# Patient Record
Sex: Male | Born: 1940
Health system: Southern US, Community
[De-identification: ages and names within clinical notes are randomized; demographics above are authoritative.]

## PROBLEM LIST (undated history)

## (undated) DIAGNOSIS — I10 Essential (primary) hypertension: Secondary | ICD-10-CM

## (undated) DIAGNOSIS — E6609 Other obesity due to excess calories: Secondary | ICD-10-CM

## (undated) DIAGNOSIS — E11319 Type 2 diabetes mellitus with unspecified diabetic retinopathy without macular edema: Secondary | ICD-10-CM

## (undated) DIAGNOSIS — Q2112 Patent foramen ovale: Secondary | ICD-10-CM

## (undated) DIAGNOSIS — Z8679 Personal history of other diseases of the circulatory system: Secondary | ICD-10-CM

## (undated) DIAGNOSIS — I350 Nonrheumatic aortic (valve) stenosis: Principal | ICD-10-CM

## (undated) DIAGNOSIS — N200 Calculus of kidney: Secondary | ICD-10-CM

## (undated) DIAGNOSIS — Q211 Atrial septal defect: Secondary | ICD-10-CM

## (undated) DIAGNOSIS — Z953 Presence of xenogenic heart valve: Secondary | ICD-10-CM

## (undated) DIAGNOSIS — E538 Deficiency of other specified B group vitamins: Secondary | ICD-10-CM

## (undated) DIAGNOSIS — A6920 Lyme disease, unspecified: Secondary | ICD-10-CM

## (undated) DIAGNOSIS — K219 Gastro-esophageal reflux disease without esophagitis: Secondary | ICD-10-CM

## (undated) DIAGNOSIS — Z8619 Personal history of other infectious and parasitic diseases: Secondary | ICD-10-CM

## (undated) DIAGNOSIS — N3941 Urge incontinence: Secondary | ICD-10-CM

## (undated) DIAGNOSIS — I4819 Other persistent atrial fibrillation: Secondary | ICD-10-CM

## (undated) DIAGNOSIS — I2510 Atherosclerotic heart disease of native coronary artery without angina pectoris: Secondary | ICD-10-CM

## (undated) DIAGNOSIS — I5032 Chronic diastolic (congestive) heart failure: Secondary | ICD-10-CM

## (undated) DIAGNOSIS — G709 Myoneural disorder, unspecified: Secondary | ICD-10-CM

## (undated) DIAGNOSIS — Z9889 Other specified postprocedural states: Secondary | ICD-10-CM

## (undated) HISTORY — DX: Calculus of kidney: N20.0

## (undated) HISTORY — DX: Deficiency of other specified B group vitamins: E53.8

## (undated) HISTORY — PX: VASECTOMY: SHX75

## (undated) HISTORY — PX: OTHER SURGICAL HISTORY: SHX169

## (undated) HISTORY — DX: Urge incontinence: N39.41

## (undated) HISTORY — DX: Patent foramen ovale: Q21.12

## (undated) HISTORY — DX: Lyme disease, unspecified: A69.20

## (undated) HISTORY — DX: Other obesity due to excess calories: E66.09

## (undated) HISTORY — PX: EYE SURGERY: SHX253

## (undated) HISTORY — DX: Essential (primary) hypertension: I10

## (undated) HISTORY — DX: Other persistent atrial fibrillation: I48.19

## (undated) HISTORY — DX: Type 2 diabetes mellitus with unspecified diabetic retinopathy without macular edema: E11.319

## (undated) HISTORY — PX: BACK SURGERY: SHX140

## (undated) HISTORY — DX: Atrial septal defect: Q21.1

## (undated) HISTORY — DX: Nonrheumatic aortic (valve) stenosis: I35.0

## (undated) HISTORY — DX: Chronic diastolic (congestive) heart failure: I50.32

## (undated) HISTORY — DX: Personal history of other infectious and parasitic diseases: Z86.19

## (undated) HISTORY — PX: COLONOSCOPY: SHX174

---

## 1997-09-04 ENCOUNTER — Other Ambulatory Visit: Admission: RE | Admit: 1997-09-04 | Discharge: 1997-09-04 | Payer: Self-pay | Admitting: Family Medicine

## 1998-09-28 ENCOUNTER — Inpatient Hospital Stay (HOSPITAL_COMMUNITY): Admission: RE | Admit: 1998-09-28 | Discharge: 1998-09-29 | Payer: Self-pay | Admitting: Orthopaedic Surgery

## 2000-09-28 LAB — HM COLONOSCOPY

## 2002-04-20 ENCOUNTER — Encounter: Admission: RE | Admit: 2002-04-20 | Discharge: 2002-04-20 | Payer: Self-pay | Admitting: Family Medicine

## 2002-04-20 ENCOUNTER — Encounter: Payer: Self-pay | Admitting: Family Medicine

## 2004-09-08 ENCOUNTER — Ambulatory Visit: Payer: Self-pay | Admitting: Family Medicine

## 2005-01-31 ENCOUNTER — Ambulatory Visit: Payer: Self-pay | Admitting: Family Medicine

## 2005-07-29 ENCOUNTER — Emergency Department (HOSPITAL_COMMUNITY): Admission: EM | Admit: 2005-07-29 | Discharge: 2005-07-29 | Payer: Self-pay | Admitting: Emergency Medicine

## 2005-10-11 ENCOUNTER — Ambulatory Visit: Payer: Self-pay | Admitting: Family Medicine

## 2006-04-17 ENCOUNTER — Ambulatory Visit: Payer: Self-pay | Admitting: Family Medicine

## 2006-04-17 LAB — CONVERTED CEMR LAB
ALT: 22 units/L (ref 0–40)
AST: 31 units/L (ref 0–37)
BUN: 25 mg/dL — ABNORMAL HIGH (ref 6–23)
CO2: 25 meq/L (ref 19–32)
Calcium: 10.6 mg/dL — ABNORMAL HIGH (ref 8.4–10.5)
Chloride: 109 meq/L (ref 96–112)
Chol/HDL Ratio, serum: 4.5
Cholesterol: 233 mg/dL (ref 0–200)
Creatinine, Ser: 1.1 mg/dL (ref 0.4–1.5)
Creatinine,U: 115.7 mg/dL
GFR calc non Af Amer: 71 mL/min
Glomerular Filtration Rate, Af Am: 86 mL/min/{1.73_m2}
Glucose, Bld: 109 mg/dL — ABNORMAL HIGH (ref 70–99)
HDL: 52.1 mg/dL (ref >39.0)
Hgb A1c MFr Bld: 7.2 % — ABNORMAL HIGH (ref 4.6–6.0)
LDL DIRECT: 173.7 mg/dL
Microalb Creat Ratio: 10.4 mg/g (ref 0.0–30.0)
Microalb, Ur: 1.2 mg/dL (ref 0.0–1.9)
PSA: 1.12 ng/mL (ref 0.10–4.00)
Potassium: 5.3 meq/L — ABNORMAL HIGH (ref 3.5–5.1)
Rheumatoid Fact: <20 intl units/mL — ABNORMAL LOW (ref 0.0–20.0)
Sed Rate: 21 mm/hr — ABNORMAL HIGH (ref 0–20)
Sodium: 142 meq/L (ref 135–145)
TSH: 2.25 microintl units/mL (ref 0.35–5.50)
Triglyceride fasting, serum: 71 mg/dL (ref 0–149)
VLDL: 14 mg/dL (ref 0–40)

## 2006-05-11 ENCOUNTER — Ambulatory Visit: Payer: Self-pay | Admitting: Family Medicine

## 2006-12-03 DIAGNOSIS — I10 Essential (primary) hypertension: Secondary | ICD-10-CM | POA: Insufficient documentation

## 2007-04-23 ENCOUNTER — Ambulatory Visit: Payer: Self-pay | Admitting: Family Medicine

## 2007-04-23 DIAGNOSIS — E113599 Type 2 diabetes mellitus with proliferative diabetic retinopathy without macular edema, unspecified eye: Secondary | ICD-10-CM | POA: Insufficient documentation

## 2007-04-23 DIAGNOSIS — D649 Anemia, unspecified: Secondary | ICD-10-CM | POA: Insufficient documentation

## 2007-04-23 DIAGNOSIS — E785 Hyperlipidemia, unspecified: Secondary | ICD-10-CM | POA: Insufficient documentation

## 2007-04-23 DIAGNOSIS — T50995A Adverse effect of other drugs, medicaments and biological substances, initial encounter: Secondary | ICD-10-CM | POA: Insufficient documentation

## 2007-04-23 LAB — CONVERTED CEMR LAB
Bilirubin Urine: NEGATIVE
Blood in Urine, dipstick: NEGATIVE
Glucose, Urine, Semiquant: NEGATIVE
Nitrite: NEGATIVE
Specific Gravity, Urine: 1.02
Urobilinogen, UA: 0.2
WBC Urine, dipstick: NEGATIVE
pH: 5.5

## 2007-05-08 ENCOUNTER — Telehealth: Payer: Self-pay | Admitting: Family Medicine

## 2007-05-09 LAB — CONVERTED CEMR LAB
ALT: 17 units/L (ref 0–53)
AST: 27 units/L (ref 0–37)
Albumin: 3.8 g/dL (ref 3.5–5.2)
Alkaline Phosphatase: 41 units/L (ref 39–117)
BUN: 15 mg/dL (ref 6–23)
Basophils Absolute: 0 10*3/uL (ref 0.0–0.1)
Basophils Relative: 0.2 % (ref 0.0–1.0)
Bilirubin, Direct: 0.1 mg/dL (ref 0.0–0.3)
CO2: 27 meq/L (ref 19–32)
Calcium: 10.1 mg/dL (ref 8.4–10.5)
Chloride: 102 meq/L (ref 96–112)
Cholesterol: 193 mg/dL (ref 0–200)
Creatinine, Ser: 0.9 mg/dL (ref 0.4–1.5)
Creatinine,U: 106.5 mg/dL
Eosinophils Absolute: 0.2 10*3/uL (ref 0.0–0.6)
Eosinophils Relative: 3.7 % (ref 0.0–5.0)
GFR calc Af Amer: 108 mL/min
GFR calc non Af Amer: 89 mL/min
Glucose, Bld: 115 mg/dL — ABNORMAL HIGH (ref 70–99)
HCT: 35.5 % — ABNORMAL LOW (ref 39.0–52.0)
HDL: 58.2 mg/dL (ref >39.0)
Hemoglobin: 11.7 g/dL — ABNORMAL LOW (ref 13.0–17.0)
Hgb A1c MFr Bld: 6.7 % — ABNORMAL HIGH (ref 4.6–6.0)
LDL Cholesterol: 126 mg/dL — ABNORMAL HIGH (ref 0–99)
Lymphocytes Relative: 19.4 % (ref 12.0–46.0)
MCHC: 33 g/dL (ref 30.0–36.0)
MCV: 91.2 fL (ref 78.0–100.0)
Microalb Creat Ratio: 181.2 mg/g — ABNORMAL HIGH (ref 0.0–30.0)
Microalb, Ur: 19.3 mg/dL — ABNORMAL HIGH (ref 0.0–1.9)
Monocytes Absolute: 0.7 10*3/uL (ref 0.2–0.7)
Monocytes Relative: 13.9 % — ABNORMAL HIGH (ref 3.0–11.0)
Neutro Abs: 2.9 10*3/uL (ref 1.4–7.7)
Neutrophils Relative %: 62.8 % (ref 43.0–77.0)
PSA: 1.13 ng/mL (ref 0.10–4.00)
Platelets: 269 10*3/uL (ref 150–400)
Potassium: 4.8 meq/L (ref 3.5–5.1)
RBC: 3.89 M/uL — ABNORMAL LOW (ref 4.22–5.81)
RDW: 13 % (ref 11.5–14.6)
Sodium: 139 meq/L (ref 135–145)
TSH: 3.19 microintl units/mL (ref 0.35–5.50)
Total Bilirubin: 0.6 mg/dL (ref 0.3–1.2)
Total CHOL/HDL Ratio: 3.3
Total Protein: 6.5 g/dL (ref 6.0–8.3)
Triglycerides: 42 mg/dL (ref 0–149)
VLDL: 8 mg/dL (ref 0–40)
WBC: 4.7 10*3/uL (ref 4.5–10.5)

## 2007-05-13 ENCOUNTER — Ambulatory Visit: Payer: Self-pay | Admitting: Family Medicine

## 2007-05-13 LAB — CONVERTED CEMR LAB: Whiff Test: POSITIVE

## 2007-05-15 ENCOUNTER — Encounter: Payer: Self-pay | Admitting: Family Medicine

## 2007-05-31 ENCOUNTER — Telehealth: Payer: Self-pay | Admitting: Family Medicine

## 2007-06-07 ENCOUNTER — Encounter: Payer: Self-pay | Admitting: Family Medicine

## 2007-06-10 ENCOUNTER — Telehealth: Payer: Self-pay | Admitting: Family Medicine

## 2007-06-12 ENCOUNTER — Telehealth: Payer: Self-pay | Admitting: Family Medicine

## 2007-07-25 ENCOUNTER — Telehealth: Payer: Self-pay | Admitting: Family Medicine

## 2008-02-03 ENCOUNTER — Encounter: Payer: Self-pay | Admitting: Family Medicine

## 2008-02-16 ENCOUNTER — Encounter: Payer: Self-pay | Admitting: Family Medicine

## 2008-06-03 ENCOUNTER — Telehealth: Payer: Self-pay | Admitting: Family Medicine

## 2008-06-03 ENCOUNTER — Ambulatory Visit: Payer: Self-pay | Admitting: Family Medicine

## 2008-06-03 DIAGNOSIS — E669 Obesity, unspecified: Secondary | ICD-10-CM

## 2008-06-03 DIAGNOSIS — N400 Enlarged prostate without lower urinary tract symptoms: Secondary | ICD-10-CM | POA: Insufficient documentation

## 2008-06-03 DIAGNOSIS — E663 Overweight: Secondary | ICD-10-CM | POA: Insufficient documentation

## 2008-06-03 DIAGNOSIS — E559 Vitamin D deficiency, unspecified: Secondary | ICD-10-CM | POA: Insufficient documentation

## 2008-06-03 DIAGNOSIS — Z87898 Personal history of other specified conditions: Secondary | ICD-10-CM

## 2008-06-03 DIAGNOSIS — R768 Other specified abnormal immunological findings in serum: Secondary | ICD-10-CM | POA: Insufficient documentation

## 2008-06-03 DIAGNOSIS — R609 Edema, unspecified: Secondary | ICD-10-CM | POA: Insufficient documentation

## 2008-06-11 ENCOUNTER — Ambulatory Visit: Payer: Self-pay | Admitting: Family Medicine

## 2008-06-11 LAB — CONVERTED CEMR LAB: OCCULT 2: NEGATIVE

## 2008-06-25 ENCOUNTER — Encounter: Payer: Self-pay | Admitting: Family Medicine

## 2008-09-24 ENCOUNTER — Telehealth: Payer: Self-pay

## 2008-10-08 ENCOUNTER — Telehealth: Payer: Self-pay | Admitting: Family Medicine

## 2008-10-12 ENCOUNTER — Ambulatory Visit: Payer: Self-pay | Admitting: Family Medicine

## 2008-10-30 LAB — CONVERTED CEMR LAB
Basophils Relative: 0.3 % (ref 0.0–3.0)
Eosinophils Relative: 0.9 % (ref 0.0–5.0)
HCT: 38.7 % — ABNORMAL LOW (ref 39.0–52.0)
Hemoglobin: 13.3 g/dL (ref 13.0–17.0)
Lymphs Abs: 0.6 10*3/uL — ABNORMAL LOW (ref 0.7–4.0)
MCV: 97.8 fL (ref 78.0–100.0)
Monocytes Absolute: 0.4 10*3/uL (ref 0.1–1.0)
Monocytes Relative: 7 % (ref 3.0–12.0)
RBC: 3.96 M/uL — ABNORMAL LOW (ref 4.22–5.81)
WBC: 6 10*3/uL (ref 4.5–10.5)

## 2008-12-03 ENCOUNTER — Ambulatory Visit: Payer: Self-pay | Admitting: Family Medicine

## 2008-12-04 ENCOUNTER — Encounter: Payer: Self-pay | Admitting: Family Medicine

## 2008-12-08 ENCOUNTER — Telehealth: Payer: Self-pay | Admitting: Family Medicine

## 2009-02-03 ENCOUNTER — Encounter: Payer: Self-pay | Admitting: Family Medicine

## 2009-06-01 ENCOUNTER — Encounter: Payer: Self-pay | Admitting: Family Medicine

## 2009-06-03 ENCOUNTER — Telehealth: Payer: Self-pay | Admitting: Family Medicine

## 2009-06-09 ENCOUNTER — Ambulatory Visit: Payer: Self-pay | Admitting: Family Medicine

## 2009-06-09 DIAGNOSIS — R350 Frequency of micturition: Secondary | ICD-10-CM | POA: Insufficient documentation

## 2009-06-09 LAB — CONVERTED CEMR LAB
Blood in Urine, dipstick: NEGATIVE
Nitrite: NEGATIVE
Protein, U semiquant: NEGATIVE
WBC Urine, dipstick: NEGATIVE
pH: 5

## 2009-06-12 LAB — CONVERTED CEMR LAB
ALT: 23 units/L (ref 0–53)
AST: 27 units/L (ref 0–37)
Alkaline Phosphatase: 44 units/L (ref 39–117)
BUN: 17 mg/dL (ref 6–23)
Basophils Absolute: 0 10*3/uL (ref 0.0–0.1)
Bilirubin, Direct: 0.1 mg/dL (ref 0.0–0.3)
Calcium: 9.8 mg/dL (ref 8.4–10.5)
Creatinine,U: 191.8 mg/dL
Direct LDL: 130.9 mg/dL
Eosinophils Relative: 2.2 % (ref 0.0–5.0)
GFR calc non Af Amer: 63.51 mL/min (ref >60)
Glucose, Bld: 124 mg/dL — ABNORMAL HIGH (ref 70–99)
HCT: 40.1 % (ref 39.0–52.0)
HDL: 69.2 mg/dL (ref >39.00)
Lymphocytes Relative: 16.5 % (ref 12.0–46.0)
Lymphs Abs: 0.6 10*3/uL — ABNORMAL LOW (ref 0.7–4.0)
Microalb Creat Ratio: 7.3 mg/g (ref 0.0–30.0)
Monocytes Relative: 12.5 % — ABNORMAL HIGH (ref 3.0–12.0)
Neutrophils Relative %: 67.8 % (ref 43.0–77.0)
PSA: 1.99 ng/mL (ref 0.10–4.00)
Platelets: 199 10*3/uL (ref 150.0–400.0)
Potassium: 5.2 meq/L — ABNORMAL HIGH (ref 3.5–5.1)
RDW: 12.6 % (ref 11.5–14.6)
Sodium: 136 meq/L (ref 135–145)
TSH: 2.08 microintl units/mL (ref 0.35–5.50)
Total Bilirubin: 1 mg/dL (ref 0.3–1.2)
Triglycerides: 52 mg/dL (ref 0.0–149.0)
VLDL: 10.4 mg/dL (ref 0.0–40.0)
Vit D, 25-Hydroxy: 17 ng/mL — ABNORMAL LOW (ref 30–89)
WBC: 3.6 10*3/uL — ABNORMAL LOW (ref 4.5–10.5)

## 2009-06-16 ENCOUNTER — Encounter: Payer: Self-pay | Admitting: Family Medicine

## 2009-06-24 ENCOUNTER — Ambulatory Visit: Payer: Self-pay | Admitting: Family Medicine

## 2009-07-12 LAB — CONVERTED CEMR LAB: OCCULT 1: NEGATIVE

## 2009-07-20 ENCOUNTER — Telehealth: Payer: Self-pay | Admitting: Family Medicine

## 2010-02-02 ENCOUNTER — Ambulatory Visit: Payer: Self-pay | Admitting: Family Medicine

## 2010-02-02 DIAGNOSIS — B079 Viral wart, unspecified: Secondary | ICD-10-CM | POA: Insufficient documentation

## 2010-02-04 ENCOUNTER — Encounter: Payer: Self-pay | Admitting: Family Medicine

## 2010-03-02 ENCOUNTER — Encounter: Payer: Self-pay | Admitting: Family Medicine

## 2010-03-07 ENCOUNTER — Encounter: Payer: Self-pay | Admitting: Family Medicine

## 2010-05-22 DIAGNOSIS — A6920 Lyme disease, unspecified: Secondary | ICD-10-CM

## 2010-05-22 HISTORY — DX: Lyme disease, unspecified: A69.20

## 2010-05-31 ENCOUNTER — Telehealth: Payer: Self-pay | Admitting: Internal Medicine

## 2010-06-19 LAB — CONVERTED CEMR LAB
ALT: 22 units/L (ref 0–53)
AST: 31 units/L (ref 0–37)
Albumin: 3.9 g/dL (ref 3.5–5.2)
Alkaline Phosphatase: 36 units/L — ABNORMAL LOW (ref 39–117)
BUN: 20 mg/dL (ref 6–23)
Basophils Absolute: 0 10*3/uL (ref 0.0–0.1)
Basophils Relative: 0.2 % (ref 0.0–3.0)
Bilirubin Urine: NEGATIVE
Bilirubin, Direct: 0.1 mg/dL (ref 0.0–0.3)
Blood in Urine, dipstick: NEGATIVE
CO2: 28 meq/L (ref 19–32)
Calcium: 10.3 mg/dL (ref 8.4–10.5)
Chloride: 102 meq/L (ref 96–112)
Cholesterol: 181 mg/dL (ref 0–200)
Creatinine, Ser: 1.1 mg/dL (ref 0.4–1.5)
Creatinine,U: 127.8 mg/dL
Eosinophils Absolute: 0.1 10*3/uL (ref 0.0–0.7)
Eosinophils Relative: 2.4 % (ref 0.0–5.0)
GFR calc Af Amer: 85 mL/min
GFR calc non Af Amer: 71 mL/min
Glucose, Bld: 85 mg/dL (ref 70–99)
Glucose, Urine, Semiquant: NEGATIVE
HCT: 33.8 % — ABNORMAL LOW (ref 39.0–52.0)
HDL: 68.3 mg/dL (ref >39.0)
Hemoglobin: 11.3 g/dL — ABNORMAL LOW (ref 13.0–17.0)
Hgb A1c MFr Bld: 6.8 % — ABNORMAL HIGH (ref 4.6–6.0)
LDL Cholesterol: 105 mg/dL — ABNORMAL HIGH (ref 0–99)
Lymphocytes Relative: 18.9 % (ref 12.0–46.0)
MCHC: 33.4 g/dL (ref 30.0–36.0)
MCV: 96.1 fL (ref 78.0–100.0)
Microalb Creat Ratio: 6.3 mg/g (ref 0.0–30.0)
Microalb, Ur: 0.8 mg/dL (ref 0.0–1.9)
Monocytes Absolute: 0.6 10*3/uL (ref 0.1–1.0)
Monocytes Relative: 14.9 % — ABNORMAL HIGH (ref 3.0–12.0)
Neutro Abs: 2.7 10*3/uL (ref 1.4–7.7)
Neutrophils Relative %: 63.6 % (ref 43.0–77.0)
Nitrite: NEGATIVE
PSA: 1.32 ng/mL (ref 0.10–4.00)
Platelets: 253 10*3/uL (ref 150–400)
Potassium: 4.4 meq/L (ref 3.5–5.1)
Protein, U semiquant: NEGATIVE
RBC: 3.52 M/uL — ABNORMAL LOW (ref 4.22–5.81)
RDW: 12.9 % (ref 11.5–14.6)
Sodium: 138 meq/L (ref 135–145)
Specific Gravity, Urine: 1.015
TSH: 1.88 microintl units/mL (ref 0.35–5.50)
Total Bilirubin: 0.8 mg/dL (ref 0.3–1.2)
Total CHOL/HDL Ratio: 2.7
Total Protein: 6.9 g/dL (ref 6.0–8.3)
Triglycerides: 38 mg/dL (ref 0–149)
Urobilinogen, UA: 0.2
VLDL: 8 mg/dL (ref 0–40)
WBC Urine, dipstick: NEGATIVE
WBC: 4.2 10*3/uL — ABNORMAL LOW (ref 4.5–10.5)
pH: 6

## 2010-06-23 NOTE — Medication Information (Signed)
Summary: Order for Diabetic Testing Supplies  Order for Diabetic Testing Supplies   Imported By: Maryln Gottron 03/07/2010 10:35:19  _____________________________________________________________________  External Attachment:    Type:   Image     Comment:   External Document

## 2010-06-23 NOTE — Letter (Signed)
Summary: [facility]  [employer]   Imported By: Maryln Gottron 06/17/2009 15:25:40  _____________________________________________________________________  External Attachment:    Type:   Image     Comment:   External Document

## 2010-06-23 NOTE — Progress Notes (Signed)
Summary: approval to give blood.  Phone Note Call from Patient   Caller: Patient Call For: Judithann Sheen MD Summary of Call: Requesting Almira Coaster to send a fax to [facility] [phone] .  Needs approval to give blood since false positive of STD.  Initial call taken by: Lynann Beaver CMA,  June 03, 2009 11:11 AM  Follow-up for Phone Call        pt in today for cpx so will take care of letter Follow-up by: Pura Spice, RN,  June 09, 2009 8:48 AM     Appended Document: approval to give blood. Jun 16 2009 letter submitted and faxed at pt request regarding lab test......gh.rn......Marland Kitchen

## 2010-06-23 NOTE — Medication Information (Signed)
Summary: Order for Diabetic Testing Supplies  Order for Diabetic Testing Supplies   Imported By: Maryln Gottron 03/08/2010 15:05:01  _____________________________________________________________________  External Attachment:    Type:   Image     Comment:   External Document

## 2010-06-23 NOTE — Assessment & Plan Note (Signed)
Summary: consult re: wart removal/cjr   Vital Signs:  Patient profile:   70 year old male Height:      73 inches (185.42 cm) Weight:      306 pounds (139.09 kg) O2 Sat:      97 % on Room air Temp:     97.7 degrees F (36.50 degrees C) oral Pulse rate:   97 / minute BP sitting:   138 / 64  (left arm) Cuff size:   large  Vitals Entered By: Josph Macho RMA (February 02, 2010 8:52 AM)  O2 Flow:  Room air CC: Wart removal on left forearm- pt has been trying Compound W for 5-6 weeks/ CF Is Patient Diabetic? Yes   History of Present Illness: Patient in today with a lesion on his left forearm he has had for the last couple of months. He has been applying Compound W daily for the past 5-6 weeks and it is not resolving. No bleeding/pain/growth noted.  No other c/o today. His Blood Sugar this am was 81, which is a typical am number for him. Denies any f/c/malaise/CP/palp/SOB/GI or GU c/o.  Current Medications (verified): 1)  Enalapril Maleate 10 Mg  Tabs (Enalapril Maleate) .... Once Daily 2)  Oxybutynin Chloride 15 Mg  Tb24 (Oxybutynin Chloride) .... Once Daily 3)  Ibuprofen 800 Mg  Tabs (Ibuprofen) .Marland Kitchen.. 1 Tablet Three Times A Day After Meals 4)  Triamterene-Hctz 75-50 Mg Tabs (Triamterene-Hctz) .Marland Kitchen.. 1 Once Daily  For Edema 5)  Glipizide 10 Mg Tabs (Glipizide) .Marland Kitchen.. 1 By Mouth Two Times A Day 6)  Tandem Plus 162-115.2-1 Mg Caps (Fefum-Fepo-Fa-B Cmp-C-Zn-Mn-Cu) .Marland Kitchen.. 1 By Mouth Once Daily 7)  Vitamin D (Ergocalciferol) 50000 Unit Caps (Ergocalciferol) .Marland Kitchen.. 1 Weekly X 12 8)  Vesicare 5 Mg Tabs (Solifenacin Succinate) .... One Q. Day 9)  Metformin Hcl 1000 Mg Tabs (Metformin Hcl) .Marland Kitchen.. 1 Bid  Allergies (verified): No Known Drug Allergies  Past History:  Past medical history reviewed for relevance to current acute and chronic problems. Social history (including risk factors) reviewed for relevance to current acute and chronic problems.  Past Medical History: Reviewed history from  06/03/2008 and no changes required. Hypertension DM   ekg   2010 eye exam colonscopy   2002  due  2012 DT PNEUNOVAX   2007 SHINGLES VACCINE SMOKER   never    Social History: Reviewed history from 12/03/2006 and no changes required. Alcohol use-no  Review of Systems      See HPI       Flu Vaccine Consent Questions     Do you have a history of severe allergic reactions to this vaccine? no    Any prior history of allergic reactions to egg and/or gelatin? no    Do you have a sensitivity to the preservative Thimersol? no    Do you have a past history of Guillan-Barre Syndrome? no    Do you currently have an acute febrile illness? no    Have you ever had a severe reaction to latex? no    Vaccine information given and explained to patient? yes    Are you currently pregnant? no    Lot Number:[id number]   Exp Date:11/19/2010   Site Given  Left Deltoid IM Josph Macho RMA  February 02, 2010 9:15 AM   Physical Exam  General:  Well-developed,well-nourished,in no acute distress; alert,appropriate and cooperative throughout examination. Obese Lungs:  Normal respiratory effort, chest expands symmetrically. Lungs are clear to auscultation, no crackles or wheezes. Heart:  Normal rate and regular rhythm. S1 and S2 normal without gallop, murmur, click, rub or other extra sounds. Abdomen:  Bowel sounds positive,abdomen soft and non-tender without masses, organomegaly or hernias noted. Extremities:  No clubbing, cyanosis, edema, or deformity noted  Skin:  Left forearm 1 cm verrous lesion with scab on top no surrounding fluctuance Psych:  Cognition and judgment appear intact. Alert and cooperative with normal attention span and concentration. No apparent delusions, illusions, hallucinations   Impression & Recommendations:  Problem # 1:  VERRUCA VULGARIS (ICD-078.10)  Left arm, area cleansed with alcohol, lesion debrided slightly with sterile 11 blade and then cryotherapy was applied,  patient tolerated procedure well. Will try Tylenol or NSAID for pain relief as needed. After blistering resolves recommend continue to apply Cound W and duct tape daily til seen in 3 weeks for re cryotherapy if lesion persists  Orders: Cryotherapy/Destruction benign or premalignant lesion (1st lesion)  (17000)  Problem # 2:  DIABETES MELLITUS, TYPE II (ICD-250.00)  His updated medication list for this problem includes:    Enalapril Maleate 10 Mg Tabs (Enalapril maleate) ..... Once daily    Glipizide 10 Mg Tabs (Glipizide) .Marland Kitchen... 1 by mouth two times a day    Metformin Hcl 1000 Mg Tabs (Metformin hcl) .Marland Kitchen... 1 bid Well controlled  Complete Medication List: 1)  Enalapril Maleate 10 Mg Tabs (Enalapril maleate) .... Once daily 2)  Oxybutynin Chloride 15 Mg Tb24 (Oxybutynin chloride) .... Once daily 3)  Ibuprofen 800 Mg Tabs (Ibuprofen) .Marland Kitchen.. 1 tablet three times a day after meals 4)  Triamterene-hctz 75-50 Mg Tabs (Triamterene-hctz) .Marland Kitchen.. 1 once daily  for edema 5)  Glipizide 10 Mg Tabs (Glipizide) .Marland Kitchen.. 1 by mouth two times a day 6)  Tandem Plus 162-115.2-1 Mg Caps (Fefum-fepo-fa-b cmp-c-zn-mn-cu) .Marland Kitchen.. 1 by mouth once daily 7)  Vitamin D (ergocalciferol) 50000 Unit Caps (Ergocalciferol) .Marland KitchenMarland KitchenMarland Kitchen 1 weekly x 12 8)  Vesicare 5 Mg Tabs (Solifenacin succinate) .... One q. day 9)  Metformin Hcl 1000 Mg Tabs (Metformin hcl) .Marland Kitchen.. 1 bid  Other Orders: Flu Vaccine 8yrs + MEDICARE PATIENTS (Z6109) Administration Flu vaccine - MCR (U0454)  Patient Instructions: 1)  Please schedule a follow-up appointment in 3 weeks.  2)  Take 650 - 1000 mg of tylenol every 4-6 hours as needed for relief of pain or comfort of fever. Avoid taking more than 3000 mg in a 24 hour period( can cause liver damage in higher doses).  3)  Consider trying Naproxen (Aleve) in replacement of Ibuprofen for pain. Tabs are 220mg  and can take 1 tab by mouth two times a day as needed pain with food

## 2010-06-23 NOTE — Letter (Signed)
Summary: Eye Exam/[facility]  Eye Exam/[facility]   Imported By: Maryln Gottron 04/06/2010 13:54:04  _____________________________________________________________________  External Attachment:    Type:   Image     Comment:   External Document

## 2010-06-23 NOTE — Progress Notes (Signed)
Summary: refill request  Phone Note Refill Request Call back at Home Phone [phone] Message from:  Patient--walk in  Refills Requested: Medication #1:  ENALAPRIL MALEATE 10 MG  TABS once daily  Medication #2:  GLIPIZIDE 10 MG TABS 1 by mouth two times a day  Medication #3:  TRIAMTERENE-HCTZ 75-50 MG TABS 1 once daily  for edema  Medication #4:  OXYBUTYNIN CHLORIDE 15 MG  TB24 once daily pt needs refills  Next Appointment Scheduled: 08/18/2010 Initial call taken by: Georgian Co,  May 31, 2010 3:02 PM  Follow-up for Phone Call        Rx called to pharmacy Follow-up by: Alfred Levins, CMA,  May 31, 2010 3:06 PM    Prescriptions: GLIPIZIDE 10 MG TABS (GLIPIZIDE) 1 by mouth two times a day  #180 x 3   Entered by:   Alfred Levins, CMA   Authorized by:   Judithann Sheen MD   Signed by:   Alfred Levins, CMA on 05/31/2010   Method used:   Electronically to        [facility]  [phone]* (retail)       [address]       [place], [place]  [address]       Ph: [phone] or [phone]       Fax: [phone]   RxID:   [id number] ENALAPRIL MALEATE 10 MG  TABS (ENALAPRIL MALEATE) once daily  #180 x 3   Entered by:   Alfred Levins, CMA   Authorized by:   Judithann Sheen MD   Signed by:   Alfred Levins, CMA on 05/31/2010   Method used:   Electronically to        [facility]  [phone]* (retail)       [address]       [place], [place]  [address]       Ph: [phone] or [phone]       Fax: [phone]   RxID:   [id number] OXYBUTYNIN CHLORIDE 15 MG  TB24 (OXYBUTYNIN CHLORIDE) once daily  #90 x 3   Entered by:   Alfred Levins, CMA   Authorized by:   Judithann Sheen MD   Signed by:   Alfred Levins, CMA on 05/31/2010   Method used:   Electronically to        [facility]  [phone]* (retail)       [address]       [place], [place]   [address]       Ph: [phone] or [phone]       Fax: [phone]   RxID:   [id number] TRIAMTERENE-HCTZ 75-50 MG TABS (TRIAMTERENE-HCTZ) 1 once daily  for edema  #90 x 3   Entered by:   Alfred Levins, CMA   Authorized by:   Judithann Sheen MD   Signed by:   Alfred Levins, CMA on 05/31/2010   Method used:   Electronically to        [facility]  [phone]* (retail)       [address]       [place], [place]  [address]       Ph: [phone] or [phone]       Fax: [phone]   RxID:  [id number]  

## 2010-06-23 NOTE — Progress Notes (Signed)
Summary: med to expensive   Phone Note Call from Patient Call back at Home Phone [phone] Call back at [phone]   Caller: Patient Call For: Judithann Sheen MD Summary of Call: pt avandamet has increase to 245.00 for 3 month supply, pt would like tier exception request form please call [phone] [facility]. gina pt  Initial call taken by: Heron Sabins,  July 20, 2009 10:07 AM  Follow-up for Phone Call        pls ask him what is on his formulary per his insurance plan  dr Scotty Court can switch his med but need to know what is covered.  Follow-up by: Pura Spice, RN,  July 20, 2009 10:41 AM  Additional Follow-up for Phone Call Additional follow up Details #1::        metformin hcl or glizipide hcl  Additional Follow-up by: Heron Sabins,  July 20, 2009 1:35 PM

## 2010-06-23 NOTE — Assessment & Plan Note (Signed)
Summary: emp/pt fasting/cjr   Vital Signs:  Patient profile:   70 year old male Height:      73 inches Weight:      305 pounds BMI:     40.39 O2 Sat:      95 % Temp:     97.9 degrees F Pulse rate:   86 / minute Pulse rhythm:   regular BP sitting:   140 / 82  (left arm) Cuff size:   large  Vitals Entered By: Pura Spice, RN (June 09, 2009 8:52 AM) CC: go over problems fasting for labs wants letter stating ok to donate blood.  requested flu vaccine  Is Patient Diabetic? Yes Did you bring your meter with you today? No   History of Present Illness: This 70 year old white male is into discuss his medical problems and refill appropriate medications. He needs a letter to the [employer] stating he does not have syphilis so that he can donate blood. He continues to maintain his weight, diabetes stable CBGs 120 to 1:30 and doing fine He complains he does have some pain in the right heel and right knee in the early a.m. but this improves with activity and he relates he does not need any medication for this problem. He thinks the oxybutynin is not as effective for urinary urgency and has been in the past and we have discussed trying VESIcare samples He continues to be active, good appetite and sleeps well Hypertension is stable EKG in 2010 was normal, colonoscopic exam up-to-date  Allergies (verified): No Known Drug Allergies  Past History:  Past Medical History: Last updated: 06/03/2008 Hypertension DM   ekg   2010 eye exam colonscopy   2002  due  2012 DT PNEUNOVAX   2007 SHINGLES VACCINE SMOKER   never    Past Surgical History: Last updated: 12/03/2006 Kidney stone removal Finger sx Colonoscopy-5/102002  Social History: Last updated: 12/03/2006 Alcohol use-no  Review of Systems  The patient denies anorexia, fever, weight loss, weight gain, vision loss, decreased hearing, hoarseness, chest pain, syncope, dyspnea on exertion, peripheral edema, prolonged cough,  headaches, hemoptysis, abdominal pain, melena, hematochezia, severe indigestion/heartburn, hematuria, incontinence, genital sores, muscle weakness, suspicious skin lesions, transient blindness, difficulty walking, depression, unusual weight change, abnormal bleeding, enlarged lymph nodes, angioedema, breast masses, and testicular masses.    Physical Exam  General:  Well-developed,well-nourished,in no acute distress; alert,appropriate and cooperative throughout examinationoverweight-appearing.   Head:  Normocephalic and atraumatic without obvious abnormalities. No apparent alopecia or balding. Eyes:  No corneal or conjunctival inflammation noted. EOMI. Perrla. Funduscopic exam benign, without hemorrhages, exudates or papilledema. Vision grossly normal. Ears:  External ear exam shows no significant lesions or deformities.  Otoscopic examination reveals clear canals, tympanic membranes are intact bilaterally without bulging, retraction, inflammation or discharge. Hearing is grossly normal bilaterally. Nose:  External nasal examination shows no deformity or inflammation. Nasal mucosa are pink and moist without lesions or exudates. Mouth:  Oral mucosa and oropharynx without lesions or exudates.  Teeth in good repair. Neck:  No deformities, masses, or tenderness noted. Chest Wall:  No deformities, masses, tenderness or gynecomastia noted. Breasts:  No masses or gynecomastia noted Lungs:  Normal respiratory effort, chest expands symmetrically. Lungs are clear to auscultation, no crackles or wheezes. Heart:  Normal rate and regular rhythm. S1 and S2 normal without gallop, murmur, click, rub or other extra sounds. Abdomen:  Bowel sounds positive,abdomen soft and non-tender without masses, organomegaly or hernias noted. Rectal:  No external abnormalities noted.  Normal sphincter tone. No rectal masses or tenderness. Genitalia:  Testes bilaterally descended without nodularity, tenderness or masses. No scrotal  masses or lesions. No penis lesions or urethral discharge. Prostate:  1+ enlarged.   Msk:  No deformity or scoliosis noted of thoracic or lumbar spine.   Pulses:  R and L carotid,radial,femoral,dorsalis pedis and posterior tibial pulses are full and equal bilaterally Extremities:  No clubbing, cyanosis, edema, or deformity noted with normal full range of motion of all joints.   Neurologic:  No cranial nerve deficits noted. Station and gait are normal. Plantar reflexes are down-going bilaterally. DTRs are symmetrical throughout. Sensory, motor and coordinative functions appear intact. Skin:  Intact without suspicious lesions or rashes Cervical Nodes:  No lymphadenopathy noted Axillary Nodes:  No palpable lymphadenopathy Inguinal Nodes:  No significant adenopathy Psych:  Cognition and judgment appear intact. Alert and cooperative with normal attention span and concentration. No apparent delusions, illusions, hallucinations   Impression & Recommendations:  Problem # 1:  OBESITY (ICD-278.00) Assessment Unchanged  Problem # 2:  URINARY FREQUENCY (ICD-788.41) Assessment: Unchanged  His updated medication list for this problem includes:    Oxybutynin Chloride 15 Mg Tb24 (Oxybutynin chloride) ..... Once daily    Vesicare 5 Mg Tabs (Solifenacin succinate) ..... One q. day VESIcare 5 mg q.d. instead of oxybutynin  Orders: UA Dipstick w/o Micro (automated)  (81003)  Problem # 3:  BENIGN PROSTATIC HYPERTROPHY, HX OF (ICD-V13.8) Assessment: Unchanged  Problem # 4:  VITAMIN D DEFICIENCY (ICD-268.9) Assessment: Deteriorated  Orders: T-Vitamin D (25-Hydroxy) (04540-98119) Prescription Created Electronically [phone])  Problem # 5:  DIABETES MELLITUS, TYPE II (ICD-250.00) Assessment: Improved  His updated medication list for this problem includes:    Enalapril Maleate 10 Mg Tabs (Enalapril maleate) ..... Once daily    Avandamet 4-500 Mg Tabs (Rosiglitazone-metformin) .Marland Kitchen..Marland Kitchen Two times a day     Glipizide 10 Mg Tabs (Glipizide) .Marland Kitchen... 1 by mouth two times a day  Orders: TLB-A1C / Hgb A1C (Glycohemoglobin) (83036-A1C) TLB-Microalbumin/Creat Ratio, Urine (82043-MALB)  Problem # 6:  HYPERTENSION (ICD-401.9) Assessment: Improved  His updated medication list for this problem includes:    Enalapril Maleate 10 Mg Tabs (Enalapril maleate) ..... Once daily    Triamterene-hctz 75-50 Mg Tabs (Triamterene-hctz) .Marland Kitchen... 1 once daily  for edema  Problem # 7:  EDEMA (ICD-782.3) Assessment: Improved  His updated medication list for this problem includes:    Triamterene-hctz 75-50 Mg Tabs (Triamterene-hctz) .Marland Kitchen... 1 once daily  for edema  Complete Medication List: 1)  Enalapril Maleate 10 Mg Tabs (Enalapril maleate) .... Once daily 2)  Avandamet 4-500 Mg Tabs (Rosiglitazone-metformin) .... Two times a day 3)  Oxybutynin Chloride 15 Mg Tb24 (Oxybutynin chloride) .... Once daily 4)  Ibuprofen 800 Mg Tabs (Ibuprofen) .Marland Kitchen.. 1 tablet three times a day after meals 5)  Triamterene-hctz 75-50 Mg Tabs (Triamterene-hctz) .Marland Kitchen.. 1 once daily  for edema 6)  Glipizide 10 Mg Tabs (Glipizide) .Marland Kitchen.. 1 by mouth two times a day 7)  Tandem Plus 162-115.2-1 Mg Caps (Fefum-fepo-fa-b cmp-c-zn-mn-cu) .Marland Kitchen.. 1 by mouth once daily 8)  Vitamin D (ergocalciferol) 50000 Unit Caps (Ergocalciferol) .Marland KitchenMarland KitchenMarland Kitchen 1 weekly x 12 9)  Vesicare 5 Mg Tabs (Solifenacin succinate) .... One q. day  Other Orders: Admin 1st Vaccine (95621) Flu Vaccine 62yrs + (30865) Venipuncture (78469) TLB-Lipid Panel (80061-LIPID) TLB-BMP (Basic Metabolic Panel-BMET) (80048-METABOL) TLB-CBC Platelet - w/Differential (85025-CBCD) TLB-Hepatic/Liver Function Pnl (80076-HEPATIC) TLB-TSH (Thyroid Stimulating Hormone) (84443-TSH) TLB-PSA (Prostate Specific Antigen) (84153-PSA)  Patient Instructions: 1)  Weight continues to  be a problem but no changes, if you dont lose, dont gain 2)  Physical no changes, continue same tx, refilled  medications Prescriptions: VITAMIN D (ERGOCALCIFEROL) 50000 UNIT CAPS (ERGOCALCIFEROL) 1 weekly x 12  #12 x 1   Entered and Authorized by:   Judithann Sheen MD   Signed by:   Judithann Sheen MD on 06/12/2009   Method used:   Faxed to ...       [facility]* (mail-order)       [address]       [place], [place]  [address]       Ph: [phone]       Fax: [phone]   RxID:   [id number] TANDEM PLUS 162-115.2-1 MG CAPS (FEFUM-FEPO-FA-B CMP-C-ZN-MN-CU) 1 by mouth once daily  #90 x 3   Entered and Authorized by:   Judithann Sheen MD   Signed by:   Judithann Sheen MD on 06/09/2009   Method used:   Faxed to ...       [facility]* (mail-order)       [address]       [place], [place]  [address]       Ph: [phone]       Fax: [phone]   RxID:   [id number] GLIPIZIDE 10 MG TABS (GLIPIZIDE) 1 by mouth two times a day  #180 x 3   Entered and Authorized by:   Judithann Sheen MD   Signed by:   Judithann Sheen MD on 06/09/2009   Method used:   Faxed to ...       [facility]* (mail-order)       [address]       [place], [place]  [address]       Ph: [phone]       Fax: [phone]   RxID:   [id number] TRIAMTERENE-HCTZ 75-50 MG TABS (TRIAMTERENE-HCTZ) 1 once daily  for edema  #90 x 3   Entered and Authorized by:   Judithann Sheen MD   Signed by:   Judithann Sheen MD on 06/09/2009   Method used:   Faxed to ...       [facility]* (mail-order)       [address]       [place], [place]  [address]       Ph: [phone]       Fax: [phone]   RxID:   [id number] IBUPROFEN 800 MG  TABS (IBUPROFEN) 1 tablet three times a day after meals  #270 x 3   Entered and Authorized by:   Judithann Sheen MD   Signed by:   Judithann Sheen MD on 06/09/2009   Method used:   Electronically to        [facility]* (mail-order)        [address]       [place], [place]  [address]       Ph: [phone]       Fax: [phone]   RxID:   [id number] OXYBUTYNIN CHLORIDE 15 MG  TB24 (OXYBUTYNIN CHLORIDE) once daily  #90 x 3   Entered and Authorized by:   Judithann Sheen MD   Signed by:   Judithann Sheen MD on 06/09/2009   Method used:   Electronically to  [facility]* (mail-order)       [address]       [place], [place]  [address]       Ph: [phone]       Fax: [phone]   RxID:   [id number] AVANDAMET 4-500 MG  TABS (ROSIGLITAZONE-METFORMIN) two times a day  #180 x 3   Entered and Authorized by:   Judithann Sheen MD   Signed by:   Judithann Sheen MD on 06/09/2009   Method used:   Electronically to        [facility]* (mail-order)       [address]       [place], [place]  [address]       Ph: [phone]       Fax: [phone]   RxID:   [id number] ENALAPRIL MALEATE 10 MG  TABS (ENALAPRIL MALEATE) once daily  #180 x 3   Entered and Authorized by:   Judithann Sheen MD   Signed by:   Judithann Sheen MD on 06/09/2009   Method used:   Electronically to        [facility]* (mail-order)       [address]       [place], [place]  [address]       Ph: [phone]       Fax: [phone]   RxID:   [id number]    Immunization History:  Pneumovax Immunization History:    Pneumovax:  pneumovax (04/17/2006)    Preventive Care Screening  Colonoscopy:    Date:  09/28/2000    Next Due:  09/2010    Results:  abnormal   Last Pneumovax:    Date:  04/17/2006    Results:  Pneumovax  Flu Vaccine Consent Questions     Do you have a history of severe allergic reactions to this vaccine? no    Any prior history of allergic reactions to egg and/or gelatin? no    Do you have a sensitivity to the preservative Thimersol? no    Do you have a past history of Guillan-Barre Syndrome? no    Do you currently have an  acute febrile illness? no    Have you ever had a severe reaction to latex? no    Vaccine information given and explained to patient? yes    Are you currently pregnant? no    Lot Number:[id number]   Exp Date:11/18/2009   Site Given  Left Deltoid IM.ghrn.....    Results:  abnormal   Last Pneumovax:    Date:  04/17/2006    Results:  Pneumovax   .lbflu  Laboratory Results   Urine Tests    Routine Urinalysis   Color: yellow Appearance: Clear Glucose: negative   (Normal Range: Negative) Bilirubin: negative   (Normal Range: Negative) Ketone: 1+   (Normal Range: Negative) Spec. Gravity: 1.020   (Normal Range: 1.003-1.035) Blood: negative   (Normal Range: Negative) pH: 5.0   (Normal Range: 5.0-8.0) Protein: negative   (Normal Range: Negative) Urobilinogen: 0.2   (Normal Range: 0-1) Nitrite: negative   (Normal Range: Negative) Leukocyte Esterace: negative   (Normal Range: Negative)    Comments: Rita Ohara  June 09, 2009 1:34 PM

## 2010-06-23 NOTE — Letter (Signed)
Summary: Generic Letter  [place] [facility]  [address] [place], [place] [address]   Phone: [phone]  Fax: [phone]    06/16/2009 Mack Guise, [place] (ASCP),SBB Medical Assessment Coordinator [facility]   Regarding: Bill [name] DOB  19-Mar-1941   [name] RPR test results were non-reactive. Negative history for syphylis.  If you have any furhter questions please feel free to contact me.            Sincerely,      Dr. Hosie Spangle. Stafford,MD

## 2010-06-30 ENCOUNTER — Other Ambulatory Visit: Payer: Self-pay | Admitting: Family Medicine

## 2010-06-30 MED ORDER — GLUCOSE BLOOD VI STRP: ORAL_STRIP | Status: DC

## 2010-06-30 MED ORDER — ACCU-CHEK SOFT TOUCH DEVICE MISC: 100.0000 | Freq: Two times a day (BID) | Status: DC

## 2010-06-30 MED ORDER — ACCU-CHEK SOFT TOUCH LANCETS MISC: Status: DC

## 2010-06-30 MED ORDER — BLOOD GLUCOSE METER KIT: PACK | Status: DC

## 2010-07-14 ENCOUNTER — Other Ambulatory Visit: Payer: Self-pay | Admitting: Family Medicine

## 2010-07-14 DIAGNOSIS — E119 Type 2 diabetes mellitus without complications: Secondary | ICD-10-CM

## 2010-07-14 MED ORDER — GLUCOSE BLOOD VI STRP: ORAL_STRIP | Status: AC

## 2010-07-14 MED ORDER — ACCU-CHEK SOFT TOUCH DEVICE MISC: 100.0000 | Freq: Two times a day (BID) | Status: DC

## 2010-07-14 MED ORDER — BLOOD GLUCOSE METER KIT: PACK | Status: AC

## 2010-08-02 ENCOUNTER — Encounter: Payer: Self-pay | Admitting: Family Medicine

## 2010-08-18 ENCOUNTER — Ambulatory Visit (INDEPENDENT_AMBULATORY_CARE_PROVIDER_SITE_OTHER): Payer: Medicare HMO | Admitting: Family Medicine

## 2010-08-18 ENCOUNTER — Ambulatory Visit (INDEPENDENT_AMBULATORY_CARE_PROVIDER_SITE_OTHER)
Admission: RE | Admit: 2010-08-18 | Discharge: 2010-08-18 | Disposition: A | Discharge status: Home / Self Care | Payer: Medicare HMO | Source: Ambulatory Visit | Attending: Family Medicine | Admitting: Family Medicine

## 2010-08-18 ENCOUNTER — Encounter: Payer: Self-pay | Admitting: Family Medicine

## 2010-08-18 DIAGNOSIS — R3915 Urgency of urination: Secondary | ICD-10-CM

## 2010-08-18 DIAGNOSIS — E119 Type 2 diabetes mellitus without complications: Secondary | ICD-10-CM

## 2010-08-18 DIAGNOSIS — M129 Arthropathy, unspecified: Secondary | ICD-10-CM

## 2010-08-18 DIAGNOSIS — I10 Essential (primary) hypertension: Secondary | ICD-10-CM

## 2010-08-18 DIAGNOSIS — E785 Hyperlipidemia, unspecified: Secondary | ICD-10-CM

## 2010-08-18 DIAGNOSIS — T148XXA Other injury of unspecified body region, initial encounter: Secondary | ICD-10-CM

## 2010-08-18 DIAGNOSIS — E559 Vitamin D deficiency, unspecified: Secondary | ICD-10-CM

## 2010-08-18 DIAGNOSIS — Z0389 Encounter for observation for other suspected diseases and conditions ruled out: Secondary | ICD-10-CM

## 2010-08-18 DIAGNOSIS — D649 Anemia, unspecified: Secondary | ICD-10-CM

## 2010-08-18 DIAGNOSIS — E039 Hypothyroidism, unspecified: Secondary | ICD-10-CM

## 2010-08-18 DIAGNOSIS — M1990 Unspecified osteoarthritis, unspecified site: Secondary | ICD-10-CM

## 2010-08-18 DIAGNOSIS — Z125 Encounter for screening for malignant neoplasm of prostate: Secondary | ICD-10-CM

## 2010-08-18 LAB — POCT URINALYSIS DIPSTICK
Glucose, UA: NEGATIVE
Ketones, UA: NEGATIVE
Spec Grav, UA: 1.01

## 2010-08-18 LAB — CBC WITH DIFFERENTIAL/PLATELET
Basophils Absolute: 0 10*3/uL (ref 0.0–0.1)
HCT: 37.2 % — ABNORMAL LOW (ref 39.0–52.0)
Lymphs Abs: 1 10*3/uL (ref 0.7–4.0)
Monocytes Absolute: 0.8 10*3/uL (ref 0.1–1.0)
Monocytes Relative: 13.8 % — ABNORMAL HIGH (ref 3.0–12.0)
Neutrophils Relative %: 65.7 % (ref 43.0–77.0)
Platelets: 284 10*3/uL (ref 150.0–400.0)
RDW: 14.6 % (ref 11.5–14.6)

## 2010-08-18 LAB — LIPID PANEL
Cholesterol: 205 mg/dL — ABNORMAL HIGH (ref 0–200)
Triglycerides: 69 mg/dL (ref 0.0–149.0)
VLDL: 13.8 mg/dL (ref 0.0–40.0)

## 2010-08-18 LAB — BASIC METABOLIC PANEL
BUN: 23 mg/dL (ref 6–23)
Calcium: 10 mg/dL (ref 8.4–10.5)
Creatinine, Ser: 1.2 mg/dL (ref 0.4–1.5)
GFR: 64.53 mL/min (ref >60.00)

## 2010-08-18 LAB — HEPATIC FUNCTION PANEL
Albumin: 4.3 g/dL (ref 3.5–5.2)
Total Bilirubin: 0.8 mg/dL (ref 0.3–1.2)

## 2010-08-18 LAB — TSH: TSH: 2.9 u[IU]/mL (ref 0.35–5.50)

## 2010-08-18 LAB — LDL CHOLESTEROL, DIRECT: Direct LDL: 127.7 mg/dL

## 2010-08-18 LAB — PSA: PSA: 2.89 ng/mL (ref 0.10–4.00)

## 2010-08-18 LAB — HEMOGLOBIN A1C: Hgb A1c MFr Bld: 6.6 % — ABNORMAL HIGH (ref 4.6–6.5)

## 2010-08-23 MED ORDER — IBUPROFEN 800 MG PO TABS: 800.0000 mg | ORAL_TABLET | Freq: Three times a day (TID) | ORAL | Status: DC | PRN

## 2010-08-23 MED ORDER — METFORMIN HCL 1000 MG PO TABS: 1000.0000 mg | ORAL_TABLET | Freq: Two times a day (BID) | ORAL | Status: DC

## 2010-08-28 NOTE — Progress Notes (Signed)
  Subjective:    Patient ID: Bill [name], male    DOB: 05-18-1941, 70 y.o.   MRN: [id number] This 70 year old white male was in to discuss his medical problems and get necessary lab studies as well as renewed his medication he complains of swelling of the right hand and after utilizing a drill he would notice a swelling also painful with squeeze and Relates ABGs have been 110-120, blood pressure has been controlled Has not lost weight as we have discussed Continues to have urinary frequency but not as severe as continued on oxybutynin Past continues to have some back pain for which he takes Ibuprofen  HPI    Review of Systems  Constitutional: Negative.   HENT: Negative.   Eyes: Negative.   Respiratory: Negative.   Cardiovascular: Negative.   Gastrointestinal: Negative.   Genitourinary: Positive for frequency.  Musculoskeletal: Positive for back pain.       Pain and swelling of right hand over the past 2 months  Skin: Negative.   Neurological: Negative.   Hematological: Negative.   Psychiatric/Behavioral: Negative.        Objective:   Physical Exam the patient is a well well aware alert obese white male who appears to be in no distress alert cooperative HEENT negative including carotid pulses are good no bruits thyroid is normal to palpation Lungs are clear to palpation percussion and auscultation Heart no evidence of cardiomegaly heart sounds good without murmur of peripheral pulsations are good and equal bilaterally Abdomen obese liver spleen tenderness or nonpalpable no masses felt normal bowel sounds inguinal regions negative Genitalia negative rectal examination reveals 1+ prostate hypertrophy Extremities trace of edema pretibial Examination of the right hand reveals a slightly swollen and tender over the PIP joint Neurological examination negative        Assessment & Plan:  Arthritis of the hand right 2 x-rays were treated with ibuprofen 800 mg t.i.d. Diabetes  under control to continue medication 10 year watch diet decrease calories and attempt to lose weight also increased exercise activity Urinary frequency to be controlled with oxybutynin Peripheral edema to be controlled with Maxzide

## 2010-08-28 NOTE — Patient Instructions (Signed)
We will control her diabetes with the son 10 mg  Twice dailyAs well as metformin 1000 mg twice a day Recommend that you continue to attempt to lose weight We'll x-ray your right hand to rule out fracture Continue ibuprofen 800 mg 3 times a day for arthritis of the hands

## 2010-08-29 ENCOUNTER — Telehealth: Payer: Self-pay | Admitting: Family Medicine

## 2010-08-29 DIAGNOSIS — M1990 Unspecified osteoarthritis, unspecified site: Secondary | ICD-10-CM

## 2010-08-29 MED ORDER — IBUPROFEN 800 MG PO TABS: 800.0000 mg | ORAL_TABLET | Freq: Three times a day (TID) | ORAL | Status: AC

## 2010-08-29 MED ORDER — ENALAPRIL MALEATE 10 MG PO TABS: 10.0000 mg | ORAL_TABLET | Freq: Every day | ORAL | Status: DC

## 2010-08-29 NOTE — Telephone Encounter (Signed)
Pt req script for Enalapril 10 mg. Pt leaving to go out town today. Pls call in asap today.

## 2010-08-30 NOTE — Telephone Encounter (Signed)
Faxed into pharmacy.  

## 2010-09-27 ENCOUNTER — Ambulatory Visit (INDEPENDENT_AMBULATORY_CARE_PROVIDER_SITE_OTHER): Payer: Medicare HMO | Admitting: Family Medicine

## 2010-09-27 ENCOUNTER — Encounter: Payer: Self-pay | Admitting: Family Medicine

## 2010-09-27 VITALS — BP 120/50 | HR 82 | Temp 97.9°F | Wt 307.0 lb

## 2010-09-27 DIAGNOSIS — L259 Unspecified contact dermatitis, unspecified cause: Secondary | ICD-10-CM

## 2010-09-27 DIAGNOSIS — D649 Anemia, unspecified: Secondary | ICD-10-CM

## 2010-09-27 DIAGNOSIS — L309 Dermatitis, unspecified: Secondary | ICD-10-CM

## 2010-09-27 DIAGNOSIS — A6920 Lyme disease, unspecified: Secondary | ICD-10-CM

## 2010-09-27 MED ORDER — VITAMIN D3 1.25 MG (50000 UT) PO CAPS: 1.0000 | ORAL_CAPSULE | ORAL | Status: DC

## 2010-09-27 MED ORDER — TRIAMCINOLONE ACETONIDE 0.1 % EX CREA: TOPICAL_CREAM | Freq: Two times a day (BID) | CUTANEOUS | Status: DC

## 2010-09-27 MED ORDER — DOXYCYCLINE HYCLATE 100 MG PO TABS: 100.0000 mg | ORAL_TABLET | Freq: Two times a day (BID) | ORAL | Status: AC

## 2010-09-27 NOTE — Progress Notes (Signed)
Quick Note:  Discussed with pt at ov on 09/27/2010 ______

## 2010-09-27 NOTE — Patient Instructions (Signed)
Getting Lymes antibody test also complete blood test [place] with doxycycline  Doxycycline 100 mg twice daily for 21 days, also apply cream over large rash and target rash twice daily until gone Will call results of labs, may take until Thursday or Friday

## 2010-09-28 LAB — CBC WITH DIFFERENTIAL/PLATELET
Basophils Absolute: 0 10*3/uL (ref 0.0–0.1)
Eosinophils Absolute: 0.2 10*3/uL (ref 0.0–0.7)
Lymphocytes Relative: 12.6 % (ref 12.0–46.0)
MCHC: 34 g/dL (ref 30.0–36.0)
MCV: 90.2 fl (ref 78.0–100.0)
Monocytes Absolute: 0.8 10*3/uL (ref 0.1–1.0)
Neutrophils Relative %: 72.2 % (ref 43.0–77.0)
Platelets: 229 10*3/uL (ref 150.0–400.0)
RBC: 3.54 Mil/uL — ABNORMAL LOW (ref 4.22–5.81)
RDW: 16.7 % — ABNORMAL HIGH (ref 11.5–14.6)

## 2010-09-28 LAB — B. BURGDORFI ANTIBODIES: B burgdorferi Ab IgG+IgM: 0.16 {ISR}

## 2010-09-29 ENCOUNTER — Encounter: Payer: Self-pay | Admitting: Family Medicine

## 2010-09-29 NOTE — Progress Notes (Signed)
  Subjective:    Patient ID: Bill [name], male    DOB: [birth year], 70 y.o.   MRN: [id number] This in 65-year-old white married male relates he had a episode of melena he is along with finding a tick embedded in his posterior left chest approximately 5 weeks ago he was seen in urgent care in today with an infected tick bite and labeled as Lyme disease.Marland Kitchen He was treated with doxycycline 100 mg b.i.d. for 21 daysOver the past 2-3 days and a rash on right side of his chest posteriorly as well as a target type lesion on the left posterior chest he continues to have malaise, no fever I had him to come in today to get antibody test as well as to evaluate further treatmentHPI    Review of SystemsSee health profile however no other symptoms no Cough, no fever no joint symptoms ,No fever no urinary symptoms no GI symptoms    Objective:   Physical ExamThe patient is well-developed well-nourished obese white male who is in no distress but complaining of itching of the right shoulder posteriorly Heart lung joints all normal Skin has an erythematous rash about 10 x 10 cm on his right posterior chest as well as a target appearance lesion on his left posterior chest approximately 1.5 cm No lymphadenopathy No joint swelling or abnormality       Assessment & Plan:  Lyme Disease wall or up and plan to treat with doxycycline for another 14 days Contact dermatitis right chest posteriorly treated with triamcinolone oh 0.5% t.i.d. 2 obtain laboratory studies on antibody studies as well as a CBC

## 2010-10-29 ENCOUNTER — Encounter: Payer: Self-pay | Admitting: Internal Medicine

## 2010-11-28 ENCOUNTER — Telehealth: Payer: Self-pay | Admitting: Family Medicine

## 2010-11-28 NOTE — Telephone Encounter (Signed)
Refill Oxybutynin for 2 week supply. Ask for [place].

## 2010-11-29 MED ORDER — OXYBUTYNIN CHLORIDE ER 15 MG PO TB24: 15.0000 mg | ORAL_TABLET | Freq: Every day | ORAL | Status: DC

## 2010-11-29 NOTE — Telephone Encounter (Signed)
rx called into pharmacy

## 2010-12-21 DIAGNOSIS — I350 Nonrheumatic aortic (valve) stenosis: Secondary | ICD-10-CM

## 2010-12-21 HISTORY — PX: US ECHOCARDIOGRAPHY: HXRAD669

## 2010-12-21 HISTORY — DX: Nonrheumatic aortic (valve) stenosis: I35.0

## 2010-12-27 ENCOUNTER — Encounter: Payer: Self-pay | Admitting: Family Medicine

## 2010-12-27 ENCOUNTER — Ambulatory Visit (INDEPENDENT_AMBULATORY_CARE_PROVIDER_SITE_OTHER): Payer: Medicare HMO | Admitting: Family Medicine

## 2010-12-27 VITALS — BP 120/68 | HR 87 | Temp 98.4°F | Resp 20 | Wt 305.0 lb

## 2010-12-27 DIAGNOSIS — R0600 Dyspnea, unspecified: Secondary | ICD-10-CM

## 2010-12-27 DIAGNOSIS — R0609 Other forms of dyspnea: Secondary | ICD-10-CM

## 2010-12-27 DIAGNOSIS — D649 Anemia, unspecified: Secondary | ICD-10-CM

## 2010-12-27 DIAGNOSIS — Z79899 Other long term (current) drug therapy: Secondary | ICD-10-CM

## 2010-12-27 DIAGNOSIS — R0989 Other specified symptoms and signs involving the circulatory and respiratory systems: Secondary | ICD-10-CM

## 2010-12-27 DIAGNOSIS — E119 Type 2 diabetes mellitus without complications: Secondary | ICD-10-CM

## 2010-12-27 DIAGNOSIS — R5383 Other fatigue: Secondary | ICD-10-CM

## 2010-12-27 DIAGNOSIS — R5381 Other malaise: Secondary | ICD-10-CM

## 2010-12-27 DIAGNOSIS — I4891 Unspecified atrial fibrillation: Secondary | ICD-10-CM

## 2010-12-27 LAB — POCT HEMOGLOBIN: Hemoglobin: 10.3

## 2010-12-27 LAB — POCT URINALYSIS DIPSTICK
Bilirubin, UA: NEGATIVE
Blood, UA: NEGATIVE
Leukocytes, UA: NEGATIVE
Nitrite, UA: NEGATIVE
Protein, UA: NEGATIVE
Urobilinogen, UA: 0.2
pH, UA: 5.5

## 2010-12-27 LAB — BASIC METABOLIC PANEL
BUN: 25 mg/dL — ABNORMAL HIGH (ref 6–23)
Calcium: 9.8 mg/dL (ref 8.4–10.5)
Chloride: 97 mEq/L (ref 96–112)
Creatinine, Ser: 1.4 mg/dL (ref 0.4–1.5)

## 2010-12-27 LAB — HEMOGLOBIN A1C: Hgb A1c MFr Bld: 6.3 % (ref 4.6–6.5)

## 2010-12-27 LAB — CBC WITH DIFFERENTIAL/PLATELET
Basophils Absolute: 0 10*3/uL (ref 0.0–0.1)
Eosinophils Absolute: 0.1 10*3/uL (ref 0.0–0.7)
HCT: 35 % — ABNORMAL LOW (ref 39.0–52.0)
Lymphs Abs: 0.8 10*3/uL (ref 0.7–4.0)
Monocytes Absolute: 0.9 10*3/uL (ref 0.1–1.0)
Monocytes Relative: 13.6 % — ABNORMAL HIGH (ref 3.0–12.0)
Platelets: 274 10*3/uL (ref 150.0–400.0)
RDW: 15.3 % — ABNORMAL HIGH (ref 11.5–14.6)

## 2010-12-27 LAB — IRON: Iron: 34 ug/dL — ABNORMAL LOW (ref 42–165)

## 2010-12-27 MED ORDER — GLIPIZIDE 10 MG PO TABS: 10.0000 mg | ORAL_TABLET | Freq: Two times a day (BID) | ORAL | Status: DC

## 2010-12-27 MED ORDER — ENALAPRIL MALEATE 10 MG PO TABS: 10.0000 mg | ORAL_TABLET | Freq: Every day | ORAL | Status: DC

## 2010-12-27 NOTE — Patient Instructions (Addendum)
  cYour fatigue and shortness of breath appears to be from the anemia with a hemoglobin of 10.3. March 29 your hemoglobin was 12.4 since then he gave one unit of blood and course now 10.3 You have had fatigue over the past month also during that time has been sick I have ordered a full battery of tests to evaluate anemia and will notify you as soon as  I receive the results and we will proceed with appropriate treatment In the meantime no strenuous work. EKG reveals atrial fibrillation which can cause the  Exertional shortness of breathe, have put in a referral to a cardiologist to evaluate the arrhythmia Will call results of lab studies on Wednesday

## 2010-12-27 NOTE — Progress Notes (Signed)
  Subjective:    Patient ID: Bill [name], male    DOB: 1941/04/24, 70 y.o.   MRN: [id number] This 70 year old white male is in today with complaints of exertional dyspnea over the past 3-4 weeks Creason in severity, as check a hemoglobin A1c today but we will check a as fatigue 3 weeks ago he had upper Spe[place] tore infection pharyngitis possible sinusitis treated at an urgent care with amoxicillin and Flonase nasal spray. At this time he had some cough but the cough has cleared He relates his diabetes mellitus is under good control, but we will check a hemoglobin A1c Reviewing chart root had a hemoglobin of 12.4 on 3/29 and one week later donated one unit of blood to the [employer]. His total symptoms regarding fatigue exertional dyspnea have been present over the past month roughly he relates if he walks incline or 4 a very short time is short of breath. He has no other positive findings on systems review  HPI    Review of Systems See HPI     Objective:   Physical Exam The patient is a well-developed well-nourished obese white male who appears to be in no distress HEENT conjunctiva pale otherwise negative Lungs are clear palpation percussion and auscultation no rales no dullness breath sounds normal Heart examination difficult to determine cardiac size appears borderline, no murmurs cardiac rate 70 and then at time of EKG 87. Electrocardiogram revealed right bundle branch block with atrial fibrillation slow rate Abdomen liver spleen kidneys are nonpalpable no tenderness bowel sounds normal Rectal examination negative for stool exam was Hemoccult negative Extremities no edema       Assessment & Plan:  Fatigue is most likely caused by the marked anemia as well as the atrial fibrillation Exertional dyspnea also cause by anemia and atrial fibrillation Atrial Fibrillation demonstrated by electrocardiogram 2 refer to cardiologist Anemia 10.3 hemoglobin and also to repeat CBC, get iron study,  ferritin

## 2010-12-29 ENCOUNTER — Other Ambulatory Visit (HOSPITAL_COMMUNITY): Payer: Self-pay | Admitting: Internal Medicine

## 2010-12-29 ENCOUNTER — Encounter: Payer: Self-pay | Admitting: Internal Medicine

## 2010-12-29 ENCOUNTER — Ambulatory Visit (HOSPITAL_COMMUNITY): Payer: Medicare HMO | Attending: Internal Medicine

## 2010-12-29 ENCOUNTER — Ambulatory Visit (INDEPENDENT_AMBULATORY_CARE_PROVIDER_SITE_OTHER): Payer: Medicare HMO | Admitting: Internal Medicine

## 2010-12-29 VITALS — BP 134/64 | HR 89 | Ht 74.0 in | Wt 303.0 lb

## 2010-12-29 DIAGNOSIS — I079 Rheumatic tricuspid valve disease, unspecified: Secondary | ICD-10-CM | POA: Insufficient documentation

## 2010-12-29 DIAGNOSIS — R0989 Other specified symptoms and signs involving the circulatory and respiratory systems: Secondary | ICD-10-CM | POA: Insufficient documentation

## 2010-12-29 DIAGNOSIS — I10 Essential (primary) hypertension: Secondary | ICD-10-CM

## 2010-12-29 DIAGNOSIS — R9431 Abnormal electrocardiogram [ECG] [EKG]: Secondary | ICD-10-CM

## 2010-12-29 DIAGNOSIS — R0602 Shortness of breath: Secondary | ICD-10-CM

## 2010-12-29 DIAGNOSIS — I359 Nonrheumatic aortic valve disorder, unspecified: Secondary | ICD-10-CM | POA: Insufficient documentation

## 2010-12-29 DIAGNOSIS — E119 Type 2 diabetes mellitus without complications: Secondary | ICD-10-CM | POA: Insufficient documentation

## 2010-12-29 DIAGNOSIS — D649 Anemia, unspecified: Secondary | ICD-10-CM | POA: Insufficient documentation

## 2010-12-29 DIAGNOSIS — I0989 Other specified rheumatic heart diseases: Secondary | ICD-10-CM | POA: Insufficient documentation

## 2010-12-29 DIAGNOSIS — R0609 Other forms of dyspnea: Secondary | ICD-10-CM | POA: Insufficient documentation

## 2010-12-29 DIAGNOSIS — I350 Nonrheumatic aortic (valve) stenosis: Secondary | ICD-10-CM

## 2010-12-29 DIAGNOSIS — R0600 Dyspnea, unspecified: Secondary | ICD-10-CM

## 2010-12-29 DIAGNOSIS — I4891 Unspecified atrial fibrillation: Secondary | ICD-10-CM | POA: Insufficient documentation

## 2010-12-29 MED ORDER — PERFLUTREN PROTEIN A MICROSPH IV SUSP
0.5000 mL | Freq: Once | INTRAVENOUS | Status: AC
  Administered 2010-12-29: 0.5 mL via INTRAVENOUS

## 2011-01-02 ENCOUNTER — Other Ambulatory Visit: Payer: Self-pay | Admitting: Family Medicine

## 2011-01-02 ENCOUNTER — Telehealth: Payer: Self-pay

## 2011-01-02 ENCOUNTER — Other Ambulatory Visit: Payer: Self-pay

## 2011-01-02 DIAGNOSIS — R799 Abnormal finding of blood chemistry, unspecified: Secondary | ICD-10-CM

## 2011-01-02 DIAGNOSIS — E611 Iron deficiency: Secondary | ICD-10-CM

## 2011-01-02 DIAGNOSIS — D649 Anemia, unspecified: Secondary | ICD-10-CM

## 2011-01-02 NOTE — Progress Notes (Signed)
Quick Note:  Pt aware and has set up a lab appt for 02/13/11. ______

## 2011-01-02 NOTE — Telephone Encounter (Signed)
Called to pt to discuss labs and set up a lab appt.

## 2011-01-03 ENCOUNTER — Encounter: Payer: Self-pay | Admitting: Internal Medicine

## 2011-01-03 DIAGNOSIS — R9431 Abnormal electrocardiogram [ECG] [EKG]: Secondary | ICD-10-CM | POA: Insufficient documentation

## 2011-01-03 NOTE — Progress Notes (Signed)
HPI [name] he is referred today by Dr. Scotty Court for possible atrial fibrillation. The patient is a 70 year old man with a history of hypertension and diabetes. He has been bothered recently by bronchitic symptoms. In Dr. Charmian Muff office several days ago, his EKG was read as possible atrial fibrillation. The patient has not had syncope and denies palpitations. He does complain of some dyspnea. It's unclear whether this is related to his residual bronchitis OR for some other reason. He denies syncope. He has had no peripheral edema. He denies chest pain. His dyspnea is currently class II in severity. No Known Allergies   Current Outpatient Prescriptions  Medication Sig Dispense Refill  . Blood Glucose Monitoring Suppl (BLOOD GLUCOSE METER) kit Use as instructed  1 each  0  . Cholecalciferol (VITAMIN D3) 50000 UNITS CAPS Take 1 capsule by mouth once a week.  12 capsule  1  . enalapril (VASOTEC) 10 MG tablet Take 1 tablet (10 mg total) by mouth daily.  90 tablet  1  . FeFum-FePo-FA-B Cmp-C-Zn-Mn-Cu (TANDEM PLUS) 162-115.2-1 MG CAPS Take by mouth daily.        Marland Kitchen glipiZIDE (GLUCOTROL) 10 MG tablet Take 10 mg by mouth daily.        Marland Kitchen glucose blood test strip Use as instructed  300 each  3  . ibuprofen (ADVIL,MOTRIN) 800 MG tablet Take 800 mg by mouth 3 (three) times daily.        Demetra Shiner Devices (SOFT TOUCH LANCET DEVICE) MISC 100 strips by Does not apply route 2 (two) times daily.  3 each  3  . metFORMIN (GLUCOPHAGE) 1000 MG tablet Take 1,000 mg by mouth daily.        Marland Kitchen oxybutynin (DITROPAN XL) 15 MG 24 hr tablet Take 1 tablet (15 mg total) by mouth daily.  14 tablet  0  . Vitamin D, Ergocalciferol, (DRISDOL) 50000 UNITS CAPS          Past Medical History  Diagnosis Date  . Hypertension   . DM (diabetes mellitus)   . Exogenous obesity     ROS:   All systems reviewed and negative except as noted in the HPI.   Past Surgical History  Procedure Date  . Kidney stone removal   . Finger  sx      No family history on file.   History   Social History  . Marital Status: Married    Spouse Name: N/A    Number of Children: N/A  . Years of Education: N/A   Occupational History  . Not on file.   Social History Main Topics  . Smoking status: Never Smoker   . Smokeless tobacco: Not on file  . Alcohol Use: Yes  . Drug Use: No  . Sexually Active: Not on file   Other Topics Concern  . Not on file   Social History Narrative  . No narrative on file     BP 134/64  Pulse 89  Ht 6\' 2"  (1.88 m)  Wt 303 lb (137.44 kg)  BMI 38.90 kg/m2  Physical Exam:  Obese,Well appearing NAD HEENT: Unremarkable Neck:  No JVD, no thyromegally Lymphatics:  No adenopathy Back:  No CVA tenderness Lungs:  Clear HEART:  Regular rate rhythm, grade 2/6 systolic murmur consistent with aortic stenosis versus sclerosis, no rubs, no clicks Abd:  soft, positive bowel sounds, no organomegally, no rebound, no guarding Ext:  2 plus pulses, no edema, no cyanosis, no clubbing Skin:  No rashes no nodules Neuro:  CN II through XII intact, motor grossly intact  EKG Normal sinus rhythm  Assess/Plan:

## 2011-01-03 NOTE — Assessment & Plan Note (Addendum)
I have reviewed the patient's electrocardiogram. I do not think he has clear cut atrial fibrillation. I have recommended a period of watchful waiting.

## 2011-01-04 NOTE — Telephone Encounter (Signed)
Pt aware.

## 2011-01-05 DIAGNOSIS — I350 Nonrheumatic aortic (valve) stenosis: Secondary | ICD-10-CM | POA: Insufficient documentation

## 2011-01-05 NOTE — Assessment & Plan Note (Signed)
His exam suggests at least mild aortic stenosis. I have recommended we obtain a 2D echo.

## 2011-01-05 NOTE — Assessment & Plan Note (Addendum)
His blood pressure is fairly well controlled. I have asked him to maintain a low sodium diet and to lose weight. He will continue his current meds.

## 2011-02-13 ENCOUNTER — Other Ambulatory Visit (INDEPENDENT_AMBULATORY_CARE_PROVIDER_SITE_OTHER): Payer: Medicare HMO

## 2011-02-13 DIAGNOSIS — D649 Anemia, unspecified: Secondary | ICD-10-CM

## 2011-02-13 LAB — CBC WITH DIFFERENTIAL/PLATELET
Basophils Absolute: 0 10*3/uL (ref 0.0–0.1)
Eosinophils Relative: 3.1 % (ref 0.0–5.0)
Hemoglobin: 13.2 g/dL (ref 13.0–17.0)
Lymphocytes Relative: 15.3 % (ref 12.0–46.0)
Monocytes Relative: 12.2 % — ABNORMAL HIGH (ref 3.0–12.0)
Platelets: 217 10*3/uL (ref 150.0–400.0)
RDW: 17.7 % — ABNORMAL HIGH (ref 11.5–14.6)
WBC: 5.6 10*3/uL (ref 4.5–10.5)

## 2011-03-10 ENCOUNTER — Telehealth: Payer: Self-pay

## 2011-03-10 NOTE — Telephone Encounter (Signed)
Pt called and stated the wanted blood test results and he wanted to get oxybutynin 50 mg and pick it up at the local pharmacy.  Pt stated Dr. Scotty Court recommended; pt stated the medication is 4110 dollars at [facility] and $94 w/o insurance.

## 2011-03-15 NOTE — Telephone Encounter (Signed)
Pls advise.  

## 2011-03-16 ENCOUNTER — Encounter: Payer: Self-pay | Admitting: Internal Medicine

## 2011-03-16 ENCOUNTER — Ambulatory Visit (INDEPENDENT_AMBULATORY_CARE_PROVIDER_SITE_OTHER): Payer: Medicare HMO | Admitting: Internal Medicine

## 2011-03-16 VITALS — BP 110/80 | Temp 98.1°F | Wt 314.0 lb

## 2011-03-16 DIAGNOSIS — D649 Anemia, unspecified: Secondary | ICD-10-CM

## 2011-03-16 DIAGNOSIS — Z23 Encounter for immunization: Secondary | ICD-10-CM

## 2011-03-16 DIAGNOSIS — R0600 Dyspnea, unspecified: Secondary | ICD-10-CM

## 2011-03-16 DIAGNOSIS — Z0000 Encounter for general adult medical examination without abnormal findings: Secondary | ICD-10-CM

## 2011-03-16 DIAGNOSIS — I359 Nonrheumatic aortic valve disorder, unspecified: Secondary | ICD-10-CM

## 2011-03-16 DIAGNOSIS — R0609 Other forms of dyspnea: Secondary | ICD-10-CM

## 2011-03-16 DIAGNOSIS — K6289 Other specified diseases of anus and rectum: Secondary | ICD-10-CM

## 2011-03-16 DIAGNOSIS — R609 Edema, unspecified: Secondary | ICD-10-CM

## 2011-03-16 DIAGNOSIS — E039 Hypothyroidism, unspecified: Secondary | ICD-10-CM

## 2011-03-16 DIAGNOSIS — R198 Other specified symptoms and signs involving the digestive system and abdomen: Secondary | ICD-10-CM

## 2011-03-16 DIAGNOSIS — I350 Nonrheumatic aortic (valve) stenosis: Secondary | ICD-10-CM

## 2011-03-16 DIAGNOSIS — E871 Hypo-osmolality and hyponatremia: Secondary | ICD-10-CM

## 2011-03-16 DIAGNOSIS — I10 Essential (primary) hypertension: Secondary | ICD-10-CM

## 2011-03-16 LAB — BASIC METABOLIC PANEL
GFR: 69.14 mL/min (ref >60.00)
Glucose, Bld: 173 mg/dL — ABNORMAL HIGH (ref 70–99)
Potassium: 4.7 mEq/L (ref 3.5–5.1)
Sodium: 137 mEq/L (ref 135–145)

## 2011-03-16 NOTE — Patient Instructions (Signed)
General surgical evaluation as scheduled  Call or return to clinic prn if these symptoms worsen or fail to improve as anticipated.

## 2011-03-16 NOTE — Progress Notes (Signed)
  Subjective:    Patient ID: Bill [name], male    DOB: December 06, 1940, 70 y.o.   MRN: [id number]  HPI  70 year old patient who has multiple medical problems. This includes exogenous obesity type 2 diabetes hypertension and a history of peripheral edema. He also has a history of mild aortic stenosis he does take a diuretic occasionally and was noted earlier to have some mild hyponatremia. A recent 2-D echocardiogram revealed mild AS and normal LV function. He has been followed by cardiology. Complaints include mild dyspnea exertion. His chief complaint today is a mass in the rectal area. He noticed this yesterday for the first time when bathing. No change in his bowel habits. He does have a history of mild intermittent anemia but he does donate blood. Most recent CBC revealed normal H&H    Review of Systems  Constitutional: Negative for fever, chills, appetite change and fatigue.  HENT: Negative for hearing loss, ear pain, congestion, sore throat, trouble swallowing, neck stiffness, dental problem, voice change and tinnitus.   Eyes: Negative for pain, discharge and visual disturbance.  Respiratory: Positive for shortness of breath. Negative for cough, chest tightness, wheezing and stridor.   Cardiovascular: Positive for leg swelling. Negative for chest pain and palpitations.  Gastrointestinal: Negative for nausea, vomiting, abdominal pain, diarrhea, constipation, blood in stool and abdominal distention.       Rectal mass but no pain  Genitourinary: Negative for urgency, hematuria, flank pain, discharge, difficulty urinating and genital sores.  Musculoskeletal: Negative for myalgias, back pain, joint swelling, arthralgias and gait problem.  Skin: Negative for rash.  Neurological: Negative for dizziness, syncope, speech difficulty, weakness, numbness and headaches.  Hematological: Negative for adenopathy. Does not bruise/bleed easily.  Psychiatric/Behavioral: Negative for behavioral problems and  dysphoric mood. The patient is not nervous/anxious.        Objective:   Physical Exam  Constitutional: He is oriented to person, place, and time. He appears well-developed.       Weight 314 Blood pressure 110 over  HENT:  Head: Normocephalic.  Right Ear: External ear normal.  Left Ear: External ear normal.  Eyes: Conjunctivae and EOM are normal.  Neck: Normal range of motion.  Cardiovascular: Normal rate.   Murmur heard.      Grade 2-3/6 systolic murmur at the primary aortic area  Pulmonary/Chest: Effort normal and breath sounds normal. No respiratory distress. He has no wheezes.       O2 saturation 97  Abdominal: Bowel sounds are normal.  Genitourinary: Guaiac negative stool.       Rectal mass noted at the 9:00 position  Musculoskeletal: Normal range of motion. He exhibits no edema and no tenderness.  Neurological: He is alert and oriented to person, place, and time.  Psychiatric: He has a normal mood and affect. His behavior is normal.          Assessment & Plan:   Rectal mass. We'll set up for general surgery evaluation Diabetes mellitus well controlled Hypertension stable Mild aortic stenosis Exogenous obesity History of hyponatremia. Probably secondary to diuretic therapy

## 2011-03-17 ENCOUNTER — Telehealth: Payer: Self-pay

## 2011-03-17 DIAGNOSIS — R0602 Shortness of breath: Secondary | ICD-10-CM

## 2011-03-17 LAB — D-DIMER, QUANTITATIVE: D-Dimer, Quant: 1.13 ug/mL-FEU — ABNORMAL HIGH (ref 0.00–0.48)

## 2011-03-17 NOTE — Telephone Encounter (Signed)
Pt seen in office

## 2011-03-17 NOTE — Telephone Encounter (Signed)
Attempt to call- VM - LMTCB on Monday and speak with Bill [name] - need to have additional xray - will be done on Monday - if and SOB< n/v increase in pain go directly to ER - we are trying to r/o clot. Elevated D- Dimmer.  Dr. Genoveva Ill informed that I was unalble to locate pt - order placed for Monday - msg sent to [facility] to f/u on. KIK

## 2011-03-18 ENCOUNTER — Encounter: Payer: Self-pay | Admitting: Family Medicine

## 2011-03-18 ENCOUNTER — Ambulatory Visit (INDEPENDENT_AMBULATORY_CARE_PROVIDER_SITE_OTHER): Payer: Medicare HMO | Admitting: Family Medicine

## 2011-03-18 DIAGNOSIS — R198 Other specified symptoms and signs involving the digestive system and abdomen: Secondary | ICD-10-CM

## 2011-03-18 DIAGNOSIS — K6289 Other specified diseases of anus and rectum: Secondary | ICD-10-CM | POA: Insufficient documentation

## 2011-03-18 NOTE — Assessment & Plan Note (Signed)
This may be an atypical appering hemorrhoid.  No active bleeding. Advised pt that he would likely have blood on TP with BM but that if it resolved, as it did today, then just keep OV with gen surgery.  Nontoxic.  No active bleed now.  He agrees with plan.  Fu prn.

## 2011-03-18 NOTE — Patient Instructions (Signed)
Keep the appointment with surgery.  You will likely have small, intermittent bleeding with BMs.  If it slows down and stops, it should be fine.  If profuse, persistent bleeding, then go to ER.  Take care. Don't take aspirin in meantime.

## 2011-03-18 NOTE — Progress Notes (Signed)
Thursday he noted a 'lump in rear end.'  Was seen by Dr. Amador Cunas.  Rectal mass noted and has eval with gen surgery pending, 03/29/11.  Bleeding noted this AM.  He packed the area with toilet paper.  Not dizzy on standing.  No CP. SOB at baseline, long standing over last few months.  No aspirin.  He is taking ibuprofen for back pain.    Meds, vitals, and allergies reviewed.   ROS: See HPI.  Otherwise, noncontributory.  nad rrr Rectal exam with small nontender mass at 9 o'clock.  Central scabbing noted, no active bleeding.  Recovered with gauze.

## 2011-03-20 ENCOUNTER — Encounter (INDEPENDENT_AMBULATORY_CARE_PROVIDER_SITE_OTHER): Payer: Self-pay | Admitting: Surgery

## 2011-03-20 ENCOUNTER — Ambulatory Visit (INDEPENDENT_AMBULATORY_CARE_PROVIDER_SITE_OTHER)
Admission: RE | Admit: 2011-03-20 | Discharge: 2011-03-20 | Disposition: A | Discharge status: Home / Self Care | Payer: Medicare HMO | Source: Ambulatory Visit | Attending: Internal Medicine | Admitting: Internal Medicine

## 2011-03-20 ENCOUNTER — Telehealth: Payer: Self-pay | Admitting: *Deleted

## 2011-03-20 DIAGNOSIS — R0602 Shortness of breath: Secondary | ICD-10-CM

## 2011-03-20 MED ORDER — IOHEXOL 300 MG/ML  SOLN
80.0000 mL | Freq: Once | INTRAMUSCULAR | Status: AC | PRN
  Administered 2011-03-20: 80 mL via INTRAVENOUS

## 2011-03-20 NOTE — Telephone Encounter (Signed)
Radiology called with Negative reading for PE on pt.  Please read entire report via computer.  Any instructions for pt?

## 2011-03-20 NOTE — Telephone Encounter (Signed)
patient  Says he has gone to cardiology and would like to wait on dr Kirtland Bouchard

## 2011-03-20 NOTE — Telephone Encounter (Signed)
Refer patient to cardiology for further evaluation

## 2011-03-29 ENCOUNTER — Encounter (INDEPENDENT_AMBULATORY_CARE_PROVIDER_SITE_OTHER): Payer: Self-pay | Admitting: Surgery

## 2011-03-29 ENCOUNTER — Ambulatory Visit (INDEPENDENT_AMBULATORY_CARE_PROVIDER_SITE_OTHER): Payer: Medicare HMO | Admitting: Surgery

## 2011-03-29 VITALS — BP 138/72 | HR 68 | Temp 97.8°F | Resp 16 | Ht 74.0 in | Wt 306.0 lb

## 2011-03-29 DIAGNOSIS — K645 Perianal venous thrombosis: Secondary | ICD-10-CM

## 2011-03-29 NOTE — Patient Instructions (Signed)
If this area is bothering you, you may soak in a tub with warm soapy water 2-3 times a day.    Use Tucks pads after bowel movements.  This area should heal up on it's own in a few weeks.

## 2011-03-29 NOTE — Progress Notes (Signed)
Chief Complaint  Patient presents with  . Rectal Problems    Eval of rectal mass    HPI Bill [name] is a 70 y.o. male.  The patient presents with a two week history of a palpable mass at his rectum.  He noticed this one day in the shower.  He denies any pain.  Subsequent to his evaluation by Dr. Amador Cunas, this area has begun draining some bloody fluid.  He still has no pain. HPI  Past Medical History  Diagnosis Date  . Hypertension   . DM (diabetes mellitus)   . Exogenous obesity     Past Surgical History  Procedure Date  . Kidney stone removal   . Finger sx   . Back surgery     removal of lower back cartilage due to damage. Patient does not remember date of surgery.    History reviewed. No pertinent family history.  Social History History  Substance Use Topics  . Smoking status: Never Smoker   . Smokeless tobacco: Never Used  . Alcohol Use: Yes    No Known Allergies  Current Outpatient Prescriptions  Medication Sig Dispense Refill  . Blood Glucose Monitoring Suppl (BLOOD GLUCOSE METER) kit Use as instructed  1 each  0  . enalapril (VASOTEC) 10 MG tablet Take 1 tablet (10 mg total) by mouth daily.  90 tablet  1  . glipiZIDE (GLUCOTROL) 10 MG tablet Take 10 mg by mouth daily.        Marland Kitchen glucose blood test strip Use as instructed  300 each  3  . ibuprofen (ADVIL,MOTRIN) 800 MG tablet Take 800 mg by mouth 3 (three) times daily.        Demetra Shiner Devices (SOFT TOUCH LANCET DEVICE) MISC 100 strips by Does not apply route 2 (two) times daily.  3 each  3  . metFORMIN (GLUCOPHAGE) 1000 MG tablet Take 1,000 mg by mouth daily.        Marland Kitchen oxybutynin (DITROPAN XL) 15 MG 24 hr tablet Take 1 tablet (15 mg total) by mouth daily.  14 tablet  0  . triamterene-hydrochlorothiazide (MAXZIDE) 75-50 MG per tablet Take 1 tablet by mouth daily.          Review of Systems Review of Systems Review of Systems  Constitutional: Negative for fever, chills, appetite change and fatigue.  HENT:  Negative for hearing loss, ear pain, congestion, sore throat, trouble swallowing, neck stiffness, dental problem, voice change and tinnitus.  Eyes: Negative for pain, discharge and visual disturbance.  Respiratory: Positive for shortness of breath. Negative for cough, chest tightness, wheezing and stridor.  Cardiovascular: Positive for leg swelling. Negative for chest pain and palpitations.  Gastrointestinal: Negative for nausea, vomiting, abdominal pain, diarrhea, constipation, blood in stool and abdominal distention.  Rectal mass but no pain  Genitourinary: Negative for urgency, hematuria, flank pain, discharge, difficulty urinating and genital sores.  Musculoskeletal: Negative for myalgias, back pain, joint swelling, arthralgias and gait problem.  Skin: Negative for rash.  Neurological: Negative for dizziness, syncope, speech difficulty, weakness, numbness and headaches.  Hematological: Negative for adenopathy. Does not bruise/bleed easily.  Psychiatric/Behavioral: Negative for behavioral problems and dysphoric mood. The patient is not nervous/anxious  Blood pressure 138/72, pulse 68, temperature 97.8 F (36.6 C), temperature source Temporal, resp. rate 16, height 6\' 2"  (1.88 m), weight 306 lb (138.801 kg).  Physical Exam Physical Exam Obese WM in NAD Rectal - palpable mass with external opening on the left side; no surrounding erythema; minimal drainage Data  Reviewed None Assessment    Thrombosed external hemorrhoid - spontaneously ruptured    Plan    Maintain regular bowel movements Sitz baths PRN Use tucks pads or some type of wet wipes after bowel movements instead of dry toilet paper Recheck in 3 weeks.       Ardell Makarewicz K. 03/29/2011, 2:02 PM

## 2011-03-30 ENCOUNTER — Telehealth: Payer: Self-pay

## 2011-03-30 NOTE — Telephone Encounter (Signed)
Please call pt - there is some confusion on his part - he doesn't think he needs to see cardio - and was told everything ok - this was done in aug. (echo and ekg - dr. Patty Sermons) Please call because I cant make him understand that he needs cardio eval for CTA - needs to speak with you

## 2011-03-31 NOTE — Telephone Encounter (Signed)
This has been resolved

## 2011-04-20 ENCOUNTER — Encounter (INDEPENDENT_AMBULATORY_CARE_PROVIDER_SITE_OTHER): Payer: Self-pay | Admitting: Surgery

## 2011-04-20 ENCOUNTER — Ambulatory Visit (INDEPENDENT_AMBULATORY_CARE_PROVIDER_SITE_OTHER): Payer: Medicare HMO | Admitting: Surgery

## 2011-04-20 VITALS — BP 132/78 | HR 60 | Temp 97.6°F | Resp 18 | Ht 74.0 in | Wt 306.1 lb

## 2011-04-20 DIAGNOSIS — K645 Perianal venous thrombosis: Secondary | ICD-10-CM

## 2011-04-20 NOTE — Patient Instructions (Signed)
Avoid constipation;  Follow-up as needed.

## 2011-04-20 NOTE — Progress Notes (Signed)
Chief Complaint  Patient presents with  . Rectal Problems    Eval of rectal mass   Today's note:   The thrombosed hemorrhoid has completely healed.  No further bloody drainage.  No pain.  He denies any problems with constipation.  Small anterior skin tag.  Imp:  Healed external thrombosed hemorrhoid Plan:  Avoid constipation.  Follow-up PRN  Previous note:   HPI Bill [name] is a 70 y.o. male.  The patient presents with a two week history of a palpable mass at his rectum.  He noticed this one day in the shower.  He denies any pain.  Subsequent to his evaluation by Dr. Amador Cunas, this area has begun draining some bloody fluid.  He still has no pain. HPI  Past Medical History  Diagnosis Date  . Hypertension   . DM (diabetes mellitus)   . Exogenous obesity     Past Surgical History  Procedure Date  . Kidney stone removal   . Finger sx   . Back surgery     removal of lower back cartilage due to damage. Patient does not remember date of surgery.    History reviewed. No pertinent family history.  Social History History  Substance Use Topics  . Smoking status: Never Smoker   . Smokeless tobacco: Never Used  . Alcohol Use: Yes    No Known Allergies  Current Outpatient Prescriptions  Medication Sig Dispense Refill  . Blood Glucose Monitoring Suppl (BLOOD GLUCOSE METER) kit Use as instructed  1 each  0  . enalapril (VASOTEC) 10 MG tablet Take 1 tablet (10 mg total) by mouth daily.  90 tablet  1  . glipiZIDE (GLUCOTROL) 10 MG tablet Take 10 mg by mouth daily.        Marland Kitchen glucose blood test strip Use as instructed  300 each  3  . ibuprofen (ADVIL,MOTRIN) 800 MG tablet Take 800 mg by mouth 3 (three) times daily.        Demetra Shiner Devices (SOFT TOUCH LANCET DEVICE) MISC 100 strips by Does not apply route 2 (two) times daily.  3 each  3  . metFORMIN (GLUCOPHAGE) 1000 MG tablet Take 1,000 mg by mouth daily.        Marland Kitchen oxybutynin (DITROPAN XL) 15 MG 24 hr tablet Take 1 tablet (15 mg  total) by mouth daily.  14 tablet  0  . triamterene-hydrochlorothiazide (MAXZIDE) 75-50 MG per tablet Take 1 tablet by mouth daily.          Review of Systems Review of Systems Review of Systems  Constitutional: Negative for fever, chills, appetite change and fatigue.  HENT: Negative for hearing loss, ear pain, congestion, sore throat, trouble swallowing, neck stiffness, dental problem, voice change and tinnitus.  Eyes: Negative for pain, discharge and visual disturbance.  Respiratory: Positive for shortness of breath. Negative for cough, chest tightness, wheezing and stridor.  Cardiovascular: Positive for leg swelling. Negative for chest pain and palpitations.  Gastrointestinal: Negative for nausea, vomiting, abdominal pain, diarrhea, constipation, blood in stool and abdominal distention.  Rectal mass but no pain  Genitourinary: Negative for urgency, hematuria, flank pain, discharge, difficulty urinating and genital sores.  Musculoskeletal: Negative for myalgias, back pain, joint swelling, arthralgias and gait problem.  Skin: Negative for rash.  Neurological: Negative for dizziness, syncope, speech difficulty, weakness, numbness and headaches.  Hematological: Negative for adenopathy. Does not bruise/bleed easily.  Psychiatric/Behavioral: Negative for behavioral problems and dysphoric mood. The patient is not nervous/anxious  Blood pressure 138/72, pulse  68, temperature 97.8 F (36.6 C), temperature source Temporal, resp. rate 16, height 6\' 2"  (1.88 m), weight 306 lb (138.801 kg).  Physical Exam Physical Exam Obese WM in NAD Rectal - palpable mass with external opening on the left side; no surrounding erythema; minimal drainage Data Reviewed None Assessment    Thrombosed external hemorrhoid - spontaneously ruptured    Plan    Maintain regular bowel movements Sitz baths PRN Use tucks pads or some type of wet wipes after bowel movements instead of dry toilet paper Recheck in 3  weeks.       Aspynn Clover K. 03/29/2011, 2:02 PM

## 2011-06-05 ENCOUNTER — Other Ambulatory Visit: Payer: Self-pay | Admitting: Family Medicine

## 2011-06-06 ENCOUNTER — Telehealth: Payer: Self-pay | Admitting: Internal Medicine

## 2011-06-06 MED ORDER — OXYBUTYNIN CHLORIDE ER 15 MG PO TB24: 15.0000 mg | ORAL_TABLET | Freq: Every day | ORAL | Status: DC

## 2011-06-06 NOTE — Telephone Encounter (Addendum)
Pt needs script for oxybutynin (DITROPAN XL) 15 MG 24 hr tablet to [facility]. Pt will be establishing with Dr Eustaquio Boyden in March and so will pts spouse.

## 2011-06-06 NOTE — Telephone Encounter (Signed)
Sent 90 day only - no Rf - can get Rf from new doc on March

## 2011-08-16 ENCOUNTER — Encounter: Payer: Self-pay | Admitting: Internal Medicine

## 2011-08-17 ENCOUNTER — Other Ambulatory Visit: Payer: Self-pay

## 2011-08-17 ENCOUNTER — Encounter: Payer: Self-pay | Admitting: Family Medicine

## 2011-08-17 ENCOUNTER — Encounter (HOSPITAL_COMMUNITY): Payer: Self-pay | Admitting: *Deleted

## 2011-08-17 ENCOUNTER — Emergency Department (HOSPITAL_COMMUNITY)
Admission: EM | Admit: 2011-08-17 | Discharge: 2011-08-17 | Disposition: A | Discharge status: Home / Self Care | Payer: Medicare HMO | Attending: Emergency Medicine | Admitting: Emergency Medicine

## 2011-08-17 ENCOUNTER — Ambulatory Visit (INDEPENDENT_AMBULATORY_CARE_PROVIDER_SITE_OTHER): Payer: Medicare HMO | Admitting: Family Medicine

## 2011-08-17 ENCOUNTER — Telehealth: Payer: Self-pay | Admitting: Radiology

## 2011-08-17 VITALS — BP 116/72 | HR 68 | Temp 97.6°F | Ht 74.0 in | Wt 300.2 lb

## 2011-08-17 DIAGNOSIS — E669 Obesity, unspecified: Secondary | ICD-10-CM

## 2011-08-17 DIAGNOSIS — I350 Nonrheumatic aortic (valve) stenosis: Secondary | ICD-10-CM

## 2011-08-17 DIAGNOSIS — N3941 Urge incontinence: Secondary | ICD-10-CM

## 2011-08-17 DIAGNOSIS — E039 Hypothyroidism, unspecified: Secondary | ICD-10-CM

## 2011-08-17 DIAGNOSIS — E785 Hyperlipidemia, unspecified: Secondary | ICD-10-CM

## 2011-08-17 DIAGNOSIS — R011 Cardiac murmur, unspecified: Secondary | ICD-10-CM

## 2011-08-17 DIAGNOSIS — E875 Hyperkalemia: Secondary | ICD-10-CM | POA: Insufficient documentation

## 2011-08-17 DIAGNOSIS — I10 Essential (primary) hypertension: Secondary | ICD-10-CM

## 2011-08-17 DIAGNOSIS — Z87442 Personal history of urinary calculi: Secondary | ICD-10-CM | POA: Insufficient documentation

## 2011-08-17 DIAGNOSIS — E119 Type 2 diabetes mellitus without complications: Secondary | ICD-10-CM

## 2011-08-17 DIAGNOSIS — I359 Nonrheumatic aortic valve disorder, unspecified: Secondary | ICD-10-CM

## 2011-08-17 DIAGNOSIS — K645 Perianal venous thrombosis: Secondary | ICD-10-CM

## 2011-08-17 DIAGNOSIS — I4891 Unspecified atrial fibrillation: Secondary | ICD-10-CM | POA: Insufficient documentation

## 2011-08-17 DIAGNOSIS — Z125 Encounter for screening for malignant neoplasm of prostate: Secondary | ICD-10-CM

## 2011-08-17 DIAGNOSIS — D649 Anemia, unspecified: Secondary | ICD-10-CM

## 2011-08-17 LAB — LIPID PANEL
Cholesterol: 170 mg/dL (ref 0–200)
LDL Cholesterol: 96 mg/dL (ref 0–99)
Total CHOL/HDL Ratio: 3

## 2011-08-17 LAB — CBC WITH DIFFERENTIAL/PLATELET
Basophils Absolute: 0 10*3/uL (ref 0.0–0.1)
HCT: 41 % (ref 39.0–52.0)
Hemoglobin: 14 g/dL (ref 13.0–17.0)
Lymphs Abs: 0.9 10*3/uL (ref 0.7–4.0)
MCHC: 34.1 g/dL (ref 30.0–36.0)
MCV: 98.3 fl (ref 78.0–100.0)
Monocytes Absolute: 0.8 10*3/uL (ref 0.1–1.0)
Neutro Abs: 5.2 10*3/uL (ref 1.4–7.7)
Platelets: 197 10*3/uL (ref 150.0–400.0)
RDW: 13.3 % (ref 11.5–14.6)

## 2011-08-17 LAB — BASIC METABOLIC PANEL
CO2: 21 mEq/L (ref 19–32)
Chloride: 97 mEq/L (ref 96–112)
GFR calc Af Amer: 71 mL/min — ABNORMAL LOW (ref >90)
Potassium: 5.7 mEq/L — ABNORMAL HIGH (ref 3.5–5.1)
Sodium: 129 mEq/L — ABNORMAL LOW (ref 135–145)

## 2011-08-17 LAB — POCT I-STAT, CHEM 8
BUN: 32 mg/dL — ABNORMAL HIGH (ref 6–23)
Creatinine, Ser: 1.2 mg/dL (ref 0.50–1.35)
Glucose, Bld: 178 mg/dL — ABNORMAL HIGH (ref 70–99)
Sodium: 131 mEq/L — ABNORMAL LOW (ref 135–145)
TCO2: 21 mmol/L (ref 0–100)

## 2011-08-17 LAB — MICROALBUMIN / CREATININE URINE RATIO: Microalb, Ur: 0.7 mg/dL (ref 0.0–1.9)

## 2011-08-17 LAB — COMPREHENSIVE METABOLIC PANEL
ALT: 25 U/L (ref 0–53)
AST: 25 U/L (ref 0–37)
Albumin: 4.2 g/dL (ref 3.5–5.2)
BUN: 35 mg/dL — ABNORMAL HIGH (ref 6–23)
Calcium: 10.4 mg/dL (ref 8.4–10.5)
Chloride: 99 mEq/L (ref 96–112)
Potassium: 6.6 mEq/L (ref 3.5–5.1)
Sodium: 131 mEq/L — ABNORMAL LOW (ref 135–145)
Total Protein: 7.2 g/dL (ref 6.0–8.3)

## 2011-08-17 MED ORDER — METFORMIN HCL 1000 MG PO TABS: 1000.0000 mg | ORAL_TABLET | Freq: Two times a day (BID) | ORAL | Status: DC

## 2011-08-17 MED ORDER — SODIUM POLYSTYRENE SULFONATE 15 GM/60ML PO SUSP
15.0000 g | Freq: Once | ORAL | Status: AC
  Administered 2011-08-17: 15 g via ORAL
  Filled 2011-08-17: qty 60

## 2011-08-17 MED ORDER — IBUPROFEN 800 MG PO TABS: 800.0000 mg | ORAL_TABLET | Freq: Three times a day (TID) | ORAL | Status: DC | PRN

## 2011-08-17 MED ORDER — TRIAMTERENE-HCTZ 75-50 MG PO TABS: 1.0000 | ORAL_TABLET | Freq: Every day | ORAL | Status: DC

## 2011-08-17 MED ORDER — GLIPIZIDE 10 MG PO TABS: 10.0000 mg | ORAL_TABLET | Freq: Every day | ORAL | Status: DC

## 2011-08-17 MED ORDER — OXYBUTYNIN CHLORIDE ER 15 MG PO TB24: 15.0000 mg | ORAL_TABLET | Freq: Every day | ORAL | Status: DC

## 2011-08-17 MED ORDER — ENALAPRIL MALEATE 10 MG PO TABS: 10.0000 mg | ORAL_TABLET | Freq: Every day | ORAL | Status: DC

## 2011-08-17 NOTE — ED Notes (Addendum)
Pt states he went to the doctors office this am for his annual check up and was told to come to the ED for eval of critical high potassium. Pt denies any complaints. Denies chest pain or palpitations, potassium noted to be 6.6 according to labs drawn this am in [facility].

## 2011-08-17 NOTE — Patient Instructions (Signed)
Good to meet you today, call us with questions. Blood work today. Return at your convenience for wellness visit.

## 2011-08-17 NOTE — ED Provider Notes (Signed)
See prior note   Ward Givens, MD 08/17/11 2314

## 2011-08-17 NOTE — Discharge Instructions (Signed)
Stop your Vasotec until your doctor tells you to restart. Your Potassium here is 5.7, which is mildly elevated. If you start to feel uneasy, or have shortness of breath, chest pain or muscle cramps return to the emergency department. Otherwise follow up with your doctor for blood pressure medicine changes.  Hyperkalemia Hyperkalemia is when you have too much potassium in your blood. This can be a life-threatening condition. Potassium is normally removed (excreted) from the body by the kidneys. CAUSES  The potassium level in your body can become too high for the following reasons:  You take in too much potassium. You can do this by:   Using salt substitutes. They contain large amounts of potassium.   Taking potassium supplements from your caregiver. The dose may be too high for you.   Eating foods or taking nutritional products with potassium.   You excrete too little potassium. This can happen if:   Your kidneys are not functioning properly. Kidney (renal) disease is a very common cause of hyperkalemia.   You are taking medicines that lower your excretion of potassium, such as certain diuretic medicines.   You have an adrenal gland disease called Addison's disease.   You have a urinary tract obstruction, such as kidney stones.   You are on treatment to mechanically clean your blood (dialysis) and you skip a treatment.   You release a high amount of potassium from your cells into your blood. You may have a condition that causes potassium to move from your cells to your bloodstream. This can happen with:   Injury to muscles or other tissues. Most potassium is stored in the muscles.   Severe burns or infections.   Acidic blood plasma (acidosis). Acidosis can result from many diseases, such as uncontrolled diabetes.  SYMPTOMS  Usually, there are no symptoms unless the potassium is dangerously high or has risen very quickly. Symptoms may include:  Irregular or very slow heartbeat.    Feeling sick to your stomach (nauseous).   Tiredness (fatigue).   Nerve problems such as tingling of the skin, numbness of the hands or feet, weakness, or paralysis.  DIAGNOSIS  A simple blood test can measure the amount of potassium in your body. An electrocardiogram test of the heart can also help make the diagnosis. The heart may beat dangerously fast or slow down and stop beating with severe hyperkalemia.  TREATMENT  Treatment depends on how bad the condition is and on the underlying cause.  If the hyperkalemia is an emergency (causing heart problems or paralysis), many different medicines can be used alone or together to lower the potassium level briefly. This may include an insulin injection even if you are not diabetic. Emergency dialysis may be needed to remove potassium from the body.   If the hyperkalemia is less severe or dangerous, the underlying cause is treated. This can include taking medicines if needed. Your prescription medicines may be changed. You may also need to take a medicine to help your body get rid of potassium. You may need to eat a diet low in potassium.  HOME CARE INSTRUCTIONS   Take medicines and supplements as directed by your caregiver.   Do not take any over-the-counter medicines, supplements, natural products, herbs, or vitamins without reviewing them with your caregiver. Certain supplements and natural food products can have high amounts of potassium. Other products (such as ibuprofen) can damage weak kidneys and raise your potassium.   You may be asked to do repeat lab tests. Be sure  to follow these directions.   If you have kidney disease, you may need to follow a low potassium diet.  SEEK MEDICAL CARE IF:   You notice an irregular or very slow heartbeat.   You feel lightheaded.   You develop weakness that is unusual for you.  SEEK IMMEDIATE MEDICAL CARE IF:   You have shortness of breath.   You have chest discomfort.   You pass out  (faint).  MAKE SURE YOU:   Understand these instructions.   Will watch your condition.   Will get help right away if you are not doing well or get worse.  Document Released: 04/28/2002 Document Revised: 04/27/2011 Document Reviewed: 10/13/2010 [facility] Patient Information 2012 [place], [place].

## 2011-08-17 NOTE — Telephone Encounter (Signed)
Please notify potassium level came back very high - it is dangerously high!  Also kidney function returned mildly elevated which could partly account for this.  Recommend go to ER right away to be evaluated for this as may need medicine to lower it.

## 2011-08-17 NOTE — ED Provider Notes (Signed)
Pt relates he had a routine appt yesterday and was feeling okay, no new problems. He was called b/o his K being high.  He denies any new symptoms.   Pt is alert, cooperative and in NAD  I saw and evaluated the patient, reviewed the resident's note and I agree with the findings and plan. Devoria Albe, MD, [facility]   Ward Givens, MD 08/17/11 [phone]

## 2011-08-17 NOTE — Progress Notes (Signed)
Subjective:    Patient ID: Bill [name], male    DOB: Sep 10, 1940, 71 y.o.   MRN: [id number]  HPI CC: transfer care from [place]  Would like to transfer as closer to home.  H/o lyme disease last year, treated.  Since then getting SOB.  Had several tests done (EKG, CXR) as well as echo which returned normal.  Stays some SOB.  HTN - compliant with meds.  No HA, vision changes, CP/tightness, leg swelling.  Leg cramps controlled with bar of soap under sheets.  Last K check normal.  DM - dx 1980s.  Fasting sugars 80-110.  Denies low sugars.  No paresthesias in fingers or toes.  Last vision screen was ~02/2011.  Last foot exam - unsure. Lab Results  Component Value Date   HGBA1C 6.3 12/27/2010    Low back pain - tylenol didn't help.  Takes ibuprofen 800mg  2-3x/day regularly.  Stopped aspirin 2/2 ibuprofen use.  I'm not sure if he's tried narcotics/tramadol.  Preventative: Last CPE was 07/2010. Colonoscopy 2002, told normal, rpt 10 yrs. Flu shot 2012. Pneumonia shot 2007. Tetanus shot - unsure. zostavax - has not received.  Medications and allergies reviewed and updated in chart.  Past histories reviewed and updated if relevant as below. Patient Active Problem List  Diagnoses  . HYPOTHYROIDISM  . DIABETES MELLITUS, TYPE II  . VITAMIN D DEFICIENCY  . HYPERLIPIDEMIA  . Obesity, unspecified  . ANEMIA  . HYPERTENSION  . EDEMA  . FALSE POSITIVE SEROLOGICAL TEST FOR SYPHILIS  . UNS ADVRS EFF OTH RX MEDICINAL&BIOLOGICAL SBSTNC  . BENIGN PROSTATIC HYPERTROPHY, HX OF  . Abnormal EKG  . Aortic stenosis  . Thrombosed external hemorrhoid   Past Medical History  Diagnosis Date  . Hypertension   . T2DM (type 2 diabetes mellitus) 1980s  . Exogenous obesity   . Urge incontinence   . Lyme disease 2012   Past Surgical History  Procedure Date  . Kidney stone removal   . Finger sx   . Back surgery     removal of lower back cartilage due to damage. Patient does not remember date of  surgery.   History  Substance Use Topics  . Smoking status: Never Smoker   . Smokeless tobacco: Never Used  . Alcohol Use: Yes     Regular (bourbon and coke 3-4/day)   No family history on file. No Known Allergies Current Outpatient Prescriptions on File Prior to Visit  Medication Sig Dispense Refill  . Lancet Devices (SOFT TOUCH LANCET DEVICE) MISC 100 strips by Does not apply route 2 (two) times daily.  3 each  3  . DISCONTD: enalapril (VASOTEC) 10 MG tablet Take 1 tablet (10 mg total) by mouth daily.  90 tablet  1  . DISCONTD: glipiZIDE (GLUCOTROL) 10 MG tablet Take 10 mg by mouth daily.        Marland Kitchen DISCONTD: metFORMIN (GLUCOPHAGE) 1000 MG tablet Take 1,000 mg by mouth daily.        Marland Kitchen DISCONTD: oxybutynin (DITROPAN XL) 15 MG 24 hr tablet Take 1 tablet (15 mg total) by mouth daily.  90 tablet  0  . DISCONTD: triamterene-hydrochlorothiazide (MAXZIDE) 75-50 MG per tablet Take 1 tablet by mouth daily.           Review of Systems  Constitutional: Negative for fever, chills, activity change, appetite change, fatigue and unexpected weight change.  HENT: Negative for hearing loss and neck pain.   Eyes: Negative for visual disturbance.  Respiratory: Positive for shortness of  breath. Negative for cough, chest tightness and wheezing.   Cardiovascular: Negative for chest pain, palpitations and leg swelling.  Gastrointestinal: Negative for nausea, vomiting, abdominal pain, diarrhea, constipation, blood in stool and abdominal distention.  Genitourinary: Negative for hematuria and difficulty urinating.  Musculoskeletal: Negative for myalgias and arthralgias.  Skin: Negative for rash.  Neurological: Negative for dizziness, seizures, syncope and headaches.  Hematological: Does not bruise/bleed easily.  Psychiatric/Behavioral: Negative for dysphoric mood. The patient is not nervous/anxious.        Objective:   Physical Exam  Nursing note and vitals reviewed. Constitutional: He is oriented to  person, place, and time. He appears well-developed and well-nourished. No distress.       obese  HENT:  Head: Normocephalic and atraumatic.  Right Ear: External ear normal.  Left Ear: External ear normal.  Nose: Nose normal.  Mouth/Throat: Oropharynx is clear and moist. No oropharyngeal exudate.  Eyes: Conjunctivae and EOM are normal. Pupils are equal, round, and reactive to light. No scleral icterus.  Neck: Normal range of motion. Neck supple. Carotid bruit is not present. No thyromegaly present.  Cardiovascular: Normal rate, regular rhythm and intact distal pulses.   Murmur (3/6 SEM) heard. Pulses:      Radial pulses are 2+ on the right side, and 2+ on the left side.  Pulmonary/Chest: Effort normal and breath sounds normal. No respiratory distress. He has no wheezes. He has no rales.  Abdominal: Soft. Bowel sounds are normal. He exhibits no distension and no mass. There is no tenderness. There is no rebound and no guarding.  Musculoskeletal: Normal range of motion. He exhibits no edema.       Diabetic foot exam: Normal inspection No skin breakdown No calluses  3+ DP pulses, diminished PT Normal sensation to light touch and monofilament L, decreased mildly on R Nails normal  Lymphadenopathy:    He has no cervical adenopathy.  Neurological: He is alert and oriented to person, place, and time.       CN grossly intact, station and gait intact  Skin: Skin is warm and dry. No rash noted.  Psychiatric: He has a normal mood and affect. His behavior is normal. Judgment and thought content normal.       Assessment & Plan:

## 2011-08-17 NOTE — ED Provider Notes (Signed)
History     CSN: [id number]  Arrival date & time 08/17/11  1508   First MD Initiated Contact with Patient 08/17/11 1532      Chief Complaint  Patient presents with  . Abnormal Lab    (Consider location/radiation/quality/duration/timing/severity/associated sxs/prior treatment) HPI Comments: 71 yo male presents to ED after being called in by his PCP. Went to PCP for annual checkup and labs today and was called because his K was elevated (6.6). Cr slightly bumped at 1.4 as well. Patient otherwise has been feeling well, denies any chest pain, shortness of breath, abd pain, vomiting, diarrhea or increased or decreased urinary output from his normal. No recent illness, cough, fever or URI symptoms. No muscle cramps  The history is provided by the patient.    Past Medical History  Diagnosis Date  . Hypertension   . T2DM (type 2 diabetes mellitus) 1980s  . Exogenous obesity   . Urge incontinence   . Lyme disease 2012  . History of chicken pox   . Kidney stones remote    Past Surgical History  Procedure Date  . Kidney stone removal   . Finger sx   . Back surgery     removal of lower back cartilage due to damage. Patient does not remember date of surgery.    Family History  Problem Relation Age of Onset  . Diabetes Father   . Diabetes Mother   . Heart disease Father   . Heart disease Mother   . Cancer Brother     throat    History  Substance Use Topics  . Smoking status: Never Smoker   . Smokeless tobacco: Never Used  . Alcohol Use: Yes     Regular (bourbon and coke 3-4/day)      Review of Systems  Constitutional: Negative for fever and chills.  HENT: Negative for congestion and rhinorrhea.   Respiratory: Negative for cough and shortness of breath.   Cardiovascular: Negative for chest pain and leg swelling.  Gastrointestinal: Negative for nausea, vomiting, abdominal pain, diarrhea, constipation and blood in stool.  Genitourinary: Negative for dysuria, urgency  and decreased urine volume.  Neurological: Negative for weakness, numbness and headaches.  Psychiatric/Behavioral: Negative for confusion.  All other systems reviewed and are negative.    Allergies  Review of patient's allergies indicates no known allergies.  Home Medications   Current Outpatient Rx  Name Route Sig Dispense Refill  . ENALAPRIL MALEATE 10 MG PO TABS Oral Take 1 tablet (10 mg total) by mouth daily. 90 tablet 3  . GLIPIZIDE 10 MG PO TABS Oral Take 1 tablet (10 mg total) by mouth daily. 90 tablet 3  . IBUPROFEN 800 MG PO TABS Oral Take 800 mg by mouth 3 (three) times daily as needed. As needed for pain.    . IRON PO Oral Take 1 tablet by mouth daily.    Marland Kitchen METFORMIN HCL 1000 MG PO TABS Oral Take 1 tablet (1,000 mg total) by mouth 2 (two) times daily with a meal. 180 tablet 3  . MULTIVITAMIN PO Oral Take 5 tablets by mouth daily. Multivitamin packet containing 5 tablets.    . OXYBUTYNIN CHLORIDE ER 15 MG PO TB24 Oral Take 1 tablet (15 mg total) by mouth daily. 90 tablet 3  . TRIAMTERENE-HCTZ 75-50 MG PO TABS Oral Take 1 tablet by mouth daily. 90 tablet 3  . ACCU-CHEK SOFT TOUCH DEVICE MISC Does not apply 100 strips by Does not apply route 2 (two) times daily. 3  each 3    BP 158/58  Pulse 70  Temp(Src) 98.2 F (36.8 C) (Oral)  SpO2 100%  Physical Exam  Nursing note and vitals reviewed. Constitutional: He is oriented to person, place, and time. He appears well-developed and well-nourished.       Obese  HENT:  Head: Normocephalic and atraumatic.  Right Ear: External ear normal.  Left Ear: External ear normal.  Nose: Nose normal.  Neck: Neck supple.  Cardiovascular: Normal rate, regular rhythm, normal heart sounds and intact distal pulses.   Pulmonary/Chest: Effort normal and breath sounds normal. No respiratory distress. He has no wheezes. He has no rales.  Abdominal: Soft. He exhibits no distension and no mass. There is no tenderness. There is no rebound and no  guarding.       obese  Musculoskeletal: He exhibits no edema.  Lymphadenopathy:    He has no cervical adenopathy.  Neurological: He is alert and oriented to person, place, and time.  Skin: Skin is warm and dry. No pallor.    ED Course  Procedures (including critical care time)  Labs Reviewed  POCT I-STAT, CHEM 8 - Abnormal; Notable for the following:    Sodium 131 (*)    Potassium 5.7 (*)    BUN 32 (*)    Glucose, Bld 178 (*)    Calcium, Ion 1.37 (*)    All other components within normal limits  BASIC METABOLIC PANEL - Abnormal; Notable for the following:    Sodium 129 (*)    Potassium 5.7 (*)    Glucose, Bld 170 (*)    BUN 30 (*)    GFR calc non Af Amer 61 (*)    GFR calc Af Amer 71 (*)    All other components within normal limits   No results found.   Date: 08/17/2011  Rate: 67  Rhythm: atrial fibrillation  QRS Axis: left  Intervals: normal  ST/T Wave abnormalities: normal  Conduction Disutrbances:none  Narrative Interpretation: No evidence of elevated T waves or widening of QRS  Old EKG Reviewed: none available     1. Hyperkalemia       MDM  71 yo male sent from PCP due to hyperkalemia on CMP taken at annual visit today. Appears well, no signs of illness. EKG shows no evidence of hyperkalemia. K is elevated here mildly at 5.7. With no prior history and no signs of cardiac issues with hyperkalemia, will recommend patient stop his ACE inhibitor and follow up with PCP in next 1-2 days. Will give kayexalate in the ED to help lower potassium as well. No other obvious cause for hyperkalemia (no renal failure). Due to patient appearing well and can get close follow up, will d/c home with precautions.        Pricilla Loveless, MD 08/17/11 [phone]

## 2011-08-17 NOTE — Telephone Encounter (Signed)
Patient advised and will go to [facility]

## 2011-08-17 NOTE — Telephone Encounter (Signed)
[facility] lab called a critical result, K+ 6.6, sample was not hemolyzed.

## 2011-08-18 ENCOUNTER — Encounter: Payer: Self-pay | Admitting: Family Medicine

## 2011-08-18 DIAGNOSIS — N3941 Urge incontinence: Secondary | ICD-10-CM | POA: Insufficient documentation

## 2011-08-18 NOTE — Assessment & Plan Note (Signed)
Resolved on its own after spontaneously drained.

## 2011-08-18 NOTE — Assessment & Plan Note (Signed)
Mild on echo 12/2010.  Continue to monitor.  Baseline small amt dyspnea but not recently worsening.

## 2011-08-18 NOTE — Assessment & Plan Note (Signed)
FLP today.  Goal LDL <100 given diabetic

## 2011-08-18 NOTE — Assessment & Plan Note (Signed)
Chronic, stable.  Foot exam today. Check blood work today. Continue meds. On max metformin and glipizide 10mg  daily.

## 2011-08-18 NOTE — Assessment & Plan Note (Signed)
Chronic, stable Great control on current regimen - continue meds.

## 2011-08-18 NOTE — Assessment & Plan Note (Signed)
Continue to encourage weight loss through diet and exercise

## 2011-08-18 NOTE — Assessment & Plan Note (Signed)
Check TSH today. Not currently on thyroid medication.  ?dx on list.  May need to remove.  Not on PMH in chart.

## 2011-08-23 ENCOUNTER — Encounter: Payer: Self-pay | Admitting: Family Medicine

## 2011-08-23 ENCOUNTER — Ambulatory Visit (INDEPENDENT_AMBULATORY_CARE_PROVIDER_SITE_OTHER): Payer: Medicare HMO | Admitting: Family Medicine

## 2011-08-23 ENCOUNTER — Other Ambulatory Visit: Payer: Self-pay | Admitting: Family Medicine

## 2011-08-23 VITALS — BP 132/82 | HR 72 | Temp 97.5°F | Ht 74.0 in | Wt 299.2 lb

## 2011-08-23 DIAGNOSIS — E559 Vitamin D deficiency, unspecified: Secondary | ICD-10-CM

## 2011-08-23 DIAGNOSIS — Z23 Encounter for immunization: Secondary | ICD-10-CM

## 2011-08-23 DIAGNOSIS — E785 Hyperlipidemia, unspecified: Secondary | ICD-10-CM

## 2011-08-23 DIAGNOSIS — E875 Hyperkalemia: Secondary | ICD-10-CM | POA: Insufficient documentation

## 2011-08-23 DIAGNOSIS — E119 Type 2 diabetes mellitus without complications: Secondary | ICD-10-CM

## 2011-08-23 DIAGNOSIS — Z0000 Encounter for general adult medical examination without abnormal findings: Secondary | ICD-10-CM | POA: Insufficient documentation

## 2011-08-23 DIAGNOSIS — D649 Anemia, unspecified: Secondary | ICD-10-CM

## 2011-08-23 DIAGNOSIS — I10 Essential (primary) hypertension: Secondary | ICD-10-CM

## 2011-08-23 DIAGNOSIS — G8929 Other chronic pain: Secondary | ICD-10-CM

## 2011-08-23 DIAGNOSIS — M545 Low back pain, unspecified: Secondary | ICD-10-CM

## 2011-08-23 DIAGNOSIS — Z1211 Encounter for screening for malignant neoplasm of colon: Secondary | ICD-10-CM

## 2011-08-23 DIAGNOSIS — I350 Nonrheumatic aortic (valve) stenosis: Secondary | ICD-10-CM

## 2011-08-23 LAB — POC HEMOCCULT BLD/STL (OFFICE/1-CARD/DIAGNOSTIC): Fecal Occult Blood, POC: NEGATIVE

## 2011-08-23 LAB — POTASSIUM: Potassium: 5.7 mEq/L — ABNORMAL HIGH (ref 3.5–5.1)

## 2011-08-23 MED ORDER — SODIUM POLYSTYRENE SULFONATE PO POWD: ORAL | Status: AC

## 2011-08-23 MED ORDER — TRAMADOL HCL 50 MG PO TABS: 50.0000 mg | ORAL_TABLET | Freq: Two times a day (BID) | ORAL | Status: AC | PRN

## 2011-08-23 MED ORDER — TRIAMTERENE-HCTZ 37.5-25 MG PO TABS: 1.0000 | ORAL_TABLET | Freq: Every day | ORAL | Status: DC

## 2011-08-23 NOTE — Assessment & Plan Note (Signed)
Check K today.  Continue to hold enalapril. Decreased maxzide to 25/37.5 dose.

## 2011-08-23 NOTE — Assessment & Plan Note (Signed)
Chronic, controlled with ibuprofen. Tylenol doesn't help Trial of tramadol, see if able to slowly back off NSAIDs, then rec start baby ASA.

## 2011-08-23 NOTE — Assessment & Plan Note (Signed)
Chronic, well controlled. continue meds.

## 2011-08-23 NOTE — Assessment & Plan Note (Signed)
Somewhat elevated today (off enalapril). Advised keep log and update me if consistently >130/80.

## 2011-08-23 NOTE — Assessment & Plan Note (Signed)
Resolved. Will remove from active problem list.

## 2011-08-23 NOTE — Progress Notes (Signed)
Addended by: Josph Macho A on: 08/23/2011 09:45 AM   Modules accepted: Orders

## 2011-08-23 NOTE — Patient Instructions (Addendum)
Cut maxzide in half when you take it.  Take daily.  Hold enalapril for now.  Keep eye on blood pressure at home, if consistently running >130/80, let me know. Call your insurace about the shingles shot to see if it is covered or how much it would cost and where is cheaper (here or pharmacy).  If you want to receive here, call for nurse visit. Td today (tetanus) Stool kit today. Try tramadol for back pain - like a narcotic.  If able to take this, back off ibuprofen. If able to stop ibuprofen, start baby aspirin daily (81mg ) Good to see you today, call us with questions.

## 2011-08-23 NOTE — Progress Notes (Signed)
Subjective:    Patient ID: Bill [name], male    DOB: 07/08/1940, 71 y.o.   MRN: [id number]  HPI CC: medicare wellness  Seen here last week as transfer from Dr. Scotty Court.  Blood work returned with K 6.6, sent to ER and Potassium did drop to 5.6 prior to discharge.  Sent home with kayexalate and holding enalapril.  Was on maxzide 75/50 daily prn swelling  Chronic ibuprofen 800mg  for lower back pain.  Takes 2-3 times daily.  Preventative:  Last CPE was 07/2010. Colonoscopy 2002, told normal, rpt 10 yrs. Would like yearly stool kits.  H/o hemorrhoids. Prostate screening - always normal. Flu shot 2012. Pneumonia shot 2007. Tetanus shot - unsure.  Td today. zostavax - has not received.  Hearing doing well. Vision - trouble L eye - last screen was 02/2011 Marlyne Beards).  Medications and allergies reviewed and updated in chart.  Past histories reviewed and updated if relevant as below. Patient Active Problem List  Diagnoses  . DIABETES MELLITUS, TYPE II  . VITAMIN D DEFICIENCY  . HYPERLIPIDEMIA  . Obesity, unspecified  . ANEMIA  . HYPERTENSION  . EDEMA  . FALSE POSITIVE SEROLOGICAL TEST FOR SYPHILIS  . BENIGN PROSTATIC HYPERTROPHY, HX OF  . Abnormal EKG  . Aortic stenosis  . Urge incontinence   Past Medical History  Diagnosis Date  . Hypertension   . T2DM (type 2 diabetes mellitus) 1980s  . Exogenous obesity   . Urge incontinence   . Lyme disease 2012    ?titers negative  . History of chicken pox   . Kidney stones remote  . Mild aortic stenosis 12/2010   Past Surgical History  Procedure Date  . Kidney stone removal   . Finger sx   . Back surgery     removal of lower back cartilage due to damage. Patient does not remember date of surgery.  . US echocardiography 12/2010    EF 50-55%, mild LVH, normal wall motion, high LV filling pressures, mild AS, LA mildly dilated   History  Substance Use Topics  . Smoking status: Never Smoker   . Smokeless tobacco: Never Used  .  Alcohol Use: Yes     Regular (bourbon and coke 3-4/day)   Family History  Problem Relation Age of Onset  . Diabetes Father   . Diabetes Mother   . Heart disease Father   . Heart disease Mother   . Cancer Brother     throat   No Known Allergies Current Outpatient Prescriptions on File Prior to Visit  Medication Sig Dispense Refill  . glipiZIDE (GLUCOTROL) 10 MG tablet Take 1 tablet (10 mg total) by mouth daily.  90 tablet  3  . ibuprofen (ADVIL,MOTRIN) 800 MG tablet Take 800 mg by mouth 3 (three) times daily as needed. As needed for pain.      Demetra Shiner Devices (SOFT TOUCH LANCET DEVICE) MISC 100 strips by Does not apply route 2 (two) times daily.  3 each  3  . metFORMIN (GLUCOPHAGE) 1000 MG tablet Take 1 tablet (1,000 mg total) by mouth 2 (two) times daily with a meal.  180 tablet  3  . Multiple Vitamins-Minerals (MULTIVITAMIN PO) Take 5 tablets by mouth daily. Multivitamin packet containing 5 tablets.      Marland Kitchen oxybutynin (DITROPAN XL) 15 MG 24 hr tablet Take 1 tablet (15 mg total) by mouth daily.  90 tablet  3  . DISCONTD: triamterene-hydrochlorothiazide (MAXZIDE) 75-50 MG per tablet Take 1 tablet by mouth daily.  90 tablet  3  . enalapril (VASOTEC) 10 MG tablet Take 1 tablet (10 mg total) by mouth daily.  90 tablet  3     Review of Systems  Constitutional: Negative for fever, chills, activity change, appetite change, fatigue and unexpected weight change.  HENT: Negative for hearing loss and neck pain.   Eyes: Negative for visual disturbance.  Respiratory: Positive for shortness of breath. Negative for cough, chest tightness and wheezing.   Cardiovascular: Negative for chest pain, palpitations and leg swelling.  Gastrointestinal: Negative for nausea, vomiting, abdominal pain, diarrhea, constipation, blood in stool and abdominal distention.  Genitourinary: Negative for hematuria and difficulty urinating.  Musculoskeletal: Negative for myalgias and arthralgias.  Skin: Negative for rash.   Neurological: Negative for dizziness, seizures, syncope and headaches.  Hematological: Does not bruise/bleed easily.  Psychiatric/Behavioral: Negative for dysphoric mood. The patient is not nervous/anxious.        Objective:   Physical Exam  Nursing note and vitals reviewed. Constitutional: He is oriented to person, place, and time. He appears well-developed and well-nourished. No distress.  HENT:  Head: Normocephalic and atraumatic.  Right Ear: External ear normal.  Left Ear: External ear normal.  Nose: Nose normal.  Mouth/Throat: Oropharynx is clear and moist. No oropharyngeal exudate.  Eyes: Conjunctivae and EOM are normal. Pupils are equal, round, and reactive to light. No scleral icterus.  Neck: Normal range of motion. Neck supple. No thyromegaly present.  Cardiovascular: Normal rate, regular rhythm and intact distal pulses.   Murmur (2/6 SEM best at LUSB) heard. Pulses:      Radial pulses are 2+ on the right side, and 2+ on the left side.  Pulmonary/Chest: Effort normal and breath sounds normal. No respiratory distress. He has no wheezes. He has no rales.  Abdominal: Soft. Bowel sounds are normal. He exhibits no distension and no mass. There is no tenderness. There is no rebound and no guarding.  Genitourinary: Prostate normal. Rectal exam shows external hemorrhoid (noninflamed). Rectal exam shows no internal hemorrhoid, no fissure, no mass, no tenderness and anal tone normal. Guaiac negative stool. Prostate is not enlarged (~20gm) and not tender.  Musculoskeletal: Normal range of motion. He exhibits no edema.  Lymphadenopathy:    He has no cervical adenopathy.  Neurological: He is alert and oriented to person, place, and time.       CN grossly intact, station and gait intact  Skin: Skin is warm and dry. No rash noted.  Psychiatric: He has a normal mood and affect. His behavior is normal. Judgment and thought content normal.      Assessment & Plan:

## 2011-08-23 NOTE — Assessment & Plan Note (Signed)
I have personally reviewed the Medicare Annual Wellness questionnaire and have noted 1. The patient's medical and social history 2. Their use of alcohol, tobacco or illicit drugs 3. Their current medications and supplements 4. The patient's functional ability including ADL's, fall risks, home safety risks and hearing or visual impairment. 5. Diet and physical activities 6. Evidence for depression or mood disorders The patients weight, height, BMI have been recorded in the chart.  Hearing and vision has been addressed. I have made referrals, counseling and provided education to the patient based review of the above and I have provided the pt with a written personalized care plan for preventive services.  Td today. iFOB today.  Declines rpt colonoscopy. DRE/PSA reassuring today. Last vision screen 02/2011.  Worsening L eye vision, sees Dr. Marlyne Beards.

## 2011-08-23 NOTE — Assessment & Plan Note (Signed)
At goal off meds.  Continue to monitor.

## 2011-08-30 ENCOUNTER — Other Ambulatory Visit (INDEPENDENT_AMBULATORY_CARE_PROVIDER_SITE_OTHER): Payer: Medicare HMO

## 2011-08-30 ENCOUNTER — Other Ambulatory Visit: Payer: Self-pay | Admitting: Family Medicine

## 2011-08-30 ENCOUNTER — Encounter: Payer: Self-pay | Admitting: *Deleted

## 2011-08-30 ENCOUNTER — Other Ambulatory Visit: Payer: Medicare HMO

## 2011-08-30 DIAGNOSIS — E875 Hyperkalemia: Secondary | ICD-10-CM

## 2011-08-30 DIAGNOSIS — Z1211 Encounter for screening for malignant neoplasm of colon: Secondary | ICD-10-CM

## 2011-08-30 LAB — POTASSIUM: Potassium: 5.3 mEq/L — ABNORMAL HIGH (ref 3.5–5.1)

## 2011-08-30 LAB — FECAL OCCULT BLOOD, IMMUNOCHEMICAL: Fecal Occult Bld: NEGATIVE

## 2011-10-02 ENCOUNTER — Other Ambulatory Visit: Payer: Self-pay | Admitting: Family Medicine

## 2011-10-02 ENCOUNTER — Other Ambulatory Visit (INDEPENDENT_AMBULATORY_CARE_PROVIDER_SITE_OTHER): Payer: Medicare HMO

## 2011-10-02 DIAGNOSIS — E611 Iron deficiency: Secondary | ICD-10-CM

## 2011-10-02 DIAGNOSIS — E875 Hyperkalemia: Secondary | ICD-10-CM

## 2011-10-02 DIAGNOSIS — R799 Abnormal finding of blood chemistry, unspecified: Secondary | ICD-10-CM

## 2011-10-02 DIAGNOSIS — D649 Anemia, unspecified: Secondary | ICD-10-CM

## 2011-10-02 LAB — BASIC METABOLIC PANEL
CO2: 21 mEq/L (ref 19–32)
Calcium: 9.4 mg/dL (ref 8.4–10.5)
Creatinine, Ser: 1.3 mg/dL (ref 0.4–1.5)
GFR: 59.64 mL/min — ABNORMAL LOW (ref >60.00)
Sodium: 130 mEq/L — ABNORMAL LOW (ref 135–145)

## 2011-10-02 MED ORDER — SODIUM POLYSTYRENE SULFONATE PO POWD: ORAL | Status: DC

## 2011-10-17 ENCOUNTER — Other Ambulatory Visit (INDEPENDENT_AMBULATORY_CARE_PROVIDER_SITE_OTHER): Payer: Medicare HMO

## 2011-10-17 DIAGNOSIS — E875 Hyperkalemia: Secondary | ICD-10-CM

## 2011-10-17 LAB — BASIC METABOLIC PANEL
BUN: 23 mg/dL (ref 6–23)
Chloride: 100 mEq/L (ref 96–112)
GFR: 53.69 mL/min — ABNORMAL LOW (ref >60.00)
Potassium: 5.6 mEq/L — ABNORMAL HIGH (ref 3.5–5.1)
Sodium: 135 mEq/L (ref 135–145)

## 2011-10-19 ENCOUNTER — Other Ambulatory Visit: Payer: Self-pay | Admitting: Family Medicine

## 2011-10-19 MED ORDER — TRAMADOL HCL 50 MG PO TABS: 50.0000 mg | ORAL_TABLET | Freq: Two times a day (BID) | ORAL | Status: AC | PRN

## 2011-11-03 ENCOUNTER — Telehealth: Payer: Self-pay | Admitting: *Deleted

## 2011-11-03 DIAGNOSIS — E875 Hyperkalemia: Secondary | ICD-10-CM

## 2011-11-03 DIAGNOSIS — I10 Essential (primary) hypertension: Secondary | ICD-10-CM

## 2011-11-03 NOTE — Telephone Encounter (Signed)
Patient called and said the vicodin hasn't really helped much. He said the regular ibuprofen helps much better. He said he even tried one of his wife's vicodin's (10-650) and it didn't help as much as the ibuprofen. Any other suggestions? He is aware you will not be in the office until next week and he is fine until then.

## 2011-11-04 NOTE — Telephone Encounter (Signed)
May do ibuprofen for now.  Does he need script?  Will discuss options at f/u visit next month.  I would like him to come in a few days prior to appt for blood work to recheck potassium and kidneys.  If potassium staying elevated, will change bp meds.

## 2011-11-06 NOTE — Telephone Encounter (Signed)
Patient notified. No script needed. Lab appt scheduled.

## 2011-11-22 ENCOUNTER — Other Ambulatory Visit (INDEPENDENT_AMBULATORY_CARE_PROVIDER_SITE_OTHER): Payer: Medicare HMO

## 2011-11-22 DIAGNOSIS — E875 Hyperkalemia: Secondary | ICD-10-CM

## 2011-11-22 DIAGNOSIS — I10 Essential (primary) hypertension: Secondary | ICD-10-CM

## 2011-11-22 LAB — RENAL FUNCTION PANEL
CO2: 23 mEq/L (ref 19–32)
Creatinine, Ser: 1.2 mg/dL (ref 0.4–1.5)
GFR: 61.3 mL/min (ref >60.00)
Potassium: 5.4 mEq/L — ABNORMAL HIGH (ref 3.5–5.1)
Sodium: 135 mEq/L (ref 135–145)

## 2011-11-27 ENCOUNTER — Ambulatory Visit: Payer: Medicare HMO | Admitting: Family Medicine

## 2011-11-29 ENCOUNTER — Ambulatory Visit (INDEPENDENT_AMBULATORY_CARE_PROVIDER_SITE_OTHER): Payer: Medicare HMO | Admitting: Family Medicine

## 2011-11-29 ENCOUNTER — Encounter: Payer: Self-pay | Admitting: Family Medicine

## 2011-11-29 VITALS — BP 140/76 | HR 80 | Temp 98.4°F | Ht 74.0 in | Wt 294.8 lb

## 2011-11-29 DIAGNOSIS — E875 Hyperkalemia: Secondary | ICD-10-CM

## 2011-11-29 DIAGNOSIS — I10 Essential (primary) hypertension: Secondary | ICD-10-CM

## 2011-11-29 DIAGNOSIS — E119 Type 2 diabetes mellitus without complications: Secondary | ICD-10-CM

## 2011-11-29 MED ORDER — AMLODIPINE BESYLATE 5 MG PO TABS: 5.0000 mg | ORAL_TABLET | Freq: Every day | ORAL | Status: DC

## 2011-11-29 NOTE — Assessment & Plan Note (Signed)
If sugar staying low, advised decrease glipizide to 1/2 tab.

## 2011-11-29 NOTE — Assessment & Plan Note (Signed)
Advised stop enalapril. Continue maxzide at 37.5/25mg  daily.

## 2011-11-29 NOTE — Patient Instructions (Addendum)
If sugars staying <60, decrease glipizide to 5mg  (1/2 pill). Stop enalapril. Start amlodipine at 5mg  daily  This will replace enalapril for now.   Keep track of blood pressures at home, update me if staying consistently >140/85. Return in 3 months for follow up. Good to see you today, call us with questions.

## 2011-11-29 NOTE — Progress Notes (Signed)
  Subjective:    Patient ID: Bill [name], male    DOB: 1940/09/17, 71 y.o.   MRN: [id number]  HPI CC: f/u HTN  Presents today for follow up of HTN.  Chronically elevated potassium, prior on ACEI and also on high dose maxzide.  Remaining elevated.  Has been taking ACEI and Maxzide on and off intermittently although asked to stop enalapril 09/2011.  No HA, vision changes, CP/tightness, SOB, leg swelling.  DM - checks daily - fasting sugars last few days 57-60s.  Otherwise well controlled.  Unsure why lows. Lab Results  Component Value Date   HGBA1C 6.9* 08/17/2011    Currently on hydrocodone and amoxicillin for root canal surgery this past Monday.  Known cardiac murmur  Continues to take ibuprofen 800mg  PRN for arthralgias - has tried tylenol, tramadol and vicodin, nothing works as well as ibuprofen. Lab Results  Component Value Date   CREATININE 1.2 11/22/2011    Past Medical History  Diagnosis Date  . Hypertension   . T2DM (type 2 diabetes mellitus) 1980s  . Exogenous obesity   . Urge incontinence   . Lyme disease 2012    ?titers negative  . History of chicken pox   . Kidney stones remote  . Mild aortic stenosis 12/2010    Review of Systems Per HPI    Objective:   Physical Exam  Nursing note and vitals reviewed. Constitutional: He appears well-developed and well-nourished. No distress.       obese  HENT:  Head: Normocephalic and atraumatic.  Mouth/Throat: Oropharynx is clear and moist. No oropharyngeal exudate.  Eyes: Conjunctivae and EOM are normal. Pupils are equal, round, and reactive to light. No scleral icterus.  Neck: Normal range of motion. Neck supple. Carotid bruit is not present.  Cardiovascular: Normal rate, regular rhythm and intact distal pulses.   Murmur (3/6SEM) heard. Pulmonary/Chest: Breath sounds normal. No respiratory distress. He has no wheezes. He has no rales.  Musculoskeletal: He exhibits no edema.  Skin: Skin is warm and dry. No rash noted.        Assessment & Plan:

## 2011-11-29 NOTE — Assessment & Plan Note (Signed)
BP Readings from Last 3 Encounters:  11/29/11 140/76  08/23/11 132/82  08/17/11 115/59  stop enalapril, start amlodipine.  rtc 3 mo for f/u. Chronic.

## 2011-12-22 ENCOUNTER — Encounter: Payer: Self-pay | Admitting: Family Medicine

## 2011-12-22 ENCOUNTER — Ambulatory Visit (INDEPENDENT_AMBULATORY_CARE_PROVIDER_SITE_OTHER)
Admission: RE | Admit: 2011-12-22 | Discharge: 2011-12-22 | Disposition: A | Discharge status: Home / Self Care | Payer: Medicare HMO | Source: Ambulatory Visit | Attending: Family Medicine | Admitting: Family Medicine

## 2011-12-22 ENCOUNTER — Ambulatory Visit (INDEPENDENT_AMBULATORY_CARE_PROVIDER_SITE_OTHER): Payer: Medicare HMO | Admitting: Family Medicine

## 2011-12-22 VITALS — BP 150/76 | HR 80 | Temp 97.6°F | Wt 288.8 lb

## 2011-12-22 DIAGNOSIS — M25551 Pain in right hip: Secondary | ICD-10-CM

## 2011-12-22 DIAGNOSIS — M25559 Pain in unspecified hip: Secondary | ICD-10-CM

## 2011-12-22 MED ORDER — OXYCODONE-ACETAMINOPHEN 5-300 MG PO TABS: 1.0000 | ORAL_TABLET | Freq: Four times a day (QID) | ORAL | Status: DC | PRN

## 2011-12-22 MED ORDER — OXYCODONE-ACETAMINOPHEN 5-325 MG PO TABS: 1.0000 | ORAL_TABLET | Freq: Four times a day (QID) | ORAL | Status: DC | PRN

## 2011-12-22 NOTE — Patient Instructions (Addendum)
xrays showing significant amount of arthritis in both hips, so this is likely where pain is coming from. Take percocets for breakthrough pain.  If not improving next week, call us and I will order ultrasound of leg and may refer you to orthopedics.
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
## 2011-12-22 NOTE — Assessment & Plan Note (Addendum)
Hip xray today - arthritis present.  No fracture, AVN.  Read as: Narrowing of superior aspect of the right hip joint space with slight sclerosis and spurring. This is consistent with developing degenerative joint osteoarthritic changes.  Anticipate pain due to worsening OA of right hip.  already on ibuprofen 800mg  bid for back.  Tramadol did not help in past, vicodin not helping. Will do short course of percocets. Also at risk for DVT given obesity and recent prolonged car ride, however doubt this is case as no asymmetry noted on exam today. Doubt muscle strain/sprain as full strength with testing of major muscle groups today.  No evidence of infected joint If not better next week, consider dopplers to r/o DVT vs ortho referral, possible MRI to r/o early AVN (risk factor includes EtOH use).

## 2011-12-25 ENCOUNTER — Telehealth: Payer: Self-pay

## 2011-12-25 DIAGNOSIS — M79604 Pain in right leg: Secondary | ICD-10-CM

## 2011-12-25 NOTE — Telephone Encounter (Signed)
Pt said pain is better Oxycodone helped but pain never completely leaves; but rt hip still hurts pain level now is 5;pt has difficulty walking especially due to rt hip pain.Please advise. [facility].

## 2011-12-26 NOTE — Telephone Encounter (Signed)
plz notify I'd like to obtain ultrasound of right leg to rule out blood clot, if normal, will likely recommend referral to ortho for further evaluation of sudden onset right hip pain currently suspicious for progression of osteoarthritis.

## 2011-12-26 NOTE — Telephone Encounter (Signed)
Patient notified and will await call for [place] referral.

## 2011-12-28 ENCOUNTER — Encounter (INDEPENDENT_AMBULATORY_CARE_PROVIDER_SITE_OTHER): Payer: Medicare HMO

## 2011-12-28 DIAGNOSIS — M79609 Pain in unspecified limb: Secondary | ICD-10-CM

## 2011-12-28 DIAGNOSIS — M79604 Pain in right leg: Secondary | ICD-10-CM

## 2011-12-29 ENCOUNTER — Other Ambulatory Visit: Payer: Self-pay | Admitting: *Deleted

## 2011-12-29 MED ORDER — OXYCODONE-ACETAMINOPHEN 5-325 MG PO TABS: 1.0000 | ORAL_TABLET | Freq: Four times a day (QID) | ORAL | Status: DC | PRN

## 2011-12-29 NOTE — Telephone Encounter (Signed)
He had it filled a week ago so 40 pills should last him 10 days at Q6 hours  Will give enough to get through the weekend and then have Dr Reece Agar reassess

## 2011-12-29 NOTE — Telephone Encounter (Signed)
Patient notified and Rx dropped of at [facility] for patient pick up.

## 2011-12-29 NOTE — Telephone Encounter (Signed)
Patient aware that it will be too late to pick up Rx today, but will run out on Sunday. I advised I would take Rx to [facility] for him to pick up meds if you will write Rx.

## 2011-12-29 NOTE — Telephone Encounter (Signed)
OK to refill in Dr. G's absence?  

## 2012-01-02 ENCOUNTER — Telehealth: Payer: Self-pay

## 2012-01-02 DIAGNOSIS — M25551 Pain in right hip: Secondary | ICD-10-CM

## 2012-01-02 NOTE — Telephone Encounter (Signed)
Pt still having rt upper leg pain. Oxycodone helps pain but does not relieve pain. Pt request [place] results and plan of what to do for pts leg.Please advise.

## 2012-01-02 NOTE — Telephone Encounter (Signed)
Pt left v/m wants to discuss plans for next check up and problem with leg. Left v/m for pt to call back.

## 2012-01-02 NOTE — Telephone Encounter (Signed)
plz notify I have not received records of [place] results yet - when was this done?  Was ordered 12/26/2011.  If normal, will want to refer to ortho.

## 2012-01-03 NOTE — Telephone Encounter (Signed)
Tracked down results and Bill [name] placed on your desk.

## 2012-01-03 NOTE — Telephone Encounter (Signed)
Patient notified. He will await call from [facility] for referral. He has seen Dr. Lajoyce Corners and [facility] before, but wants someone that specializes in hip pain. He also needs a refill on meds.

## 2012-01-03 NOTE — Telephone Encounter (Signed)
Reviewed RLE duplex - no DVT. Will proceed with ortho referral.  Placed order in chart. plz notify pt of above.

## 2012-01-04 ENCOUNTER — Other Ambulatory Visit: Payer: Self-pay | Admitting: *Deleted

## 2012-01-04 MED ORDER — OXYCODONE-ACETAMINOPHEN 5-325 MG PO TABS: 1.0000 | ORAL_TABLET | Freq: Three times a day (TID) | ORAL | Status: AC | PRN

## 2012-01-04 NOTE — Telephone Encounter (Signed)
plz notify ready to pick up. 

## 2012-01-04 NOTE — Telephone Encounter (Signed)
Patient needs refill. Not seeing ortho until 01/10/12. Will run out tomorrow.

## 2012-01-04 NOTE — Telephone Encounter (Signed)
See refill note

## 2012-01-04 NOTE — Telephone Encounter (Signed)
Patient notified. Rx placed up front for pick up. 

## 2012-01-25 ENCOUNTER — Telehealth: Payer: Self-pay | Admitting: *Deleted

## 2012-01-25 NOTE — Telephone Encounter (Signed)
Form for diabetic testing supplies in your IN box 

## 2012-01-26 NOTE — Telephone Encounter (Signed)
Signed and placed in Kim's box. 

## 2012-01-26 NOTE — Telephone Encounter (Signed)
Form faxed

## 2012-02-19 ENCOUNTER — Other Ambulatory Visit (HOSPITAL_COMMUNITY): Payer: Self-pay | Admitting: Orthopaedic Surgery

## 2012-02-27 ENCOUNTER — Other Ambulatory Visit: Payer: Self-pay | Admitting: Family Medicine

## 2012-02-29 ENCOUNTER — Encounter (HOSPITAL_COMMUNITY): Payer: Self-pay | Admitting: Pharmacy Technician

## 2012-02-29 ENCOUNTER — Telehealth: Payer: Self-pay

## 2012-02-29 ENCOUNTER — Ambulatory Visit: Payer: Medicare HMO | Admitting: Family Medicine

## 2012-02-29 NOTE — Telephone Encounter (Signed)
Pt cancelled appt with Dr Reece Agar on 03/07/12; pt has pre op surgery scheduled for 03/07/12. Pt said appt was to f/u on low potassium. Pt rescheduled appt for 03/01/12 at 8:15 am.

## 2012-03-01 ENCOUNTER — Encounter: Payer: Self-pay | Admitting: Family Medicine

## 2012-03-01 ENCOUNTER — Ambulatory Visit (INDEPENDENT_AMBULATORY_CARE_PROVIDER_SITE_OTHER): Payer: Medicare HMO | Admitting: Family Medicine

## 2012-03-01 VITALS — BP 136/84 | HR 76 | Temp 97.4°F | Wt 285.2 lb

## 2012-03-01 DIAGNOSIS — Z23 Encounter for immunization: Secondary | ICD-10-CM

## 2012-03-01 DIAGNOSIS — E875 Hyperkalemia: Secondary | ICD-10-CM

## 2012-03-01 DIAGNOSIS — I10 Essential (primary) hypertension: Secondary | ICD-10-CM

## 2012-03-01 DIAGNOSIS — E119 Type 2 diabetes mellitus without complications: Secondary | ICD-10-CM

## 2012-03-01 DIAGNOSIS — N3941 Urge incontinence: Secondary | ICD-10-CM

## 2012-03-01 LAB — HEMOGLOBIN A1C: Hgb A1c MFr Bld: 6.6 % — ABNORMAL HIGH (ref 4.6–6.5)

## 2012-03-01 LAB — BASIC METABOLIC PANEL
BUN: 16 mg/dL (ref 6–23)
CO2: 25 mEq/L (ref 19–32)
Calcium: 9.8 mg/dL (ref 8.4–10.5)
GFR: 68.95 mL/min (ref >60.00)
Glucose, Bld: 133 mg/dL — ABNORMAL HIGH (ref 70–99)

## 2012-03-01 NOTE — Assessment & Plan Note (Signed)
Discussed increasing dose as notes trouble with urge after 4pm.  Discussed side effects to expect.  Prefers to stay at current dose.

## 2012-03-01 NOTE — Progress Notes (Signed)
Subjective:    Patient ID: Bill [name], male    DOB: Aug 29, 1940, 71 y.o.   MRN: [id number]  HPI CC: f/u potassium  Bill [name] is here for 3 mo f/u for chronically elevated potassium levels.  He is also overdue for diabetes check.  HTN - compliant with meds.  No HA, vision changes, CP/tightness, SOB.  + leg swelling occasionally.  Using maxzide on prn basis.  Potassium has been chronically slightly elevated.  Kidney function has been borderline stage 3 kidney disease.  At home BP runs 130/70s.  DM - compliant with meds.  On metformin 1000mg  bid and glucotrol 10mg  bid.  Fasting sugars average 100.  Vision exam was 2 wks ago.  Foot exam today.  He has pending right hip replacement later this month with Dr. Magnus Ivan. Last medicare wellness exam was 08/2010.  Past Medical History  Diagnosis Date  . Hypertension   . T2DM (type 2 diabetes mellitus) 1980s  . Exogenous obesity   . Urge incontinence   . Lyme disease 2012    ?titers negative  . History of chicken pox   . Kidney stones remote  . Mild aortic stenosis 12/2010   Current Outpatient Prescriptions on File Prior to Visit  Medication Sig Dispense Refill  . enalapril (VASOTEC) 10 MG tablet Take 10 mg by mouth every morning.      Marland Kitchen glipiZIDE (GLUCOTROL) 10 MG tablet Take 10 mg by mouth 2 (two) times daily before a meal.      . ibuprofen (ADVIL,MOTRIN) 800 MG tablet Take 800 mg by mouth 3 (three) times daily as needed. As needed for pain.      Demetra Shiner Devices (SOFT TOUCH LANCET DEVICE) MISC 100 strips by Does not apply route 2 (two) times daily.  3 each  3  . metFORMIN (GLUCOPHAGE) 1000 MG tablet Take 1 tablet (1,000 mg total) by mouth 2 (two) times daily with a meal.  180 tablet  3  . Multiple Vitamins-Minerals (MULTIVITAMIN PO) Take 5 tablets by mouth daily. Multivitamin packet containing 5 tablets.      Marland Kitchen oxybutynin (DITROPAN XL) 15 MG 24 hr tablet Take 1 tablet (15 mg total) by mouth daily.  90 tablet  3  . oxyCODONE-acetaminophen  (PERCOCET/ROXICET) 5-325 MG per tablet Take 1 tablet by mouth every 8 (eight) hours as needed. For pain      . triamterene-hydrochlorothiazide (MAXZIDE-25) 37.5-25 MG per tablet Take 1 tablet by mouth 2 (two) times daily.        Review of Systems Per HPI    Objective:   Physical Exam  Nursing note and vitals reviewed. Constitutional: He appears well-developed and well-nourished. No distress.  HENT:  Head: Normocephalic and atraumatic.  Right Ear: External ear normal.  Left Ear: External ear normal.  Nose: Nose normal.  Mouth/Throat: Oropharynx is clear and moist. No oropharyngeal exudate.  Eyes: Conjunctivae normal and EOM are normal. Pupils are equal, round, and reactive to light. No scleral icterus.  Neck: Normal range of motion. Neck supple.  Cardiovascular: Normal rate, regular rhythm and intact distal pulses.   Murmur (3/6 SEM, chronic) heard. Pulmonary/Chest: Effort normal and breath sounds normal. No respiratory distress. He has no wheezes. He has no rales.  Musculoskeletal: He exhibits no edema.       Diabetic foot exam: Normal inspection No skin breakdown No calluses  Normal DP/PT pulses Normal sensation to light tough and monofilament Nails normal  Lymphadenopathy:    He has no cervical adenopathy.  Skin: Skin  is warm and dry. No rash noted.  Psychiatric: He has a normal mood and affect.       Assessment & Plan:

## 2012-03-01 NOTE — Assessment & Plan Note (Signed)
Continue enalapril 10mg  daily. Consider changing maxzide to lasix if K remaining elevated.  Discussed this today.

## 2012-03-01 NOTE — Assessment & Plan Note (Signed)
Chronic, stable. Check A1c today.  Continue meds.

## 2012-03-01 NOTE — Assessment & Plan Note (Signed)
Chronic, stable. Continue enalapril 10mg  daily and maxzide prn. BP Readings from Last 3 Encounters:  03/01/12 136/84  12/22/11 150/76  11/29/11 140/76  did not tolerate amlodipine (raised bp)

## 2012-03-01 NOTE — Patient Instructions (Signed)
Flu shot today Blood work today. Good to see you, call us with quesitons. We will call you with plan if we want to make any changes to your blood pressure medicines.

## 2012-03-04 ENCOUNTER — Other Ambulatory Visit: Payer: Self-pay | Admitting: Family Medicine

## 2012-03-04 DIAGNOSIS — I10 Essential (primary) hypertension: Secondary | ICD-10-CM

## 2012-03-04 MED ORDER — FUROSEMIDE 20 MG PO TABS: 20.0000 mg | ORAL_TABLET | Freq: Every day | ORAL | Status: DC

## 2012-03-05 ENCOUNTER — Other Ambulatory Visit: Payer: Self-pay | Admitting: *Deleted

## 2012-03-05 MED ORDER — FUROSEMIDE 20 MG PO TABS: 20.0000 mg | ORAL_TABLET | Freq: Every day | ORAL | Status: DC

## 2012-03-06 NOTE — Patient Instructions (Addendum)
20 Bill [name]  03/06/2012   Your procedure is scheduled on:  03-12-2012  Report to [facility] at 1000  AM.  Call this number if you have problems the morning of surgery: [phone]   Remember:   Do not eat food or drink liquids:After Midnight.  .  Take these medicines the morning of surgery with A SIP OF WATER: percocet if needed   Do not wear jewelry or make up.  Do not wear lotions, powders, or perfumes. You may wear deodorant.    Do not bring valuables to the hospital.  Contacts, dentures or bridgework may not be worn into surgery.  Leave suitcase in the car. After surgery it may be brought to your room.  For patients admitted to the hospital, checkout time is 11:00 AM the day of discharge                             Patients discharged the day of surgery will not be allowed to drive home. If going home same day of surgery, you must have someone stay with you the first 24 hours at home and arrange for some one to drive you home from hospital.    Special Instructions: See [facility] Preparing for Surgery instruction sheet. Women do not shave legs or underarms for 12 hours before showers. Men may shave face morning of surgery.    Please read over the following fact sheets that you were given: MRSA Information, blood fact sheet  [facility] pre op nurse phone number [phone], call if needed

## 2012-03-07 ENCOUNTER — Other Ambulatory Visit: Payer: Self-pay

## 2012-03-07 ENCOUNTER — Encounter (HOSPITAL_COMMUNITY)
Admission: RE | Admit: 2012-03-07 | Discharge: 2012-03-07 | Disposition: A | Discharge status: Home / Self Care | Payer: Medicare HMO | Source: Ambulatory Visit | Attending: Orthopaedic Surgery | Admitting: Orthopaedic Surgery

## 2012-03-07 ENCOUNTER — Ambulatory Visit: Payer: Medicare HMO | Admitting: Family Medicine

## 2012-03-07 ENCOUNTER — Encounter (HOSPITAL_COMMUNITY): Payer: Self-pay

## 2012-03-07 HISTORY — DX: Gastro-esophageal reflux disease without esophagitis: K21.9

## 2012-03-07 LAB — CBC
HCT: 42.4 % (ref 39.0–52.0)
Hemoglobin: 14.2 g/dL (ref 13.0–17.0)
MCH: 32.3 pg (ref 26.0–34.0)
MCHC: 33.5 g/dL (ref 30.0–36.0)

## 2012-03-07 LAB — URINALYSIS, ROUTINE W REFLEX MICROSCOPIC
Bilirubin Urine: NEGATIVE
Glucose, UA: NEGATIVE mg/dL
Hgb urine dipstick: NEGATIVE
Ketones, ur: NEGATIVE mg/dL
Protein, ur: NEGATIVE mg/dL

## 2012-03-07 LAB — BASIC METABOLIC PANEL
BUN: 13 mg/dL (ref 6–23)
GFR calc non Af Amer: 71 mL/min — ABNORMAL LOW (ref >90)
Glucose, Bld: 158 mg/dL — ABNORMAL HIGH (ref 70–99)
Potassium: 5.1 mEq/L (ref 3.5–5.1)

## 2012-03-07 LAB — APTT: aPTT: 44 seconds — ABNORMAL HIGH (ref 24–37)

## 2012-03-07 LAB — PROTIME-INR: INR: 0.97 (ref 0.00–1.49)

## 2012-03-07 NOTE — Progress Notes (Signed)
03/07/12 0914  OBSTRUCTIVE SLEEP APNEA  Score 4 or greater  Results sent to PCP

## 2012-03-07 NOTE — Progress Notes (Signed)
Sherrie billing dr c blackman aware ekg 03-06-2012 atrial fib, no current  cardiologist.

## 2012-03-11 ENCOUNTER — Other Ambulatory Visit (INDEPENDENT_AMBULATORY_CARE_PROVIDER_SITE_OTHER): Payer: Medicare HMO

## 2012-03-11 ENCOUNTER — Ambulatory Visit (INDEPENDENT_AMBULATORY_CARE_PROVIDER_SITE_OTHER): Payer: Medicare HMO | Admitting: Physician Assistant

## 2012-03-11 ENCOUNTER — Encounter: Payer: Self-pay | Admitting: Physician Assistant

## 2012-03-11 VITALS — BP 144/83 | HR 79 | Wt 283.0 lb

## 2012-03-11 DIAGNOSIS — I4891 Unspecified atrial fibrillation: Secondary | ICD-10-CM

## 2012-03-11 DIAGNOSIS — I350 Nonrheumatic aortic (valve) stenosis: Secondary | ICD-10-CM

## 2012-03-11 DIAGNOSIS — Z01810 Encounter for preprocedural cardiovascular examination: Secondary | ICD-10-CM

## 2012-03-11 DIAGNOSIS — I10 Essential (primary) hypertension: Secondary | ICD-10-CM

## 2012-03-11 DIAGNOSIS — I359 Nonrheumatic aortic valve disorder, unspecified: Secondary | ICD-10-CM

## 2012-03-11 LAB — BASIC METABOLIC PANEL
Chloride: 101 mEq/L (ref 96–112)
Potassium: 5 mEq/L (ref 3.5–5.1)
Sodium: 137 mEq/L (ref 135–145)

## 2012-03-11 NOTE — Patient Instructions (Addendum)
Your physician has requested that you have an echocardiogram 03/29/12 @ 10:30 AM SAME DAY YOU SEE DR. Ladona Ridgel. Echocardiography is a painless test that uses sound waves to create images of your heart. It provides your doctor with information about the size and shape of your heart and how well your heart's chambers and valves are working. This procedure takes approximately one hour. There are no restrictions for this procedure.  Your physician recommends that you schedule a follow-up appointment in: 03/29/12 @ 12 PM WITH DR. Ladona Ridgel  NO CHANGES WERE MADE TODAY

## 2012-03-11 NOTE — Progress Notes (Signed)
Cardiac clearance note [facility] [phone] epic

## 2012-03-11 NOTE — Progress Notes (Signed)
[address] [place], [place]  [address] Phone: [phone], Fax:  [phone]  Date:  03/11/2012   Name:  Bill [name]   DOB:  11/24/1940   MRN:  [id number]  PCP:  Eustaquio Boyden, MD  Primary Cardiologist/Primary Electrophysiologist:  Dr. Lewayne Bunting    History of Present Illness: Bill [name] is a 71 y.o. male who returns for surgical clearance.  He has a hx of HTN, DM2, obesity and mild AS.  He saw Dr. Lewayne Bunting in 8/12 for possible AFib.  He was dx with Lyme disease around that time and was also noting dyspnea.  ECG demonstrated NSR.  Watchful waiting was recommended.  Echo demonstrated normal LV function and mild aortic stenosis. Patient now needs right total hip replacement with Dr. Magnus Ivan. Surgery is scheduled for tomorrow morning.  Patient denies history of chest pain or significant shortness of breath. He denies orthopnea, PND or edema. He denies syncope or palpitations. ECG today demonstrates atrial fibrillation with controlled ventricular rate. Patient states he was very active up until the last 2-3 months. This is when his hip pain worsened. Prior to that he was able to cut wood, mow, rake, etc without chest pain or significant dyspnea (he was able to exert > 4 METs).    Chest CTA 10/12:  No pulmo embolism, cardiomegaly, no edema Labs (3/13):    TSH 2.63 Labs (10/13):  K 5.1, creatinine 1.02, Hgb 14.2    Wt Readings from Last 3 Encounters:  03/11/12 283 lb (128.368 kg)  03/07/12 283 lb (128.368 kg)  03/01/12 285 lb 4 oz (129.389 kg)     Past Medical History  Diagnosis Date  . Hypertension   . T2DM (type 2 diabetes mellitus) 1980s  . Exogenous obesity   . Urge incontinence   . Lyme disease 2012    ?titers negative  . History of chicken pox   . Mild aortic stenosis 12/2010    Echo 12/29/10: Mild LVH, EF 50-55%, normal wall motion, mild aortic stenosis, trivial AI, mean gradient 19 mmHg, mild LAE  . Kidney stones remote  . GERD  (gastroesophageal reflux disease)     occasional  . Lyme disease feb 2012  . Atrial fibrillation     Current Outpatient Prescriptions  Medication Sig Dispense Refill  . enalapril (VASOTEC) 10 MG tablet Take 10 mg by mouth every morning.      . furosemide (LASIX) 20 MG tablet Take 1 tablet (20 mg total) by mouth daily.  30 tablet  6  . glipiZIDE (GLUCOTROL) 10 MG tablet Take 10 mg by mouth daily.       Marland Kitchen ibuprofen (ADVIL,MOTRIN) 800 MG tablet Take 800 mg by mouth 3 (three) times daily as needed. As needed for pain.      Demetra Shiner Devices (SOFT TOUCH LANCET DEVICE) MISC 100 strips by Does not apply route 2 (two) times daily.  3 each  3  . metFORMIN (GLUCOPHAGE) 1000 MG tablet Take 1 tablet (1,000 mg total) by mouth 2 (two) times daily with a meal.  180 tablet  3  . Multiple Vitamins-Minerals (MULTIVITAMIN PO) Take 5 tablets by mouth daily. Multivitamin packet containing 5 tablets.      Marland Kitchen oxybutynin (DITROPAN XL) 15 MG 24 hr tablet Take 1 tablet (15 mg total) by mouth daily.  90 tablet  3  . oxyCODONE-acetaminophen (PERCOCET/ROXICET) 5-325 MG per tablet Take 1 tablet by mouth every 8 (eight) hours as needed. For pain  Allergies: No Known Allergies  Social History:   reports that he has never smoked. He has never used smokeless tobacco. He reports that he drinks alcohol. He reports that he does not use illicit drugs.   ROS:  Please see the history of present illness.   All other systems reviewed and negative.   PHYSICAL EXAM: VS:  BP 144/83  Pulse 79  Wt 283 lb (128.368 kg) Well nourished, well developed, in no acute distress HEENT: normal Neck: no JVDAt 90 Cardiac:  normal S1, S2;  irregularly irregular rhythm, 2/6 systolic murmur heard best at the RUSB Lungs:  clear to auscultation bilaterally, no wheezing, rhonchi or rales Abd: soft, nontender  Ext: no edema Skin: warm and dry Neuro:  CNs 2-12 intact, no focal abnormalities noted  EKG:  Atrial fibrillation, HR 79,  low-voltage, left axis deviation, poor R wave progression, nonspecific ST-T wave changes      ASSESSMENT AND PLAN:  1. Surgical Clearance:   The patient has right total hip replacement pending in less than 24 hours. He has a CAD risk equivalent with diabetes. He does not have any documented CAD in the past. He has not had any stress testing. Echocardiogram 1 year ago demonstrated mild aortic stenosis. He has not had any symptoms to suggest unstable angina. His ECG demonstrates atrial fibrillation with controlled ventricular rate. He was quite active up until the last 2-3 months and was clearly able to achieve greater than 4 METs without chest pain or significant shortness of breath. I reviewed his case today with Dr. Patty Sermons (DOD). After careful review of his current functional status and past history, we decided that no further cardiac testing is warranted at this time. He should be at acceptable risk for his noncardiac procedure. Our service will be available in the perioperative period as necessary.  2. Atrial Fibrillation:   His ECG appears to demonstrate atrial fibrillation. His CHADS2 score is 2. He would benefit from long-term anticoagulation. His rate is controlled. As noted above, I reviewed his case today with Dr. Patty Sermons. We will hold off on initiating anticoagulation given his pending surgery. I will make sure he has close followup with Dr. Ladona Ridgel after his surgery in the next 2-3 weeks to initiate anticoagulation at that time.  3. Aortic Stenosis:   Mild aortic stenosis by echocardiogram in the past. I will make sure he has a followup echocardiogram prior to his visit with Dr. Ladona Ridgel.   4. Hypertension:   Blood pressure somewhat elevated today. He is in significant amounts of pain due to his hip. Continue to monitor.   Bill Glasgow, PA-C  9:46 AM 03/11/2012

## 2012-03-12 ENCOUNTER — Inpatient Hospital Stay (HOSPITAL_COMMUNITY): Payer: Medicare HMO

## 2012-03-12 ENCOUNTER — Encounter (HOSPITAL_COMMUNITY): Payer: Self-pay | Admitting: Anesthesiology

## 2012-03-12 ENCOUNTER — Encounter (HOSPITAL_COMMUNITY)
Admission: RE | Disposition: A | Discharge status: Home Health | Payer: Self-pay | Source: Ambulatory Visit | Attending: Orthopaedic Surgery

## 2012-03-12 ENCOUNTER — Inpatient Hospital Stay (HOSPITAL_COMMUNITY)
Admission: RE | Admit: 2012-03-12 | Discharge: 2012-03-16 | DRG: 470 | Disposition: A | Discharge status: Home Health | Payer: Medicare HMO | Source: Ambulatory Visit | Attending: Orthopaedic Surgery | Admitting: Orthopaedic Surgery

## 2012-03-12 ENCOUNTER — Encounter (HOSPITAL_COMMUNITY): Payer: Self-pay | Admitting: *Deleted

## 2012-03-12 ENCOUNTER — Inpatient Hospital Stay (HOSPITAL_COMMUNITY): Payer: Medicare HMO | Admitting: Anesthesiology

## 2012-03-12 DIAGNOSIS — M169 Osteoarthritis of hip, unspecified: Secondary | ICD-10-CM

## 2012-03-12 DIAGNOSIS — Z01812 Encounter for preprocedural laboratory examination: Secondary | ICD-10-CM

## 2012-03-12 DIAGNOSIS — I10 Essential (primary) hypertension: Secondary | ICD-10-CM | POA: Diagnosis present

## 2012-03-12 DIAGNOSIS — I4891 Unspecified atrial fibrillation: Secondary | ICD-10-CM | POA: Diagnosis present

## 2012-03-12 DIAGNOSIS — E669 Obesity, unspecified: Secondary | ICD-10-CM | POA: Diagnosis present

## 2012-03-12 DIAGNOSIS — E119 Type 2 diabetes mellitus without complications: Secondary | ICD-10-CM | POA: Diagnosis present

## 2012-03-12 DIAGNOSIS — K219 Gastro-esophageal reflux disease without esophagitis: Secondary | ICD-10-CM | POA: Diagnosis present

## 2012-03-12 DIAGNOSIS — M1610 Unilateral primary osteoarthritis, unspecified hip: Principal | ICD-10-CM | POA: Diagnosis present

## 2012-03-12 HISTORY — PX: TOTAL HIP ARTHROPLASTY: SHX124

## 2012-03-12 LAB — ABO/RH: ABO/RH(D): A POS

## 2012-03-12 LAB — GLUCOSE, CAPILLARY
Glucose-Capillary: 106 mg/dL — ABNORMAL HIGH (ref 70–99)
Glucose-Capillary: 92 mg/dL (ref 70–99)
Glucose-Capillary: 144 mg/dL — ABNORMAL HIGH (ref 70–99)

## 2012-03-12 LAB — TYPE AND SCREEN
ABO/RH(D): A POS
Antibody Screen: NEGATIVE

## 2012-03-12 SURGERY — ARTHROPLASTY, HIP, TOTAL, ANTERIOR APPROACH
Anesthesia: General | Site: Hip | Laterality: Right | Wound class: Clean

## 2012-03-12 MED ORDER — FERROUS SULFATE 325 (65 FE) MG PO TABS
325.0000 mg | ORAL_TABLET | Freq: Three times a day (TID) | ORAL | Status: DC
  Administered 2012-03-12 – 2012-03-16 (×11): 325 mg via ORAL
  Filled 2012-03-12 (×14): qty 1

## 2012-03-12 MED ORDER — KETOROLAC TROMETHAMINE 15 MG/ML IJ SOLN
7.5000 mg | Freq: Four times a day (QID) | INTRAMUSCULAR | Status: AC
  Administered 2012-03-12 – 2012-03-13 (×4): 7.5 mg via INTRAVENOUS
  Filled 2012-03-12 (×4): qty 1

## 2012-03-12 MED ORDER — ACETAMINOPHEN 325 MG PO TABS
650.0000 mg | ORAL_TABLET | Freq: Four times a day (QID) | ORAL | Status: DC | PRN
  Administered 2012-03-15: 650 mg via ORAL
  Filled 2012-03-12: qty 2

## 2012-03-12 MED ORDER — ONDANSETRON HCL 4 MG/2ML IJ SOLN
INTRAMUSCULAR | Status: DC | PRN
  Administered 2012-03-12: 4 mg via INTRAVENOUS

## 2012-03-12 MED ORDER — ENALAPRIL MALEATE 10 MG PO TABS
10.0000 mg | ORAL_TABLET | Freq: Every morning | ORAL | Status: DC
  Administered 2012-03-13 – 2012-03-16 (×4): 10 mg via ORAL
  Filled 2012-03-12 (×4): qty 1

## 2012-03-12 MED ORDER — DOCUSATE SODIUM 100 MG PO CAPS
100.0000 mg | ORAL_CAPSULE | Freq: Two times a day (BID) | ORAL | Status: DC
  Administered 2012-03-12 – 2012-03-16 (×8): 100 mg via ORAL

## 2012-03-12 MED ORDER — OXYBUTYNIN CHLORIDE ER 15 MG PO TB24
15.0000 mg | ORAL_TABLET | Freq: Every day | ORAL | Status: DC
  Administered 2012-03-12 – 2012-03-16 (×5): 15 mg via ORAL
  Filled 2012-03-12 (×5): qty 1

## 2012-03-12 MED ORDER — PHENOL 1.4 % MT LIQD: 1.0000 | OROMUCOSAL | Status: DC | PRN

## 2012-03-12 MED ORDER — OXYCODONE HCL ER 20 MG PO T12A
20.0000 mg | EXTENDED_RELEASE_TABLET | Freq: Two times a day (BID) | ORAL | Status: DC
  Administered 2012-03-12 – 2012-03-16 (×8): 20 mg via ORAL
  Filled 2012-03-12 (×8): qty 1

## 2012-03-12 MED ORDER — SODIUM CHLORIDE 0.9 % IV SOLN
INTRAVENOUS | Status: DC
  Administered 2012-03-12 – 2012-03-13 (×2): via INTRAVENOUS

## 2012-03-12 MED ORDER — METHOCARBAMOL 500 MG PO TABS
500.0000 mg | ORAL_TABLET | Freq: Four times a day (QID) | ORAL | Status: DC | PRN
  Administered 2012-03-13 – 2012-03-16 (×7): 500 mg via ORAL
  Filled 2012-03-12 (×7): qty 1

## 2012-03-12 MED ORDER — RIVAROXABAN 10 MG PO TABS
10.0000 mg | ORAL_TABLET | Freq: Every day | ORAL | Status: DC
  Administered 2012-03-13 – 2012-03-16 (×4): 10 mg via ORAL
  Filled 2012-03-12 (×5): qty 1

## 2012-03-12 MED ORDER — LIDOCAINE HCL (CARDIAC) 20 MG/ML IV SOLN
INTRAVENOUS | Status: DC | PRN
  Administered 2012-03-12: 100 mg via INTRAVENOUS

## 2012-03-12 MED ORDER — PHENYLEPHRINE HCL 10 MG/ML IJ SOLN
INTRAMUSCULAR | Status: DC | PRN
  Administered 2012-03-12: 40 ug via INTRAVENOUS

## 2012-03-12 MED ORDER — ROCURONIUM BROMIDE 100 MG/10ML IV SOLN
INTRAVENOUS | Status: DC | PRN
  Administered 2012-03-12: 50 mg via INTRAVENOUS
  Administered 2012-03-12: 10 mg via INTRAVENOUS
  Administered 2012-03-12: 20 mg via INTRAVENOUS

## 2012-03-12 MED ORDER — METFORMIN HCL 500 MG PO TABS
1000.0000 mg | ORAL_TABLET | Freq: Two times a day (BID) | ORAL | Status: DC
  Administered 2012-03-12 – 2012-03-16 (×8): 1000 mg via ORAL
  Filled 2012-03-12 (×10): qty 2

## 2012-03-12 MED ORDER — LACTATED RINGERS IV SOLN
INTRAVENOUS | Status: DC
  Administered 2012-03-12: 1000 mL via INTRAVENOUS

## 2012-03-12 MED ORDER — LACTATED RINGERS IV SOLN
INTRAVENOUS | Status: DC | PRN
  Administered 2012-03-12 (×3): via INTRAVENOUS

## 2012-03-12 MED ORDER — INSULIN ASPART 100 UNIT/ML ~~LOC~~ SOLN
0.0000 [IU] | Freq: Three times a day (TID) | SUBCUTANEOUS | Status: DC
  Administered 2012-03-12: 2 [IU] via SUBCUTANEOUS
  Administered 2012-03-13: 3 [IU] via SUBCUTANEOUS
  Administered 2012-03-13 – 2012-03-16 (×7): 2 [IU] via SUBCUTANEOUS

## 2012-03-12 MED ORDER — ZOLPIDEM TARTRATE 5 MG PO TABS: 5.0000 mg | ORAL_TABLET | Freq: Every evening | ORAL | Status: DC | PRN

## 2012-03-12 MED ORDER — GLIPIZIDE 10 MG PO TABS
10.0000 mg | ORAL_TABLET | Freq: Every day | ORAL | Status: DC
  Administered 2012-03-12 – 2012-03-16 (×5): 10 mg via ORAL
  Filled 2012-03-12 (×5): qty 1

## 2012-03-12 MED ORDER — MIDAZOLAM HCL 5 MG/5ML IJ SOLN
INTRAMUSCULAR | Status: DC | PRN
  Administered 2012-03-12: 2 mg via INTRAVENOUS

## 2012-03-12 MED ORDER — FENTANYL CITRATE 0.05 MG/ML IJ SOLN
INTRAMUSCULAR | Status: DC | PRN
  Administered 2012-03-12: 150 ug via INTRAVENOUS
  Administered 2012-03-12: 100 ug via INTRAVENOUS

## 2012-03-12 MED ORDER — CEFAZOLIN SODIUM-DEXTROSE 2-3 GM-% IV SOLR
2.0000 g | Freq: Four times a day (QID) | INTRAVENOUS | Status: AC
  Administered 2012-03-12 – 2012-03-13 (×2): 2 g via INTRAVENOUS
  Filled 2012-03-12 (×2): qty 50

## 2012-03-12 MED ORDER — PROPOFOL 10 MG/ML IV BOLUS
INTRAVENOUS | Status: DC | PRN
  Administered 2012-03-12: 150 mg via INTRAVENOUS

## 2012-03-12 MED ORDER — ALUM & MAG HYDROXIDE-SIMETH 200-200-20 MG/5ML PO SUSP: 30.0000 mL | ORAL | Status: DC | PRN

## 2012-03-12 MED ORDER — PROMETHAZINE HCL 25 MG/ML IJ SOLN: 6.2500 mg | INTRAMUSCULAR | Status: DC | PRN

## 2012-03-12 MED ORDER — DIPHENHYDRAMINE HCL 12.5 MG/5ML PO ELIX
12.5000 mg | ORAL_SOLUTION | ORAL | Status: DC | PRN
Start: 2012-03-12 — End: 2012-03-16

## 2012-03-12 MED ORDER — DEXTROSE 5 % IV SOLN
500.0000 mg | Freq: Four times a day (QID) | INTRAVENOUS | Status: DC | PRN
  Administered 2012-03-12 – 2012-03-13 (×2): 500 mg via INTRAVENOUS
  Filled 2012-03-12 (×3): qty 5

## 2012-03-12 MED ORDER — ONDANSETRON HCL 4 MG/2ML IJ SOLN: 4.0000 mg | Freq: Four times a day (QID) | INTRAMUSCULAR | Status: DC | PRN

## 2012-03-12 MED ORDER — OXYCODONE HCL 5 MG PO TABS
5.0000 mg | ORAL_TABLET | ORAL | Status: DC | PRN
  Administered 2012-03-13: 10 mg via ORAL
  Administered 2012-03-13: 5 mg via ORAL
  Administered 2012-03-13 – 2012-03-16 (×6): 10 mg via ORAL
  Filled 2012-03-12: qty 2
  Filled 2012-03-12: qty 1
  Filled 2012-03-12 (×6): qty 2

## 2012-03-12 MED ORDER — HYDROMORPHONE HCL PF 1 MG/ML IJ SOLN
INTRAMUSCULAR | Status: AC
  Filled 2012-03-12: qty 1

## 2012-03-12 MED ORDER — FUROSEMIDE 20 MG PO TABS
20.0000 mg | ORAL_TABLET | Freq: Every day | ORAL | Status: DC
  Administered 2012-03-12 – 2012-03-16 (×5): 20 mg via ORAL
  Filled 2012-03-12 (×5): qty 1

## 2012-03-12 MED ORDER — HYDROMORPHONE HCL PF 1 MG/ML IJ SOLN
0.2500 mg | INTRAMUSCULAR | Status: DC | PRN
  Administered 2012-03-12 (×3): 0.5 mg via INTRAVENOUS

## 2012-03-12 MED ORDER — 0.9 % SODIUM CHLORIDE (POUR BTL) OPTIME
TOPICAL | Status: DC | PRN
  Administered 2012-03-12: 1000 mL

## 2012-03-12 MED ORDER — GLYCOPYRROLATE 0.2 MG/ML IJ SOLN
INTRAMUSCULAR | Status: DC | PRN
  Administered 2012-03-12: 0.6 mg via INTRAVENOUS

## 2012-03-12 MED ORDER — ACETAMINOPHEN 650 MG RE SUPP: 650.0000 mg | Freq: Four times a day (QID) | RECTAL | Status: DC | PRN

## 2012-03-12 MED ORDER — HYDROMORPHONE HCL PF 1 MG/ML IJ SOLN
1.0000 mg | INTRAMUSCULAR | Status: DC | PRN
  Administered 2012-03-12: 1 mg via INTRAVENOUS
  Filled 2012-03-12: qty 1

## 2012-03-12 MED ORDER — METOCLOPRAMIDE HCL 5 MG/ML IJ SOLN
5.0000 mg | Freq: Three times a day (TID) | INTRAMUSCULAR | Status: DC | PRN
  Filled 2012-03-12: qty 2

## 2012-03-12 MED ORDER — NEOSTIGMINE METHYLSULFATE 1 MG/ML IJ SOLN
INTRAMUSCULAR | Status: DC | PRN
  Administered 2012-03-12: 4 mg via INTRAVENOUS

## 2012-03-12 MED ORDER — METOCLOPRAMIDE HCL 10 MG PO TABS: 5.0000 mg | ORAL_TABLET | Freq: Three times a day (TID) | ORAL | Status: DC | PRN

## 2012-03-12 MED ORDER — MENTHOL 3 MG MT LOZG
1.0000 | LOZENGE | OROMUCOSAL | Status: DC | PRN
  Administered 2012-03-15: 3 mg via ORAL
  Filled 2012-03-12: qty 9

## 2012-03-12 MED ORDER — HYDROMORPHONE HCL PF 1 MG/ML IJ SOLN
INTRAMUSCULAR | Status: DC | PRN
  Administered 2012-03-12 (×2): .4 mg via INTRAVENOUS
  Administered 2012-03-12: .2 mg via INTRAVENOUS

## 2012-03-12 MED ORDER — DEXTROSE 5 % IV SOLN
3.0000 g | INTRAVENOUS | Status: AC
  Administered 2012-03-12: 3 g via INTRAVENOUS
  Filled 2012-03-12: qty 3000

## 2012-03-12 MED ORDER — ONDANSETRON HCL 4 MG PO TABS: 4.0000 mg | ORAL_TABLET | Freq: Four times a day (QID) | ORAL | Status: DC | PRN

## 2012-03-12 MED ORDER — ACETAMINOPHEN 10 MG/ML IV SOLN
INTRAVENOUS | Status: DC | PRN
  Administered 2012-03-12: 1000 mg via INTRAVENOUS

## 2012-03-12 SURGICAL SUPPLY — 36 items
BAG SPEC THK2 15X12 ZIP CLS (MISCELLANEOUS) ×2
BAG ZIPLOCK 12X15 (MISCELLANEOUS) ×4 IMPLANT
BLADE SAW SGTL 18X1.27X75 (BLADE) ×2 IMPLANT
CLOTH BEACON ORANGE TIMEOUT ST (SAFETY) ×2 IMPLANT
DRAPE C-ARM 42X72 X-RAY (DRAPES) ×2 IMPLANT
DRAPE STERI IOBAN 125X83 (DRAPES) ×2 IMPLANT
DRAPE U-SHAPE 47X51 STRL (DRAPES) ×6 IMPLANT
DRSG MEPILEX BORDER 4X8 (GAUZE/BANDAGES/DRESSINGS) ×2 IMPLANT
DURAPREP 26ML APPLICATOR (WOUND CARE) ×2 IMPLANT
ELECT BLADE TIP CTD 4 INCH (ELECTRODE) ×2 IMPLANT
ELECT REM PT RETURN 9FT ADLT (ELECTROSURGICAL) ×2
ELECTRODE REM PT RTRN 9FT ADLT (ELECTROSURGICAL) ×1 IMPLANT
ELIMINATOR HOLE APEX DEPUY (Hips) IMPLANT
FACESHIELD LNG OPTICON STERILE (SAFETY) ×8 IMPLANT
GAUZE XEROFORM 1X8 LF (GAUZE/BANDAGES/DRESSINGS) ×2 IMPLANT
GLOVE BIO SURGEON STRL SZ7 (GLOVE) ×2 IMPLANT
GLOVE BIO SURGEON STRL SZ7.5 (GLOVE) ×2 IMPLANT
GLOVE BIOGEL PI IND STRL 7.5 (GLOVE) IMPLANT
GLOVE BIOGEL PI IND STRL 8 (GLOVE) ×1 IMPLANT
GLOVE BIOGEL PI INDICATOR 7.5 (GLOVE)
GLOVE BIOGEL PI INDICATOR 8 (GLOVE) ×1
GLOVE ECLIPSE 7.0 STRL STRAW (GLOVE) ×2 IMPLANT
GOWN STRL REIN XL XLG (GOWN DISPOSABLE) ×4 IMPLANT
KIT BASIN OR (CUSTOM PROCEDURE TRAY) ×2 IMPLANT
PACK TOTAL JOINT (CUSTOM PROCEDURE TRAY) ×2 IMPLANT
PADDING CAST COTTON 6X4 STRL (CAST SUPPLIES) ×2 IMPLANT
STAPLER VISISTAT 35W (STAPLE) IMPLANT
SUT ETHIBOND NAB CT1 #1 30IN (SUTURE) ×3 IMPLANT
SUT VIC AB 1 CT1 36 (SUTURE) ×2 IMPLANT
SUT VIC AB 2-0 CT1 27 (SUTURE) ×4
SUT VIC AB 2-0 CT1 TAPERPNT 27 (SUTURE) ×2 IMPLANT
SUT VLOC 180 0 24IN GS25 (SUTURE) ×1 IMPLANT
TOWEL OR 17X26 10 PK STRL BLUE (TOWEL DISPOSABLE) ×4 IMPLANT
TOWEL OR NON WOVEN STRL DISP B (DISPOSABLE) ×2 IMPLANT
TRAY FOLEY CATH 14FRSI W/METER (CATHETERS) ×2 IMPLANT
WATER STERILE IRR 1500ML POUR (IV SOLUTION) ×2 IMPLANT

## 2012-03-12 NOTE — Progress Notes (Signed)
Utilization review completed.  

## 2012-03-12 NOTE — Preoperative (Signed)
Beta Blockers   Reason not to administer Beta Blockers:Not Applicable 

## 2012-03-12 NOTE — Anesthesia Preprocedure Evaluation (Addendum)
Anesthesia Evaluation  Patient identified by MRN, date of birth, ID band Patient awake    Reviewed: Allergy & Precautions, H&P , NPO status , Patient's Chart, lab work & pertinent test results  Airway Mallampati: II TM Distance: >3 FB Neck ROM: Full    Dental No notable dental hx.    Pulmonary neg pulmonary ROS,  breath sounds clear to auscultation  Pulmonary exam normal       Cardiovascular Exercise Tolerance: Good hypertension, Pt. on medications + dysrhythmias Atrial Fibrillation Rhythm:Regular Rate:Normal  Cardiac clearance in epic   Neuro/Psych negative neurological ROS  negative psych ROS   GI/Hepatic Neg liver ROS, GERD-  ,  Endo/Other  diabetes, Type 2, Oral Hypoglycemic Agents  Renal/GU Renal disease  negative genitourinary   Musculoskeletal negative musculoskeletal ROS (+)   Abdominal (+) + obese,   Peds negative pediatric ROS (+)  Hematology negative hematology ROS (+)   Anesthesia Other Findings   Reproductive/Obstetrics negative OB ROS                           Anesthesia Physical Anesthesia Plan  ASA: III  Anesthesia Plan: General   Post-op Pain Management:    Induction: Intravenous  Airway Management Planned: Oral ETT  Additional Equipment:   Intra-op Plan:   Post-operative Plan: Extubation in OR  Informed Consent: I have reviewed the patients History and Physical, chart, labs and discussed the procedure including the risks, benefits and alternatives for the proposed anesthesia with the patient or authorized representative who has indicated his/her understanding and acceptance.   Dental advisory given  Plan Discussed with: CRNA  Anesthesia Plan Comments: (APTT 44. On no anticoagulants. Plan general. Patient consents.)     Anesthesia Quick Evaluation

## 2012-03-12 NOTE — Brief Op Note (Signed)
03/12/2012  2:53 PM  PATIENT:  Bill [name]  71 y.o. male  PRE-OPERATIVE DIAGNOSIS:  Severe osteoarthritis right hip  POST-OPERATIVE DIAGNOSIS:  Severe osteoarthritis right hip  PROCEDURE:  Procedure(s) (LRB) with comments: TOTAL HIP ARTHROPLASTY ANTERIOR APPROACH (Right) - Right Total Hip Arthroplasty, Anterior Approach (C-Arm)  SURGEON:  Surgeon(s) and Role:    * [name], MD - Primary  PHYSICIAN ASSISTANT:   ASSISTANTS: none   ANESTHESIA:   general  EBL:  Total I/O In: 2000 [I.V.:2000] Out: 600 [Blood:600]  BLOOD ADMINISTERED:none  DRAINS: none   LOCAL MEDICATIONS USED:  NONE  SPECIMEN:  No Specimen  DISPOSITION OF SPECIMEN:  N/A  COUNTS:  YES  TOURNIQUET:  * No tourniquets in log *  DICTATION: .Other Dictation: Dictation Number [id number]  PLAN OF CARE: Admit to inpatient   PATIENT DISPOSITION:  PACU - hemodynamically stable.   Delay start of Pharmacological VTE agent (>24hrs) due to surgical blood loss or risk of bleeding: no

## 2012-03-12 NOTE — Transfer of Care (Signed)
Immediate Anesthesia Transfer of Care Note  Patient: Bill [name]  Procedure(s) Performed: Procedure(s) (LRB) with comments: TOTAL HIP ARTHROPLASTY ANTERIOR APPROACH (Right) - Right Total Hip Arthroplasty, Anterior Approach (C-Arm)  Patient Location: PACU  Anesthesia Type: General  Level of Consciousness: awake, alert , oriented and patient cooperative  Airway & Oxygen Therapy: Patient Spontanous Breathing and Patient connected to face mask oxygen  Post-op Assessment: Report given to PACU RN and Post -op Vital signs reviewed and stable  Post vital signs: Reviewed and stable  Complications: No apparent anesthesia complications

## 2012-03-12 NOTE — H&P (Signed)
TOTAL HIP ADMISSION H&P  Patient is admitted for right total hip arthroplasty.  Subjective:  Chief Complaint: right hip pain  HPI: Bill [name], 71 y.o. male, has a history of pain and functional disability in the right hip(s) due to arthritis and patient has failed non-surgical conservative treatments for greater than 12 weeks to include NSAID's and/or analgesics, flexibility and strengthening excercises, use of assistive devices, weight reduction as appropriate and activity modification.  Onset of symptoms was gradual starting 5 years ago with gradually worsening course since that time.The patient noted no past surgery on the right hip(s).  Patient currently rates pain in the right hip at 7 out of 10 with activity. Patient has night pain, worsening of pain with activity and weight bearing, pain that interfers with activities of daily living and pain with passive range of motion. Patient has evidence of subchondral cysts, subchondral sclerosis, periarticular osteophytes and joint space narrowing by imaging studies. This condition presents safety issues increasing the risk of falls.  There is no current active infection.  Patient Active Problem List   Diagnosis Date Noted  . Degenerative arthritis of hip 03/12/2012  . Atrial fibrillation 03/11/2012  . Right hip pain 12/22/2011  . Medicare annual wellness visit, initial 08/23/2011  . Hyperkalemia 08/23/2011  . Chronic lower back pain 08/23/2011  . Urge incontinence   . Aortic stenosis 01/05/2011  . Abnormal EKG 01/03/2011  . VITAMIN D DEFICIENCY 06/03/2008  . Obesity, unspecified 06/03/2008  . EDEMA 06/03/2008  . FALSE POSITIVE SEROLOGICAL TEST FOR SYPHILIS 06/03/2008  . BENIGN PROSTATIC HYPERTROPHY, HX OF 06/03/2008  . DIABETES MELLITUS, TYPE II 04/23/2007  . HYPERLIPIDEMIA 04/23/2007  . HYPERTENSION 12/03/2006   Past Medical History  Diagnosis Date  . Hypertension   . T2DM (type 2 diabetes mellitus) 1980s  . Exogenous obesity   .  Urge incontinence   . Lyme disease 2012    ?titers negative  . History of chicken pox   . Mild aortic stenosis 12/2010    Echo 12/29/10: Mild LVH, EF 50-55%, normal wall motion, mild aortic stenosis, trivial AI, mean gradient 19 mmHg, mild LAE  . Kidney stones remote  . GERD (gastroesophageal reflux disease)     occasional  . Atrial fibrillation     Past Surgical History  Procedure Date  . Kidney stone removal   . Finger sx   . US echocardiography 12/2010    EF 50-55%, mild LVH, normal wall motion, high LV filling pressures, mild AS, LA mildly dilated  . Back surgery 20 yrs ago    removal of lower back cartilage due to damage. Patient does not remember date of surgery.    Prescriptions prior to admission  Medication Sig Dispense Refill  . enalapril (VASOTEC) 10 MG tablet Take 10 mg by mouth every morning.      . furosemide (LASIX) 20 MG tablet Take 1 tablet (20 mg total) by mouth daily.  30 tablet  6  . glipiZIDE (GLUCOTROL) 10 MG tablet Take 10 mg by mouth daily.       Marland Kitchen ibuprofen (ADVIL,MOTRIN) 800 MG tablet Take 800 mg by mouth 3 (three) times daily as needed. As needed for pain.      . metFORMIN (GLUCOPHAGE) 1000 MG tablet Take 1 tablet (1,000 mg total) by mouth 2 (two) times daily with a meal.  180 tablet  3  . Multiple Vitamins-Minerals (MULTIVITAMIN PO) Take 5 tablets by mouth daily. Multivitamin packet containing 5 tablets.      Marland Kitchen oxybutynin (  DITROPAN XL) 15 MG 24 hr tablet Take 1 tablet (15 mg total) by mouth daily.  90 tablet  3  . oxyCODONE-acetaminophen (PERCOCET/ROXICET) 5-325 MG per tablet Take 1 tablet by mouth every 8 (eight) hours as needed. For pain      . Lancet Devices (SOFT TOUCH LANCET DEVICE) MISC 100 strips by Does not apply route 2 (two) times daily.  3 each  3   No Known Allergies  History  Substance Use Topics  . Smoking status: Never Smoker   . Smokeless tobacco: Never Used  . Alcohol Use: Yes     Regular (bourbon and coke 3-4/day)    Family History    Problem Relation Age of Onset  . Diabetes Father   . Diabetes Mother   . Heart disease Father   . Heart disease Mother   . Cancer Brother     throat     Review of Systems  Musculoskeletal: Positive for joint pain.  All other systems reviewed and are negative.    Objective:  Physical Exam  Constitutional: He is oriented to person, place, and time. He appears well-developed and well-nourished.  HENT:  Head: Normocephalic and atraumatic.  Eyes: Conjunctivae normal are normal. Pupils are equal, round, and reactive to light.  Neck: Normal range of motion. Neck supple.  Cardiovascular: Normal rate and regular rhythm.   Respiratory: Effort normal and breath sounds normal.  GI: Soft. Bowel sounds are normal.  Musculoskeletal:       Right hip: He exhibits decreased range of motion, tenderness and crepitus.  Neurological: He is alert and oriented to person, place, and time.  Skin: Skin is warm and dry.  Psychiatric: He has a normal mood and affect.    Vital signs in last 24 hours: Temp:  [97.7 F (36.5 C)] 97.7 F (36.5 C) (10/22 1005) Pulse Rate:  [79] 79  (10/22 1005) Resp:  [20] 20  (10/22 1005) BP: (169)/(84) 169/84 mmHg (10/22 1005) SpO2:  [99 %] 99 % (10/22 1005)  Labs:   Estimated Body mass index is 37.07 kg/(m^2) as calculated from the following:   Height as of 11/29/11: 6\' 2" (1.88 m).   Weight as of 12/22/11: 288 lb 12 oz(130.976 kg).   Imaging Review Plain radiographs demonstrate moderate degenerative joint disease of the right hip(s). The bone quality appears to be good for age and reported activity level.  Assessment/Plan:  End stage arthritis, right hip(s)  The patient history, physical examination, clinical judgement of the provider and imaging studies are consistent with end stage degenerative joint disease of the right hip(s) and total hip arthroplasty is deemed medically necessary. The treatment options including medical management, injection therapy,  arthroscopy and arthroplasty were discussed at length. The risks and benefits of total hip arthroplasty were presented and reviewed. The risks due to aseptic loosening, infection, stiffness, dislocation/subluxation,  thromboembolic complications and other imponderables were discussed.  The patient acknowledged the explanation, agreed to proceed with the plan and consent was signed. Patient is being admitted for inpatient treatment for surgery, pain control, PT, OT, prophylactic antibiotics, VTE prophylaxis, progressive ambulation and ADL's and discharge planning.The patient is planning to be discharged home with home health services

## 2012-03-13 ENCOUNTER — Encounter (HOSPITAL_COMMUNITY): Payer: Self-pay | Admitting: Orthopaedic Surgery

## 2012-03-13 LAB — GLUCOSE, CAPILLARY
Glucose-Capillary: 155 mg/dL — ABNORMAL HIGH (ref 70–99)
Glucose-Capillary: 131 mg/dL — ABNORMAL HIGH (ref 70–99)

## 2012-03-13 LAB — BASIC METABOLIC PANEL
Calcium: 8.2 mg/dL — ABNORMAL LOW (ref 8.4–10.5)
Chloride: 98 mEq/L (ref 96–112)
Creatinine, Ser: 1.15 mg/dL (ref 0.50–1.35)
GFR calc Af Amer: 71 mL/min — ABNORMAL LOW (ref >90)

## 2012-03-13 LAB — CBC
MCV: 96.9 fL (ref 78.0–100.0)
Platelets: 185 10*3/uL (ref 150–400)
RDW: 12.6 % (ref 11.5–15.5)
WBC: 6.5 10*3/uL (ref 4.0–10.5)

## 2012-03-13 NOTE — Evaluation (Signed)
Physical Therapy Evaluation Patient Details Name: Bill [name] MRN: [id number] DOB: 04/07/1941 Today's Date: 03/13/2012 Time: 1000-1030 PT Time Calculation (min): 30 min  PT Assessment / Plan / Recommendation Clinical Impression  Pt s/p R THR presents with decreased R LE strength/ROM limiting functional mobility    PT Assessment  Patient needs continued PT services    Follow Up Recommendations  Home health PT    Does the patient have the potential to tolerate intense rehabilitation      Barriers to Discharge None      Equipment Recommendations  Rolling walker with 5" wheels    Recommendations for Other Services OT consult   Frequency 7X/week    Precautions / Restrictions Precautions Precautions: Fall Restrictions Weight Bearing Restrictions: No Other Position/Activity Restrictions: WBAT   Pertinent Vitals/Pain 5-6/10 premedicated, ice pack provided      Mobility  Bed Mobility Bed Mobility: Supine to Sit Supine to Sit: 4: Min assist;3: Mod assist Details for Bed Mobility Assistance: cues for use of UEs and L LE to self assist Transfers Transfers: Sit to Stand;Stand to Sit Sit to Stand: 1: +2 Total assist Sit to Stand: Patient Percentage: 60% Stand to Sit: 1: +2 Total assist Stand to Sit: Patient Percentage: 70% Details for Transfer Assistance: cues for use of UEs and for LE placement Ambulation/Gait Ambulation/Gait Assistance: 3: Mod assist;4: Min assist Ambulation Distance (Feet): 38 Feet Assistive device: Rolling walker Ambulation/Gait Assistance Details: cues for sequence, posture, position from RW and IR on R Gait Pattern: Step-to pattern    Shoulder Instructions     Exercises Total Joint Exercises Ankle Circles/Pumps: AROM;10 reps;Supine;Both Quad Sets: AROM;10 reps;Supine;Both Heel Slides: AAROM;10 reps;Supine;Right Hip ABduction/ADduction: AAROM;Right;10 reps;Supine   PT Diagnosis: Difficulty walking  PT Problem List: Decreased  strength;Decreased range of motion;Decreased activity tolerance;Decreased mobility;Pain;Obesity;Decreased knowledge of use of DME PT Treatment Interventions: DME instruction;Gait training;Stair training;Functional mobility training;Therapeutic activities;Therapeutic exercise;Patient/family education   PT Goals Acute Rehab PT Goals PT Goal Formulation: With patient Time For Goal Achievement: 03/18/12 Potential to Achieve Goals: Good Pt will go Supine/Side to Sit: with supervision PT Goal: Supine/Side to Sit - Progress: Goal set today Pt will go Sit to Supine/Side: with supervision PT Goal: Sit to Supine/Side - Progress: Goal set today Pt will go Sit to Stand: with supervision PT Goal: Sit to Stand - Progress: Goal set today Pt will go Stand to Sit: with supervision PT Goal: Stand to Sit - Progress: Goal set today Pt will Ambulate: 51 - 150 feet;with supervision;with rolling walker PT Goal: Ambulate - Progress: Goal set today Pt will Go Up / Down Stairs: 3-5 stairs;with min assist;with least restrictive assistive device PT Goal: Up/Down Stairs - Progress: Goal set today  Visit Information  Last PT Received On: 03/13/12 Assistance Needed: +2    Subjective Data  Subjective: The Dr said I had worn grooves in the bone Patient Stated Goal: Resume previous lifestyle with decreased pain   Prior Functioning  Home Living Lives With: Spouse Available Help at Discharge: Family Type of Home: House Home Access: Stairs to enter Secretary/administrator of Steps: 4 Entrance Stairs-Rails: Can reach both;Left;Right Home Layout: One level Home Adaptive Equipment: Straight cane Prior Function Level of Independence: Independent with assistive device(s) Able to Take Stairs?: Yes Driving: Yes Communication Communication: No difficulties    Cognition  Overall Cognitive Status: Appears within functional limits for tasks assessed/performed Arousal/Alertness: Awake/alert Orientation Level: Appears  intact for tasks assessed Behavior During Session: [facility] for tasks performed    Extremity/Trunk  Assessment Right Upper Extremity Assessment RUE ROM/Strength/Tone: WFL for tasks assessed Left Upper Extremity Assessment LUE ROM/Strength/Tone: WFL for tasks assessed Right Lower Extremity Assessment RLE ROM/Strength/Tone: Deficits RLE ROM/Strength/Tone Deficits: hip strength 3-/5 with AAROM to 90 hip flex and 20 abd Left Lower Extremity Assessment LLE ROM/Strength/Tone: WFL for tasks assessed   Balance    End of Session PT - End of Session Equipment Utilized During Treatment: Gait belt Activity Tolerance: Patient tolerated treatment well Patient left: in chair;with call bell/phone within reach Nurse Communication: Mobility status  GP     Bill [name] 03/13/2012, 10:48 AM

## 2012-03-13 NOTE — Progress Notes (Signed)
Subjective: 1 Day Post-Op Procedure(s) (LRB): TOTAL HIP ARTHROPLASTY ANTERIOR APPROACH (Right) Patient reports pain as moderate.  Asymptomatic acute blood loss anemia and asymptomatic hyponatremia.  Objective: Vital signs in last 24 hours: Temp:  [97.3 F (36.3 C)-98.9 F (37.2 C)] 98.9 F (37.2 C) (10/23 0457) Pulse Rate:  [74-89] 74  (10/23 0457) Resp:  [13-20] 16  (10/23 0457) BP: (111-185)/(56-99) 130/74 mmHg (10/23 0457) SpO2:  [96 %-100 %] 99 % (10/23 0457) Weight:  [128.368 kg (283 lb)] 128.368 kg (283 lb) (10/22 1730)  Intake/Output from previous day: 10/22 0701 - 10/23 0700 In: 4517.5 [P.O.:360; I.V.:4157.5] Out: 1575 [Urine:975; Blood:600] Intake/Output this shift: Total I/O In: 1267.5 [P.O.:360; I.V.:907.5] Out: 700 [Urine:700]   Basename 03/13/12 0440  HGB 10.4*    Basename 03/13/12 0440  WBC 6.5  RBC 3.20*  HCT 31.0*  PLT 185    Basename 03/13/12 0440 03/11/12 1045  NA 131* 137  K 4.9 5.0  CL 98 101  CO2 25 27  BUN 23 15  CREATININE 1.15 1.1  GLUCOSE 184* 102*  CALCIUM 8.2* 9.7   No results found for this basename: LABPT:2,INR:2 in the last 72 hours  Sensation intact distally Intact pulses distally Dorsiflexion/Plantar flexion intact Incision: scant drainage  Assessment/Plan: 1 Day Post-Op Procedure(s) (LRB): TOTAL HIP ARTHROPLASTY ANTERIOR APPROACH (Right) Up with therapy, WBAT right hip.  Bill [name] 03/13/2012, 6:58 AM

## 2012-03-13 NOTE — Op Note (Signed)
NAMEGRAYLAND, DAISEY NO.:  [id number]  MEDICAL RECORD NO.:  [id number]  LOCATION:  1620                         FACILITY:  [facility]  PHYSICIAN:  Vanita Panda. Magnus Ivan, M.D.DATE OF BIRTH:  12-23-1940  DATE OF PROCEDURE:  03/12/2012 DATE OF DISCHARGE:                              OPERATIVE REPORT   PREOPERATIVE DIAGNOSIS:  End-stage arthritis and degenerative joint disease, right hip.  POSTOP DIAGNOSIS:  End-stage arthritis and degenerative joint disease, right hip.  PROCEDURE:  Right total hip arthroplasty through direct anterior approach.  IMPLANTS:  DePuy Sector Gription acetabular component size 54, size 36+ 4 neutral polyethylene liner, size 12 Corail femoral component (KLA), size 36 -2 metal hip ball.  SURGEON:  Vanita Panda. Magnus Ivan, MD.  ANESTHESIA:  General.  ANTIBIOTICS:  2 g IV Ancef.  ESTIMATED BLOOD LOSS:  650 mL.  COMPLICATIONS:  None.  INDICATIONS:  Mr. Kratochvil is a 71 year old gentleman with worsening right hip pain and well documented evidence of right hip osteoarthritis.  It has been hurting him for well over 5 years now and had been getting progressively worse.  X-rays show end-stage arthritis with complete loss of the joint space, sclerotic changes in both acetabulum and femoral head and cystic changes in the femoral head.  With the failure of conservative treatment, he wished to proceed with a total hip arthroplasty.  The risks and benefits surgery have been explained him in detail, and he does wish to proceed.  PROCEDURE DESCRIPTION:  After informed consent was obtained, appropriate right hip was marked.  He was brought to the operating room.  General anesthesia was obtained while he was on the stretcher.  A Foley catheter was placed and then traction boots were placed on his feet, so he could be placed supine on the Hana fracture table with the perineal post in place and both legs in inline skeletal traction, but no  traction applied.  His right operative hip was then prepped and draped with DuraPrep and sterile drapes.  Time-out was called to identify correct patient, correct right hip.  I then made an incision just posterior and distal to the anterior-superior iliac spine and carried this obliquely down the leg.  I dissected down to the tensor fascia lata, which was then divided longitudinally secondary to proceed with a direct anterior approach to the hip.  Cobra retractors were placed around the lateral neck and then up underneath the rectus femoris.  A medial retractor was placed.  I cauterized the lateral femoral circumflex vessels.  I then divided the hip capsule and put the retractors within the hip capsule. I then made my femoral neck cut just proximal to the lesser trochanter with an oscillating saw and completed this with an osteotome.  I then removed the femoral head in its entirety.  I cleaned the acetabulum debris and put a bent Hohmann medially and a Cobra retractor laterally. I then began reaming from size 44 in 2 mm in even increments all the way up to size 54, with all reamers placed under direct visualization and the last reamer also placed under direct fluoroscopy, so I could obtain my inclination and version as well as depth of reaming.  Once I was pleased with this.  I placed the real size 54 Sector Gription acetabular component from [employer].  I did not need to place any screw holes.  I placed the real 36+ 4 neutral polyethylene liner.  Attention was then turned to the femur.  We made sure that all protectors was applied to the leg and then we externally rotated the right leg to 90 degrees, extended it and adducted it.  This allowed access to the femur.  Mueller retractors were placed medially and a Cobra retractor laterally.  I released the lateral capsule and the piriformis and then began broaching from a size 8 broach up to a size 12.  My first broach did penetrate medially,  which I did recognize but then the rest of the broach was well past this with a size 12 broach far passed this filling the canal.  I trialed in a 36+ 1.5 head in a standard neck, and then following with varus offset KLA neck and a -2 head.  I was pleased with the leg lengths on this with minimal shuck and good rotation.  I then removed all trial components and placed the real size 12 femoral component and the real 36- 2 neutral metal hip ball.  We reduced this back in the acetabulum.  It was nice and stable, and I was pleased with the alignment on the direct fluoroscopy.  We then copiously irrigated the tissues with normal saline solution.  I was able closed the joint capsule with #1 Ethibond suture followed by a running 0 V-Loc suture in the tensor fascia lata, 2-0 Vicryl in the subcutaneous tissue, and staples on the skin.  He was taken off the Hana table, awakened, extubated, and taken to recovery room in stable condition.  All final counts were correct.  His leg lengths were felt to be equal, and there were no complications noted.     Vanita Panda. Magnus Ivan, M.D.     CYB/MEDQ  D:  03/12/2012  T:  03/13/2012  Job:  [id number]

## 2012-03-13 NOTE — Progress Notes (Signed)
Patient ID: Bill [name], male   DOB: 03-03-1941, 71 y.o.   MRN: [id number] Patient requests foley to stay in at least thru the morning due to issues of leaking bladder.  This is reasonable given recent surgery.  Can remove later today.

## 2012-03-13 NOTE — Progress Notes (Signed)
Physical Therapy Treatment Patient Details Name: Bill [name] MRN: [id number] DOB: 1940-07-09 Today's Date: 03/13/2012 Time: 0347-4259 PT Time Calculation (min): 21 min  PT Assessment / Plan / Recommendation Comments on Treatment Session       Follow Up Recommendations  Home health PT     Does the patient have the potential to tolerate intense rehabilitation     Barriers to Discharge        Equipment Recommendations  Rolling walker with 5" wheels    Recommendations for Other Services OT consult  Frequency 7X/week   Plan      Precautions / Restrictions Precautions Precautions: Fall Restrictions Weight Bearing Restrictions: No Other Position/Activity Restrictions: WBAT   Pertinent Vitals/Pain     Mobility  Bed Mobility Bed Mobility: Sit to Supine Sit to Supine: 3: Mod assist Details for Bed Mobility Assistance: cues for use of UEs and L LE to self assist Transfers Transfers: Sit to Stand;Stand to Sit Sit to Stand: 1: +2 Total assist Sit to Stand: Patient Percentage: 70% Stand to Sit: 1: +2 Total assist Stand to Sit: Patient Percentage: 70% Details for Transfer Assistance: cues for use of UEs and for LE placement Ambulation/Gait Ambulation/Gait Assistance: 3: Mod assist;4: Min assist Ambulation Distance (Feet): 70 Feet Assistive device: Rolling walker Ambulation/Gait Assistance Details: cues for posture, sequence, position from RW and stride length Gait Pattern: Step-to pattern    Exercises     PT Diagnosis:    PT Problem List:   PT Treatment Interventions:     PT Goals Acute Rehab PT Goals PT Goal Formulation: With patient Time For Goal Achievement: 03/18/12 Potential to Achieve Goals: Good Pt will go Supine/Side to Sit: with supervision PT Goal: Supine/Side to Sit - Progress: Progressing toward goal Pt will go Sit to Supine/Side: with supervision PT Goal: Sit to Supine/Side - Progress: Progressing toward goal Pt will go Sit to Stand: with  supervision PT Goal: Sit to Stand - Progress: Progressing toward goal Pt will go Stand to Sit: with supervision PT Goal: Stand to Sit - Progress: Progressing toward goal Pt will Ambulate: 51 - 150 feet;with supervision;with rolling walker PT Goal: Ambulate - Progress: Progressing toward goal  Visit Information  Last PT Received On: 03/13/12 Assistance Needed: +2    Subjective Data  Patient Stated Goal: Resume previous lifestyle with decreased pain   Cognition  Overall Cognitive Status: Appears within functional limits for tasks assessed/performed Arousal/Alertness: Awake/alert Orientation Level: Appears intact for tasks assessed Behavior During Session: [facility] for tasks performed    Balance     End of Session PT - End of Session Equipment Utilized During Treatment: Gait belt Activity Tolerance: Patient tolerated treatment well Patient left: with call bell/phone within reach;in bed;with family/visitor present Nurse Communication: Mobility status   GP     Bill [name] 03/13/2012, 4:20 PM

## 2012-03-14 ENCOUNTER — Inpatient Hospital Stay (HOSPITAL_COMMUNITY): Payer: Medicare HMO

## 2012-03-14 LAB — CBC
Platelets: 180 10*3/uL (ref 150–400)
RBC: 3.08 MIL/uL — ABNORMAL LOW (ref 4.22–5.81)
WBC: 6.9 10*3/uL (ref 4.0–10.5)

## 2012-03-14 LAB — GLUCOSE, CAPILLARY
Glucose-Capillary: 135 mg/dL — ABNORMAL HIGH (ref 70–99)
Glucose-Capillary: 119 mg/dL — ABNORMAL HIGH (ref 70–99)

## 2012-03-14 NOTE — Progress Notes (Signed)
Subjective: 2 Days Post-Op Procedure(s) (LRB): TOTAL HIP ARTHROPLASTY ANTERIOR APPROACH (Right) Patient reports pain as moderate.  Urinary retention yesterday, better this am.  Objective: Vital signs in last 24 hours: Temp:  [98.3 F (36.8 C)-100.2 F (37.9 C)] 99 F (37.2 C) (10/24 0646) Pulse Rate:  [70-102] 90  (10/24 0646) Resp:  [16-20] 16  (10/24 0646) BP: (108-145)/(65-79) 145/79 mmHg (10/24 0646) SpO2:  [87 %-99 %] 90 % (10/24 0646) FiO2 (%):  [2 %] 2 % (10/23 1543)  Intake/Output from previous day: 10/23 0701 - 10/24 0700 In: 1399.6 [P.O.:840; I.V.:559.6] Out: 1400 [Urine:1400] Intake/Output this shift:     Basename 03/14/12 0505 03/13/12 0440  HGB 10.1* 10.4*    Basename 03/14/12 0505 03/13/12 0440  WBC 6.9 6.5  RBC 3.08* 3.20*  HCT 29.7* 31.0*  PLT 180 185    Basename 03/13/12 0440 03/11/12 1045  NA 131* 137  K 4.9 5.0  CL 98 101  CO2 25 27  BUN 23 15  CREATININE 1.15 1.1  GLUCOSE 184* 102*  CALCIUM 8.2* 9.7   No results found for this basename: LABPT:2,INR:2 in the last 72 hours  Sensation intact distally Intact pulses distally Dorsiflexion/Plantar flexion intact Incision: no drainage No cellulitis present  Assessment/Plan: 2 Days Post-Op Procedure(s) (LRB): TOTAL HIP ARTHROPLASTY ANTERIOR APPROACH (Right) Up with therapy Plan for discharge tomorrow  Bill [name] 03/14/2012, 7:33 AM

## 2012-03-14 NOTE — Progress Notes (Signed)
03/14/2012 Raynelle Bring BSN CCM [phone] Pt's spouse  requesting 3n1 for patient. Recommendations for elongated 3n1 from PT. [employer] does not have elongated 3n1 available,. Can do special ordr which will take 2 weeks for delivery. CM researched for another vendor. [employer] has available elongated 3n1 and is in network with [employer]. Can deliver DME to patient's room. Face sheet, DME-orders faxed to [phone] with confirmation.

## 2012-03-14 NOTE — Progress Notes (Signed)
Physical Therapy Treatment Patient Details Name: Bill [name] MRN: [id number] DOB: 12-16-1940 Today's Date: 03/14/2012 Time: 0865-7846 PT Time Calculation (min): 28 min  PT Assessment / Plan / Recommendation Comments on Treatment Session       Follow Up Recommendations  Home health PT     Does the patient have the potential to tolerate intense rehabilitation     Barriers to Discharge        Equipment Recommendations  Rolling walker with 5" wheels    Recommendations for Other Services OT consult  Frequency 7X/week   Plan Discharge plan remains appropriate    Precautions / Restrictions Precautions Precautions: Fall Restrictions Weight Bearing Restrictions: No Other Position/Activity Restrictions: WBAT   Pertinent Vitals/Pain Pt reports min pain at rest but 9/10 pain with WB and attempts to advance R LE.  Pt premedicated, ice pack provided, RN aware    Mobility  Bed Mobility Bed Mobility: Supine to Sit Supine to Sit: 4: Min assist;3: Mod assist Details for Bed Mobility Assistance: cues for use of UEs and L LE to self assist Transfers Transfers: Sit to Stand;Stand to Sit Sit to Stand: 1: +2 Total assist Sit to Stand: Patient Percentage: 80% Stand to Sit: 1: +2 Total assist Stand to Sit: Patient Percentage: 90% Details for Transfer Assistance: cues for use of UEs and for LE placement Ambulation/Gait Ambulation/Gait Assistance: 3: Mod assist;4: Min assist Ambulation Distance (Feet): 27 Feet Assistive device: Rolling walker Ambulation/Gait Assistance Details: cues for sequence, posture and position from RW.  Pt limited by pain with WB and attempts to advance R LE Gait Pattern: Step-to pattern    Exercises Total Joint Exercises Ankle Circles/Pumps: AROM;Supine;Both;20 reps Quad Sets: AROM;Supine;Both;15 reps Gluteal Sets: AROM;Both;15 reps;Supine Heel Slides: AAROM;Supine;Right;20 reps Hip ABduction/ADduction: AAROM;Right;Supine;20 reps   PT Diagnosis:    PT  Problem List:   PT Treatment Interventions:     PT Goals Acute Rehab PT Goals PT Goal Formulation: With patient Time For Goal Achievement: 03/18/12 Potential to Achieve Goals: Good Pt will go Supine/Side to Sit: with supervision PT Goal: Supine/Side to Sit - Progress: Progressing toward goal Pt will go Sit to Supine/Side: with supervision PT Goal: Sit to Supine/Side - Progress: Progressing toward goal Pt will go Sit to Stand: with supervision PT Goal: Sit to Stand - Progress: Progressing toward goal Pt will go Stand to Sit: with supervision PT Goal: Stand to Sit - Progress: Progressing toward goal Pt will Ambulate: 51 - 150 feet;with supervision;with rolling walker PT Goal: Ambulate - Progress: Progressing toward goal  Visit Information  Last PT Received On: 03/14/12 Assistance Needed: +2    Subjective Data  Subjective: I can't get myself up from the bed Patient Stated Goal: Resume previous lifestyle with decreased pain   Cognition  Overall Cognitive Status: Appears within functional limits for tasks assessed/performed Arousal/Alertness: Awake/alert Orientation Level: Appears intact for tasks assessed Behavior During Session: [facility] for tasks performed    Balance     End of Session PT - End of Session Equipment Utilized During Treatment: Gait belt Activity Tolerance: Patient tolerated treatment well Patient left: with call bell/phone within reach;in bed;with family/visitor present Nurse Communication: Mobility status   GP     Bill [name] 03/14/2012, 12:42 PM

## 2012-03-14 NOTE — Progress Notes (Signed)
Physical Therapy Treatment Patient Details Name: Bill [name] MRN: [id number] DOB: 1940/08/23 Today's Date: 03/14/2012 Time: 0630-1601 PT Time Calculation (min): 46 min  PT Assessment / Plan / Recommendation Comments on Treatment Session  Pt progressing slowly with gait 2* c/o pain with WB.  Discussed with pt and spouse short term rehab prior to d/c home but pt is adamant he will be fine at home.  Alerted Child psychotherapist to possible need for ambulance transport if pt d/c home in am.    Follow Up Recommendations  Home health PT     Does the patient have the potential to tolerate intense rehabilitation     Barriers to Discharge        Equipment Recommendations  3 in 1 bedside comode (with elongated seat.)    Recommendations for Other Services OT consult  Frequency 7X/week   Plan Discharge plan remains appropriate    Precautions / Restrictions Precautions Precautions: Fall Restrictions Weight Bearing Restrictions: No Other Position/Activity Restrictions: WBAT   Pertinent Vitals/Pain 5/10 increased to 9/10 with ambulation: pt premedicated with Pain MEDS and received muscle relaxer during session.  Ice packs provided    Mobility  Bed Mobility Bed Mobility: Sit to Supine Sit to Supine: 4: Min assist;3: Mod assist Details for Bed Mobility Assistance: cues for use of UEs and L LE to self assist Transfers Transfers: Sit to Stand;Stand to Sit Sit to Stand: 1: +2 Total assist;With upper extremity assist;From chair/3-in-1;With armrests Sit to Stand: Patient Percentage: 80% Stand to Sit: 1: +2 Total assist;With upper extremity assist;With armrests;To chair/3-in-1 Stand to Sit: Patient Percentage: 90% Details for Transfer Assistance: max cues for hand placement and RLE management. Ambulation/Gait Ambulation/Gait Assistance: 4: Min assist Ambulation Distance (Feet): 36 Feet Assistive device: Rolling walker Ambulation/Gait Assistance Details: cues for posture, stride length, position  from RW, increased UE WB Gait Pattern: Step-to pattern Gait velocity: slooooooooooooooooooow General Gait Details: Pt reluctant to WB on R LE 2* c/o pain    Exercises Total Joint Exercises Ankle Circles/Pumps: AROM;Supine;Both;20 reps Quad Sets: AROM;Supine;Both;15 reps Gluteal Sets: AROM;Both;15 reps;Supine Heel Slides: AAROM;Supine;Right;20 reps Hip ABduction/ADduction: AAROM;Right;Supine;20 reps   PT Diagnosis:    PT Problem List:   PT Treatment Interventions:     PT Goals Acute Rehab PT Goals PT Goal Formulation: With patient Time For Goal Achievement: 03/18/12 Potential to Achieve Goals: Good Pt will go Supine/Side to Sit: with supervision PT Goal: Supine/Side to Sit - Progress: Progressing toward goal Pt will go Sit to Supine/Side: with supervision PT Goal: Sit to Supine/Side - Progress: Progressing toward goal Pt will go Sit to Stand: with supervision PT Goal: Sit to Stand - Progress: Progressing toward goal Pt will go Stand to Sit: with supervision PT Goal: Stand to Sit - Progress: Progressing toward goal Pt will Ambulate: 51 - 150 feet;with supervision;with rolling walker PT Goal: Ambulate - Progress: Progressing toward goal  Visit Information  Last PT Received On: 03/14/12 Assistance Needed: +2    Subjective Data  Subjective: It hurts when I put weight on it.  Can't I do something instead of walking? Patient Stated Goal: Resume previous lifestyle with decreased pain   Cognition  Overall Cognitive Status: Appears within functional limits for tasks assessed/performed Arousal/Alertness: Awake/alert Orientation Level: Appears intact for tasks assessed Behavior During Session: [facility] for tasks performed    Balance     End of Session PT - End of Session Equipment Utilized During Treatment: Gait belt Activity Tolerance: Patient tolerated treatment well;Patient limited by pain;Patient limited by  fatigue Patient left: with call bell/phone within reach;in bed;with  family/visitor present Nurse Communication: Mobility status   GP     Verdia Bolt 03/14/2012, 4:44 PM

## 2012-03-14 NOTE — Evaluation (Signed)
Occupational Therapy Evaluation Patient Details Name: Bill [name] MRN: [id number] DOB: [birth year] Today's Date: 03/14/2012 Time: 1421-1450 OT Time Calculation (min): 29 min  OT Assessment / Plan / Recommendation Clinical Impression  Pt presents POD 2 RTHR (anterior approach). Pt progressing slowly. Skilled OT indicated to maximize independence with BADLs to supervision/min A level in prep for safe d/c home with HHOT.    OT Assessment  Patient needs continued OT Services    Follow Up Recommendations  Home health OT;Supervision/Assistance - 24 hour    Barriers to Discharge      Equipment Recommendations  3 in 1 bedside comode (with elongated seat.)    Recommendations for Other Services    Frequency  Min 2X/week    Precautions / Restrictions Precautions Precautions: Fall Restrictions Weight Bearing Restrictions: No   Pertinent Vitals/Pain Reported 8/10 pain with mobility. Repositioned for comfort.    ADL  Grooming: Simulated;Set up Where Assessed - Grooming: Unsupported sitting Upper Body Bathing: Simulated;Set up Where Assessed - Upper Body Bathing: Unsupported sitting Lower Body Bathing: Simulated;Moderate assistance Where Assessed - Lower Body Bathing: Supported sit to stand Upper Body Dressing: Simulated;Set up Where Assessed - Upper Body Dressing: Unsupported sitting Lower Body Dressing: Performed;Moderate assistance Where Assessed - Lower Body Dressing: Supported sit to stand;Unsupported sitting Toilet Transfer: Simulated;+2 Total assistance Toilet Transfer: Patient Percentage: 80% Toilet Transfer Method: Sit to stand Toilet Transfer Equipment: Other (comment) (recliner) Toileting - Clothing Manipulation and Hygiene: Simulated;Minimal assistance Where Assessed - Toileting Clothing Manipulation and Hygiene: Sit to stand from 3-in-1 or toilet Transfers/Ambulation Related to ADLs: Pt was only able to take a few small steps before c/o R shooting pain down leg. ADL  Comments: Educated pt & wife (who is a pediatric OT) in the use of AE for LB ADLs. More practice needed for proficiency. Pt & wife also stated that bathroom was too small for 3n1 or transfer bench. Wife stated pt will be spongebathing until able to step into shower. Also educated that pt would have to use 3n1 as BSC. It is unlikely at this point if pt would be able to complete sit<>stand from std height toilet.    OT Diagnosis: Generalized weakness  OT Problem List: Decreased activity tolerance;Decreased safety awareness;Decreased knowledge of use of DME or AE;Pain OT Treatment Interventions: Self-care/ADL training;Therapeutic activities;DME and/or AE instruction;Patient/family education   OT Goals Acute Rehab OT Goals OT Goal Formulation: With patient/family Time For Goal Achievement: 03/21/12 Potential to Achieve Goals: Good ADL Goals Pt Will Perform Grooming: with supervision;Standing at sink ADL Goal: Grooming - Progress: Goal set today Pt Will Perform Lower Body Bathing: with min assist;Sit to stand from chair;Sit to stand from bed ADL Goal: Lower Body Bathing - Progress: Goal set today Pt Will Perform Lower Body Dressing: with min assist;Sit to stand from chair;Sit to stand from bed ADL Goal: Lower Body Dressing - Progress: Goal set today Pt Will Transfer to Toilet: with supervision;3-in-1;Ambulation;Stand pivot transfer ADL Goal: Toilet Transfer - Progress: Goal set today Pt Will Perform Toileting - Clothing Manipulation: with supervision;Sitting on 3-in-1 or toilet;Standing ADL Goal: Toileting - Clothing Manipulation - Progress: Goal set today Pt Will Perform Toileting - Hygiene: with supervision;Sit to stand from 3-in-1/toilet ADL Goal: Toileting - Hygiene - Progress: Goal set today  Visit Information  Last OT Received On: 03/14/12 Assistance Needed: +2    Subjective Data  Subjective: I just can' lift my foot up. Patient Stated Goal: Return home tomorrow.   Prior  Functioning  Home Living Bathroom Shower/Tub: Engineer, manufacturing systems: Standard Communication Communication: No difficulties Dominant Hand: Right         Vision/Perception     Cognition  Overall Cognitive Status: Appears within functional limits for tasks assessed/performed Arousal/Alertness: Awake/alert Orientation Level: Appears intact for tasks assessed Behavior During Session: [facility] for tasks performed    Extremity/Trunk Assessment Right Upper Extremity Assessment RUE ROM/Strength/Tone: [facility] for tasks assessed Left Upper Extremity Assessment LUE ROM/Strength/Tone: WFL for tasks assessed     Mobility Transfers Sit to Stand: 1: +2 Total assist;With upper extremity assist;From chair/3-in-1;With armrests Sit to Stand: Patient Percentage: 80% Stand to Sit: 1: +2 Total assist;With upper extremity assist;With armrests;To chair/3-in-1 Stand to Sit: Patient Percentage: 90% Details for Transfer Assistance: max cues for hand placement and RLE management.     Shoulder Instructions     Exercise     Balance     End of Session OT - End of Session Activity Tolerance: Patient limited by pain Patient left: in chair;with call bell/phone within reach;with family/visitor present  GO     Maybelline Kolarik A OTR/L [phone] 03/14/2012, 3:17 PM

## 2012-03-14 NOTE — Care Management Note (Signed)
  Page 2 of 2   03/14/2012     10:34:44 AM   CARE MANAGEMENT NOTE 03/14/2012  Patient:  Bill [name], Bill [name]   Account Number:  [id number]  Date Initiated:  03/13/2012  Documentation initiated by:  Colleen Can  Subjective/Objective Assessment:   dx severe osteoarthritis right hip; anterior hip replacemnt     Action/Plan:   Cm spoke with patient. Plans are for patient to go home to [facility] where spouse will be caregiver. Wants [facility] for HHpt. Will need  Rw   Anticipated DC Date:  03/15/2012   Anticipated DC Plan:  HOME W HOME HEALTH SERVICES  In-house referral  NA      DC Planning Services  CM consult      [facility] Choice  HOME HEALTH  DURABLE MEDICAL EQUIPMENT   Choice offered to / List presented to:  C-3 Spouse   DME arranged  [facility]      DME agency  [facility]     HH arranged  HH-2 PT      HH agency  [facility]   Status of service:  In process, will continue to follow Medicare Important Message given?  NO (If response is "NO", the following Medicare IM given date fields will be blank) Date Medicare IM given:   Date Additional Medicare IM given:    Discharge Disposition:    Per UR Regulation:    If discussed at Long Length of Stay Meetings, dates discussed:    Comments:  03/13/2012 Raynelle Bring BSN CCM [phone] [facility]  advised of request and can service patient. [employer] called-spoke with Okey Regal /RW will be delivered to patient's rm. Face sheet , op note, dme orders faxed [phone].

## 2012-03-15 LAB — GLUCOSE, CAPILLARY
Glucose-Capillary: 117 mg/dL — ABNORMAL HIGH (ref 70–99)
Glucose-Capillary: 144 mg/dL — ABNORMAL HIGH (ref 70–99)
Glucose-Capillary: 125 mg/dL — ABNORMAL HIGH (ref 70–99)

## 2012-03-15 LAB — CBC
Platelets: 161 10*3/uL (ref 150–400)
RBC: 2.72 MIL/uL — ABNORMAL LOW (ref 4.22–5.81)
WBC: 6.3 10*3/uL (ref 4.0–10.5)

## 2012-03-15 NOTE — Progress Notes (Addendum)
Occupational Therapy Treatment Patient Details Name: Bill [name] MRN: [id number] DOB: 06/18/1940 Today's Date: 03/15/2012 Time: 1145-1210 [phone] and 12-1 - 12 16) OT Time Calculation (min): 25 min  OT Assessment / Plan / Recommendation Comments on Treatment Session      Follow Up Recommendations  Home health OT;Supervision/Assistance - 24 hour    Barriers to Discharge       Equipment Recommendations  3 in 1 bedside comode    Recommendations for Other Services    Frequency     Plan      Precautions / Restrictions Precautions Precautions: Fall Restrictions Other Position/Activity Restrictions: WBAT   Pertinent Vitals/Pain R hip sore; repositioned and better when not weight bearing    ADL  Toilet Transfer: Performed;Minimal assistance; min cues for hand placement Toilet Transfer Method: Stand pivot Toilet Transfer Equipment: Bedside commode Toileting - Clothing Manipulation and Hygiene: Performed;Set up Where Assessed - Toileting Clothing Manipulation and Hygiene: Lean right and/or left Transfers/Ambulation Related to ADLs: ambulated with PT then put on 3:1 commode.  Reviewed tub transfer with pt/wife.  He is not ready to practice yet ADL Comments: reviewed AE.  Wife plans to buy hip kit this afternoon.  Pt/family with no other questions    OT Diagnosis:    OT Problem List:   OT Treatment Interventions:     OT Goals ADL Goals Pt Will Transfer to Toilet: with supervision;3-in-1;Ambulation;Stand pivot transfer ADL Goal: Toilet Transfer - Progress: Progressing toward goals Pt Will Perform Toileting - Hygiene: with supervision;Sit to stand from 3-in-1/toilet ADL Goal: Toileting - Hygiene - Progress: Progressing toward goals  Visit Information  Last OT Received On: 03/15/12 Assistance Needed: +2 (ambulation)    Subjective Data      Prior Functioning       Cognition  Overall Cognitive Status: Appears within functional limits for tasks  assessed/performed Arousal/Alertness: Awake/alert Orientation Level: Appears intact for tasks assessed Behavior During Session: [facility] for tasks performed    Mobility  Shoulder Instructions Transfers Sit to Stand: 4: Min assist;From chair/3-in-1;With armrests       Exercises      Balance     End of Session OT - End of Session Activity Tolerance: Patient limited by fatigue Patient left: in chair;with call bell/phone within reach;with family/visitor present  GO     Bill [name] 03/15/2012, 12:48 PM Bill [name], OTR/L [phone] 03/15/2012

## 2012-03-15 NOTE — Progress Notes (Signed)
Physical Therapy Treatment Patient Details Name: Bill [name] MRN: [id number] DOB: 02/06/1941 Today's Date: 03/15/2012 Time: 6213-0865 PT Time Calculation (min): 29 min  PT Assessment / Plan / Recommendation Comments on Treatment Session       Follow Up Recommendations  Home health PT     Does the patient have the potential to tolerate intense rehabilitation     Barriers to Discharge        Equipment Recommendations  3 in 1 bedside comode    Recommendations for Other Services OT consult  Frequency 7X/week   Plan Discharge plan remains appropriate    Precautions / Restrictions Precautions Precautions: Fall Restrictions Weight Bearing Restrictions: No Other Position/Activity Restrictions: WBAT   Pertinent Vitals/Pain 5/10; premedicated,     Mobility  Bed Mobility Bed Mobility: Supine to Sit Supine to Sit: 4: Min assist Details for Bed Mobility Assistance: Pt rolled to side to move supine to sit Transfers Transfers: Sit to Stand;Stand to Sit Sit to Stand: 4: Min assist;From chair/3-in-1;With armrests Sit to Stand: Patient Percentage: 80% Stand to Sit: 4: Min assist Details for Transfer Assistance: max cues for hand placement and RLE management. Ambulation/Gait Ambulation/Gait Assistance: 4: Min assist Ambulation Distance (Feet): 72 Feet Assistive device: Rolling walker Ambulation/Gait Assistance Details: cues for sequence, posture and position from RW Gait Pattern: Step-to pattern Gait velocity: slooooooooooooooooooow General Gait Details: Marked improvement in WB tolerance vs last session    Exercises Total Joint Exercises Ankle Circles/Pumps: AROM;Supine;Both;20 reps Quad Sets: AROM;Supine;Both;20 reps Gluteal Sets: AROM;Both;Supine;20 reps Heel Slides: AAROM;Supine;Right;20 reps Hip ABduction/ADduction: AAROM;Right;Supine;20 reps   PT Diagnosis:    PT Problem List:   PT Treatment Interventions:     PT Goals Acute Rehab PT Goals PT Goal Formulation:  With patient Time For Goal Achievement: 03/18/12 Potential to Achieve Goals: Good Pt will go Supine/Side to Sit: with supervision PT Goal: Supine/Side to Sit - Progress: Progressing toward goal Pt will go Sit to Supine/Side: with supervision PT Goal: Sit to Supine/Side - Progress: Progressing toward goal Pt will go Sit to Stand: with supervision PT Goal: Sit to Stand - Progress: Progressing toward goal Pt will go Stand to Sit: with supervision PT Goal: Stand to Sit - Progress: Progressing toward goal Pt will Ambulate: 51 - 150 feet;with supervision;with rolling walker PT Goal: Ambulate - Progress: Progressing toward goal Pt will Go Up / Down Stairs: 3-5 stairs;with min assist;with least restrictive assistive device  Visit Information  Last PT Received On: 03/15/12 Assistance Needed: +2 (ambulation)    Subjective Data  Subjective: I'm feeling better than yesterday Patient Stated Goal: Resume previous lifestyle with decreased pain   Cognition  Overall Cognitive Status: Appears within functional limits for tasks assessed/performed Arousal/Alertness: Awake/alert Orientation Level: Appears intact for tasks assessed Behavior During Session: [name] for tasks performed    Balance     End of Session PT - End of Session Equipment Utilized During Treatment: Gait belt Activity Tolerance: Patient tolerated treatment well Patient left: in chair;with call bell/phone within reach;with family/visitor present Nurse Communication: Mobility status   GP     Bill [name] 03/15/2012, 1:07 PM

## 2012-03-15 NOTE — Progress Notes (Signed)
Subjective: 3 Days Post-Op Procedure(s) (LRB): TOTAL HIP ARTHROPLASTY ANTERIOR APPROACH (Right) Patient reports pain as severe.  Only with weight-bearing.  New x-ray of right hip shows implant in good position post-op and no evidence of fracture.  Asymptomatic acute blood loss anemia.  Objective: Vital signs in last 24 hours: Temp:  [97.6 F (36.4 C)-98.8 F (37.1 C)] 98.8 F (37.1 C) (10/25 0621) Pulse Rate:  [78-88] 80  (10/25 0621) Resp:  [16-20] 20  (10/25 0621) BP: (107-116)/(68-77) 115/74 mmHg (10/25 0621) SpO2:  [93 %-98 %] 93 % (10/25 0625)  Intake/Output from previous day: 10/24 0701 - 10/25 0700 In: 300 [P.O.:300] Out: 1695 [Urine:1695] Intake/Output this shift:     Basename 03/15/12 0445 03/14/12 0505 03/13/12 0440  HGB 9.0* 10.1* 10.4*    Basename 03/15/12 0445 03/14/12 0505  WBC 6.3 6.9  RBC 2.72* 3.08*  HCT 26.2* 29.7*  PLT 161 180    Basename 03/13/12 0440  NA 131*  K 4.9  CL 98  CO2 25  BUN 23  CREATININE 1.15  GLUCOSE 184*  CALCIUM 8.2*   No results found for this basename: LABPT:2,INR:2 in the last 72 hours  No acute changes on exam.  Incision looks good. NVI.  Assessment/Plan: 3 Days Post-Op Procedure(s) (LRB): TOTAL HIP ARTHROPLASTY ANTERIOR APPROACH (Right) Up with therapy Plan for discharge tomorrow  [name] 03/15/2012, 7:25 AM

## 2012-03-15 NOTE — Progress Notes (Signed)
03/15/2012 Damaris Schooner RN CCM [phone] Elongated 3n1 has been delivered to patient's room.

## 2012-03-15 NOTE — Progress Notes (Signed)
Physical Therapy Treatment Patient Details Name: Bill [name] MRN: [id number] DOB: [birth year] Today's Date: 03/15/2012 Time: 4098-1191 PT Time Calculation (min): 23 min  PT Assessment / Plan / Recommendation Comments on Treatment Session  Pt progressing slowly with gait 2* c/o pain with WB.  Discussed with pt and spouse short term rehab prior to d/c home but pt is adamant he will be fine at home.  Alerted Child psychotherapist to possible need for ambulance transport if pt d/c home in am.    Follow Up Recommendations  Home health PT     Does the patient have the potential to tolerate intense rehabilitation     Barriers to Discharge        Equipment Recommendations  3 in 1 bedside comode    Recommendations for Other Services OT consult  Frequency 7X/week   Plan Discharge plan remains appropriate    Precautions / Restrictions Precautions Precautions: Fall Restrictions Weight Bearing Restrictions: No Other Position/Activity Restrictions: WBAT   Pertinent Vitals/Pain 3/10    Mobility  Bed Mobility Bed Mobility: Sit to Supine Sit to Supine: 4: Min assist Details for Bed Mobility Assistance: cues for sequence and assist for R LE Transfers Transfers: Sit to Stand;Stand to Sit Sit to Stand: 4: Min guard;From chair/3-in-1;With armrests Stand to Sit: 4: Min assist;To bed Details for Transfer Assistance: max cues for hand placement and RLE management. Ambulation/Gait Ambulation/Gait Assistance: 4: Min assist Ambulation Distance (Feet): 75 Feet Assistive device: Rolling walker Ambulation/Gait Assistance Details: cues for posture and position from RW Gait Pattern: Step-to pattern Stairs: Yes Stairs Assistance: 4: Min assist Stairs Assistance Details (indicate cue type and reason): cues for sequence and foot placement Stair Management Technique: Two rails;Forwards Number of Stairs: 3  (twice)    Exercises     PT Diagnosis:    PT Problem List:   PT Treatment Interventions:      PT Goals Acute Rehab PT Goals PT Goal Formulation: With patient Time For Goal Achievement: 03/18/12 Potential to Achieve Goals: Good Pt will go Supine/Side to Sit: with supervision PT Goal: Supine/Side to Sit - Progress: Progressing toward goal Pt will go Sit to Supine/Side: with supervision PT Goal: Sit to Supine/Side - Progress: Progressing toward goal Pt will go Sit to Stand: with supervision PT Goal: Sit to Stand - Progress: Progressing toward goal Pt will go Stand to Sit: with supervision PT Goal: Stand to Sit - Progress: Progressing toward goal Pt will Ambulate: 51 - 150 feet;with supervision;with rolling walker PT Goal: Ambulate - Progress: Progressing toward goal Pt will Go Up / Down Stairs: 3-5 stairs;with min assist;with least restrictive assistive device PT Goal: Up/Down Stairs - Progress: Progressing toward goal  Visit Information  Last PT Received On: 03/15/12 Assistance Needed: +1    Subjective Data  Subjective: I don't want to push myself too hard Patient Stated Goal: Resume previous lifestyle with decreased pain   Cognition  Overall Cognitive Status: Appears within functional limits for tasks assessed/performed Arousal/Alertness: Awake/alert Orientation Level: Appears intact for tasks assessed Behavior During Session: [facility] for tasks performed    Balance     End of Session PT - End of Session Equipment Utilized During Treatment: Gait belt Activity Tolerance: Patient tolerated treatment well Patient left: in chair;with call bell/phone within reach;with family/visitor present Nurse Communication: Mobility status   GP     Bill [name] 03/15/2012, 4:13 PM

## 2012-03-15 NOTE — Anesthesia Postprocedure Evaluation (Signed)
  Anesthesia Post-op Note  Patient: Bill [name]  Procedure(s) Performed: Procedure(s) (LRB): TOTAL HIP ARTHROPLASTY ANTERIOR APPROACH (Right)  Patient Location: PACU  Anesthesia Type: general  Level of Consciousness: awake and alert   Airway and Oxygen Therapy: Patient Spontanous Breathing  Post-op Pain: mild  Post-op Assessment: Post-op Vital signs reviewed, Patient's Cardiovascular Status Stable, Respiratory Function Stable, Patent Airway and No signs of Nausea or vomiting  Post-op Vital Signs: stable  Complications: No apparent anesthesia complications

## 2012-03-16 LAB — GLUCOSE, CAPILLARY: Glucose-Capillary: 138 mg/dL — ABNORMAL HIGH (ref 70–99)

## 2012-03-16 MED ORDER — METHOCARBAMOL 500 MG PO TABS: 500.0000 mg | ORAL_TABLET | Freq: Four times a day (QID) | ORAL | Status: DC | PRN

## 2012-03-16 MED ORDER — ASPIRIN EC 325 MG PO TBEC: 325.0000 mg | DELAYED_RELEASE_TABLET | Freq: Two times a day (BID) | ORAL | Status: DC

## 2012-03-16 MED ORDER — OXYCODONE-ACETAMINOPHEN 5-325 MG PO TABS: 1.0000 | ORAL_TABLET | ORAL | Status: DC | PRN

## 2012-03-16 NOTE — Progress Notes (Signed)
Physical Therapy Treatment Patient Details Name: Bill [name] MRN: [id number] DOB: Jun 11, 1940 Today's Date: 03/16/2012 Time: 3086-5784 PT Time Calculation (min): 38 min  PT Assessment / Plan / Recommendation Comments on Treatment Session       Follow Up Recommendations  Home health PT     Does the patient have the potential to tolerate intense rehabilitation     Barriers to Discharge        Equipment Recommendations  3 in 1 bedside comode    Recommendations for Other Services OT consult  Frequency 7X/week   Plan Discharge plan remains appropriate    Precautions / Restrictions Precautions Precautions: Fall Restrictions Weight Bearing Restrictions: No Other Position/Activity Restrictions: WBAT   Pertinent Vitals/Pain     Mobility  Bed Mobility Bed Mobility: Supine to Sit Supine to Sit: 5: Supervision Details for Bed Mobility Assistance: cues for sequence and assist for R LE Transfers Transfers: Sit to Stand;Stand to Sit Sit to Stand: 4: Min guard;5: Supervision;From bed;With upper extremity assist Stand to Sit: 4: Min guard;5: Supervision;To chair/3-in-1;With armrests;With upper extremity assist Details for Transfer Assistance: min cues for use of UEs Ambulation/Gait Ambulation/Gait Assistance: 4: Min guard;5: Supervision Ambulation Distance (Feet): 121 Feet Assistive device: Rolling walker Ambulation/Gait Assistance Details: min cues for posture and position from RW Gait Pattern: Step-to pattern Stairs: No (Pt and spouse state they feel confident in ability)    Exercises Total Joint Exercises Ankle Circles/Pumps: AROM;Supine;Both;20 reps Quad Sets: AROM;Supine;Both;20 reps Gluteal Sets: AROM;Both;Supine;20 reps Heel Slides: AAROM;Supine;Right;20 reps Hip ABduction/ADduction: AAROM;Right;Supine;20 reps   PT Diagnosis:    PT Problem List:   PT Treatment Interventions:     PT Goals Acute Rehab PT Goals PT Goal Formulation: With patient Time For Goal  Achievement: 03/18/12 Potential to Achieve Goals: Good Pt will go Supine/Side to Sit: with supervision PT Goal: Supine/Side to Sit - Progress: Met Pt will go Sit to Supine/Side: with supervision PT Goal: Sit to Supine/Side - Progress: Progressing toward goal Pt will go Sit to Stand: with supervision PT Goal: Sit to Stand - Progress: Met Pt will go Stand to Sit: with supervision PT Goal: Stand to Sit - Progress: Met Pt will Ambulate: 51 - 150 feet;with supervision;with rolling walker PT Goal: Ambulate - Progress: Met Pt will Go Up / Down Stairs: 3-5 stairs;with min assist;with least restrictive assistive device PT Goal: Up/Down Stairs - Progress: Progressing toward goal  Visit Information  Last PT Received On: 03/16/12 Assistance Needed: +1    Subjective Data  Subjective: Ican do this, I'm gonig home Patient Stated Goal: Resume previous lifestyle with decreased pain   Cognition  Overall Cognitive Status: Appears within functional limits for tasks assessed/performed Arousal/Alertness: Awake/alert Orientation Level: Appears intact for tasks assessed Behavior During Session: [facility] for tasks performed    Balance     End of Session PT - End of Session Equipment Utilized During Treatment: Gait belt Activity Tolerance: Patient tolerated treatment well Patient left: Other (comment) (bathroom) Nurse Communication: Mobility status   GP     Keri Veale 03/16/2012, 12:47 PM

## 2012-03-16 NOTE — Discharge Summary (Signed)
Patient ID: Bill [name] MRN: [id number] DOB/AGE: 1941-05-03 71 y.o.  Admit date: 03/12/2012 Discharge date: 03/16/2012  Admission Diagnoses:  Principal Problem:  *Degenerative arthritis of hip   Discharge Diagnoses:  Same  Past Medical History  Diagnosis Date  . Hypertension   . T2DM (type 2 diabetes mellitus) 1980s  . Exogenous obesity   . Urge incontinence   . Lyme disease 2012    ?titers negative  . History of chicken pox   . Mild aortic stenosis 12/2010    Echo 12/29/10: Mild LVH, EF 50-55%, normal wall motion, mild aortic stenosis, trivial AI, mean gradient 19 mmHg, mild LAE  . Kidney stones remote  . GERD (gastroesophageal reflux disease)     occasional  . Atrial fibrillation     Surgeries: Procedure(s): TOTAL HIP ARTHROPLASTY ANTERIOR APPROACH on 03/12/2012   Consultants:    Discharged Condition: Improved  Hospital Course: Bill [name] is an 71 y.o. male who was admitted 03/12/2012 for operative treatment ofDegenerative arthritis of hip. Patient has severe unremitting pain that affects sleep, daily activities, and work/hobbies. After pre-op clearance the patient was taken to the operating room on 03/12/2012 and underwent  Procedure(s): TOTAL HIP ARTHROPLASTY ANTERIOR APPROACH.    Patient was given perioperative antibiotics: Anti-infectives     Start     Dose/Rate Route Frequency Ordered Stop   03/12/12 1800   ceFAZolin (ANCEF) IVPB 2 g/50 mL premix        2 g 100 mL/hr over 30 Minutes Intravenous Every 6 hours 03/12/12 1610 03/13/12 0105   03/12/12 1009   ceFAZolin (ANCEF) 3 g in dextrose 5 % 50 mL IVPB        3 g 160 mL/hr over 30 Minutes Intravenous 60 min pre-op 03/12/12 1009 03/12/12 1245           Patient was given sequential compression devices, early ambulation, and chemoprophylaxis to prevent DVT.  Patient benefited maximally from hospital stay and there were no complications.    Recent vital signs: Patient Vitals for the past 24 hrs:  BP  Temp Temp src Pulse Resp SpO2  03/16/12 0946 138/75 mmHg - - - - -  03/16/12 0629 136/77 mmHg 98.1 F (36.7 C) Oral 81  16  94 %  03/16/12 0439 - - - - 14  93 %  03/16/12 0033 - 97.8 F (36.6 C) Oral - 14  94 %  03/15/12 2101 119/60 mmHg 101.1 F (38.4 C) Oral 95  16  94 %  03/15/12 2100 119/60 mmHg 101.1 F (38.4 C) Oral 95  16  94 %  03/15/12 2026 - - - - 16  95 %  03/15/12 1440 133/63 mmHg 99.9 F (37.7 C) - 92  16  94 %     Recent laboratory studies:  Basename 03/15/12 0445 04-04-2012 0505  WBC 6.3 6.9  HGB 9.0* 10.1*  HCT 26.2* 29.7*  PLT 161 180  NA -- --  K -- --  CL -- --  CO2 -- --  BUN -- --  CREATININE -- --  GLUCOSE -- --  INR -- --  CALCIUM -- --     Discharge Medications:     Medication List     As of 03/16/2012 10:40 AM    STOP taking these medications         ibuprofen 800 MG tablet   Commonly known as: ADVIL,MOTRIN      TAKE these medications         aspirin EC 325  MG tablet   Take 1 tablet (325 mg total) by mouth 2 (two) times daily.      enalapril 10 MG tablet   Commonly known as: VASOTEC   Take 10 mg by mouth every morning.      furosemide 20 MG tablet   Commonly known as: LASIX   Take 1 tablet (20 mg total) by mouth daily.      glipiZIDE 10 MG tablet   Commonly known as: GLUCOTROL   Take 10 mg by mouth daily.      metFORMIN 1000 MG tablet   Commonly known as: GLUCOPHAGE   Take 1 tablet (1,000 mg total) by mouth 2 (two) times daily with a meal.      methocarbamol 500 MG tablet   Commonly known as: ROBAXIN   Take 1 tablet (500 mg total) by mouth every 6 (six) hours as needed.      MULTIVITAMIN PO   Take 5 tablets by mouth daily. Multivitamin packet containing 5 tablets.      oxybutynin 15 MG 24 hr tablet   Commonly known as: DITROPAN XL   Take 1 tablet (15 mg total) by mouth daily.      oxyCODONE-acetaminophen 5-325 MG per tablet   Commonly known as: PERCOCET/ROXICET   Take 1-2 tablets by mouth every 4 (four) hours as  needed for pain. For pain      SOFT TOUCH LANCET DEVICE Misc   100 strips by Does not apply route 2 (two) times daily.        Diagnostic Studies: Dg Hip Complete Right  03/12/2012  *RADIOLOGY REPORT*  Clinical Data: Right total hip replacement  RIGHT HIP - COMPLETE 2+ VIEW  Comparison: Right hip films of 12/22/2011  Findings: C-arm spot film show a right total hip replacement.  The femoral and acetabular components appear to be in good position on the images obtained.  IMPRESSION: Right total hip replacement.   Original Report Authenticated By: Juline Patch, M.D.    Dg Pelvis Portable  03/12/2012  *RADIOLOGY REPORT*  Clinical Data: Right total hip prosthesis placement  PORTABLE PELVIS  Comparison: None.  Findings: The right total hip prosthesis is in place.  Acetabular shell angle satisfactory at 37 degrees.  No fracture or complicating feature observed.  IMPRESSION: 1.  Right total hip prosthesis is in place without complicating feature observed.   Original Report Authenticated By: Dellia Cloud, M.D.    Dg Hip Portable 1 View Right  03/14/2012  *RADIOLOGY REPORT*  Clinical Data: Increasing right hip pain post right total hip replacement  PORTABLE RIGHT HIP - 1 VIEW  Comparison: Postoperative right hip radiographs - 03/12/2012  Findings:  Stable sequela of right total hip replacement.  No definite evidence of hardware failure or loosening. No definite fracture or dislocation on this single AP provided radiograph.  Skin staples overlie the superior lateral aspect of the right hip. There is a minimal amount of expected adjacent subcutaneous emphysema.  No radiopaque foreign body.  IMPRESSION: Post right total hip replacement without definite complicating feature on this AP portable examination.   Original Report Authenticated By: Waynard Reeds, M.D.    Dg Hip Portable 1 View Right  03/12/2012  *RADIOLOGY REPORT*  Clinical Data: Right total hip prosthesis placement.  PORTABLE RIGHT HIP  - 1 VIEW  Comparison: 03/12/2012  Findings: The right total hip prosthesis is in place.  Proximal femur along the stem appears normal.  The acetabular cup anteversion appears within normal limits.  IMPRESSION:  1.  Right total hip prosthesis noted without complicating feature.   Original Report Authenticated By: Dellia Cloud, M.D.    Dg C-arm 1-60 Min-no Report  03/12/2012  CLINICAL DATA: surgery   C-ARM 1-60 MINUTES  Fluoroscopy was utilized by the requesting physician.  No radiographic  interpretation.      Disposition: 01-Home or Self Care      Discharge Orders    Future Appointments: Provider: Department: Dept Phone: Center:   03/29/2012 10:30 AM [facility] [phone] None   03/29/2012 12:00 PM Marinus Maw, MD [facility] [phone] [facility]     Future Orders Please Complete By Expires   Diet - low sodium heart healthy      Call MD / Call 911      Comments:   If you experience chest pain or shortness of breath, CALL 911 and be transported to the hospital emergency room.  If you develope a fever above 101 F, pus (white drainage) or increased drainage or redness at the wound, or calf pain, call your surgeon's office.   Constipation Prevention      Comments:   Drink plenty of fluids.  Prune juice may be helpful.  You may use a stool softener, such as Colace (over the counter) 100 mg twice a day.  Use MiraLax (over the counter) for constipation as needed.   Increase activity slowly as tolerated      Discharge instructions      Comments:   Expect right thigh, leg, ankle and foot swelling. You can get your incision wet in the shower starting 03/17/12; then place new dry dressing daily.   Discharge patient         Follow-up Information    Follow up with [name], MD. In 2 weeks.   Contact information:   [address] [place] [address] [phone]           Signed: [name] 03/16/2012, 10:40 AM

## 2012-03-16 NOTE — Progress Notes (Signed)
Patient and spouse verbalized understanding of discharge instructions.  Pt assessment has not changed from am. Patient was given d/c forms and prescriptions.

## 2012-03-17 ENCOUNTER — Encounter: Payer: Self-pay | Admitting: Family Medicine

## 2012-03-27 ENCOUNTER — Encounter: Payer: Self-pay | Admitting: Family Medicine

## 2012-03-29 ENCOUNTER — Encounter: Payer: Self-pay | Admitting: Internal Medicine

## 2012-03-29 ENCOUNTER — Ambulatory Visit (INDEPENDENT_AMBULATORY_CARE_PROVIDER_SITE_OTHER): Payer: Medicare HMO | Admitting: Internal Medicine

## 2012-03-29 ENCOUNTER — Ambulatory Visit (HOSPITAL_COMMUNITY): Payer: Medicare HMO | Attending: Internal Medicine | Admitting: Radiology

## 2012-03-29 VITALS — BP 115/65 | HR 69 | Ht 74.4 in | Wt 276.0 lb

## 2012-03-29 DIAGNOSIS — I080 Rheumatic disorders of both mitral and aortic valves: Secondary | ICD-10-CM | POA: Insufficient documentation

## 2012-03-29 DIAGNOSIS — I10 Essential (primary) hypertension: Secondary | ICD-10-CM | POA: Insufficient documentation

## 2012-03-29 DIAGNOSIS — I4891 Unspecified atrial fibrillation: Secondary | ICD-10-CM

## 2012-03-29 DIAGNOSIS — E119 Type 2 diabetes mellitus without complications: Secondary | ICD-10-CM | POA: Insufficient documentation

## 2012-03-29 DIAGNOSIS — I359 Nonrheumatic aortic valve disorder, unspecified: Secondary | ICD-10-CM

## 2012-03-29 DIAGNOSIS — I350 Nonrheumatic aortic (valve) stenosis: Secondary | ICD-10-CM

## 2012-03-29 NOTE — Assessment & Plan Note (Signed)
His murmur appears very mild. Await 2-D echo.

## 2012-03-29 NOTE — Assessment & Plan Note (Signed)
His diagnosis of atrial fibrillation is in doubt. He is not on anticoagulation and despite consideration on my behalf for taking anticoagulation, he is interested only taking aspirin. He will undergo watchful waiting.

## 2012-03-29 NOTE — Progress Notes (Signed)
Echocardiogram performed.  

## 2012-03-29 NOTE — Progress Notes (Signed)
HPI Mr. Morocho returns today for followup. He is a very pleasant 71 year old man with a history of palpitations, questionable atrial fibrillation, with no clear-cut documentation. In the interim, the patient has done well. He denies chest pain or shortness of breath. He underwent recent hip surgery and has had a nice recovery. He has minimal if any pain in his left hip. No syncope. He does have a history of mild aortic stenosis and underwent recent 2-D echo, the results are currently pending. No Known Allergies   Current Outpatient Prescriptions  Medication Sig Dispense Refill  . aspirin EC 325 MG tablet Take 1 tablet (325 mg total) by mouth 2 (two) times daily.  60 tablet  0  . enalapril (VASOTEC) 10 MG tablet Take 10 mg by mouth every morning.      . furosemide (LASIX) 20 MG tablet Take 1 tablet (20 mg total) by mouth daily.  30 tablet  6  . glipiZIDE (GLUCOTROL) 10 MG tablet Take 10 mg by mouth daily.       Demetra [employer] (SOFT TOUCH LANCET DEVICE) MISC 100 strips by Does not apply route 2 (two) times daily.  3 each  3  . metFORMIN (GLUCOPHAGE) 1000 MG tablet Take 1 tablet (1,000 mg total) by mouth 2 (two) times daily with a meal.  180 tablet  3  . methocarbamol (ROBAXIN) 500 MG tablet Take 1 tablet (500 mg total) by mouth every 6 (six) hours as needed.  60 tablet  0  . Multiple Vitamins-Minerals (MULTIVITAMIN PO) Take 5 tablets by mouth daily. Multivitamin packet containing 5 tablets.      Marland Kitchen oxybutynin (DITROPAN XL) 15 MG 24 hr tablet Take 1 tablet (15 mg total) by mouth daily.  90 tablet  3  . oxyCODONE-acetaminophen (PERCOCET/ROXICET) 5-325 MG per tablet Take 1-2 tablets by mouth every 4 (four) hours as needed for pain. For pain  60 tablet  0     Past Medical History  Diagnosis Date  . Hypertension   . Type 2 diabetes, controlled, with retinopathy 1980s  . Exogenous obesity   . Urge incontinence   . Lyme disease 2012    ?titers negative  . History of chicken pox   . Mild aortic  stenosis 12/2010    Echo 12/29/10: Mild LVH, EF 50-55%, normal wall motion, mild aortic stenosis, trivial AI, mean gradient 19 mmHg, mild LAE  . Kidney stones remote  . GERD (gastroesophageal reflux disease)     occasional  . Atrial fibrillation     ROS:   All systems reviewed and negative except as noted in the HPI.   Past Surgical History  Procedure Date  . Kidney stone removal   . Finger sx   . US echocardiography 12/2010    EF 50-55%, mild LVH, normal wall motion, high LV filling pressures, mild AS, LA mildly dilated  . Back surgery 20 yrs ago    removal of lower back cartilage due to damage. Patient does not remember date of surgery.  . Total hip arthroplasty 03/12/2012    RIGHT TOTAL HIP ARTHROPLASTY ANTERIOR APPROACH;  Surgeon: [name], MD;     Family History  Problem Relation Age of Onset  . Diabetes Father   . Diabetes Mother   . Heart disease Father   . Heart disease Mother   . Cancer Brother     throat     History   Social History  . Marital Status: Married    Spouse Name: N/A  Number of Children: N/A  . Years of Education: N/A   Occupational History  . Not on file.   Social History Main Topics  . Smoking status: Never Smoker   . Smokeless tobacco: Never Used  . Alcohol Use: Yes     Comment: Regular (bourbon and coke 3-4/day)  . Drug Use: No  . Sexually Active: Not on file   Other Topics Concern  . Not on file   Social History Narrative   Caffeine: coffee 1 cup/dayLives with wife Lupita Leash), 1 catOccupation: retired, worked for [employer]: BS     BP 115/65  Pulse 69  Ht 6' 2.4" (1.89 m)  Wt 276 lb (125.193 kg)  BMI 35.06 kg/m2  Physical Exam:  Well appearing obese, 71 year old man, NAD HEENT: Unremarkable Neck:  No JVD, no thyromegally Lungs:  Clear with no wheezes, rales, or rhonchi. HEART:  Regular rate rhythm, no murmurs, no rubs, no clicks Abd:  soft, positive bowel sounds, no organomegally, no rebound, no  guarding Ext:  2 plus pulses, no edema, no cyanosis, no clubbing Skin:  No rashes no nodules Neuro:  CN II through XII intact, motor grossly intact  Assess/Plan:

## 2012-04-02 ENCOUNTER — Encounter: Payer: Self-pay | Admitting: Physician Assistant

## 2012-04-02 ENCOUNTER — Telehealth: Payer: Self-pay | Admitting: *Deleted

## 2012-04-02 DIAGNOSIS — I350 Nonrheumatic aortic (valve) stenosis: Secondary | ICD-10-CM

## 2012-04-02 NOTE — Telephone Encounter (Signed)
Message copied by Tarri Fuller on Tue Apr 02, 2012  2:16 PM ------      Message from: [place], [place] T      Created: Tue Apr 02, 2012  8:22 AM       Normal EF      Moderate AS      Repeat echo in 1 year      Tereso Newcomer, [place]  8:20 AM 04/02/2012

## 2012-04-02 NOTE — Telephone Encounter (Signed)
pt notified about echo results w/verbal understanding today 

## 2012-08-25 ENCOUNTER — Other Ambulatory Visit: Payer: Self-pay | Admitting: Family Medicine

## 2012-08-25 DIAGNOSIS — E559 Vitamin D deficiency, unspecified: Secondary | ICD-10-CM

## 2012-08-25 DIAGNOSIS — Z125 Encounter for screening for malignant neoplasm of prostate: Secondary | ICD-10-CM

## 2012-08-25 DIAGNOSIS — D649 Anemia, unspecified: Secondary | ICD-10-CM

## 2012-08-25 DIAGNOSIS — E11319 Type 2 diabetes mellitus with unspecified diabetic retinopathy without macular edema: Secondary | ICD-10-CM

## 2012-08-25 DIAGNOSIS — E785 Hyperlipidemia, unspecified: Secondary | ICD-10-CM

## 2012-08-25 DIAGNOSIS — I10 Essential (primary) hypertension: Secondary | ICD-10-CM

## 2012-09-02 ENCOUNTER — Other Ambulatory Visit (INDEPENDENT_AMBULATORY_CARE_PROVIDER_SITE_OTHER): Payer: Medicare HMO

## 2012-09-02 DIAGNOSIS — D649 Anemia, unspecified: Secondary | ICD-10-CM

## 2012-09-02 DIAGNOSIS — E559 Vitamin D deficiency, unspecified: Secondary | ICD-10-CM

## 2012-09-02 DIAGNOSIS — E785 Hyperlipidemia, unspecified: Secondary | ICD-10-CM

## 2012-09-02 DIAGNOSIS — E1139 Type 2 diabetes mellitus with other diabetic ophthalmic complication: Secondary | ICD-10-CM

## 2012-09-02 DIAGNOSIS — E11319 Type 2 diabetes mellitus with unspecified diabetic retinopathy without macular edema: Secondary | ICD-10-CM

## 2012-09-02 DIAGNOSIS — Z125 Encounter for screening for malignant neoplasm of prostate: Secondary | ICD-10-CM

## 2012-09-02 LAB — MICROALBUMIN / CREATININE URINE RATIO
Creatinine,U: 173.9 mg/dL
Microalb Creat Ratio: 12.2 mg/g (ref 0.0–30.0)

## 2012-09-02 LAB — CBC WITH DIFFERENTIAL/PLATELET
Basophils Absolute: 0 10*3/uL (ref 0.0–0.1)
Basophils Relative: 0.2 % (ref 0.0–3.0)
Eosinophils Absolute: 0.3 10*3/uL (ref 0.0–0.7)
Lymphocytes Relative: 14 % (ref 12.0–46.0)
MCHC: 33.6 g/dL (ref 30.0–36.0)
MCV: 96.3 fl (ref 78.0–100.0)
Monocytes Absolute: 0.6 10*3/uL (ref 0.1–1.0)
Neutro Abs: 5.1 10*3/uL (ref 1.4–7.7)
Neutrophils Relative %: 73 % (ref 43.0–77.0)
RDW: 14.1 % (ref 11.5–14.6)

## 2012-09-02 LAB — COMPREHENSIVE METABOLIC PANEL
BUN: 22 mg/dL (ref 6–23)
CO2: 27 mEq/L (ref 19–32)
Calcium: 9.7 mg/dL (ref 8.4–10.5)
Chloride: 102 mEq/L (ref 96–112)
Creatinine, Ser: 1.3 mg/dL (ref 0.4–1.5)
GFR: 56.87 mL/min — ABNORMAL LOW (ref >60.00)
Glucose, Bld: 120 mg/dL — ABNORMAL HIGH (ref 70–99)
Total Bilirubin: 0.5 mg/dL (ref 0.3–1.2)

## 2012-09-02 LAB — IBC PANEL
Iron: 88 ug/dL (ref 42–165)
Saturation Ratios: 26.2 % (ref 20.0–50.0)
Transferrin: 240.3 mg/dL (ref 212.0–360.0)

## 2012-09-02 LAB — LIPID PANEL
Cholesterol: 175 mg/dL (ref 0–200)
HDL: 38.3 mg/dL — ABNORMAL LOW (ref >39.00)
Triglycerides: 87 mg/dL (ref 0.0–149.0)

## 2012-09-02 LAB — HEMOGLOBIN A1C: Hgb A1c MFr Bld: 7.6 % — ABNORMAL HIGH (ref 4.6–6.5)

## 2012-09-03 LAB — VITAMIN D 25 HYDROXY (VIT D DEFICIENCY, FRACTURES): Vit D, 25-Hydroxy: 25 ng/mL — ABNORMAL LOW (ref 30–89)

## 2012-09-05 ENCOUNTER — Ambulatory Visit (INDEPENDENT_AMBULATORY_CARE_PROVIDER_SITE_OTHER): Payer: Medicare HMO | Admitting: Family Medicine

## 2012-09-05 ENCOUNTER — Encounter: Payer: Self-pay | Admitting: Family Medicine

## 2012-09-05 VITALS — BP 124/76 | HR 80 | Temp 98.0°F | Ht 74.0 in | Wt 272.5 lb

## 2012-09-05 DIAGNOSIS — E875 Hyperkalemia: Secondary | ICD-10-CM

## 2012-09-05 DIAGNOSIS — Z1211 Encounter for screening for malignant neoplasm of colon: Secondary | ICD-10-CM

## 2012-09-05 DIAGNOSIS — R972 Elevated prostate specific antigen [PSA]: Secondary | ICD-10-CM | POA: Insufficient documentation

## 2012-09-05 DIAGNOSIS — I10 Essential (primary) hypertension: Secondary | ICD-10-CM

## 2012-09-05 DIAGNOSIS — H6192 Disorder of left external ear, unspecified: Secondary | ICD-10-CM

## 2012-09-05 DIAGNOSIS — Z0000 Encounter for general adult medical examination without abnormal findings: Secondary | ICD-10-CM

## 2012-09-05 DIAGNOSIS — G479 Sleep disorder, unspecified: Secondary | ICD-10-CM

## 2012-09-05 DIAGNOSIS — E559 Vitamin D deficiency, unspecified: Secondary | ICD-10-CM

## 2012-09-05 DIAGNOSIS — E1139 Type 2 diabetes mellitus with other diabetic ophthalmic complication: Secondary | ICD-10-CM

## 2012-09-05 DIAGNOSIS — Z87898 Personal history of other specified conditions: Secondary | ICD-10-CM

## 2012-09-05 DIAGNOSIS — M169 Osteoarthritis of hip, unspecified: Secondary | ICD-10-CM

## 2012-09-05 DIAGNOSIS — E785 Hyperlipidemia, unspecified: Secondary | ICD-10-CM

## 2012-09-05 DIAGNOSIS — E669 Obesity, unspecified: Secondary | ICD-10-CM

## 2012-09-05 DIAGNOSIS — E538 Deficiency of other specified B group vitamins: Secondary | ICD-10-CM | POA: Insufficient documentation

## 2012-09-05 DIAGNOSIS — H6190 Disorder of external ear, unspecified, unspecified ear: Secondary | ICD-10-CM

## 2012-09-05 DIAGNOSIS — E11319 Type 2 diabetes mellitus with unspecified diabetic retinopathy without macular edema: Secondary | ICD-10-CM

## 2012-09-05 MED ORDER — METFORMIN HCL 1000 MG PO TABS: 1000.0000 mg | ORAL_TABLET | Freq: Two times a day (BID) | ORAL | Status: DC

## 2012-09-05 MED ORDER — FUROSEMIDE 20 MG PO TABS: 20.0000 mg | ORAL_TABLET | Freq: Every day | ORAL | Status: DC

## 2012-09-05 MED ORDER — GLIPIZIDE 10 MG PO TABS: 10.0000 mg | ORAL_TABLET | Freq: Every day | ORAL | Status: DC

## 2012-09-05 MED ORDER — ENALAPRIL MALEATE 10 MG PO TABS: 10.0000 mg | ORAL_TABLET | Freq: Every morning | ORAL | Status: DC

## 2012-09-05 MED ORDER — IBUPROFEN 800 MG PO TABS: 800.0000 mg | ORAL_TABLET | Freq: Two times a day (BID) | ORAL | Status: DC | PRN

## 2012-09-05 MED ORDER — LISINOPRIL 20 MG PO TABS: 20.0000 mg | ORAL_TABLET | Freq: Every day | ORAL | Status: DC

## 2012-09-05 MED ORDER — OXYBUTYNIN CHLORIDE ER 15 MG PO TB24: 15.0000 mg | ORAL_TABLET | Freq: Every day | ORAL | Status: DC

## 2012-09-05 NOTE — Assessment & Plan Note (Signed)
Chronic, stable. States enalapril very expensive - will switch to lisinopril. Pt agrees with plan.

## 2012-09-05 NOTE — Assessment & Plan Note (Signed)
New dx - nationwide shortage of B12 injections. Recommended start oral vit B12 daily. Will recheck at next blood work.

## 2012-09-05 NOTE — Assessment & Plan Note (Signed)
New, mildly elevated.  DRE with enlarged prostate but no nodularity or asymmetry.  Will recheck in 4-6 mo.  Denies urinary sxs.  Aware if persistently elevated may discuss urology referral.  Pt agrees with plan.  Consider checking free/bound PSA. Lab Results  Component Value Date   PSA 4.24* 09/02/2012   PSA 2.05 08/17/2011   PSA 2.89 08/18/2010

## 2012-09-05 NOTE — Progress Notes (Signed)
Subjective:    Patient ID: Bill [name], male    DOB: 1940-12-06, 72 y.o.   MRN: [id number]  HPI CC: medicare wellness visit  Wt Readings from Last 3 Encounters:  09/05/12 272 lb 8 oz (123.605 kg)  03/29/12 276 lb (125.193 kg)  03/12/12 283 lb (128.368 kg)  decreased portion sizes.    Screened positive with STOP BANG during recent hip surgery.  Denies snoring.  Wakes up feeling rested.  No daytime sleepiness.  No apneic episodes.  No PNDyspnea. Denies problems with this.  DM - eating more peanuts at night.  Has stopped and noticed sugars better controlled.  Lab Results  Component Value Date   HGBA1C 7.6* 09/02/2012    Also poorly healing wound left pinna present for last year.  Preventative:  Colonoscopy 2002, told normal, rpt 10 yrs. Would like yearly stool kits. H/o hemorrhoids.  Prostate screening - always normal. Denies nocturia, hesitancy or changes in stream. Elevated PSA this year.  Flu shot 2013.  Pneumonia shot 2007.  Tetanus shot - Td 2013 zostavax - discussed ,interested. Advanced directives: has living will at home.  Does not want prolonged life support "no extraordinary measures".  Would want wife to be HCPOA.  Hearing doing well.  Vision - trouble L eye - last screen was 02/2012 Marlyne Beards). Denies falls, depression, anhedonia.  Medications and allergies reviewed and updated in chart.  Past histories reviewed and updated if relevant as below. Patient Active Problem List  Diagnosis  . Type 2 diabetes, controlled, with retinopathy  . VITAMIN D DEFICIENCY  . HYPERLIPIDEMIA  . Obesity, unspecified  . HYPERTENSION  . EDEMA  . FALSE POSITIVE SEROLOGICAL TEST FOR SYPHILIS  . BENIGN PROSTATIC HYPERTROPHY, HX OF  . Abnormal EKG  . Aortic stenosis  . Urge incontinence  . Medicare annual wellness visit, initial  . Hyperkalemia  . Chronic lower back pain  . Right hip pain  . Atrial fibrillation  . Degenerative arthritis of hip   Past Medical History    Diagnosis Date  . Hypertension   . Type 2 diabetes, controlled, with retinopathy 1980s  . Exogenous obesity   . Urge incontinence   . Lyme disease 2012    ?titers negative  . History of chicken pox   . Mild aortic stenosis 12/2010    a. Echo 12/29/10: Mild LVH, EF 50-55%, normal wall motion, mild aortic stenosis, trivial AI, mean gradient 19 mmHg, mild LAE;  b. Echo 11/13:  mod LVH, vigorous LV EF 65-70%, mod AS, mean 23 mmHg, mild MR, mild BAE, mild RVE  . Kidney stones remote  . GERD (gastroesophageal reflux disease)     occasional  . Atrial fibrillation    Past Surgical History  Procedure Laterality Date  . Kidney stone removal    . Finger sx    . US echocardiography  12/2010    EF 50-55%, mild LVH, normal wall motion, high LV filling pressures, mild AS, LA mildly dilated  . Back surgery  20 yrs ago    removal of lower back cartilage due to damage. Patient does not remember date of surgery.  . Total hip arthroplasty  03/12/2012    RIGHT TOTAL HIP ARTHROPLASTY ANTERIOR APPROACH;  Surgeon: [name], MD;   History  Substance Use Topics  . Smoking status: Never Smoker   . Smokeless tobacco: Never Used  . Alcohol Use: Yes     Comment: Regular (bourbon and coke 3-4/day)   Family History  Problem Relation Age of  Onset  . Diabetes Father   . Diabetes Mother   . Heart disease Father   . Heart disease Mother   . Cancer Brother     throat   No Known Allergies Current Outpatient Prescriptions on File Prior to Visit  Medication Sig Dispense Refill  . enalapril (VASOTEC) 10 MG tablet Take 10 mg by mouth every morning.      . furosemide (LASIX) 20 MG tablet Take 1 tablet (20 mg total) by mouth daily.  30 tablet  6  . glipiZIDE (GLUCOTROL) 10 MG tablet Take 10 mg by mouth daily.       Demetra Sh[employer] (SOFT TOUCH LANCET DEVICE) MISC 100 strips by Does not apply route 2 (two) times daily.  3 each  3  . metFORMIN (GLUCOPHAGE) 1000 MG tablet Take 1 tablet (1,000 mg total)  by mouth 2 (two) times daily with a meal.  180 tablet  3  . Multiple Vitamins-Minerals (MULTIVITAMIN PO) Take 5 tablets by mouth daily. Multivitamin packet containing 5 tablets.      Marland Kitchen oxybutynin (DITROPAN XL) 15 MG 24 hr tablet Take 1 tablet (15 mg total) by mouth daily.  90 tablet  3  . methocarbamol (ROBAXIN) 500 MG tablet Take 1 tablet (500 mg total) by mouth every 6 (six) hours as needed.  60 tablet  0   No current facility-administered medications on file prior to visit.     Review of Systems  Constitutional: Negative for fever, chills, activity change, appetite change, fatigue and unexpected weight change.  HENT: Negative for hearing loss and neck pain.   Eyes: Negative for visual disturbance.  Respiratory: Negative for cough, chest tightness, shortness of breath and wheezing.   Cardiovascular: Negative for chest pain, palpitations and leg swelling.  Gastrointestinal: Negative for nausea, vomiting, abdominal pain, diarrhea, constipation, blood in stool and abdominal distention.  Genitourinary: Negative for hematuria and difficulty urinating.  Musculoskeletal: Negative for myalgias and arthralgias.  Skin: Negative for rash.  Neurological: Positive for headaches (x1 a few weeks ago). Negative for dizziness, seizures and syncope.  Hematological: Bruises/bleeds easily.  Psychiatric/Behavioral: Negative for dysphoric mood. The patient is not nervous/anxious.        Objective:   Physical Exam  Nursing note and vitals reviewed. Constitutional: He is oriented to person, place, and time. He appears well-developed and well-nourished. No distress.  HENT:  Head: Normocephalic and atraumatic.  Right Ear: Hearing, tympanic membrane and ear canal normal.  Left Ear: Hearing, tympanic membrane, external ear and ear canal normal.  Ears:  Nose: Nose normal.  Mouth/Throat: Oropharynx is clear and moist. No oropharyngeal exudate.  L pinna with unhealed small ulcer, no erythema.  Dressed with  triple abx and bandaid.  Eyes: Conjunctivae and EOM are normal. Pupils are equal, round, and reactive to light. No scleral icterus.  Neck: Normal range of motion. Neck supple. No thyromegaly present.  Cardiovascular: Normal rate, regular rhythm, normal heart sounds and intact distal pulses.   No murmur heard. Pulses:      Radial pulses are 2+ on the right side, and 2+ on the left side.  Pulmonary/Chest: Effort normal and breath sounds normal. No respiratory distress. He has no wheezes. He has no rales.  Abdominal: Soft. Bowel sounds are normal. He exhibits no distension and no mass. There is no tenderness. There is no rebound and no guarding.  Genitourinary: Rectum normal. Rectal exam shows no external hemorrhoid, no internal hemorrhoid, no fissure, no mass, no tenderness and anal tone normal.  Prostate is enlarged (mild, 20-25gm).  Musculoskeletal: Normal range of motion. He exhibits no edema.  Lymphadenopathy:    He has no cervical adenopathy.  Neurological: He is alert and oriented to person, place, and time.  CN grossly intact, station and gait intact  Skin: Skin is warm and dry. No rash noted.  Psychiatric: He has a normal mood and affect. His behavior is normal. Judgment and thought content normal.       Assessment & Plan:

## 2012-09-05 NOTE — Assessment & Plan Note (Signed)
I have personally reviewed the Medicare Annual Wellness questionnaire and have noted 1. The patient's medical and social history 2. Their use of alcohol, tobacco or illicit drugs 3. Their current medications and supplements 4. The patient's functional ability including ADL's, fall risks, home safety risks and hearing or visual impairment. 5. Diet and physical activity 6. Evidence for depression or mood disorders The patients weight, height, BMI have been recorded in the chart.  Hearing and vision has been addressed. I have made referrals, counseling and provided education to the patient based review of the above and I have provided the pt with a written personalized care plan for preventive services. See scanned questionairre. Advanced directives discussed: I asked him to bring me copy of his advanced directive.  Wife would be medical decision maker  Reviewed preventative protocols and updated unless pt declined. iFOB today.

## 2012-09-05 NOTE — Patient Instructions (Addendum)
Start aspirin 81mg  daily. Call your insurance about the shingles shot to see if it is covered or how much it would cost and where is cheaper (here or pharmacy).  If you want to receive here, call for nurse visit. Stop enalapril 10mg .  Start lisinopril 20mg  daily. Bring me copy of living will. Your vitamin D and B12 were low.  Start vit D 1000 units daily.  Start vit B12 daily. Good ot see you today, return in 4-6 months for follow up, prior fasting for blood work to recheck diabetes and prostate.

## 2012-09-05 NOTE — Assessment & Plan Note (Signed)
H/o this - see below regarding elevated PSA.

## 2012-09-05 NOTE — Assessment & Plan Note (Signed)
Chronic, off meds. Discussed #s and elevated LDL. Given hx diabetes, will want tighter chol control. Will work on diet changes.

## 2012-09-05 NOTE — Assessment & Plan Note (Signed)
Weight loss noted- since surgery more active and eating healthier.

## 2012-09-05 NOTE — Assessment & Plan Note (Signed)
Improved s/p hip replacement 2013.  Improved pain. Continues ibuprofen 800mg  bid but for neck/shoulder pain

## 2012-09-05 NOTE — Assessment & Plan Note (Signed)
Prior well controlled, now deteriorated.  Pt attributes to dietary indiscretions (peanuts at night). Encouraged be more adherent to diabetic diet. Return in 4-6 mo for recheck. Lab Results  Component Value Date   HGBA1C 7.6* 09/02/2012

## 2012-09-06 DIAGNOSIS — G479 Sleep disorder, unspecified: Secondary | ICD-10-CM | POA: Insufficient documentation

## 2012-09-06 DIAGNOSIS — H6190 Disorder of external ear, unspecified, unspecified ear: Secondary | ICD-10-CM | POA: Insufficient documentation

## 2012-09-06 NOTE — Assessment & Plan Note (Signed)
?  of - screened positive for sleep apnea but pt denies this. Will continue to monitor.

## 2012-09-06 NOTE — Assessment & Plan Note (Signed)
Persistently low. Recommended she start vit D 2000 IU supplement. Already taking ~3000 IU daily in multivitamins.

## 2012-09-06 NOTE — Assessment & Plan Note (Signed)
Present for 1year. Recommended treat with triple abx ointment or vaseline ointment for 1-2 wks, if not improved to notify me for referral to derm. Pt agrees with plan.

## 2012-09-10 ENCOUNTER — Other Ambulatory Visit: Payer: Medicare HMO

## 2012-09-10 ENCOUNTER — Telehealth: Payer: Self-pay

## 2012-09-10 DIAGNOSIS — Z1211 Encounter for screening for malignant neoplasm of colon: Secondary | ICD-10-CM

## 2012-09-10 LAB — FECAL OCCULT BLOOD, IMMUNOCHEMICAL: Fecal Occult Bld: NEGATIVE

## 2012-09-10 MED ORDER — OXYBUTYNIN CHLORIDE 5 MG PO TABS: 5.0000 mg | ORAL_TABLET | Freq: Three times a day (TID) | ORAL | Status: DC

## 2012-09-10 NOTE — Telephone Encounter (Signed)
Pt seen 09/05/12; pt thought was to stop oxybutynin 24 hr tab and substitute med was to be sent to pharmacy. Pt reviewed AVS and saw was to stop enalapril and start lisinopril. Pt said would finish taking the enalapril he has before taking the lisinopril. Oxybutynin 24 hr pill cost $ 120 and pt spoke with [facility]; can get Oxybutynin 5 mg immediate release for 0 copay. Pt wants to substitute if Dr Reece Agar thinks will be effective.Please advise. [employer].Pt request call back.

## 2012-09-10 NOTE — Telephone Encounter (Signed)
plz notify I've sent in oxybutynin immediate release instead yf XR - may start bid prn and raise to TID prn. Ok to finish enalapril and then start lisinopril.

## 2012-09-11 ENCOUNTER — Encounter: Payer: Self-pay | Admitting: *Deleted

## 2012-09-11 NOTE — Telephone Encounter (Signed)
Patient notified

## 2012-09-16 ENCOUNTER — Encounter: Payer: Self-pay | Admitting: Family Medicine

## 2012-09-16 ENCOUNTER — Ambulatory Visit (INDEPENDENT_AMBULATORY_CARE_PROVIDER_SITE_OTHER): Payer: Medicare HMO | Admitting: Family Medicine

## 2012-09-16 ENCOUNTER — Telehealth: Payer: Self-pay | Admitting: Family Medicine

## 2012-09-16 VITALS — BP 120/62 | HR 80 | Temp 98.3°F | Wt 273.0 lb

## 2012-09-16 DIAGNOSIS — R300 Dysuria: Secondary | ICD-10-CM

## 2012-09-16 DIAGNOSIS — N390 Urinary tract infection, site not specified: Secondary | ICD-10-CM

## 2012-09-16 LAB — POCT URINALYSIS DIPSTICK
Nitrite, UA: NEGATIVE
Urobilinogen, UA: NEGATIVE
pH, UA: 6

## 2012-09-16 MED ORDER — LEVOFLOXACIN 750 MG PO TABS: 750.0000 mg | ORAL_TABLET | Freq: Every day | ORAL | Status: AC

## 2012-09-16 NOTE — Progress Notes (Signed)
Subjective:    Patient ID: Bill [name], male    DOB: May 18, 1941, 72 y.o.   MRN: [id number]  HPI  72 yo pt of Dr. Reece Agar, new to me, here for ? UTI.  4 days of severe dysuria, increased urinary frequency and chills.  Feels like he is not emptying his bladder.  Has chronic back pain- feels that is unchanged.  No fever, nausea or vomiting.  He is a diabetic.  CBG this am was 132 at home.  Lab Results  Component Value Date   HGBA1C 7.6* 09/02/2012   Has had a UTI in past but was years ago.  Patient Active Problem List   Diagnosis Date Noted  . Earlobe lesion 09/06/2012  . Sleep disorder 09/06/2012  . Elevated PSA 09/05/2012  . Vitamin B12 deficiency 09/05/2012  . Degenerative arthritis of hip 03/12/2012  . Atrial fibrillation 03/11/2012  . Right hip pain 12/22/2011  . Medicare annual wellness visit, subsequent 08/23/2011  . Chronic lower back pain 08/23/2011  . Urge incontinence   . Aortic stenosis 01/05/2011  . Abnormal EKG 01/03/2011  . VITAMIN D DEFICIENCY 06/03/2008  . Obesity, unspecified 06/03/2008  . False positive serological test for syphilis 06/03/2008  . BENIGN PROSTATIC HYPERTROPHY, HX OF 06/03/2008  . Type 2 diabetes, controlled, with retinopathy 04/23/2007  . HYPERLIPIDEMIA 04/23/2007  . HYPERTENSION 12/03/2006   Past Medical History  Diagnosis Date  . Hypertension   . Type 2 diabetes, controlled, with retinopathy 1980s  . Exogenous obesity   . Urge incontinence   . Lyme disease 2012    ?titers negative  . History of chicken pox   . Mild aortic stenosis 12/2010    a. Echo 12/29/10: Mild LVH, EF 50-55%, normal wall motion, mild aortic stenosis, trivial AI, mean gradient 19 mmHg, mild LAE;  b. Echo 11/13:  mod LVH, vigorous LV EF 65-70%, mod AS, mean 23 mmHg, mild MR, mild BAE, mild RVE  . Kidney stones remote  . GERD (gastroesophageal reflux disease)     occasional  . Atrial fibrillation    Past Surgical History  Procedure Laterality Date  . Kidney stone  removal    . Finger sx    . US echocardiography  12/2010    EF 50-55%, mild LVH, normal wall motion, high LV filling pressures, mild AS, LA mildly dilated  . Back surgery  20 yrs ago    removal of lower back cartilage due to damage. Patient does not remember date of surgery.  . Total hip arthroplasty  03/12/2012    RIGHT TOTAL HIP ARTHROPLASTY ANTERIOR APPROACH;  Surgeon: [name], MD;   History  Substance Use Topics  . Smoking status: Never Smoker   . Smokeless tobacco: Never Used  . Alcohol Use: Yes     Comment: Regular (bourbon and coke 3-4/day)   Family History  Problem Relation Age of Onset  . Diabetes Father   . Diabetes Mother   . Heart disease Father   . Heart disease Mother   . Cancer Brother     throat   Allergies  Allergen Reactions  . Amlodipine Other (See Comments)    Raised blood pressure  . Tramadol    Current Outpatient Prescriptions on File Prior to Visit  Medication Sig Dispense Refill  . aspirin EC 81 MG tablet Take 81 mg by mouth daily.      . cholecalciferol (VITAMIN D) 1000 UNITS tablet Take 1,000 Units by mouth daily.      . cyanocobalamin  500 MCG tablet Take 500 mcg by mouth daily.      . fish oil-omega-3 fatty acids 1000 MG capsule Take 1 g by mouth daily.      . furosemide (LASIX) 20 MG tablet Take 1 tablet (20 mg total) by mouth daily.  90 tablet  3  . glipiZIDE (GLUCOTROL) 10 MG tablet Take 1 tablet (10 mg total) by mouth daily.  90 tablet  3  . ibuprofen (ADVIL,MOTRIN) 800 MG tablet Take 1 tablet (800 mg total) by mouth 2 (two) times daily as needed for pain.  180 tablet  3  . Lancet Devices (SOFT TOUCH LANCET DEVICE) MISC 100 strips by Does not apply route 2 (two) times daily.  3 each  3  . lisinopril (PRINIVIL,ZESTRIL) 20 MG tablet Take 1 tablet (20 mg total) by mouth daily.  90 tablet  3  . metFORMIN (GLUCOPHAGE) 1000 MG tablet Take 1 tablet (1,000 mg total) by mouth 2 (two) times daily with a meal.  180 tablet  3  . Multiple  Vitamins-Minerals (MULTIVITAMIN PO) Take 5 tablets by mouth daily. Multivitamin packet containing 5 tablets.      Marland Kitchen oxybutynin (DITROPAN) 5 MG tablet Take 1 tablet (5 mg total) by mouth 3 (three) times daily.  90 tablet  6  . [DISCONTINUED] oxybutynin (DITROPAN XL) 15 MG 24 hr tablet Take 1 tablet (15 mg total) by mouth daily.  90 tablet  3   No current facility-administered medications on file prior to visit.   The PMH, PSH, Social History, Family History, Medications, and allergies have been reviewed in [facility], and have been updated if relevant.   Review of Systems    See HPI Objective:   Physical Exam BP 120/62  Pulse 80  Temp(Src) 98.3 F (36.8 C)  Wt 258 lb (117.028 kg)  BMI 33.11 kg/m2 General:  overweght male in NAD Eyes:  PERRL Ears:  External ear exam shows no significant lesions or deformities.  Otoscopic examination reveals clear canals, tympanic membranes are intact bilaterally without bulging, retraction, inflammation or discharge. Hearing is grossly normal bilaterally. Nose:  External nasal examination shows no deformity or inflammation. Nasal mucosa are pink and moist without lesions or exudates. Mouth:  Oral mucosa and oropharynx without lesions or exudates.  Teeth in good repair. Neck:  no carotid bruit or thyromegaly no cervical or supraclavicular lymphadenopathy  Lungs:  Normal respiratory effort, chest expands symmetrically. Lungs are clear to auscultation, no crackles or wheezes. Heart:  Normal rate and regular rhythm. S1 and S2 normal without gallop, murmur, click, rub or other extra sounds. Abdomen:  Bowel sounds positive,abdomen soft and non-tender without masses, organomegaly or hernias noted. No supra pubic tenderness or CVA tenderness Pulses:  R and L posterior tibial pulses are full and equal bilaterally  Extremities:  no edema     Assessment & Plan:  1. Complicated UTI (urinary tract infection) UA pos for 3+ LE, 2+ glucose, 2+ blood and ketones. Will  treat for complicated UTI/Pyleo with levaquin 750 mg daily x 7 days. Send urine for cx.  Follow up with myself or Dr. Reece Agar next week for repeat UA. The patient indicates understanding of these issues and agrees with the plan.     POCT urinalysis dipstick - Urine culture

## 2012-09-16 NOTE — Patient Instructions (Addendum)
Nice to meet you. Please take Levaquin as directed- 1 tablet daily x 7 days. Drink plenty of fluids. Please come back to see me or Dr Reece Agar next week if no improvement.  We will call you in two days.

## 2012-09-16 NOTE — Telephone Encounter (Signed)
Patient Information:  Caller Name: Nilan  Phone: [phone]  Patient: Bill [name], Bill [name]  Gender: Male  DOB: June 10, 1940  Age: 72 Years  PCP: Eustaquio Boyden [facility]  Office Follow Up:  Does the office need to follow up with this patient?: No  Instructions For The Office: N/A   Symptoms  Reason For Call & Symptoms: I have a UTI and I need something for it.  Reviewed Health History In EMR: Yes  Reviewed Medications In EMR: Yes  Reviewed Allergies In EMR: Yes  Reviewed Surgeries / Procedures: Yes  Date of Onset of Symptoms: 09/12/2012  Guideline(s) Used:  Urination Pain - Male  Disposition Per Guideline:   See Today in Office  Reason For Disposition Reached:   All other males with painful urination, or patient wants to be seen  Advice Given:  Fluids  : Drink extra fluids (Reason: to produce a dilute, nonirritating urine).  Call Back If:  You become worse.  Patient Will Follow Care Advice:  YES  Appointment Scheduled:  09/16/2012 11:45:00 Appointment Scheduled Provider:  Ruthe Mannan [facility])  (Dr Reece Agar was not available today and RN scheduled appt with first available provider)

## 2012-12-03 ENCOUNTER — Telehealth: Payer: Self-pay

## 2012-12-03 NOTE — Telephone Encounter (Signed)
Pt left v/m if pt should be taking lisinopril or not. Pt said "for some reason he stopped taking a few months ago".Please advise.

## 2012-12-04 NOTE — Telephone Encounter (Signed)
Our plan was to finish enalapril and then start lisinopril - this is on his current med list so I do recommend he start taking. How are his blood pressures?

## 2012-12-05 NOTE — Telephone Encounter (Signed)
Spoke with patient's wife and notified her. She said she would inform patient. She thinks BP's have been fine, but would have patient call me back if they haven't been.

## 2012-12-31 ENCOUNTER — Other Ambulatory Visit (INDEPENDENT_AMBULATORY_CARE_PROVIDER_SITE_OTHER): Payer: Medicare HMO

## 2012-12-31 DIAGNOSIS — E785 Hyperlipidemia, unspecified: Secondary | ICD-10-CM

## 2012-12-31 DIAGNOSIS — I10 Essential (primary) hypertension: Secondary | ICD-10-CM

## 2012-12-31 DIAGNOSIS — R972 Elevated prostate specific antigen [PSA]: Secondary | ICD-10-CM

## 2012-12-31 DIAGNOSIS — E669 Obesity, unspecified: Secondary | ICD-10-CM

## 2012-12-31 DIAGNOSIS — E538 Deficiency of other specified B group vitamins: Secondary | ICD-10-CM

## 2012-12-31 LAB — BASIC METABOLIC PANEL
Calcium: 9.9 mg/dL (ref 8.4–10.5)
Creatinine, Ser: 1.3 mg/dL (ref 0.4–1.5)
GFR: 58.89 mL/min — ABNORMAL LOW (ref >60.00)
Sodium: 139 mEq/L (ref 135–145)

## 2012-12-31 LAB — VITAMIN B12: Vitamin B-12: 157 pg/mL — ABNORMAL LOW (ref 211–911)

## 2012-12-31 LAB — PSA: PSA: 2.27 ng/mL (ref 0.10–4.00)

## 2012-12-31 LAB — LDL CHOLESTEROL, DIRECT: Direct LDL: 124.4 mg/dL

## 2013-01-07 ENCOUNTER — Encounter: Payer: Self-pay | Admitting: Family Medicine

## 2013-01-07 ENCOUNTER — Ambulatory Visit (INDEPENDENT_AMBULATORY_CARE_PROVIDER_SITE_OTHER): Payer: Medicare HMO | Admitting: Family Medicine

## 2013-01-07 VITALS — BP 130/74 | HR 80 | Temp 97.7°F | Wt 274.8 lb

## 2013-01-07 DIAGNOSIS — E538 Deficiency of other specified B group vitamins: Secondary | ICD-10-CM

## 2013-01-07 DIAGNOSIS — E11319 Type 2 diabetes mellitus with unspecified diabetic retinopathy without macular edema: Secondary | ICD-10-CM

## 2013-01-07 DIAGNOSIS — L570 Actinic keratosis: Secondary | ICD-10-CM

## 2013-01-07 DIAGNOSIS — I10 Essential (primary) hypertension: Secondary | ICD-10-CM

## 2013-01-07 DIAGNOSIS — E1139 Type 2 diabetes mellitus with other diabetic ophthalmic complication: Secondary | ICD-10-CM

## 2013-01-07 DIAGNOSIS — E785 Hyperlipidemia, unspecified: Secondary | ICD-10-CM

## 2013-01-07 DIAGNOSIS — B356 Tinea cruris: Secondary | ICD-10-CM

## 2013-01-07 MED ORDER — CYANOCOBALAMIN 1000 MCG/ML IJ SOLN
1000.0000 ug | Freq: Once | INTRAMUSCULAR | Status: AC
  Administered 2013-01-07: 1000 ug via INTRAMUSCULAR

## 2013-01-07 NOTE — Patient Instructions (Signed)
Increase nuts and beans in diet to help lower cholesterol levels. Start B12 shots - monthly for 6-8 months. Blood pressure is looking good today. Return to see me in 4 months for follow up. For jock itch - may try lotrimin cream over the counter. Remind me to do treatment of scalp lesions next visit.

## 2013-01-07 NOTE — Assessment & Plan Note (Signed)
Chronic, stable. Continue med. 

## 2013-01-07 NOTE — Assessment & Plan Note (Signed)
Currently declines statin.  Continue to monitor. Discussed diet changes to lower LDL.

## 2013-01-07 NOTE — Assessment & Plan Note (Signed)
Of scalp. Will undergo treatment next visit.

## 2013-01-07 NOTE — Assessment & Plan Note (Signed)
Check IF next visit. Recommend B12 shot.  Pt agrees.

## 2013-01-07 NOTE — Addendum Note (Signed)
Addended by: Josph Macho A on: 01/07/2013 08:59 AM   Modules accepted: Orders

## 2013-01-07 NOTE — Assessment & Plan Note (Signed)
Recommended start lotrimin.

## 2013-01-07 NOTE — Progress Notes (Signed)
  Subjective:    Patient ID: Bill [name], male    DOB: 10-02-1940, 72 y.o.   MRN: [id number]  HPI CC: 4 mo f/u  HTN - compliant with meds. No HA, vision changes, CP/tightness, SOB, leg swelling.   DM - 90-100s fasting in am.  Checks every morning.  Occasional low sugars - 60s.  No hypoglycemic sxs. OJ raises value quickly. Eye exam 01/2012. Lab Results  Component Value Date   HGBA1C 7.6* 09/02/2012    HLD - has tried statin in past, did not do well.  Bad muscle pains.  B12 deficiency - did not respond to oral B12 supplementation.  Renal insufficiency - discussed.  On ibuprofen 800mg  bid.    Jock itch for years - has tried neosporin Occasional AKs on scalp.  Requests treatment for next visit.  Past Medical History  Diagnosis Date  . Hypertension   . Type 2 diabetes, controlled, with retinopathy 1980s  . Exogenous obesity   . Urge incontinence   . Lyme disease 2012    ?titers negative  . History of chicken pox   . Mild aortic stenosis 12/2010    a. Echo 12/29/10: Mild LVH, EF 50-55%, normal wall motion, mild aortic stenosis, trivial AI, mean gradient 19 mmHg, mild LAE;  b. Echo 11/13:  mod LVH, vigorous LV EF 65-70%, mod AS, mean 23 mmHg, mild MR, mild BAE, mild RVE  . Kidney stones remote  . GERD (gastroesophageal reflux disease)     occasional  . Atrial fibrillation     Past Surgical History  Procedure Laterality Date  . Kidney stone removal    . Finger sx    . US echocardiography  12/2010    EF 50-55%, mild LVH, normal wall motion, high LV filling pressures, mild AS, LA mildly dilated  . Back surgery  20 yrs ago    removal of lower back cartilage due to damage. Patient does not remember date of surgery.  . Total hip arthroplasty  03/12/2012    RIGHT TOTAL HIP ARTHROPLASTY ANTERIOR APPROACH;  Surgeon: [name], MD;   Review of Systems Per HPI    Objective:   Physical Exam  Nursing note and vitals reviewed. Constitutional: He appears well-developed and  well-nourished. No distress.  HENT:  Mouth/Throat: Oropharynx is clear and moist. No oropharyngeal exudate.  Neck: Carotid bruit is present (L bruit, radiation from AS).  Cardiovascular: Normal rate, regular rhythm and intact distal pulses.   Murmur (3/6 SEM throughout chest) heard. Pulmonary/Chest: Effort normal and breath sounds normal. No respiratory distress. He has no wheezes. He has no rales.  Musculoskeletal: He exhibits no edema.  Diabetic foot exam: Normal inspection No skin breakdown No calluses  Normal DP/PT pulses Normal sensation to light touch and monofilament Nails normal  Skin: Skin is warm and dry. No rash noted.  AKs on occipital scalp Mild erythema L>R intergroin area       Assessment & Plan:

## 2013-01-07 NOTE — Assessment & Plan Note (Signed)
Chronic.  Anticipate good control as evidenced by recall cbgs. Check A1c next visit.

## 2013-02-05 ENCOUNTER — Telehealth: Payer: Self-pay

## 2013-02-05 NOTE — Telephone Encounter (Signed)
Message left advising patient that he is to receive B12 injections monthly and take Vit D 1000 U orally daily. I advised to call me with any other questions and reminded him of his upcoming nurse visit.

## 2013-02-05 NOTE — Telephone Encounter (Signed)
Pt to receive vit B12 shots, not vit D.   I recommend continue vit D 1000 units daily

## 2013-02-05 NOTE — Telephone Encounter (Signed)
Pt request clarification; pt thought was to be getting Vit D injections, not Bit B12 injections. Pt scheduled for Vit B 12 on 02/11/13. Pt request cb.

## 2013-02-11 ENCOUNTER — Ambulatory Visit: Payer: Medicare HMO

## 2013-02-18 ENCOUNTER — Ambulatory Visit: Payer: Medicare HMO

## 2013-02-24 ENCOUNTER — Telehealth: Payer: Self-pay

## 2013-02-24 DIAGNOSIS — E11319 Type 2 diabetes mellitus with unspecified diabetic retinopathy without macular edema: Secondary | ICD-10-CM

## 2013-02-24 NOTE — Telephone Encounter (Signed)
Referral placed and routed to [facility] and [employer]

## 2013-02-24 NOTE — Telephone Encounter (Signed)
Pt is requesting referral to eye dr for yearly diabetic retinopathy check; pt said [facility] [phone] has been trying to get referral.Please advise.

## 2013-02-26 ENCOUNTER — Ambulatory Visit (INDEPENDENT_AMBULATORY_CARE_PROVIDER_SITE_OTHER): Payer: Medicare HMO

## 2013-02-26 DIAGNOSIS — Z23 Encounter for immunization: Secondary | ICD-10-CM

## 2013-02-27 NOTE — Telephone Encounter (Signed)
[employer] Referral completed thru automated phone referral line with [employer].

## 2013-02-28 ENCOUNTER — Ambulatory Visit (INDEPENDENT_AMBULATORY_CARE_PROVIDER_SITE_OTHER): Payer: Medicare HMO | Admitting: *Deleted

## 2013-02-28 ENCOUNTER — Other Ambulatory Visit: Payer: Self-pay | Admitting: *Deleted

## 2013-02-28 DIAGNOSIS — R300 Dysuria: Secondary | ICD-10-CM

## 2013-02-28 DIAGNOSIS — E538 Deficiency of other specified B group vitamins: Secondary | ICD-10-CM

## 2013-02-28 LAB — POCT URINALYSIS DIPSTICK
Nitrite, UA: POSITIVE
Protein, UA: 100
Urobilinogen, UA: 0.2
pH, UA: 6

## 2013-02-28 MED ORDER — CEPHALEXIN 500 MG PO CAPS: 500.0000 mg | ORAL_CAPSULE | Freq: Four times a day (QID) | ORAL | Status: DC

## 2013-02-28 MED ORDER — CYANOCOBALAMIN 1000 MCG/ML IJ SOLN
1000.0000 ug | Freq: Once | INTRAMUSCULAR | Status: AC
  Administered 2013-02-28: 1000 ug via INTRAMUSCULAR

## 2013-02-28 NOTE — Addendum Note (Signed)
Addended by: Eustaquio Boyden on: 02/28/2013 02:48 PM   Modules accepted: Orders

## 2013-03-03 ENCOUNTER — Ambulatory Visit (INDEPENDENT_AMBULATORY_CARE_PROVIDER_SITE_OTHER): Payer: Medicare HMO | Admitting: Family Medicine

## 2013-03-03 ENCOUNTER — Encounter: Payer: Self-pay | Admitting: Family Medicine

## 2013-03-03 VITALS — BP 134/70 | HR 84 | Temp 97.9°F | Wt 276.2 lb

## 2013-03-03 DIAGNOSIS — N390 Urinary tract infection, site not specified: Secondary | ICD-10-CM

## 2013-03-03 LAB — URINE CULTURE: Colony Count: >=100000

## 2013-03-03 NOTE — Assessment & Plan Note (Addendum)
Reviewed UCx results. Clarified to take only twice daily - and finish keflex course. Red flags to return discussed. Update if not improving as expected or sxs returning. Keep appt 04/2013.

## 2013-03-03 NOTE — Patient Instructions (Addendum)
Finish all pills of keflex, only twice daily. Let us know if not improving with this.

## 2013-03-03 NOTE — Progress Notes (Signed)
  Subjective:    Patient ID: Bill [name], male    DOB: Dec 16, 1940, 72 y.o.   MRN: [id number]  HPI CC: UTI  Under treatment for UTI after UA obtained last week returned concerning for infection.  Started on 7d keflex 500mg  bid course.  Actually was written QID which he has been taking.  Tolerating med well.  On day 3. H/o kidney stones, diabetes, hypertension, urge incontinence. UCx - >100k pansensitive Ecoli.  Sxs started as urinary burning and cloudy urine.  No frequency, urgency.  No fevers, blood in urine, flank pain, nausea, abd pain.  Lab Results  Component Value Date   HGBA1C 7.6* 09/02/2012  sugar occasionally drops to 60s, a few times a month.  No hypoglycemic sxs even with this.  Past Medical History  Diagnosis Date  . Hypertension   . Type 2 diabetes, controlled, with retinopathy 1980s  . Exogenous obesity   . Urge incontinence   . Lyme disease 2012    ?titers negative  . History of chicken pox   . Mild aortic stenosis 12/2010    a. Echo 12/29/10: Mild LVH, EF 50-55%, normal wall motion, mild aortic stenosis, trivial AI, mean gradient 19 mmHg, mild LAE;  b. Echo 11/13:  mod LVH, vigorous LV EF 65-70%, mod AS, mean 23 mmHg, mild MR, mild BAE, mild RVE  . Kidney stones remote  . GERD (gastroesophageal reflux disease)     occasional  . Atrial fibrillation     Review of Systems Per HPI    Objective:   Physical Exam  Nursing note and vitals reviewed. Constitutional: He appears well-developed and well-nourished. No distress.  Abdominal: Soft. Bowel sounds are normal. He exhibits no distension and no mass. There is no hepatosplenomegaly. There is no tenderness. There is no rigidity, no rebound, no guarding, no CVA tenderness and negative Murphy's sign.  obese  Musculoskeletal: He exhibits no edema.       Assessment & Plan:

## 2013-03-07 ENCOUNTER — Encounter: Payer: Self-pay | Admitting: Family Medicine

## 2013-03-18 ENCOUNTER — Ambulatory Visit: Payer: Medicare HMO

## 2013-03-19 ENCOUNTER — Other Ambulatory Visit: Payer: Self-pay | Admitting: Family Medicine

## 2013-03-27 ENCOUNTER — Other Ambulatory Visit: Payer: Self-pay

## 2013-04-03 ENCOUNTER — Ambulatory Visit (INDEPENDENT_AMBULATORY_CARE_PROVIDER_SITE_OTHER): Payer: Medicare HMO

## 2013-04-03 DIAGNOSIS — E538 Deficiency of other specified B group vitamins: Secondary | ICD-10-CM

## 2013-04-03 MED ORDER — CYANOCOBALAMIN 1000 MCG/ML IJ SOLN
1000.0000 ug | Freq: Once | INTRAMUSCULAR | Status: AC
  Administered 2013-04-03: 1000 ug via INTRAMUSCULAR

## 2013-04-15 ENCOUNTER — Ambulatory Visit: Payer: Medicare HMO

## 2013-05-09 ENCOUNTER — Encounter: Payer: Self-pay | Admitting: Family Medicine

## 2013-05-09 ENCOUNTER — Ambulatory Visit (INDEPENDENT_AMBULATORY_CARE_PROVIDER_SITE_OTHER): Payer: Medicare HMO | Admitting: Family Medicine

## 2013-05-09 VITALS — BP 128/72 | HR 76 | Temp 98.2°F | Wt 279.2 lb

## 2013-05-09 DIAGNOSIS — M7750 Other enthesopathy of unspecified foot: Secondary | ICD-10-CM

## 2013-05-09 DIAGNOSIS — E538 Deficiency of other specified B group vitamins: Secondary | ICD-10-CM

## 2013-05-09 DIAGNOSIS — H6192 Disorder of left external ear, unspecified: Secondary | ICD-10-CM

## 2013-05-09 DIAGNOSIS — E1139 Type 2 diabetes mellitus with other diabetic ophthalmic complication: Secondary | ICD-10-CM

## 2013-05-09 DIAGNOSIS — E785 Hyperlipidemia, unspecified: Secondary | ICD-10-CM

## 2013-05-09 DIAGNOSIS — E11319 Type 2 diabetes mellitus with unspecified diabetic retinopathy without macular edema: Secondary | ICD-10-CM

## 2013-05-09 DIAGNOSIS — M7741 Metatarsalgia, right foot: Secondary | ICD-10-CM | POA: Insufficient documentation

## 2013-05-09 DIAGNOSIS — I10 Essential (primary) hypertension: Secondary | ICD-10-CM

## 2013-05-09 DIAGNOSIS — H6190 Disorder of external ear, unspecified, unspecified ear: Secondary | ICD-10-CM

## 2013-05-09 DIAGNOSIS — E559 Vitamin D deficiency, unspecified: Secondary | ICD-10-CM

## 2013-05-09 LAB — HEMOGLOBIN A1C: Hgb A1c MFr Bld: 6.6 % — ABNORMAL HIGH (ref 4.6–6.5)

## 2013-05-09 LAB — BASIC METABOLIC PANEL
BUN: 39 mg/dL — ABNORMAL HIGH (ref 6–23)
CO2: 25 mEq/L (ref 19–32)
Calcium: 9.9 mg/dL (ref 8.4–10.5)
Creatinine, Ser: 1.7 mg/dL — ABNORMAL HIGH (ref 0.4–1.5)
Sodium: 137 mEq/L (ref 135–145)

## 2013-05-09 LAB — VITAMIN B12: Vitamin B-12: 289 pg/mL (ref 211–911)

## 2013-05-09 MED ORDER — CYANOCOBALAMIN 1000 MCG/ML IJ SOLN
1000.0000 ug | Freq: Once | INTRAMUSCULAR | Status: AC
  Administered 2013-05-09: 1000 ug via INTRAMUSCULAR

## 2013-05-09 NOTE — Assessment & Plan Note (Signed)
Check A1c today.  Anticipate good control based on recall CBGs.

## 2013-05-09 NOTE — Assessment & Plan Note (Signed)
Has not improved.  Present for >1 Yr. Will refer to derm.  Pt agrees.

## 2013-05-09 NOTE — Progress Notes (Signed)
Pre-visit discussion using our clinic review tool. No additional management support is needed unless otherwise documented below in the visit note.  

## 2013-05-09 NOTE — Addendum Note (Signed)
Addended by: Josph Macho A on: 05/09/2013 09:57 AM   Modules accepted: Orders

## 2013-05-09 NOTE — Assessment & Plan Note (Signed)
He is currently on vit D oral.  Check levels today. If persistently low, start 86578 u weekly.

## 2013-05-09 NOTE — Assessment & Plan Note (Signed)
Chronic, stable. Continue lisinopril.  Check Cr today.

## 2013-05-09 NOTE — Assessment & Plan Note (Signed)
Discussed elevated LDL.  Pt resistant to all statins.  Will discuss RYR next visit.

## 2013-05-09 NOTE — Assessment & Plan Note (Signed)
With early callus formation. Evidence of metatarsal collapse and loss of transverse arch. Placed in medium right metatarsal pad.

## 2013-05-09 NOTE — Progress Notes (Signed)
   Subjective:    Patient ID: Bill [name], male    DOB: 1940/07/07, 72 y.o.   MRN: [id number]  HPI CC: 4 mo f/u  DM - eye exam 02/2013.  Fasting sugar 108.  Highs 140 fasting.  Checks fasting every morning.  No low sugars or hypoglycemic sxs.  No paresthesias.   Lab Results  Component Value Date   HGBA1C 7.6* 09/02/2012    HTN - No HA, vision changes, CP/tightness, SOB, leg swelling. Complaint with lisinopril 20mg  daily.  HLD - statins caused myalgias in the past.  Unsure which he tried.    Vit D def - compliant with 1000 IU daily Vit B12 def - compliant with 1000mg  shots monthly as well as oral 1000mg  daily  Flu shot 02/2013.  Past Medical History  Diagnosis Date  . Hypertension   . Type 2 diabetes, controlled, with retinopathy 1980s  . Exogenous obesity   . Urge incontinence   . Lyme disease 2012    ?titers negative  . History of chicken pox   . Mild aortic stenosis 12/2010    a. Echo 12/29/10: Mild LVH, EF 50-55%, normal wall motion, mild aortic stenosis, trivial AI, mean gradient 19 mmHg, mild LAE;  b. Echo 11/13:  mod LVH, vigorous LV EF 65-70%, mod AS, mean 23 mmHg, mild MR, mild BAE, mild RVE  . Kidney stones remote  . GERD (gastroesophageal reflux disease)     occasional  . Atrial fibrillation      Review of Systems Per HPI    Objective:   Physical Exam  Nursing note and vitals reviewed. Constitutional: He appears well-developed and well-nourished. No distress.  HENT:  Head: Normocephalic and atraumatic.  Right Ear: External ear normal.  Left Ear: External ear normal.  Nose: Nose normal.  Mouth/Throat: Oropharynx is clear and moist. No oropharyngeal exudate.  Eyes: Conjunctivae and EOM are normal. Pupils are equal, round, and reactive to light. No scleral icterus.  Neck: Normal range of motion. Neck supple.  Cardiovascular: Normal rate, regular rhythm, normal heart sounds and intact distal pulses.   No murmur heard. Pulmonary/Chest: Effort normal and  breath sounds normal. No respiratory distress. He has no wheezes. He has no rales.  Musculoskeletal: He exhibits no edema.  Diabetic foot exam: Normal inspection No skin breakdown Early callus formation right sole Normal DP/PT pulses Normal sensation to light touch and monofilament Nails normal Collapse of R transverse arch  Lymphadenopathy:    He has no cervical adenopathy.  Skin: Skin is warm and dry. No rash noted.  Poorly healing wound on outer L pinna  Psychiatric: He has a normal mood and affect.       Assessment & Plan:

## 2013-05-09 NOTE — Assessment & Plan Note (Signed)
Check blood levels then B12 shot today.

## 2013-05-09 NOTE — Patient Instructions (Signed)
Blood work today then a B12 shot. I do think you have loss of your transverse arch on right foot - I have placed you in a metatarsal pad on right shoe.  If this doesn't help, I recommend returning to orthopedist to check right hip. Return in 4 months for wellness exam. Good to see you today, call us with questions.

## 2013-05-10 LAB — VITAMIN D 25 HYDROXY (VIT D DEFICIENCY, FRACTURES): Vit D, 25-Hydroxy: 31 ng/mL (ref 30–89)

## 2013-05-15 ENCOUNTER — Other Ambulatory Visit: Payer: Self-pay | Admitting: Family Medicine

## 2013-05-15 DIAGNOSIS — N189 Chronic kidney disease, unspecified: Secondary | ICD-10-CM

## 2013-05-15 DIAGNOSIS — E1122 Type 2 diabetes mellitus with diabetic chronic kidney disease: Secondary | ICD-10-CM | POA: Insufficient documentation

## 2013-05-22 HISTORY — PX: CATARACT EXTRACTION: SUR2

## 2013-05-28 ENCOUNTER — Other Ambulatory Visit (INDEPENDENT_AMBULATORY_CARE_PROVIDER_SITE_OTHER): Payer: Medicare HMO

## 2013-05-28 DIAGNOSIS — N189 Chronic kidney disease, unspecified: Secondary | ICD-10-CM

## 2013-05-28 LAB — RENAL FUNCTION PANEL
Albumin: 4.2 g/dL (ref 3.5–5.2)
BUN: 25 mg/dL — AB (ref 6–23)
CHLORIDE: 105 meq/L (ref 96–112)
CO2: 26 mEq/L (ref 19–32)
Calcium: 10.1 mg/dL (ref 8.4–10.5)
Creatinine, Ser: 1.3 mg/dL (ref 0.4–1.5)
GFR: 55.3 mL/min — ABNORMAL LOW (ref >60.00)
Glucose, Bld: 150 mg/dL — ABNORMAL HIGH (ref 70–99)
POTASSIUM: 4.9 meq/L (ref 3.5–5.1)
Phosphorus: 3.4 mg/dL (ref 2.3–4.6)
Sodium: 139 mEq/L (ref 135–145)

## 2013-05-28 LAB — CBC WITH DIFFERENTIAL/PLATELET
BASOS ABS: 0 10*3/uL (ref 0.0–0.1)
Basophils Relative: 0.1 % (ref 0.0–3.0)
Eosinophils Absolute: 0.2 10*3/uL (ref 0.0–0.7)
Eosinophils Relative: 3.8 % (ref 0.0–5.0)
HCT: 37.1 % — ABNORMAL LOW (ref 39.0–52.0)
Hemoglobin: 12.2 g/dL — ABNORMAL LOW (ref 13.0–17.0)
LYMPHS PCT: 15.9 % (ref 12.0–46.0)
Lymphs Abs: 0.9 10*3/uL (ref 0.7–4.0)
MCHC: 33 g/dL (ref 30.0–36.0)
MCV: 101 fl — ABNORMAL HIGH (ref 78.0–100.0)
MONOS PCT: 9.5 % (ref 3.0–12.0)
Monocytes Absolute: 0.5 10*3/uL (ref 0.1–1.0)
Neutro Abs: 3.9 10*3/uL (ref 1.4–7.7)
Neutrophils Relative %: 70.7 % (ref 43.0–77.0)
PLATELETS: 166 10*3/uL (ref 150.0–400.0)
RBC: 3.68 Mil/uL — ABNORMAL LOW (ref 4.22–5.81)
RDW: 13.4 % (ref 11.5–14.6)
WBC: 5.5 10*3/uL (ref 4.5–10.5)

## 2013-05-28 LAB — MICROALBUMIN / CREATININE URINE RATIO
Creatinine,U: 93.3 mg/dL
Microalb Creat Ratio: 6.5 mg/g (ref 0.0–30.0)
Microalb, Ur: 6.1 mg/dL — ABNORMAL HIGH (ref 0.0–1.9)

## 2013-06-03 LAB — PROTEIN ELECTROPHORESIS, SERUM
Albumin ELP: 58.7 % (ref 55.8–66.1)
Alpha-1-Globulin: 4.2 % (ref 2.9–4.9)
Alpha-2-Globulin: 11.5 % (ref 7.1–11.8)
Beta 2: 4.7 % (ref 3.2–6.5)
Beta Globulin: 7.3 % — ABNORMAL HIGH (ref 4.7–7.2)
Gamma Globulin: 13.6 % (ref 11.1–18.8)
Total Protein, Serum Electrophoresis: 6.9 g/dL (ref 6.0–8.3)

## 2013-06-10 ENCOUNTER — Telehealth: Payer: Self-pay

## 2013-06-10 NOTE — Telephone Encounter (Signed)
Pt left v/m; pt wants to know if he is to continue getting Vit B 12 shots; pt got B 12 injection at 05/09/13 visit.Please advise.

## 2013-06-11 NOTE — Telephone Encounter (Signed)
Patient notified and nurse visit scheduled.

## 2013-06-11 NOTE — Telephone Encounter (Signed)
Started august 2014.  I recommend 8 months to 1 year of treatment with shots then transition to oral B12. Recommend continued B12 shots until at least 08/2013.

## 2013-06-12 ENCOUNTER — Ambulatory Visit (INDEPENDENT_AMBULATORY_CARE_PROVIDER_SITE_OTHER): Payer: Medicare HMO | Admitting: *Deleted

## 2013-06-12 DIAGNOSIS — E538 Deficiency of other specified B group vitamins: Secondary | ICD-10-CM

## 2013-06-12 MED ORDER — CYANOCOBALAMIN 1000 MCG/ML IJ SOLN
1000.0000 ug | Freq: Once | INTRAMUSCULAR | Status: AC
  Administered 2013-06-12: 1000 ug via INTRAMUSCULAR

## 2013-07-10 ENCOUNTER — Telehealth: Payer: Self-pay | Admitting: *Deleted

## 2013-07-10 NOTE — Telephone Encounter (Signed)
Clinical alert from [employer] in your IN box for review. 

## 2013-07-11 NOTE — Telephone Encounter (Signed)
Lab Results  Component Value Date   LDLCALC 119* 09/02/2012  will recheck at next office visit and consider statin.

## 2013-07-20 LAB — HM DIABETES EYE EXAM

## 2013-08-11 ENCOUNTER — Other Ambulatory Visit: Payer: Self-pay | Admitting: Family Medicine

## 2013-08-23 ENCOUNTER — Other Ambulatory Visit: Payer: Self-pay | Admitting: Family Medicine

## 2013-08-23 ENCOUNTER — Encounter: Payer: Self-pay | Admitting: Family Medicine

## 2013-08-23 DIAGNOSIS — E11319 Type 2 diabetes mellitus with unspecified diabetic retinopathy without macular edema: Secondary | ICD-10-CM

## 2013-08-23 DIAGNOSIS — N189 Chronic kidney disease, unspecified: Secondary | ICD-10-CM

## 2013-08-23 DIAGNOSIS — E785 Hyperlipidemia, unspecified: Secondary | ICD-10-CM

## 2013-08-23 DIAGNOSIS — I10 Essential (primary) hypertension: Secondary | ICD-10-CM

## 2013-08-23 DIAGNOSIS — E538 Deficiency of other specified B group vitamins: Secondary | ICD-10-CM

## 2013-08-23 DIAGNOSIS — Z87898 Personal history of other specified conditions: Secondary | ICD-10-CM

## 2013-08-25 ENCOUNTER — Encounter: Payer: Self-pay | Admitting: Family Medicine

## 2013-08-25 ENCOUNTER — Ambulatory Visit (INDEPENDENT_AMBULATORY_CARE_PROVIDER_SITE_OTHER): Payer: Commercial Managed Care - HMO | Admitting: Family Medicine

## 2013-08-25 VITALS — BP 130/80 | HR 74 | Temp 98.0°F | Wt 274.2 lb

## 2013-08-25 DIAGNOSIS — J209 Acute bronchitis, unspecified: Secondary | ICD-10-CM | POA: Insufficient documentation

## 2013-08-25 MED ORDER — AZITHROMYCIN 250 MG PO TABS: ORAL_TABLET | ORAL | Status: DC

## 2013-08-25 NOTE — Progress Notes (Signed)
BP 130/80  Pulse 74  Temp(Src) 98 F (36.7 C) (Oral)  Wt 274 lb 4 oz (124.399 kg)  SpO2 97%   CC: cough  Subjective:    Patient ID: Bill [name], male    DOB: 1941-02-17, 73 y.o.   MRN: [id number]  HPI: Bill [name] is a 73 y.o. male presenting on 08/25/2013 for Nasal Congestion   Cough - 10 d h/o cough and congestion.  Productive cough of thick clear mucous.  Some pressure behind eyes.  + PNDrainge.  head and chest congestion.  + PNdrainage.  No fevers/chills, abd pain, nausea, ear or tooth pain.  No dyspnea or wheezing.  Has tried corcedin.   Bill [name] sick recently.  No smokers at home. Did receive flu shot this year.  Relevant past medical, surgical, family and social history reviewed and updated as indicated.  Allergies and medications reviewed and updated. Current Outpatient Prescriptions on File Prior to Visit  Medication Sig  . aspirin EC 81 MG tablet Take 81 mg by mouth daily.  . cholecalciferol (VITAMIN D) 1000 UNITS tablet Take 1,000 Units by mouth daily.  . cyanocobalamin (,VITAMIN B-12,) 1000 MCG/ML injection Inject 1,000 mcg into the muscle once. Started 01/06/2013  . fish oil-omega-3 fatty acids 1000 MG capsule Take 1 g by mouth daily.  . furosemide (LASIX) 20 MG tablet Take 1 tablet (20 mg total) by mouth daily.  Marland Kitchen glipiZIDE (GLUCOTROL) 10 MG tablet Take 1 tablet (10 mg total) by mouth daily.  Marland Kitchen ibuprofen (ADVIL,MOTRIN) 800 MG tablet Take 1 tablet (800 mg total) by mouth 2 (two) times daily as needed for pain.  Elmore Guise Devices (SOFT TOUCH LANCET DEVICE) MISC 100 strips by Does not apply route 2 (two) times daily.  . metFORMIN (GLUCOPHAGE) 1000 MG tablet TAKE 1 TABLET TWICE DAILY WITH MEALS  . Multiple Vitamins-Minerals (MULTIVITAMIN PO) Take 5 tablets by mouth daily. Multivitamin packet containing 5 tablets.  Marland Kitchen oxybutynin (DITROPAN) 5 MG tablet Take 5 mg by mouth 3 (three) times daily. Once daily and then prn  . TRUETEST TEST test strip TEST TWO TIMES DAILY  AND AS  NEEDED  . lisinopril (PRINIVIL,ZESTRIL) 20 MG tablet Take 1 tablet (20 mg total) by mouth daily.  . [DISCONTINUED] oxybutynin (DITROPAN XL) 15 MG 24 hr tablet Take 1 tablet (15 mg total) by mouth daily.   No current facility-administered medications on file prior to visit.    Review of Systems Per HPI unless specifically indicated above    Objective:    BP 130/80  Pulse 74  Temp(Src) 98 F (36.7 C) (Oral)  Wt 274 lb 4 oz (124.399 kg)  SpO2 97%  Physical Exam  Nursing note and vitals reviewed. Constitutional: He appears well-developed and well-nourished. No distress.  HENT:  Head: Normocephalic and atraumatic.  Right Ear: Hearing, tympanic membrane, external ear and ear canal normal.  Left Ear: Hearing, tympanic membrane, external ear and ear canal normal.  Nose: Nose normal. No mucosal edema or rhinorrhea. Right sinus exhibits no maxillary sinus tenderness and no frontal sinus tenderness. Left sinus exhibits no maxillary sinus tenderness and no frontal sinus tenderness.  Mouth/Throat: Uvula is midline, oropharynx is clear and moist and mucous membranes are normal. No oropharyngeal exudate, posterior oropharyngeal edema, posterior oropharyngeal erythema or tonsillar abscesses.  Eyes: Conjunctivae and EOM are normal. Pupils are equal, round, and reactive to light. No scleral icterus.  Neck: Normal range of motion. Neck supple.  Cardiovascular: Normal rate, regular rhythm, normal heart sounds and intact distal  pulses.   No murmur heard. Pulmonary/Chest: Effort normal. No respiratory distress. He has no decreased breath sounds. He has no wheezes. He has rhonchi in the left lower field. He has no rales.  Lymphadenopathy:    He has no cervical adenopathy.  Skin: Skin is warm and dry. No rash noted.       Assessment & Plan:   Problem List Items Addressed This Visit   Acute bronchitis - Primary     With L lower lobe rhonchi, cover for atypical bacterial infection with  zpack. Supportive care as per instructions. Pt declines cough syrup today. Red flags to return discussed.        Follow up plan: Return if symptoms worsen or fail to improve.

## 2013-08-25 NOTE — Patient Instructions (Signed)
I think you have bronchitis, but also possibly allergy flare.  Treat with zpack antibiotic sent into pharmacy and may try claritin and nasal saline irrigation daily for congestion. Let me know if fever >101, worsening productive cough, or not improving as expected.

## 2013-08-25 NOTE — Progress Notes (Signed)
Pre visit review using our clinic review tool, if applicable. No additional management support is needed unless otherwise documented below in the visit note. 

## 2013-08-25 NOTE — Assessment & Plan Note (Signed)
With L lower lobe rhonchi, cover for atypical bacterial infection with zpack. Supportive care as per instructions. Pt declines cough syrup today. Red flags to return discussed.

## 2013-08-29 ENCOUNTER — Telehealth: Payer: Self-pay

## 2013-08-29 MED ORDER — AMOXICILLIN 875 MG PO TABS: 875.0000 mg | ORAL_TABLET | Freq: Two times a day (BID) | ORAL | Status: DC

## 2013-08-29 NOTE — Telephone Encounter (Signed)
Pt left v/m pt was seen 08/25/13 and pt finished z pack but still frequent prod cough with clear phlegm, no SOB or CP or fever. Pt has a lot of head congestion and drainage at back of throat; No S/T. Pt request different antibiotic to [facility].

## 2013-08-29 NOTE — Telephone Encounter (Signed)
Spoke with patient. Feeling weak. Persistent cough and PNdrainage and throat congestion. Will treat with amox course. update if not improving with treatment.

## 2013-09-01 ENCOUNTER — Other Ambulatory Visit (INDEPENDENT_AMBULATORY_CARE_PROVIDER_SITE_OTHER): Payer: Commercial Managed Care - HMO

## 2013-09-01 DIAGNOSIS — E11319 Type 2 diabetes mellitus with unspecified diabetic retinopathy without macular edema: Secondary | ICD-10-CM

## 2013-09-01 DIAGNOSIS — E538 Deficiency of other specified B group vitamins: Secondary | ICD-10-CM

## 2013-09-01 DIAGNOSIS — E1139 Type 2 diabetes mellitus with other diabetic ophthalmic complication: Secondary | ICD-10-CM

## 2013-09-01 DIAGNOSIS — I10 Essential (primary) hypertension: Secondary | ICD-10-CM

## 2013-09-01 DIAGNOSIS — N189 Chronic kidney disease, unspecified: Secondary | ICD-10-CM

## 2013-09-01 DIAGNOSIS — E559 Vitamin D deficiency, unspecified: Secondary | ICD-10-CM

## 2013-09-01 DIAGNOSIS — R972 Elevated prostate specific antigen [PSA]: Secondary | ICD-10-CM

## 2013-09-01 DIAGNOSIS — E785 Hyperlipidemia, unspecified: Secondary | ICD-10-CM

## 2013-09-01 DIAGNOSIS — Z87898 Personal history of other specified conditions: Secondary | ICD-10-CM

## 2013-09-01 LAB — LIPID PANEL
Cholesterol: 153 mg/dL (ref 0–200)
HDL: 57.6 mg/dL (ref >39.00)
LDL CALC: 87 mg/dL (ref 0–99)
Total CHOL/HDL Ratio: 3
Triglycerides: 44 mg/dL (ref 0.0–149.0)
VLDL: 8.8 mg/dL (ref 0.0–40.0)

## 2013-09-01 LAB — CBC WITH DIFFERENTIAL/PLATELET
Basophils Absolute: 0 10*3/uL (ref 0.0–0.1)
Basophils Relative: 0.2 % (ref 0.0–3.0)
Eosinophils Absolute: 0.2 10*3/uL (ref 0.0–0.7)
Eosinophils Relative: 1.9 % (ref 0.0–5.0)
HCT: 40.6 % (ref 39.0–52.0)
Hemoglobin: 13.6 g/dL (ref 13.0–17.0)
Lymphocytes Relative: 12 % (ref 12.0–46.0)
Lymphs Abs: 1 10*3/uL (ref 0.7–4.0)
MCHC: 33.6 g/dL (ref 30.0–36.0)
MCV: 100.1 fl — ABNORMAL HIGH (ref 78.0–100.0)
MONOS PCT: 11.6 % (ref 3.0–12.0)
Monocytes Absolute: 1 10*3/uL (ref 0.1–1.0)
NEUTROS PCT: 74.3 % (ref 43.0–77.0)
Neutro Abs: 6.1 10*3/uL (ref 1.4–7.7)
PLATELETS: 215 10*3/uL (ref 150.0–400.0)
RBC: 4.05 Mil/uL — ABNORMAL LOW (ref 4.22–5.81)
RDW: 13.2 % (ref 11.5–14.6)
WBC: 8.3 10*3/uL (ref 4.5–10.5)

## 2013-09-01 LAB — HEMOGLOBIN A1C: HEMOGLOBIN A1C: 6.5 % (ref 4.6–6.5)

## 2013-09-01 LAB — RENAL FUNCTION PANEL
Albumin: 3.8 g/dL (ref 3.5–5.2)
BUN: 20 mg/dL (ref 6–23)
CALCIUM: 9.8 mg/dL (ref 8.4–10.5)
CO2: 25 mEq/L (ref 19–32)
CREATININE: 1.3 mg/dL (ref 0.4–1.5)
Chloride: 98 mEq/L (ref 96–112)
GFR: 58.26 mL/min — ABNORMAL LOW (ref >60.00)
Glucose, Bld: 113 mg/dL — ABNORMAL HIGH (ref 70–99)
Phosphorus: 3.2 mg/dL (ref 2.3–4.6)
Potassium: 5.5 mEq/L — ABNORMAL HIGH (ref 3.5–5.1)
Sodium: 135 mEq/L (ref 135–145)

## 2013-09-01 LAB — VITAMIN B12: VITAMIN B 12: 622 pg/mL (ref 211–911)

## 2013-09-01 LAB — PSA: PSA: 4.13 ng/mL — AB (ref 0.10–4.00)

## 2013-09-01 LAB — TSH: TSH: 2.69 u[IU]/mL (ref 0.35–5.50)

## 2013-09-03 ENCOUNTER — Ambulatory Visit (INDEPENDENT_AMBULATORY_CARE_PROVIDER_SITE_OTHER): Payer: Commercial Managed Care - HMO | Admitting: Family Medicine

## 2013-09-03 ENCOUNTER — Telehealth: Payer: Self-pay | Admitting: Family Medicine

## 2013-09-03 ENCOUNTER — Encounter: Payer: Self-pay | Admitting: Family Medicine

## 2013-09-03 VITALS — BP 132/82 | HR 76 | Temp 97.9°F | Ht 73.0 in | Wt 282.8 lb

## 2013-09-03 DIAGNOSIS — I350 Nonrheumatic aortic (valve) stenosis: Secondary | ICD-10-CM

## 2013-09-03 DIAGNOSIS — E538 Deficiency of other specified B group vitamins: Secondary | ICD-10-CM

## 2013-09-03 DIAGNOSIS — E1139 Type 2 diabetes mellitus with other diabetic ophthalmic complication: Secondary | ICD-10-CM

## 2013-09-03 DIAGNOSIS — E875 Hyperkalemia: Secondary | ICD-10-CM

## 2013-09-03 DIAGNOSIS — N189 Chronic kidney disease, unspecified: Secondary | ICD-10-CM

## 2013-09-03 DIAGNOSIS — E785 Hyperlipidemia, unspecified: Secondary | ICD-10-CM

## 2013-09-03 DIAGNOSIS — Z0000 Encounter for general adult medical examination without abnormal findings: Secondary | ICD-10-CM

## 2013-09-03 DIAGNOSIS — E11319 Type 2 diabetes mellitus with unspecified diabetic retinopathy without macular edema: Secondary | ICD-10-CM

## 2013-09-03 DIAGNOSIS — J209 Acute bronchitis, unspecified: Secondary | ICD-10-CM

## 2013-09-03 DIAGNOSIS — I359 Nonrheumatic aortic valve disorder, unspecified: Secondary | ICD-10-CM

## 2013-09-03 DIAGNOSIS — Z1211 Encounter for screening for malignant neoplasm of colon: Secondary | ICD-10-CM

## 2013-09-03 DIAGNOSIS — I10 Essential (primary) hypertension: Secondary | ICD-10-CM

## 2013-09-03 DIAGNOSIS — Z23 Encounter for immunization: Secondary | ICD-10-CM

## 2013-09-03 DIAGNOSIS — R972 Elevated prostate specific antigen [PSA]: Secondary | ICD-10-CM

## 2013-09-03 NOTE — Assessment & Plan Note (Signed)
Chronic, stable. Continue with diet control.  Pt resistant to statins.

## 2013-09-03 NOTE — Progress Notes (Signed)
Pre visit review using our clinic review tool, if applicable. No additional management support is needed unless otherwise documented below in the visit note. 

## 2013-09-03 NOTE — Addendum Note (Signed)
Addended by: Royann Shivers A on: 09/03/2013 11:58 AM   Modules accepted: Orders

## 2013-09-03 NOTE — Assessment & Plan Note (Signed)
Chronic, stable. Goal for him <130/80.  Stop lisinopril and all ACEI 2/2 h/o hyperkalemia, continue lasix alone.  Cr stable. Recheck K in 1-2 mo when returns for PSA.

## 2013-09-03 NOTE — Assessment & Plan Note (Signed)
Last year with isolated elevation to 4s, on recheck dropped to 2.2.  This year again elevated to 4 - very low grade elevation and DRE reassuring today with mild BPH. Discussed options.  Recommend recheck in next 1-2 months for closer monitoring of PSA. No fmhx prostate cancer  Lab Results  Component Value Date   PSA 4.13* 09/01/2013   PSA 2.27 12/31/2012   PSA 4.24* 09/02/2012

## 2013-09-03 NOTE — Assessment & Plan Note (Signed)
I have personally reviewed the Medicare Annual Wellness questionnaire and have noted 1. The patient's medical and social history 2. Their use of alcohol, tobacco or illicit drugs 3. Their current medications and supplements 4. The patient's functional ability including ADL's, fall risks, home safety risks and hearing or visual impairment. 5. Diet and physical activity 6. Evidence for depression or mood disorders The patients weight, height, BMI have been recorded in the chart.  Hearing and vision has been addressed. I have made referrals, counseling and provided education to the patient based review of the above and I have provided the pt with a written personalized care plan for preventive services. See scanned questionairre. Advanced directives discussed: pt will bring me copy of advanced directives - reminded him again  Reviewed preventative protocols and updated unless pt declined. iFOB today prevnar today.

## 2013-09-03 NOTE — Assessment & Plan Note (Signed)
Borderline stage 3 - reviewed with patient as well as importance of good hydration status and avoiding NSAIDs.

## 2013-09-03 NOTE — Assessment & Plan Note (Signed)
Has not received B12 shot recently.  Recent check WNL.  Will stop shots and start B12 1000 mcg daily. Discussed importance of continued oral supplementation daily.

## 2013-09-03 NOTE — Telephone Encounter (Signed)
Relevant patient education assigned to patient using Emmi. ° °

## 2013-09-03 NOTE — Patient Instructions (Addendum)
Pass by lab for a stool kit prevnar today (2nd and final pneumonia shot). Return in 1-2 months for nurse visit for shingles shot. Prostate returned slightly elevated - for this, return in 1-2 months to recheck level along with potassium. Bring me copy of living will at home. Let's stop lisinopril/enalapril. We will call you to schedule ultrasound to follow aortic stenosis. Good to see you today, call us with questions.

## 2013-09-03 NOTE — Assessment & Plan Note (Signed)
Chronic, stable.  A1c 6.5%.  Continue metformin and glipizide. UTD eye exam.  Check feet next visit.

## 2013-09-03 NOTE — Progress Notes (Signed)
BP 132/82  Pulse 76  Temp(Src) 97.9 F (36.6 C) (Oral)  Ht 6\' 1"  (1.854 m)  Wt 282 lb 12 oz (128.255 kg)  BMI 37.31 kg/m2   CC: medicare wellness  Subjective:    Patient ID: Bill [name], male    DOB: April 23, 1941, 73 y.o.   MRN: [id number]  HPI: Bill [name] is a 73 y.o. male presenting on 09/03/2013 for Annual Exam   Recent bronchitis treated with zpack then amox course.  Hyperkalemia - longstanding.  Off ACEI completely for last few months.  Hearing screen passed today [facility] - eye doctor appt last month.  Found to have L cataract pending evaluation. Denies depression/sadness/anhedonia or recent falls.  Preventative: Colonoscopy 2002, told normal, rpt 10 yrs. Would like yearly stool kits. H/o hemorrhoids.  Prostate screening - always normal. Denies nocturia, hesitancy or changes in stream. Elevated PSA this year.  One elevated reading last year that returned to normal after a few months.  Denies prostatitis sxs Flu shot 02/2013 Pneumonia shot 2007.  prevnar today. Tetanus shot - Td 2013  zostavax - insurance will cover. Advanced directives: has living will at home. Does not want prolonged life support "no extraordinary measures". Would want wife to be HCPOA.   Caffeine: coffee 1 cup/day Lives with wife Bill [name]), 1 cat Occupation: retired, worked for [employer] Edu: BS Activity: no regular exercise Diet: some water, fruits/vegetables daily  Relevant past medical, surgical, family and social history reviewed and updated as indicated.  Allergies and medications reviewed and updated. Current Outpatient Prescriptions on File Prior to Visit  Medication Sig  . amoxicillin (AMOXIL) 875 MG tablet Take 1 tablet (875 mg total) by mouth 2 (two) times daily.  Marland Kitchen aspirin EC 81 MG tablet Take 81 mg by mouth daily.  . cholecalciferol (VITAMIN D) 1000 UNITS tablet Take 1,000 Units by mouth daily.  . fish oil-omega-3 fatty acids 1000 MG capsule Take 1 g by mouth daily.  . furosemide  (LASIX) 20 MG tablet Take 1 tablet (20 mg total) by mouth daily.  Marland Kitchen glipiZIDE (GLUCOTROL) 10 MG tablet Take 1 tablet (10 mg total) by mouth daily.  Marland Kitchen ibuprofen (ADVIL,MOTRIN) 800 MG tablet Take 1 tablet (800 mg total) by mouth 2 (two) times daily as needed for pain.  Elmore Guise Devices (SOFT TOUCH LANCET DEVICE) MISC 100 strips by Does not apply route 2 (two) times daily.  . metFORMIN (GLUCOPHAGE) 1000 MG tablet TAKE 1 TABLET TWICE DAILY WITH MEALS  . Multiple Vitamins-Minerals (MULTIVITAMIN PO) Take 5 tablets by mouth daily. Multivitamin packet containing 5 tablets.  Marland Kitchen oxybutynin (DITROPAN) 5 MG tablet Take 5 mg by mouth 3 (three) times daily. Once daily and then prn  . TRUETEST TEST test strip TEST TWO TIMES DAILY  AND AS NEEDED  . Dextromethorphan-Guaifenesin (CORICIDIN HBP CONGESTION/COUGH PO) Take by mouth as needed.  . [DISCONTINUED] oxybutynin (DITROPAN XL) 15 MG 24 hr tablet Take 1 tablet (15 mg total) by mouth daily.   No current facility-administered medications on file prior to visit.    Review of Systems Per HPI unless specifically indicated above    Objective:    BP 132/82  Pulse 76  Temp(Src) 97.9 F (36.6 C) (Oral)  Ht 6\' 1"  (1.854 m)  Wt 282 lb 12 oz (128.255 kg)  BMI 37.31 kg/m2  Physical Exam  Nursing note and vitals reviewed. Constitutional: He is oriented to person, place, and time. He appears well-developed and well-nourished. No distress.  HENT:  Head: Normocephalic and atraumatic.  Right Ear: Hearing, tympanic membrane, external ear and ear canal normal.  Left Ear: Hearing, tympanic membrane, external ear and ear canal normal.  Nose: Nose normal.  Mouth/Throat: Uvula is midline, oropharynx is clear and moist and mucous membranes are normal. No oropharyngeal exudate, posterior oropharyngeal edema or posterior oropharyngeal erythema.  Eyes: Conjunctivae and EOM are normal. Pupils are equal, round, and reactive to light. No scleral icterus.  Neck: Normal range of  motion. Neck supple. Carotid bruit is not present. No thyromegaly present.  Cardiovascular: Normal rate, regular rhythm and intact distal pulses.   Murmur (3/6 SEM with radiation to carotids) heard. Pulses:      Radial pulses are 2+ on the right side, and 2+ on the left side.  Pulmonary/Chest: Effort normal and breath sounds normal. No respiratory distress. He has no wheezes. He has no rales.  Abdominal: Soft. Bowel sounds are normal. He exhibits no distension and no mass. There is no tenderness. There is no rebound and no guarding.  obese  Genitourinary: Rectum normal. Rectal exam shows no external hemorrhoid, no internal hemorrhoid, no fissure, no mass, no tenderness and anal tone normal. Prostate is enlarged (25gm). Prostate is not tender.  Musculoskeletal: Normal range of motion. He exhibits no edema.  Lymphadenopathy:    He has no cervical adenopathy.  Neurological: He is alert and oriented to person, place, and time.  CN grossly intact, station and gait intact 3/3 recall 5/5 concentration serial 7s  Skin: Skin is warm and dry. No rash noted.  Psychiatric: He has a normal mood and affect. His behavior is normal. Judgment and thought content normal.   Results for orders placed in visit on 09/03/13  HM DIABETES EYE EXAM      Result Value Ref Range   HM Diabetic Eye Exam Retinopathy (*) No Retinopathy      Assessment & Plan:   Problem List Items Addressed This Visit   Type 2 diabetes, controlled, with retinopathy     Chronic, stable.  A1c 6.5%.  Continue metformin and glipizide. UTD eye exam.  Check feet next visit.    HYPERLIPIDEMIA     Chronic, stable. Continue with diet control.  Pt resistant to statins.    HYPERTENSION     Chronic, stable. Goal for him <130/80.  Stop lisinopril and all ACEI 2/2 h/o hyperkalemia, continue lasix alone.  Cr stable. Recheck K in 1-2 mo when returns for PSA.    Aortic stenosis     Moderate on recent US - due for f/u. Will order today.     Relevant Orders      2D Echocardiogram without contrast   Medicare annual wellness visit, subsequent - Primary     I have personally reviewed the Medicare Annual Wellness questionnaire and have noted 1. The patient's medical and social history 2. Their use of alcohol, tobacco or illicit drugs 3. Their current medications and supplements 4. The patient's functional ability including ADL's, fall risks, home safety risks and hearing or visual impairment. 5. Diet and physical activity 6. Evidence for depression or mood disorders The patients weight, height, BMI have been recorded in the chart.  Hearing and vision has been addressed. I have made referrals, counseling and provided education to the patient based review of the above and I have provided the pt with a written personalized care plan for preventive services. See scanned questionairre. Advanced directives discussed: pt will bring me copy of advanced directives - reminded him again  Reviewed preventative protocols and updated unless  pt declined. iFOB today prevnar today.    Elevated PSA      Last year with isolated elevation to 4s, on recheck dropped to 2.2.  This year again elevated to 4 - very low grade elevation and DRE reassuring today with mild BPH. Discussed options.  Recommend recheck in next 1-2 months for closer monitoring of PSA. No fmhx prostate cancer  Lab Results  Component Value Date   PSA 4.13* 09/01/2013   PSA 2.27 12/31/2012   PSA 4.24* 09/02/2012      Relevant Orders      PSA   Vitamin B12 deficiency     Has not received B12 shot recently.  Recent check WNL.  Will stop shots and start B12 1000 mcg daily. Discussed importance of continued oral supplementation daily.    Chronic kidney disease     Borderline stage 3 - reviewed with patient as well as importance of good hydration status and avoiding NSAIDs.    Acute bronchitis     Slowly resolving after zpack then amox course.     Other Visit Diagnoses    Special screening for malignant neoplasms, colon        Relevant Orders       Fecal occult blood, imunochemical    Hyperkalemia        Relevant Orders       Potassium        Follow up plan: Return in about 6 months (around 03/05/2014), or as needed, for follow up visit.

## 2013-09-03 NOTE — Assessment & Plan Note (Signed)
Moderate on recent US - due for f/u. Will order today.

## 2013-09-03 NOTE — Assessment & Plan Note (Signed)
Slowly resolving after zpack then amox course.

## 2013-09-05 ENCOUNTER — Telehealth: Payer: Self-pay

## 2013-09-05 ENCOUNTER — Other Ambulatory Visit: Payer: Self-pay | Admitting: Family Medicine

## 2013-09-05 NOTE — Telephone Encounter (Signed)
Pt checking on status of refills from [facility] advised will refill furosemide, glipizide,metformin and oxybutynin and will send ibuprofen for approval. Pt was appreciative and voiced understanding.

## 2013-09-05 NOTE — Telephone Encounter (Signed)
Pt wanted to know when seen 09/03/13 if all meds were refilled. Pt was advised no refills done on 09/03/13; pt will ck with [facility] to see if any refills needed at this time.

## 2013-09-05 NOTE — Telephone Encounter (Signed)
Will refill electronically  

## 2013-09-08 ENCOUNTER — Encounter: Payer: Medicare HMO | Admitting: Family Medicine

## 2013-09-10 ENCOUNTER — Other Ambulatory Visit: Payer: Commercial Managed Care - HMO

## 2013-09-10 DIAGNOSIS — Z1211 Encounter for screening for malignant neoplasm of colon: Secondary | ICD-10-CM

## 2013-09-10 LAB — FECAL OCCULT BLOOD, IMMUNOCHEMICAL: Fecal Occult Bld: NEGATIVE

## 2013-09-16 ENCOUNTER — Telehealth: Payer: Self-pay

## 2013-09-16 NOTE — Telephone Encounter (Signed)
Relevant patient education assigned to patient using Emmi. ° °

## 2013-09-17 ENCOUNTER — Ambulatory Visit (HOSPITAL_COMMUNITY): Payer: Medicare HMO | Attending: Family Medicine | Admitting: Radiology

## 2013-09-17 DIAGNOSIS — I359 Nonrheumatic aortic valve disorder, unspecified: Secondary | ICD-10-CM

## 2013-09-17 DIAGNOSIS — I350 Nonrheumatic aortic (valve) stenosis: Secondary | ICD-10-CM

## 2013-09-17 NOTE — Progress Notes (Signed)
Echocardiogram performed.  

## 2013-09-18 ENCOUNTER — Encounter: Payer: Self-pay | Admitting: Family Medicine

## 2013-09-18 ENCOUNTER — Other Ambulatory Visit: Payer: Self-pay | Admitting: Family Medicine

## 2013-09-18 DIAGNOSIS — I350 Nonrheumatic aortic (valve) stenosis: Secondary | ICD-10-CM

## 2013-10-08 ENCOUNTER — Ambulatory Visit (INDEPENDENT_AMBULATORY_CARE_PROVIDER_SITE_OTHER): Payer: Commercial Managed Care - HMO | Admitting: *Deleted

## 2013-10-08 ENCOUNTER — Other Ambulatory Visit (INDEPENDENT_AMBULATORY_CARE_PROVIDER_SITE_OTHER): Payer: Commercial Managed Care - HMO

## 2013-10-08 DIAGNOSIS — Z23 Encounter for immunization: Secondary | ICD-10-CM

## 2013-10-08 DIAGNOSIS — E875 Hyperkalemia: Secondary | ICD-10-CM

## 2013-10-08 DIAGNOSIS — R972 Elevated prostate specific antigen [PSA]: Secondary | ICD-10-CM

## 2013-10-08 LAB — PSA: PSA: 3.18 ng/mL (ref 0.10–4.00)

## 2013-10-08 LAB — POTASSIUM: Potassium: 4.9 mEq/L (ref 3.5–5.1)

## 2013-10-16 ENCOUNTER — Ambulatory Visit (INDEPENDENT_AMBULATORY_CARE_PROVIDER_SITE_OTHER): Payer: Medicare HMO | Admitting: Internal Medicine

## 2013-10-16 ENCOUNTER — Encounter: Payer: Self-pay | Admitting: Internal Medicine

## 2013-10-16 VITALS — BP 151/63 | HR 55 | Ht 74.0 in | Wt 282.0 lb

## 2013-10-16 DIAGNOSIS — I359 Nonrheumatic aortic valve disorder, unspecified: Secondary | ICD-10-CM

## 2013-10-16 DIAGNOSIS — I350 Nonrheumatic aortic (valve) stenosis: Secondary | ICD-10-CM

## 2013-10-16 DIAGNOSIS — I5032 Chronic diastolic (congestive) heart failure: Secondary | ICD-10-CM

## 2013-10-16 DIAGNOSIS — I4891 Unspecified atrial fibrillation: Secondary | ICD-10-CM

## 2013-10-16 MED ORDER — FUROSEMIDE 40 MG PO TABS: 40.0000 mg | ORAL_TABLET | Freq: Every day | ORAL | Status: DC

## 2013-10-16 MED ORDER — APIXABAN 5 MG PO TABS: 5.0000 mg | ORAL_TABLET | Freq: Two times a day (BID) | ORAL | Status: DC

## 2013-10-16 NOTE — Progress Notes (Signed)
HPI Mr. Sieling returns today after a long absence from our EP clinic. He is a morbidly obese man who has developed worsening sob. He has class 2B symptoms and recent 2D echo demonstrated that the patient had mod-severe aortic stenosis and mild mitral regurgitation. He has preserved LV function. He has had atrial fibrillation and previously refused anti-coagulation except for ASA. He had hip replacement surgery and feels well with no residual hip pain.  No Known Allergies   Current Outpatient Prescriptions  Medication Sig Dispense Refill  . aspirin EC 81 MG tablet Take 81 mg by mouth daily. Pt does not take this everyday(10/16/13)      . BESIVANCE 0.6 % SUSP Place 1 drop into the left eye daily.      . cholecalciferol (VITAMIN D) 1000 UNITS tablet Take 1,000 Units by mouth daily.      . enalapril (VASOTEC) 10 MG tablet Take 1 tablet by mouth daily.      . furosemide (LASIX) 40 MG tablet Take 1 tablet (40 mg total) by mouth daily.  90 tablet  3  . glipiZIDE (GLUCOTROL) 10 MG tablet TAKE 1 TABLET EVERY DAY  90 tablet  3  . ibuprofen (ADVIL,MOTRIN) 800 MG tablet TAKE 1 TABLET TWICE DAILY AS NEEDED  FOR  PAIN  180 tablet  3  . metFORMIN (GLUCOPHAGE) 1000 MG tablet TAKE 1 TABLET TWICE DAILY WITH MEALS  180 tablet  3  . Multiple Vitamins-Minerals (MULTIVITAMIN PO) Multivitamin packet containing 5 tablets.      Marland Kitchen oxybutynin (DITROPAN) 5 MG tablet TAKE 1 TABLET THREE TIMES DAILY  270 tablet  3  . vitamin B-12 (CYANOCOBALAMIN) 1000 MCG tablet Take 1,000 mcg by mouth daily.      Marland Kitchen amoxicillin (AMOXIL) 875 MG tablet Take 875 mg by mouth 2 (two) times daily. Prior to dental appointments      . apixaban (ELIQUIS) 5 MG TABS tablet Take 1 tablet (5 mg total) by mouth 2 (two) times daily.  60 tablet  0  . [DISCONTINUED] oxybutynin (DITROPAN XL) 15 MG 24 hr tablet Take 1 tablet (15 mg total) by mouth daily.  90 tablet  3   No current facility-administered medications for this visit.     Past Medical  History  Diagnosis Date  . Hypertension   . Type 2 diabetes, controlled, with retinopathy 1980s  . Exogenous obesity   . Urge incontinence   . Lyme disease 2012    ?titers negative  . History of chicken pox   . Aortic stenosis 12/2010    a. Echo 12/29/10: Mild LVH, EF 50-55%, normal wall motion, mild aortic stenosis, trivial AI, mean gradient 19 mmHg, mild LAE;  b. Echo 11/13:  mod LVH, vigorous LV EF 65-70%, mod AS, mean 23 mmHg, mild MR, mild BAE, mild RVE  c. echo 08/2013 with mod-severe AS  . Kidney stones remote  . GERD (gastroesophageal reflux disease)     occasional  . Atrial fibrillation     ?hx  . Vitamin B12 deficiency     IF normal (2014)    ROS:   All systems reviewed and negative except as noted in the HPI.   Past Surgical History  Procedure Laterality Date  . Kidney stone removal    . Finger sx    . US echocardiography  12/2010    EF 50-55%, mild LVH, normal wall motion, high LV filling pressures, mild AS, LA mildly dilated  . Back surgery  20 yrs  ago    removal of lower back cartilage due to damage. Patient does not remember date of surgery.  . Total hip arthroplasty  03/12/2012    RIGHT TOTAL HIP ARTHROPLASTY ANTERIOR APPROACH;  Surgeon: Mcarthur Rossetti, MD;     Family History  Problem Relation Age of Onset  . Diabetes Father   . Diabetes Mother   . Heart disease Father   . Heart disease Mother   . Cancer Brother     throat     History   Social History  . Marital Status: Married    Spouse Name: N/A    Number of Children: N/A  . Years of Education: N/A   Occupational History  . Not on file.   Social History Main Topics  . Smoking status: Never Smoker   . Smokeless tobacco: Never Used  . Alcohol Use: Yes     Comment: Regular (bourbon and coke 3-4/day)  . Drug Use: No  . Sexual Activity: Not on file   Other Topics Concern  . Not on file   Social History Narrative   Caffeine: coffee 1 cup/day   Lives with wife [name]), 1 cat    Occupation: retired, worked for [employer]   Edu: BS   Activity: no regular exercise   Diet: some water, fruits/vegetables daily      Advanced directives: has living will at home. Does not want prolonged life support "no extraordinary measures" but would accept temporary measures. Would want wife to be HCPOA.      BP 151/63  Pulse 55  Ht 6\' 2"  (1.88 m)  Wt 282 lb (127.914 kg)  BMI 36.19 kg/m2  Physical Exam:  Well appearing 73 yo man, NAD HEENT: Unremarkable Neck:  No JVD, no thyromegally Back:  No CVA tenderness Lungs:  Clear with no wheezes HEART:  IRegular rate rhythm, no murmurs, no rubs, no clicks Abd:  soft, positive bowel sounds, no organomegally, no rebound, no guarding Ext:  2 plus pulses, no edema, no cyanosis, no clubbing Skin:  No rashes no nodules Neuro:  CN II through XII intact, motor grossly intact  EKG - atrial fib with a controlled VR   Assess/Plan:

## 2013-10-16 NOTE — Assessment & Plan Note (Signed)
His valve is close but not yet severe enough to recommend replacement. Will follow.

## 2013-10-16 NOTE — Assessment & Plan Note (Signed)
His symptoms are class 2. He will increase his dose of lasix.

## 2013-10-16 NOTE — Patient Instructions (Signed)
Your physician wants you to follow-up in: 6 months with Dr Knox Saliva will receive a reminder letter in the mail two months in advance. If you don't receive a letter, please call our office to schedule the follow-up appointment.  Your physician has recommended you make the following change in your medication:  1) Increase 40mg  daily 2) Start Eliquis 5mg  twice daily

## 2013-10-16 NOTE — Assessment & Plan Note (Signed)
His ventricular rate is controlled. I have recommended that he start systemic anticoagulation. No plans for DCCV at this point.

## 2013-10-17 ENCOUNTER — Telehealth: Payer: Self-pay | Admitting: Internal Medicine

## 2013-10-17 NOTE — Telephone Encounter (Signed)
New message     Having cataract surgery 6-15 left eye and 7-6 right eye.  Should he start eliquis now or wait until after surgery?

## 2013-10-17 NOTE — Telephone Encounter (Signed)
Spoke with patient and let him know to go ahead and stasrt and then hold 48 hours before and restart the night off surgery.  He verbalized understanding and agrees.  I told him the MD doing the surgery will most likely send Dr Lovena Le a note for clearance

## 2013-11-07 ENCOUNTER — Other Ambulatory Visit: Payer: Self-pay | Admitting: Family Medicine

## 2013-12-01 ENCOUNTER — Other Ambulatory Visit: Payer: Self-pay | Admitting: Family Medicine

## 2013-12-01 ENCOUNTER — Ambulatory Visit (INDEPENDENT_AMBULATORY_CARE_PROVIDER_SITE_OTHER): Payer: Commercial Managed Care - HMO | Admitting: Family Medicine

## 2013-12-01 ENCOUNTER — Encounter: Payer: Self-pay | Admitting: Family Medicine

## 2013-12-01 VITALS — BP 128/74 | HR 70 | Temp 98.1°F | Wt 261.5 lb

## 2013-12-01 DIAGNOSIS — S51809A Unspecified open wound of unspecified forearm, initial encounter: Secondary | ICD-10-CM

## 2013-12-01 DIAGNOSIS — I350 Nonrheumatic aortic (valve) stenosis: Secondary | ICD-10-CM

## 2013-12-01 DIAGNOSIS — J029 Acute pharyngitis, unspecified: Secondary | ICD-10-CM

## 2013-12-01 DIAGNOSIS — M545 Low back pain, unspecified: Secondary | ICD-10-CM

## 2013-12-01 DIAGNOSIS — I359 Nonrheumatic aortic valve disorder, unspecified: Secondary | ICD-10-CM

## 2013-12-01 DIAGNOSIS — I4819 Other persistent atrial fibrillation: Secondary | ICD-10-CM

## 2013-12-01 DIAGNOSIS — G8929 Other chronic pain: Secondary | ICD-10-CM

## 2013-12-01 DIAGNOSIS — I4891 Unspecified atrial fibrillation: Secondary | ICD-10-CM

## 2013-12-01 DIAGNOSIS — S51801A Unspecified open wound of right forearm, initial encounter: Secondary | ICD-10-CM

## 2013-12-01 LAB — RENAL FUNCTION PANEL
Albumin: 4 g/dL (ref 3.5–5.2)
BUN: 31 mg/dL — ABNORMAL HIGH (ref 6–23)
CHLORIDE: 104 meq/L (ref 96–112)
CO2: 27 meq/L (ref 19–32)
Calcium: 9.8 mg/dL (ref 8.4–10.5)
Creatinine, Ser: 1.9 mg/dL — ABNORMAL HIGH (ref 0.4–1.5)
GFR: 37.82 mL/min — AB (ref >60.00)
Glucose, Bld: 79 mg/dL (ref 70–99)
POTASSIUM: 4.9 meq/L (ref 3.5–5.1)
Phosphorus: 2.9 mg/dL (ref 2.3–4.6)
SODIUM: 138 meq/L (ref 135–145)

## 2013-12-01 MED ORDER — METFORMIN HCL 500 MG PO TABS: 500.0000 mg | ORAL_TABLET | Freq: Two times a day (BID) | ORAL | Status: DC

## 2013-12-01 MED ORDER — APIXABAN 5 MG PO TABS: 5.0000 mg | ORAL_TABLET | Freq: Two times a day (BID) | ORAL | Status: DC

## 2013-12-01 MED ORDER — FUROSEMIDE 20 MG PO TABS: 20.0000 mg | ORAL_TABLET | Freq: Every day | ORAL | Status: DC

## 2013-12-01 NOTE — Assessment & Plan Note (Addendum)
Supportive care as per instructions. No evidence of bacterial infection today.

## 2013-12-01 NOTE — Assessment & Plan Note (Addendum)
Reviewed pros/cons of anticoagulation specifically eliquis.  Pt aware atrial fibrillation carries risk of stroke, but he is hesitant to start eliquis because he feels at baseline he already easily bruises/bleeds. He quotes 10% risk of stroke off blood thinner with 1% risk of stroke on blood thinner.  His chadsvasc score = 5. He has decided to forego anticoagulation for the time being. I will route today's note to Dr. Lovena Le to be aware.

## 2013-12-01 NOTE — Assessment & Plan Note (Addendum)
Wound edges approximated with dermabond, pt tolerated well. Discussed aftercare. Skin too think and frail to suture.

## 2013-12-01 NOTE — Progress Notes (Signed)
BP 128/74  Pulse 70  Temp(Src) 98.1 F (36.7 C) (Oral)  Wt 261 lb 8 oz (118.616 kg)  SpO2 95%   CC: URI  Subjective:    Patient ID: Bill [name], male    DOB: Dec 10, 1940, 73 y.o.   MRN: [id number]  HPI: Bill [name] is a 73 y.o. male presenting on 12/01/2013 for URI and Medication Problem   ST ongoing for last 5 days. More irritated, not painful. Improving. Mild cough prevalent and mildly productive if am. Some PNdrainage.   No fevers/chills, abd pain, nausea, ear or tooth pain, headaches.   Hasn't tried anything for this so far other than a few aspirin. No sick contacts at home. No smokers at home.  No h/o asthma.  Small lac/abrasion to R forearm while tightening bolt this morning. This happened around 11:30am today. Did bleed initially, but now has stopped. Worried about starting eliquis 2/2 propensity to bleed with thin skin.  Also has questions about eliquis and his atrial fibrillation. Has been recommended to start anticoagulation by cardiology, hesitant to do this. Recently completed cataract surgeries bilateral eyes.  Relevant past medical, surgical, family and social history reviewed and updated as indicated.  Allergies and medications reviewed and updated. Current Outpatient Prescriptions on File Prior to Visit  Medication Sig  . amoxicillin (AMOXIL) 875 MG tablet Take 875 mg by mouth 2 (two) times daily. Prior to dental appointments  . BESIVANCE 0.6 % SUSP Place 1 drop into the left eye daily.  . cholecalciferol (VITAMIN D) 1000 UNITS tablet Take 1,000 Units by mouth daily.  . enalapril (VASOTEC) 10 MG tablet TAKE 1 TABLET EVERY MORNING  . furosemide (LASIX) 40 MG tablet Take 1 tablet (40 mg total) by mouth daily.  Marland Kitchen glipiZIDE (GLUCOTROL) 10 MG tablet TAKE 1 TABLET EVERY DAY  . ibuprofen (ADVIL,MOTRIN) 800 MG tablet TAKE 1 TABLET TWICE DAILY AS NEEDED  FOR  PAIN  . metFORMIN (GLUCOPHAGE) 1000 MG tablet TAKE 1 TABLET TWICE DAILY WITH MEALS  . Multiple  Vitamins-Minerals (MULTIVITAMIN PO) Multivitamin packet containing 5 tablets.  Marland Kitchen oxybutynin (DITROPAN) 5 MG tablet TAKE 1 TABLET THREE TIMES DAILY  . vitamin B-12 (CYANOCOBALAMIN) 1000 MCG tablet Take 1,000 mcg by mouth daily.  . [DISCONTINUED] oxybutynin (DITROPAN XL) 15 MG 24 hr tablet Take 1 tablet (15 mg total) by mouth daily.   No current facility-administered medications on file prior to visit.    Review of Systems Per HPI unless specifically indicated above    Objective:    BP 128/74  Pulse 70  Temp(Src) 98.1 F (36.7 C) (Oral)  Wt 261 lb 8 oz (118.616 kg)  SpO2 95%  Physical Exam  Nursing note and vitals reviewed. Constitutional: He appears well-developed and well-nourished. No distress.  HENT:  Mouth/Throat: Oropharynx is clear and moist. No oropharyngeal exudate.  Cardiovascular: Normal rate and intact distal pulses.  An irregular rhythm present.  Murmur (4/6 systolic best at LUSB) heard. Pulmonary/Chest: Effort normal and breath sounds normal. No respiratory distress. He has no wheezes. He has no rales.  Musculoskeletal: He exhibits no edema.  Skin:  Bruising on bilateral forearms R forearm with 1.5cm superficial lac.   Forearm would cleaned with betadine then washed with sterile water. dermabond used to protect/approximate skin. Pt tolerated well.    Assessment & Plan:   Problem List Items Addressed This Visit   Open wound of forearm     Wound edges approximated with dermabond, pt tolerated well. Discussed aftercare. Skin too think and frail  to suture.    Chronic lower back pain     Desires to continue ibuprofen (another reason he does not want to start anticoagulation).  Tramadol has not been effective in the past. Not willing to try stronger narcotic 2/2 concerns for constipation.    Atrial fibrillation - Primary     Reviewed pros/cons of anticoagulation specifically eliquis.  Pt aware atrial fibrillation carries risk of stroke, but he is hesitant to  start eliquis because he feels at baseline he already easily bruises/bleeds. He quotes 10% risk of stroke off blood thinner with 1% risk of stroke on blood thinner.  His chadsvasc score = 5. He has decided to forego anticoagulation for the time being. I will route today's note to Dr. Lovena Le to be aware.    Relevant Medications      apixaban (ELIQUIS) tablet   Other Relevant Orders      Renal Function Panel (Completed)   Aortic stenosis     Mod-severe by latest echo. Followed by cards. No need for replacement currently. Pt has dyspnea with exertion at baseline.    Relevant Medications      apixaban (ELIQUIS) tablet   Acute pharyngitis     Supportive care as per instructions. No evidence of bacterial infection today.        Follow up plan: No Follow-up on file.

## 2013-12-01 NOTE — Assessment & Plan Note (Signed)
Desires to continue ibuprofen (another reason he does not want to start anticoagulation).  Tramadol has not been effective in the past. Not willing to try stronger narcotic 2/2 concerns for constipation.

## 2013-12-01 NOTE — Progress Notes (Signed)
Pre visit review using our clinic review tool, if applicable. No additional management support is needed unless otherwise documented below in the visit note. 

## 2013-12-01 NOTE — Assessment & Plan Note (Signed)
Mod-severe by latest echo. Followed by cards. No need for replacement currently. Pt has dyspnea with exertion at baseline.

## 2013-12-01 NOTE — Patient Instructions (Signed)
You have viral pharyngitis - should improve with time. Push fluids and plenty of rest. Salt water gargles. Suck on cold things like popsicles or warm things like herbal teas (whichever soothes the throat better). Return if fever >101.5, worsening pain, or trouble opening/closing mouth, or hoarse voice. Dermabond used on R forearm wound. Good to see you today, call clinic with questions.

## 2014-02-18 ENCOUNTER — Ambulatory Visit (INDEPENDENT_AMBULATORY_CARE_PROVIDER_SITE_OTHER): Payer: Commercial Managed Care - HMO

## 2014-02-18 DIAGNOSIS — Z23 Encounter for immunization: Secondary | ICD-10-CM

## 2014-02-26 ENCOUNTER — Encounter: Payer: Self-pay | Admitting: Family Medicine

## 2014-02-26 DIAGNOSIS — H61002 Unspecified perichondritis of left external ear: Secondary | ICD-10-CM | POA: Insufficient documentation

## 2014-03-24 ENCOUNTER — Other Ambulatory Visit: Payer: Self-pay | Admitting: *Deleted

## 2014-03-24 MED ORDER — GLUCOSE BLOOD VI STRP: 1.0000 | ORAL_STRIP | Freq: Every day | Status: DC

## 2014-06-19 ENCOUNTER — Telehealth: Payer: Self-pay

## 2014-06-19 DIAGNOSIS — I350 Nonrheumatic aortic (valve) stenosis: Secondary | ICD-10-CM

## 2014-06-19 MED ORDER — FUROSEMIDE 20 MG PO TABS: 20.0000 mg | ORAL_TABLET | Freq: Every day | ORAL | Status: DC

## 2014-06-19 NOTE — Telephone Encounter (Signed)
Pt request refill furosemide to [facility]. Pt has changed ins and mail order pharmacy; advised pt done.

## 2014-06-22 ENCOUNTER — Other Ambulatory Visit: Payer: Self-pay | Admitting: *Deleted

## 2014-06-22 MED ORDER — GLIPIZIDE 10 MG PO TABS: 10.0000 mg | ORAL_TABLET | Freq: Every day | ORAL | Status: DC

## 2014-06-22 NOTE — Telephone Encounter (Signed)
Patient left a voicemail requesting refill on Glucotrol to be sent to [facility]. Patient advise this was been sent.

## 2014-08-13 ENCOUNTER — Encounter: Payer: Self-pay | Admitting: Family Medicine

## 2014-08-13 ENCOUNTER — Ambulatory Visit (INDEPENDENT_AMBULATORY_CARE_PROVIDER_SITE_OTHER): Payer: PPO | Admitting: Family Medicine

## 2014-08-13 VITALS — BP 114/62 | HR 81 | Temp 97.7°F | Resp 16 | Wt 276.0 lb

## 2014-08-13 DIAGNOSIS — E11319 Type 2 diabetes mellitus with unspecified diabetic retinopathy without macular edema: Secondary | ICD-10-CM | POA: Diagnosis not present

## 2014-08-13 DIAGNOSIS — E1121 Type 2 diabetes mellitus with diabetic nephropathy: Secondary | ICD-10-CM

## 2014-08-13 DIAGNOSIS — W57XXXA Bitten or stung by nonvenomous insect and other nonvenomous arthropods, initial encounter: Secondary | ICD-10-CM | POA: Diagnosis not present

## 2014-08-13 DIAGNOSIS — T148 Other injury of unspecified body region: Secondary | ICD-10-CM | POA: Diagnosis not present

## 2014-08-13 MED ORDER — GLIMEPIRIDE 1 MG PO TABS: 0.5000 mg | ORAL_TABLET | Freq: Every day | ORAL | Status: DC

## 2014-08-13 MED ORDER — DOXYCYCLINE HYCLATE 100 MG PO CAPS: 100.0000 mg | ORAL_CAPSULE | Freq: Two times a day (BID) | ORAL | Status: DC

## 2014-08-13 NOTE — Progress Notes (Signed)
BP 114/62 mmHg  Pulse 81  Temp(Src) 97.7 F (36.5 C) (Oral)  Resp 16  Wt 276 lb (125.193 kg)  SpO2 98%   CC: tick bite  Subjective:    Patient ID: Bill [name], male    DOB: 27-Feb-1941, 74 y.o.   MRN: [id number]  HPI: Bill [name] is a 74 y.o. male presenting on 08/13/2014 for Tick Removal   1 wk ago noticed tick on left shoulder, unsure duration. Removed tick but may not have gotten fully off.  Last night noticed ring around tick bite.  No fevers/chills, new joint pains, nausea, headaches, abd pain, fatigue.   Pt tried decreasing metformin but sugars started creeping up so he increased back to 1 full dose.   Lab Results  Component Value Date   HGBA1C 6.5 09/01/2013    Relevant past medical, surgical, family and social history reviewed and updated as indicated. Interim medical history since our last visit reviewed. Allergies and medications reviewed and updated. Current Outpatient Prescriptions on File Prior to Visit  Medication Sig  . cholecalciferol (VITAMIN D) 1000 UNITS tablet Take 1,000 Units by mouth daily.  . enalapril (VASOTEC) 10 MG tablet TAKE 1 TABLET EVERY MORNING  . furosemide (LASIX) 20 MG tablet Take 1 tablet (20 mg total) by mouth daily.  Marland Kitchen glipiZIDE (GLUCOTROL) 10 MG tablet Take 1 tablet (10 mg total) by mouth daily.  Marland Kitchen glucose blood test strip 1 each by Other route daily. Use as instructed to check blood sugar Dx: E11.319  **Truetest**  . ibuprofen (ADVIL,MOTRIN) 800 MG tablet TAKE 1 TABLET TWICE DAILY AS NEEDED  FOR  PAIN  . metFORMIN (GLUCOPHAGE) 500 MG tablet Take 1 tablet (500 mg total) by mouth 2 (two) times daily with a meal.  . Multiple Vitamins-Minerals (MULTIVITAMIN PO) Multivitamin packet containing 5 tablets.  Marland Kitchen oxybutynin (DITROPAN) 5 MG tablet TAKE 1 TABLET THREE TIMES DAILY  . vitamin B-12 (CYANOCOBALAMIN) 1000 MCG tablet Take 1,000 mcg by mouth daily.  Marland Kitchen apixaban (ELIQUIS) 5 MG TABS tablet Take 1 tablet (5 mg total) by mouth 2 (two)  times daily. (Patient not taking: Reported on 08/13/2014)  . BESIVANCE 0.6 % SUSP Place 1 drop into the left eye daily.  . Difluprednate (DUREZOL) 0.05 % EMUL Apply to eye.  . Nepafenac (ILEVRO) 0.3 % SUSP Apply to eye.   No current facility-administered medications on file prior to visit.    Review of Systems Per HPI unless specifically indicated above     Objective:    BP 114/62 mmHg  Pulse 81  Temp(Src) 97.7 F (36.5 C) (Oral)  Resp 16  Wt 276 lb (125.193 kg)  SpO2 98%  Wt Readings from Last 3 Encounters:  08/13/14 276 lb (125.193 kg)  12/01/13 261 lb 8 oz (118.616 kg)  10/16/13 282 lb (127.914 kg)    Physical Exam  Constitutional: He appears well-developed and well-nourished. No distress.  Musculoskeletal: He exhibits no edema.  Skin: Skin is warm and dry. No rash noted.  Erosion upper L lateral arm at site of tick bite without surrounding rash or erythema. Area evaluated with magnification and using forceps, 25g needle and alcohol, area scraped to remove possible residual scrap of tick jaw. Dressed with abx ointment and bandaid.  Nursing note and vitals reviewed.  Results for orders placed or performed in visit on 12/01/13  Renal Function Panel  Result Value Ref Range   Sodium 138 135 - 145 mEq/L   Potassium 4.9 3.5 - 5.1 mEq/L  Chloride 104 96 - 112 mEq/L   CO2 27 19 - 32 mEq/L   Calcium 9.8 8.4 - 10.5 mg/dL   Albumin 4.0 3.5 - 5.2 g/dL   BUN 31 (H) 6 - 23 mg/dL   Creatinine, Ser 1.9 (H) 0.4 - 1.5 mg/dL   Glucose, Bld 79 70 - 99 mg/dL   Phosphorus 2.9 2.3 - 4.6 mg/dL   GFR 37.82 (L) >60.00 mL/min      Assessment & Plan:   Problem List Items Addressed This Visit    Type 2 diabetes, controlled, with retinopathy    Pt has continued metformin. Advised cut in 1/2 current dose (so take 1/2 tab bid - unclear if he's on 500mg  or 1000mg ) continue glipizide. Recheck next visit with labs.      Tick bite - Primary    Possible residual tick jaw removed today. Wound  care discussed. Provided with WASP for doxy with indications of when to fill      Diabetic nephropathy       Follow up plan: Return keep appointment.

## 2014-08-13 NOTE — Assessment & Plan Note (Addendum)
Pt has continued metformin. Advised cut in 1/2 current dose (so take 1/2 tab bid - unclear if he's on 500mg  or 1000mg ) continue glipizide. Recheck next visit with labs.

## 2014-08-13 NOTE — Assessment & Plan Note (Signed)
Possible residual tick jaw removed today. Wound care discussed. Provided with WASP for doxy with indications of when to fill

## 2014-08-13 NOTE — Patient Instructions (Addendum)
Decrease metformin to 1/2 of your tablet at home twice daily. If sugars start creeping up, start amaryl (glimepiride) 1/2 tablet with breakfast.   For tick bite - change daily with antibiotic ointment and bandaid. Let us know if spreading redness or new rashes. Fill doxycycline antibiotic course if new fever, headache, rash, joint pains or abdominal pain or nausea.

## 2014-08-28 ENCOUNTER — Other Ambulatory Visit: Payer: Self-pay

## 2014-08-28 MED ORDER — GLUCOSE BLOOD VI STRP: 1.0000 | ORAL_STRIP | Freq: Every day | Status: DC

## 2014-08-28 NOTE — Telephone Encounter (Signed)
Pt left v/m requesting refill truetest test strips to [facility]. Spoke with Mrs Spanos and advised done.

## 2014-09-01 ENCOUNTER — Telehealth: Payer: Self-pay

## 2014-09-01 MED ORDER — LANCETS MISC: Status: DC

## 2014-09-01 MED ORDER — GLUCOSE BLOOD VI STRP: ORAL_STRIP | Status: DC

## 2014-09-01 MED ORDER — LDR BLOOD GLUCOSE TRUETEST W/DEVICE KIT: 1.0000 | PACK | Freq: Once | Status: DC

## 2014-09-01 NOTE — Telephone Encounter (Signed)
Ok to do. Thanks Check once daily and prn. #100 with 3 refills

## 2014-09-01 NOTE — Telephone Encounter (Signed)
Pt left v/m; [facility] does not have truetest test strips and pt request new order for one touch meter, test strips and lancets to [facility]. Pt request cb when completed.

## 2014-09-01 NOTE — Telephone Encounter (Signed)
Rx sent, pt notified. 

## 2014-09-03 MED ORDER — ONETOUCH ULTRA MINI W/DEVICE KIT: PACK | Status: DC

## 2014-09-03 MED ORDER — ONETOUCH DELICA LANCETS FINE MISC: Status: DC

## 2014-09-03 MED ORDER — GLUCOSE BLOOD VI STRP: ORAL_STRIP | Status: DC

## 2014-09-03 NOTE — Telephone Encounter (Addendum)
Pt received call from [facility] that diabetic testing supplies were sent in and not as one touch.spoke with pt and he will ck with ins co to see which specific one touch meter, test strips and lancets are approved and will cb with info.

## 2014-09-03 NOTE — Addendum Note (Signed)
Addended by: Helene Shoe on: 09/03/2014 04:35 PM   Modules accepted: Orders, Medications

## 2014-09-03 NOTE — Telephone Encounter (Signed)
Pt left v/m to use onetouch ultra mini device, test strips are ultra blue and delica lancets. Advised pt sent to [facility].

## 2014-09-09 ENCOUNTER — Other Ambulatory Visit: Payer: Self-pay | Admitting: Family Medicine

## 2014-09-09 DIAGNOSIS — N183 Chronic kidney disease, stage 3 unspecified: Secondary | ICD-10-CM

## 2014-09-09 DIAGNOSIS — I10 Essential (primary) hypertension: Secondary | ICD-10-CM

## 2014-09-09 DIAGNOSIS — E559 Vitamin D deficiency, unspecified: Secondary | ICD-10-CM

## 2014-09-09 DIAGNOSIS — E785 Hyperlipidemia, unspecified: Secondary | ICD-10-CM

## 2014-09-09 DIAGNOSIS — E11319 Type 2 diabetes mellitus with unspecified diabetic retinopathy without macular edema: Secondary | ICD-10-CM

## 2014-09-09 DIAGNOSIS — R972 Elevated prostate specific antigen [PSA]: Secondary | ICD-10-CM

## 2014-09-09 DIAGNOSIS — E1121 Type 2 diabetes mellitus with diabetic nephropathy: Secondary | ICD-10-CM

## 2014-09-10 ENCOUNTER — Other Ambulatory Visit (INDEPENDENT_AMBULATORY_CARE_PROVIDER_SITE_OTHER): Payer: PPO

## 2014-09-10 DIAGNOSIS — R972 Elevated prostate specific antigen [PSA]: Secondary | ICD-10-CM | POA: Diagnosis not present

## 2014-09-10 DIAGNOSIS — N183 Chronic kidney disease, stage 3 unspecified: Secondary | ICD-10-CM

## 2014-09-10 DIAGNOSIS — E1121 Type 2 diabetes mellitus with diabetic nephropathy: Secondary | ICD-10-CM | POA: Diagnosis not present

## 2014-09-10 DIAGNOSIS — E785 Hyperlipidemia, unspecified: Secondary | ICD-10-CM | POA: Diagnosis not present

## 2014-09-10 DIAGNOSIS — E11319 Type 2 diabetes mellitus with unspecified diabetic retinopathy without macular edema: Secondary | ICD-10-CM

## 2014-09-10 LAB — CBC WITH DIFFERENTIAL/PLATELET
BASOS PCT: 0.6 % (ref 0.0–3.0)
Basophils Absolute: 0 10*3/uL (ref 0.0–0.1)
Eosinophils Absolute: 0.2 10*3/uL (ref 0.0–0.7)
Eosinophils Relative: 3.8 % (ref 0.0–5.0)
HCT: 39.7 % (ref 39.0–52.0)
HEMOGLOBIN: 13.4 g/dL (ref 13.0–17.0)
LYMPHS ABS: 1 10*3/uL (ref 0.7–4.0)
LYMPHS PCT: 20.3 % (ref 12.0–46.0)
MCHC: 33.7 g/dL (ref 30.0–36.0)
MCV: 97.6 fl (ref 78.0–100.0)
MONOS PCT: 12.4 % — AB (ref 3.0–12.0)
Monocytes Absolute: 0.6 10*3/uL (ref 0.1–1.0)
Neutro Abs: 3.1 10*3/uL (ref 1.4–7.7)
Neutrophils Relative %: 62.9 % (ref 43.0–77.0)
PLATELETS: 162 10*3/uL (ref 150.0–400.0)
RBC: 4.06 Mil/uL — ABNORMAL LOW (ref 4.22–5.81)
RDW: 13.2 % (ref 11.5–15.5)
WBC: 5 10*3/uL (ref 4.0–10.5)

## 2014-09-10 LAB — RENAL FUNCTION PANEL
ALBUMIN: 4.1 g/dL (ref 3.5–5.2)
BUN: 27 mg/dL — AB (ref 6–23)
CALCIUM: 10.2 mg/dL (ref 8.4–10.5)
CHLORIDE: 101 meq/L (ref 96–112)
CO2: 26 mEq/L (ref 19–32)
Creatinine, Ser: 1.54 mg/dL — ABNORMAL HIGH (ref 0.40–1.50)
GFR: 46.93 mL/min — AB (ref >60.00)
Glucose, Bld: 119 mg/dL — ABNORMAL HIGH (ref 70–99)
Phosphorus: 3.4 mg/dL (ref 2.3–4.6)
Potassium: 5.7 mEq/L — ABNORMAL HIGH (ref 3.5–5.1)
Sodium: 136 mEq/L (ref 135–145)

## 2014-09-10 LAB — TSH: TSH: 3.1 u[IU]/mL (ref 0.35–4.50)

## 2014-09-10 LAB — MICROALBUMIN / CREATININE URINE RATIO
Creatinine,U: 163.4 mg/dL
MICROALB/CREAT RATIO: 5.9 mg/g (ref 0.0–30.0)
Microalb, Ur: 9.6 mg/dL — ABNORMAL HIGH (ref 0.0–1.9)

## 2014-09-10 LAB — HEMOGLOBIN A1C: Hgb A1c MFr Bld: 6.5 % (ref 4.6–6.5)

## 2014-09-10 LAB — LIPID PANEL
CHOLESTEROL: 181 mg/dL (ref 0–200)
HDL: 69.2 mg/dL (ref >39.00)
LDL Cholesterol: 96 mg/dL (ref 0–99)
NonHDL: 111.8
Total CHOL/HDL Ratio: 3
Triglycerides: 78 mg/dL (ref 0.0–149.0)
VLDL: 15.6 mg/dL (ref 0.0–40.0)

## 2014-09-10 LAB — VITAMIN D 25 HYDROXY (VIT D DEFICIENCY, FRACTURES): VITD: 34.6 ng/mL (ref 30.00–100.00)

## 2014-09-10 LAB — PSA: PSA: 1.3 ng/mL (ref 0.10–4.00)

## 2014-09-15 ENCOUNTER — Encounter: Payer: Self-pay | Admitting: Family Medicine

## 2014-09-15 ENCOUNTER — Ambulatory Visit (INDEPENDENT_AMBULATORY_CARE_PROVIDER_SITE_OTHER): Payer: PPO | Admitting: Family Medicine

## 2014-09-15 VITALS — BP 144/78 | HR 62 | Temp 97.5°F | Ht 73.0 in | Wt 269.8 lb

## 2014-09-15 DIAGNOSIS — E559 Vitamin D deficiency, unspecified: Secondary | ICD-10-CM

## 2014-09-15 DIAGNOSIS — IMO0001 Reserved for inherently not codable concepts without codable children: Secondary | ICD-10-CM

## 2014-09-15 DIAGNOSIS — E1122 Type 2 diabetes mellitus with diabetic chronic kidney disease: Secondary | ICD-10-CM | POA: Diagnosis not present

## 2014-09-15 DIAGNOSIS — Z1211 Encounter for screening for malignant neoplasm of colon: Secondary | ICD-10-CM

## 2014-09-15 DIAGNOSIS — Z0000 Encounter for general adult medical examination without abnormal findings: Secondary | ICD-10-CM

## 2014-09-15 DIAGNOSIS — I350 Nonrheumatic aortic (valve) stenosis: Secondary | ICD-10-CM | POA: Diagnosis not present

## 2014-09-15 DIAGNOSIS — N3941 Urge incontinence: Secondary | ICD-10-CM

## 2014-09-15 DIAGNOSIS — R259 Unspecified abnormal involuntary movements: Secondary | ICD-10-CM

## 2014-09-15 DIAGNOSIS — E11319 Type 2 diabetes mellitus with unspecified diabetic retinopathy without macular edema: Secondary | ICD-10-CM | POA: Diagnosis not present

## 2014-09-15 DIAGNOSIS — R251 Tremor, unspecified: Secondary | ICD-10-CM

## 2014-09-15 DIAGNOSIS — E785 Hyperlipidemia, unspecified: Secondary | ICD-10-CM

## 2014-09-15 DIAGNOSIS — I480 Paroxysmal atrial fibrillation: Secondary | ICD-10-CM

## 2014-09-15 DIAGNOSIS — N183 Chronic kidney disease, stage 3 unspecified: Secondary | ICD-10-CM

## 2014-09-15 DIAGNOSIS — Z7189 Other specified counseling: Secondary | ICD-10-CM | POA: Diagnosis not present

## 2014-09-15 DIAGNOSIS — E875 Hyperkalemia: Secondary | ICD-10-CM | POA: Insufficient documentation

## 2014-09-15 DIAGNOSIS — I10 Essential (primary) hypertension: Secondary | ICD-10-CM

## 2014-09-15 DIAGNOSIS — G252 Other specified forms of tremor: Secondary | ICD-10-CM | POA: Insufficient documentation

## 2014-09-15 LAB — RENAL FUNCTION PANEL
Albumin: 4.1 g/dL (ref 3.5–5.2)
BUN: 28 mg/dL — AB (ref 6–23)
CO2: 29 mEq/L (ref 19–32)
Calcium: 10.8 mg/dL — ABNORMAL HIGH (ref 8.4–10.5)
Chloride: 101 mEq/L (ref 96–112)
Creatinine, Ser: 1.46 mg/dL (ref 0.40–1.50)
GFR: 49.91 mL/min — ABNORMAL LOW (ref >60.00)
Glucose, Bld: 126 mg/dL — ABNORMAL HIGH (ref 70–99)
Phosphorus: 2.8 mg/dL (ref 2.3–4.6)
Potassium: 4.9 mEq/L (ref 3.5–5.1)
Sodium: 137 mEq/L (ref 135–145)

## 2014-09-15 MED ORDER — OXYBUTYNIN CHLORIDE 5 MG PO TABS: 5.0000 mg | ORAL_TABLET | Freq: Three times a day (TID) | ORAL | Status: DC

## 2014-09-15 MED ORDER — GLIPIZIDE 10 MG PO TABS: 5.0000 mg | ORAL_TABLET | Freq: Every day | ORAL | Status: DC

## 2014-09-15 MED ORDER — METFORMIN HCL 500 MG PO TABS: 500.0000 mg | ORAL_TABLET | Freq: Two times a day (BID) | ORAL | Status: DC

## 2014-09-15 MED ORDER — FUROSEMIDE 20 MG PO TABS: 20.0000 mg | ORAL_TABLET | Freq: Every day | ORAL | Status: DC

## 2014-09-15 MED ORDER — PROPRANOLOL HCL 20 MG PO TABS: 20.0000 mg | ORAL_TABLET | Freq: Two times a day (BID) | ORAL | Status: DC

## 2014-09-15 NOTE — Assessment & Plan Note (Signed)

## 2014-09-15 NOTE — Assessment & Plan Note (Signed)
Discussed healthy diet and lifestyle changes to affect sustainable weight loss. Body mass index is 35.6 kg/(m^2).

## 2014-09-15 NOTE — Assessment & Plan Note (Signed)
Presumed from NSAID and ACEI. Will recheck labs today off enalapril and likely will need to stay off ACEI.

## 2014-09-15 NOTE — Assessment & Plan Note (Signed)
Reviewed with patient, encouraged he schedule f/u with Dr Lovena Le.

## 2014-09-15 NOTE — Progress Notes (Signed)
BP 144/78 mmHg  Pulse 62  Temp(Src) 97.5 F (36.4 C) (Oral)  Ht _0  (1.854 m)  Wt 269 lb 12.8 oz (122.38 kg)  BMI 35.60 kg/m2   CC: medicare wellness visit  Subjective:    Patient ID: Bill [name], male    DOB: [birth year], 74 y.o.   MRN: [id number]  HPI: Bill [name] is a 74 y.o. male presenting on 09/15/2014 for Annual Exam   Did not need to fill doxycycline. Sugars elevated today.  Urge incontinence - satisfied with oxybutynin 56m tid prn. AS - mod-severe last [place]2015 R hand tremor - posture > resting. Mainly noticed while driving with hand on steering wheel. Mild sxs with eating or writing but not affecting ability to do these things. No imbalance, no memory trouble. No time relation.   Hyperkalemia - longstanding. Off ACEI completely for last few weeks. Checked vitamins and had extra potassium - he has stopped this.  Hearing screen passed today [facility] - eye doctor appt 12/2013 Denies depression/sadness/anhedonia. 1 fall in last year without injury.  Preventative: Colonoscopy 2002, told normal, rpt 10 yrs. Would like yearly stool kits. H/o hemorrhoids. Prostate screening - always normal. Denies nocturia, hesitancy or changes in stream. Interested in aging out. Flu shot 01/2014 Pneumonia shot 2007. prevnar 08/2013 Tetanus shot - Td 2013  zostavax - insurance will cover. Advanced directives: has living will at home. Does not want prolonged life support "no extraordinary measures" but would accept temporary measures. Would want wife to be HCPOA.   Caffeine: coffee 1 cup/day Lives with wife (Bill [name], 1 cat Occupation: retired, worked for [employer]: BS Activity: no regular exercise, gardening Diet: some water, fruits/vegetables daily  Relevant past medical, surgical, family and social history reviewed and updated as indicated. Interim medical history since our last visit reviewed. Allergies and medications reviewed and updated. Current Outpatient Prescriptions  on File Prior to Visit  Medication Sig  . Blood Glucose Monitoring Suppl (ONE TOUCH ULTRA MINI) W/DEVICE KIT Check blood sugar once a day and as needed. E11.319.  . cholecalciferol (VITAMIN D) 1000 UNITS tablet Take 1,000 Units by mouth daily.  .Marland Kitchenibuprofen (ADVIL,MOTRIN) 800 MG tablet TAKE 1 TABLET TWICE DAILY AS NEEDED  FOR  PAIN (Patient taking differently: TAKE 1/2 TABLET TWICE DAILY AS NEEDED  FOR  PAIN)  . Lancets MISC To use to check blood sugar once daily  E11.9  . Multiple Vitamins-Minerals (MULTIVITAMIN PO) Multivitamin packet containing 5 tablets.  .Glory RosebushDELICA LANCETS FINE MISC check blood sugar once a day and as needed. E11.319  . vitamin B-12 (CYANOCOBALAMIN) 1000 MCG tablet Take 1,000 mcg by mouth daily.   No current facility-administered medications on file prior to visit.    Review of Systems Per HPI unless specifically indicated above     Objective:    BP 144/78 mmHg  Pulse 62  Temp(Src) 97.5 F (36.4 C) (Oral)  Ht _1  (1.854 m)  Wt 269 lb 12.8 oz (122.38 kg)  BMI 35.60 kg/m2  Wt Readings from Last 3 Encounters:  09/15/14 269 lb 12.8 oz (122.38 kg)  08/13/14 276 lb (125.193 kg)  12/01/13 261 lb 8 oz (118.616 kg)    Physical Exam  Constitutional: He is oriented to person, place, and time. He appears well-developed and well-nourished. No distress.  Body mass index is 35.6 kg/(m^2).   HENT:  Head: Normocephalic and atraumatic.  Right Ear: Hearing, tympanic membrane, external ear and ear canal normal.  Left Ear: Hearing, tympanic  membrane, external ear and ear canal normal.  Nose: Nose normal.  Mouth/Throat: Uvula is midline, oropharynx is clear and moist and mucous membranes are normal. No oropharyngeal exudate, posterior oropharyngeal edema or posterior oropharyngeal erythema.  Eyes: Conjunctivae and EOM are normal. Pupils are equal, round, and reactive to light. No scleral icterus.  Neck: Normal range of motion. Neck supple. Carotid bruit is not  present. No thyromegaly present.  AS radiation to carotids  Cardiovascular: Normal rate, regular rhythm and intact distal pulses.   Murmur (4/6 SEM best at LUSB) heard. Pulses:      Radial pulses are 2+ on the right side, and 2+ on the left side.  Sounds regular today.  Pulmonary/Chest: Effort normal and breath sounds normal. No respiratory distress. He has no wheezes. He has no rales.  Abdominal: Soft. Bowel sounds are normal. He exhibits no distension and no mass. There is no tenderness. There is no rebound and no guarding.  Genitourinary: Rectum normal and prostate normal. Rectal exam shows no external hemorrhoid, no internal hemorrhoid, no fissure, no mass, no tenderness and anal tone normal. Prostate is not enlarged (15gm) and not tender.  Musculoskeletal: Normal range of motion. He exhibits no edema.  Lymphadenopathy:    He has no cervical adenopathy.  Neurological: He is alert and oriented to person, place, and time.  CN grossly intact, station and gait intact Recall 2/3, 2/3 with cue Calculation serial 7s 5/5  Skin: Skin is warm and dry. No rash noted.  Psychiatric: He has a normal mood and affect. His behavior is normal. Judgment and thought content normal.  Nursing note and vitals reviewed.  Results for orders placed or performed in visit on 09/10/14  Lipid panel  Result Value Ref Range   Cholesterol 181 0 - 200 mg/dL   Triglycerides 78.0 0.0 - 149.0 mg/dL   HDL 69.20 >39.00 mg/dL   VLDL 15.6 0.0 - 40.0 mg/dL   LDL Cholesterol 96 0 - 99 mg/dL   Total CHOL/HDL Ratio 3    NonHDL 111.80   Renal function panel  Result Value Ref Range   Sodium 136 135 - 145 mEq/L   Potassium 5.7 (H) 3.5 - 5.1 mEq/L   Chloride 101 96 - 112 mEq/L   CO2 26 19 - 32 mEq/L   Calcium 10.2 8.4 - 10.5 mg/dL   Albumin 4.1 3.5 - 5.2 g/dL   BUN 27 (H) 6 - 23 mg/dL   Creatinine, Ser 1.54 (H) 0.40 - 1.50 mg/dL   Glucose, Bld 119 (H) 70 - 99 mg/dL   Phosphorus 3.4 2.3 - 4.6 mg/dL   GFR 46.93 (L)  >60.00 mL/min  TSH  Result Value Ref Range   TSH 3.10 0.35 - 4.50 uIU/mL  Hemoglobin A1c  Result Value Ref Range   Hgb A1c MFr Bld 6.5 4.6 - 6.5 %  CBC with Differential/Platelet  Result Value Ref Range   WBC 5.0 4.0 - 10.5 K/uL   RBC 4.06 (L) 4.22 - 5.81 Mil/uL   Hemoglobin 13.4 13.0 - 17.0 g/dL   HCT 39.7 39.0 - 52.0 %   MCV 97.6 78.0 - 100.0 fl   MCHC 33.7 30.0 - 36.0 g/dL   RDW 13.2 11.5 - 15.5 %   Platelets 162.0 150.0 - 400.0 K/uL   Neutrophils Relative % 62.9 43.0 - 77.0 %   Lymphocytes Relative 20.3 12.0 - 46.0 %   Monocytes Relative 12.4 (H) 3.0 - 12.0 %   Eosinophils Relative 3.8 0.0 - 5.0 %  Basophils Relative 0.6 0.0 - 3.0 %   Neutro Abs 3.1 1.4 - 7.7 K/uL   Lymphs Abs 1.0 0.7 - 4.0 K/uL   Monocytes Absolute 0.6 0.1 - 1.0 K/uL   Eosinophils Absolute 0.2 0.0 - 0.7 K/uL   Basophils Absolute 0.0 0.0 - 0.1 K/uL  Vit D  25 hydroxy (rtn osteoporosis monitoring)  Result Value Ref Range   VITD 34.60 30.00 - 100.00 ng/mL  Microalbumin / creatinine urine ratio  Result Value Ref Range   Microalb, Ur 9.6 (H) 0.0 - 1.9 mg/dL   Creatinine,U 163.4 mg/dL   Microalb Creat Ratio 5.9 0.0 - 30.0 mg/g  PSA  Result Value Ref Range   PSA 1.30 0.10 - 4.00 ng/mL      Assessment & Plan:   Problem List Items Addressed This Visit    Vitamin D deficiency    Continue current dose vit D.      Urge incontinence    Continue oxybutynin 80m tid prn. Pt satisfied with this regimen.      Relevant Medications   oxybutynin (DITROPAN) 5 MG tablet   Type 2 diabetes, controlled, with retinopathy    Chronic, stable. Continue current regimen.  Lab Results  Component Value Date   HGBA1C 6.5 09/10/2014        Relevant Medications   glipiZIDE (GLUCOTROL) 10 MG tablet   metFORMIN (GLUCOPHAGE) 500 MG tablet   Tremor    Reviewed with patient. Anticipate essential tremor. Trial propranolol 216mbid.      Obesity, Class II, BMI 35-39.9, with comorbidity    Discussed healthy diet and  lifestyle changes to affect sustainable weight loss. Body mass index is 35.6 kg/(m^2).       Relevant Medications   glipiZIDE (GLUCOTROL) 10 MG tablet   metFORMIN (GLUCOPHAGE) 500 MG tablet   Moderate to severe aortic stenosis    Reviewed with patient, encouraged he schedule f/u with Dr TaLovena Le     Relevant Medications   furosemide (LASIX) 20 MG tablet   propranolol (INDERAL) 20 MG tablet   Medicare annual wellness visit, subsequent - Primary    I have personally reviewed the Medicare Annual Wellness questionnaire and have noted 1. The patient's medical and social history 2. Their use of alcohol, tobacco or illicit drugs 3. Their current medications and supplements 4. The patient's functional ability including ADL's, fall risks, home safety risks and hearing or visual impairment. 5. Diet and physical activity 6. Evidence for depression or mood disorders The patients weight, height, BMI have been recorded in the chart.  Hearing and vision has been addressed. I have made referrals, counseling and provided education to the patient based review of the above and I have provided the pt with a written personalized care plan for preventive services. Provider list updated - see scanned questionairre. Reviewed preventative protocols and updated unless pt declined.       Hyperkalemia    Presumed from NSAID and ACEI. Will recheck labs today off enalapril and likely will need to stay off ACEI.      Relevant Orders   Renal function panel   HLD (hyperlipidemia)    Chronic, stable off meds.      Relevant Medications   furosemide (LASIX) 20 MG tablet   propranolol (INDERAL) 20 MG tablet   Essential hypertension    Stop enalapril. Continue lasix. Add propranolol for tremor.      Relevant Medications   furosemide (LASIX) 20 MG tablet   propranolol (INDERAL) 20 MG tablet  CKD stage 3 due to type 2 diabetes mellitus    Deteriorated. Discussed importance of hydration status, discussed  need to avoid NSAIDs. Pt has decreased ibuprofen dose.      Relevant Medications   glipiZIDE (GLUCOTROL) 10 MG tablet   metFORMIN (GLUCOPHAGE) 500 MG tablet   Other Relevant Orders   Renal function panel   Atrial fibrillation    Decided to not take anticoagulation (eliquis).  Sounds regular today.      Relevant Medications   furosemide (LASIX) 20 MG tablet   propranolol (INDERAL) 20 MG tablet   Advanced care planning/counseling discussion    Advanced directives: has living will at home. Does not want prolonged life support "no extraordinary measures" but would accept temporary measures. Would want wife to be HCPOA.        Other Visit Diagnoses    Special screening for malignant neoplasms, colon        Relevant Orders    Fecal occult blood, imunochemical        Follow up plan: Return in about 6 months (around 03/17/2015), or as needed, for follow up visit.

## 2014-09-15 NOTE — Progress Notes (Signed)
Pre visit review using our clinic review tool, if applicable. No additional management support is needed unless otherwise documented below in the visit note. 

## 2014-09-15 NOTE — Assessment & Plan Note (Addendum)
Advanced directives: has living will at home. Does not want prolonged life support "no extraordinary measures" but would accept temporary measures. Would want wife to be HCPOA.

## 2014-09-15 NOTE — Assessment & Plan Note (Signed)
Stop enalapril. Continue lasix. Add propranolol for tremor.

## 2014-09-15 NOTE — Assessment & Plan Note (Signed)
Deteriorated. Discussed importance of hydration status, discussed need to avoid NSAIDs. Pt has decreased ibuprofen dose.

## 2014-09-15 NOTE — Assessment & Plan Note (Signed)
Continue oxybutynin 5mg  tid prn. Pt satisfied with this regimen.

## 2014-09-15 NOTE — Patient Instructions (Addendum)
Schedule follow up with Dr Lovena Le (last seen 09/2013). Pass by lab for stool kit and blood work.  Bring me a copy of your advanced directive to update chart. Try propranolol twice daily for tremor - watch heart rate on this medicine.  Return as needed or in 6 months for follow up. Good to see you today, call us with questions.

## 2014-09-15 NOTE — Assessment & Plan Note (Signed)
Chronic, stable off meds.  

## 2014-09-15 NOTE — Assessment & Plan Note (Signed)
Reviewed with patient. Anticipate essential tremor. Trial propranolol 20mg  bid.

## 2014-09-15 NOTE — Assessment & Plan Note (Signed)
Continue current dose vit D.

## 2014-09-15 NOTE — Assessment & Plan Note (Signed)
Chronic, stable. Continue current regimen.  Lab Results  Component Value Date   HGBA1C 6.5 09/10/2014

## 2014-09-15 NOTE — Assessment & Plan Note (Addendum)
Decided to not take anticoagulation (eliquis).  Sounds regular today.

## 2014-09-18 ENCOUNTER — Telehealth: Payer: Self-pay | Admitting: *Deleted

## 2014-09-22 MED ORDER — GLIPIZIDE 10 MG PO TABS: 10.0000 mg | ORAL_TABLET | Freq: Every day | ORAL | Status: DC

## 2014-09-22 NOTE — Addendum Note (Signed)
Addended by: Royann Shivers A on: 09/22/2014 04:53 PM   Modules accepted: Orders

## 2014-09-22 NOTE — Telephone Encounter (Signed)
Spoke with patient and cleared up question.

## 2014-09-22 NOTE — Telephone Encounter (Signed)
Patient left a voicemail that he received a call today from [facility] that there is still a mixup with his Glipizide. Patient stated that he spoke with Maudie Mercury last week and thought that this had been cleared up. Patient request a call back to get this straightened out. There is a mixup with the directions and dose.

## 2014-09-28 ENCOUNTER — Other Ambulatory Visit: Payer: PPO

## 2014-09-28 DIAGNOSIS — Z1211 Encounter for screening for malignant neoplasm of colon: Secondary | ICD-10-CM

## 2014-09-28 LAB — FECAL OCCULT BLOOD, IMMUNOCHEMICAL: Fecal Occult Bld: NEGATIVE

## 2014-09-28 LAB — FECAL OCCULT BLOOD, GUAIAC: Fecal Occult Blood: NEGATIVE

## 2014-09-29 ENCOUNTER — Encounter: Payer: Self-pay | Admitting: *Deleted

## 2014-10-22 ENCOUNTER — Ambulatory Visit (INDEPENDENT_AMBULATORY_CARE_PROVIDER_SITE_OTHER): Payer: PPO | Admitting: Family Medicine

## 2014-10-22 ENCOUNTER — Telehealth: Payer: Self-pay | Admitting: Family Medicine

## 2014-10-22 ENCOUNTER — Encounter: Payer: Self-pay | Admitting: Family Medicine

## 2014-10-22 VITALS — BP 128/74 | HR 68 | Temp 98.3°F | Wt 270.8 lb

## 2014-10-22 DIAGNOSIS — E1122 Type 2 diabetes mellitus with diabetic chronic kidney disease: Secondary | ICD-10-CM

## 2014-10-22 DIAGNOSIS — R0600 Dyspnea, unspecified: Secondary | ICD-10-CM | POA: Diagnosis not present

## 2014-10-22 DIAGNOSIS — I10 Essential (primary) hypertension: Secondary | ICD-10-CM

## 2014-10-22 DIAGNOSIS — R251 Tremor, unspecified: Secondary | ICD-10-CM

## 2014-10-22 DIAGNOSIS — I5032 Chronic diastolic (congestive) heart failure: Secondary | ICD-10-CM

## 2014-10-22 DIAGNOSIS — I480 Paroxysmal atrial fibrillation: Secondary | ICD-10-CM

## 2014-10-22 DIAGNOSIS — I350 Nonrheumatic aortic (valve) stenosis: Secondary | ICD-10-CM

## 2014-10-22 DIAGNOSIS — N183 Chronic kidney disease, stage 3 (moderate): Secondary | ICD-10-CM | POA: Diagnosis not present

## 2014-10-22 DIAGNOSIS — E875 Hyperkalemia: Secondary | ICD-10-CM

## 2014-10-22 MED ORDER — METOPROLOL TARTRATE 25 MG PO TABS: 12.5000 mg | ORAL_TABLET | Freq: Two times a day (BID) | ORAL | Status: DC | PRN

## 2014-10-22 MED ORDER — GABAPENTIN 100 MG PO CAPS: 100.0000 mg | ORAL_CAPSULE | Freq: Two times a day (BID) | ORAL | Status: DC

## 2014-10-22 NOTE — Telephone Encounter (Signed)
Noted  

## 2014-10-22 NOTE — Patient Instructions (Addendum)
I'm worried about worsening aortic stenosis.  Let's stop propranolol. Start metoprolol - may take 1/2 tablet twice daily as needed.  Continue enalapril and lasix for now.  Check labs and echo today and we will refer you back to cardiology.  Please let us know right away if worsening symptoms despite this.   Aortic Stenosis Aortic stenosis is a narrowing of the aortic valve. The aortic valve is a gatelike structure that is located between the lower left chamber of the heart (left ventricle) and the blood vessel that leads away from the heart (aorta). When the aortic valve is narrowed, it does not open all the way. This makes it hard for the heart to pump blood into the aorta and causes the heart to work harder. The extra work can weaken the heart over time and lead to heart failure. CAUSES  Causes of aortic stenosis include:  Calcium deposits on the aortic valve that have made the valve stiff. This condition generally affects those over the age of 55. It is the most common cause of aortic stenosis.  A birth defect.  Rheumatic fever. This is a problem that may occur after a strep throat infection that was not treated adequately. Rheumatic fever can cause permanent damage to heart valves. SIGNS AND SYMPTOMS  People with aortic stenosis usually have no symptoms until the condition becomes severe. It may take 10-20 years for mild or moderate aortic stenosis to become severe. Symptoms may include:   Shortness of breath, especially with physical activity.   Feeling weak and tired (fatigued) or getting tired easily.  Chest discomfort (angina). This may occur with minimal activity if the aortic stenosis is severe.  An irregular or faster-than-normal heartbeat.  Dizziness or fainting that happens with exertion or after taking certain heart medicines (such as nitroglycerin). DIAGNOSIS  Aortic stenosis is usually diagnosed with a physical exam and with a type of imaging test called  echocardiography. During echocardiography, sound waves are used to evaluate how blood flows through the heart. If your health care provider suspects aortic stenosis but the test does not clearly show it, a procedure called cardiac catheterization may be done to diagnose the condition. Tests may also be done to evaluate heart function. They may include:  Electrocardiography. During this test, the electrical impulses of the heart are recorded while you are lying down and sticky patches are placed on your chest, arms, and legs.  Stress tests. There is more than one type of stress test. If a stress test is needed, ask your health care provider about which type is best for you.  Blood tests. TREATMENT  Treatment depends on how severe the aortic stenosis is, your symptoms, and the problems it is causing.   Observation. If the aortic stenosis is mild, no treatment may be needed. However, you will need to have the condition checked regularly to make sure it is not getting worse or causing serious problems.  Surgery. Surgery to repair or replace the aortic valve is the most common treatment for aortic stenosis. Several types of surgeries are available. The most common are open-heart surgery and transcutaneous aortic valve replacement (TAVR). TAVR does not require that the chest be opened. It is usually performed on elderly patients and those who are not able to have open-heart surgery.  Medicines. Medicines may be given to keep symptoms from getting worse. Medicines cannot reverse aortic stenosis. HOME CARE INSTRUCTIONS   You may need to avoid certain types of physical activity. If your aortic stenosis  is mild, you may need to avoid only strenuous activity. The more severe your aortic stenosis, the more activities you will need to avoid. Talk with your health care provider about the types of activity you should avoid.  Take medicines only as directed by your health care provider.  If you are a woman with  aortic valve stenosis and want to get pregnant, talk to your health care provider before you become pregnant.  If you are a woman with aortic valve stenosis and are pregnant, keep all follow-up visits with all recommended health care providers.  Keep all follow-up visits for tests, exams, and treatments as directed by your health care provider. SEEK IMMEDIATE MEDICAL CARE IF:  You develop chest pain or tightness.   You develop shortness of breath or difficulty breathing.   You develop light-headedness or faint.   It feels like your heartbeat is irregular or faster than normal.  You have a fever. Document Released: 02/04/2003 Document Revised: 09/22/2013 Document Reviewed: 05/02/2012 [facility] Patient Information 2015 [place], [place]. This information is not intended to replace advice given to you by your health care provider. Make sure you discuss any questions you have with your health care provider.

## 2014-10-22 NOTE — Assessment & Plan Note (Addendum)
Thought essential tremor - propranolol 20mg  once daily significantly helped, but will stop beta blocker with concern for new symptomatic aortic stenosis - and instead trial gabapentin 100mg  qd to bid prn tremor. Pt agrees

## 2014-10-22 NOTE — Telephone Encounter (Signed)
Vaughan Basta with [facility] left v/m; pt having extreme fatigue,difficulty breathing for 1 week; difficult to move from one room to another due to breathing.pt was advised to go to ED by [facility] but pt wanted to be seen in office. Please advise.

## 2014-10-22 NOTE — Assessment & Plan Note (Addendum)
Concern for worsening aortic stenosis - will check BNP and update echocardiogram (last 08/2013). ?propranolo related worsening - so will stop this medication for now as well.  Will have pt return to see Dr Lovena Le as overdue.  Discussed reasons to seek ER care.

## 2014-10-22 NOTE — Telephone Encounter (Signed)
Patient Name: Bill OEHLERT DOB: [birth year] Initial Comment Caller having problems, tired all the time. Feels like he ran a mile. If he walks he feels exhausted. Just walking to mail box or in the house from one room to another is so tired. Has been going on this week. Not getting any better. Nurse Assessment Nurse: Marcelline Deist, RN, Lynda Date/Time (Eastern Time): 10/22/2014 11:43:10 AM Confirm and document reason for call. If symptomatic, describe symptoms. ---Caller having problems, tired all the time. Feels like he ran a mile. If he walks he feels exhausted. Just walking to mail box or in the house from one room to another is so tired. Has been going on this week. Not getting any better. Is also feeling SOB. No fever. Has the patient traveled out of the country within the last 30 days? ---Not Applicable Does the patient require triage? ---Yes Related visit to physician within the last 2 weeks? ---No Does the PT have any chronic conditions? (i.e. diabetes, asthma, etc.) ---Yes List chronic conditions. ---diabetes, BP rx. Guidelines Guideline Title Affirmed Question Affirmed Notes Weakness (Generalized) and Fatigue Difficulty breathing Final Disposition User Go to ED Now Marcelline Deist, RN, Kermit Balo Comments Spoke with staff at office, Mearl Latin the triage nurse was in meeting, not available to speak with nurse at this time, so left voice mail message re: pt's refusal of ER outcome. He would prefer to be seen by his Dr.

## 2014-10-22 NOTE — Telephone Encounter (Signed)
Spoke with patient and advised that he did need evaluation. Advised that since symptoms had been going on over 1 week, he should be okay to be seen in the office today. Dr. Darnell Level had no openings-scheduled with Dr. Silvio Pate at 4:00. Also advised that if he developed chest pain, extreme SOB w/o exertion or just worsening in general-to go to ER. He verbalized understanding.

## 2014-10-22 NOTE — Progress Notes (Signed)
Pre visit review using our clinic review tool, if applicable. No additional management support is needed unless otherwise documented below in the visit note. 

## 2014-10-22 NOTE — Progress Notes (Signed)
BP 128/74 mmHg  Pulse 68  Temp(Src) 98.3 F (36.8 C) (Oral)  Wt 270 lb 12 oz (122.811 kg)  SpO2 95%   CC: dspynea on exertion  Subjective:    Patient ID: Bill [name], male    DOB: 1940-06-10, 74 y.o.   MRN: [id number]  HPI: Bill [name] is a 74 y.o. male presenting on 10/22/2014 for Shortness of Breath and Medication Management   Pleasant 74yo with known hypertension, diabetes 2, obesity, and aortic stenosis (last echo 2015 with moderate to severe aortic stenosis and baseline dyspnea on exertion) and atrial fibrillation who decided to forgo anticoagulation (last discussion 11/2013 with myself and cardiology) who presents with 1 week history of worsening dyspnea on exertion and even at rest. No fatigue - energy level is actually great. 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  Increased hoarseness noted (lower voice than normal) as well as nasal congestion.   Denies fevers/chills, coughing, wheezing, chest pain/tightness, worsening leg swelling. No dizziness or syncope  Chronic hyperkalemia from ace inhibitors so we stopped enalapril. However, blood pressures were running elevated (198/140) so he has restarted enalapril 40m daily since early May.   Propranolol 243min am was started 08/2014 for tremors - this has helped significantly.   Relevant past medical, surgical, family and social history reviewed and updated as indicated. Interim medical history since our last visit reviewed. Allergies and medications reviewed and updated. Current Outpatient Prescriptions on File Prior to Visit  Medication Sig  . Blood Glucose Monitoring Suppl (ONE TOUCH ULTRA MINI) W/DEVICE KIT Check blood sugar once a day and as needed. E11.319.  . cholecalciferol (VITAMIN D) 1000 UNITS tablet Take 1,000 Units by mouth daily.  . furosemide (LASIX) 20 MG tablet Take 1 tablet (20 mg total) by mouth daily.  . Marland KitchenlipiZIDE (GLUCOTROL) 10 MG tablet Take 1 tablet (10 mg total)  by mouth daily before breakfast.  . ibuprofen (ADVIL,MOTRIN) 800 MG tablet TAKE 1 TABLET TWICE DAILY AS NEEDED  FOR  PAIN (Patient taking differently: TAKE 1/2 TABLET TWICE DAILY AS NEEDED  FOR  PAIN)  . Lancets MISC To use to check blood sugar once daily  E11.9  . metFORMIN (GLUCOPHAGE) 500 MG tablet Take 1 tablet (500 mg total) by mouth 2 (two) times daily with a meal.  . Multiple Vitamins-Minerals (MULTIVITAMIN PO) Multivitamin packet containing 5 tablets.  . Glory RosebushELICA LANCETS FINE MISC check blood sugar once a day and as needed. E11.319  . oxybutynin (DITROPAN) 5 MG tablet Take 1 tablet (5 mg total) by mouth 3 (three) times daily.  . vitamin B-12 (CYANOCOBALAMIN) 1000 MCG tablet Take 1,000 mcg by mouth daily.   No current facility-administered medications on file prior to visit.    Review of Systems Per HPI unless specifically indicated above     Objective:    BP 128/74 mmHg  Pulse 68  Temp(Src) 98.3 F (36.8 C) (Oral)  Wt 270 lb 12 oz (122.811 kg)  SpO2 95%  Wt Readings from Last 3 Encounters:  10/22/14 270 lb 12 oz (122.811 kg)  09/15/14 269 lb 12.8 oz (122.38 kg)  08/13/14 276 lb (125.193 kg)    Physical Exam  Constitutional: He appears well-developed and well-nourished. No distress.  HENT:  Mouth/Throat: Oropharynx is clear and moist. No oropharyngeal exudate.  Neck: No JVD present.  Cardiovascular: Normal rate, regular rhythm and intact distal pulses.   Murmur (4-6-2/6ystolic murmur best at LUSB with radiation to  carotids) heard. Pulmonary/Chest: Effort normal and breath sounds normal. No respiratory distress. He has no wheezes. He has no rales.  Abdominal: Soft. Bowel sounds are normal. He exhibits no distension. There is no tenderness. There is no rebound and no guarding.  Musculoskeletal: He exhibits no edema.  Skin: Skin is warm and dry. No rash noted.  Psychiatric: He has a normal mood and affect.  Nursing note and vitals reviewed.     Assessment &  Plan:   Problem List Items Addressed This Visit    Atrial fibrillation    Not on anticoagulation per pt decision. Sounds regular today.      Relevant Medications   enalapril (VASOTEC) 10 MG tablet   Chronic diastolic heart failure    Sees euvolemic today. Await BNP.      Relevant Medications   enalapril (VASOTEC) 10 MG tablet   CKD stage 3 due to type 2 diabetes mellitus   Relevant Medications   enalapril (VASOTEC) 10 MG tablet   Other Relevant Orders   Renal function panel   Essential hypertension    Stable on current regimen. Continue.      Relevant Medications   enalapril (VASOTEC) 10 MG tablet   Hyperkalemia    Previously ACEI related - resolved off acei. Pt has restarted enalapril 2/2 high blood pressures. check Cr/K today.      Moderate to severe aortic stenosis - Primary    Concern for worsening aortic stenosis - will check BNP and update echocardiogram (last 08/2013). ?propranolo related worsening - so will stop this medication for now as well.  Will have pt return to see Dr Lovena Le as overdue.  Discussed reasons to seek ER care.      Relevant Medications   enalapril (VASOTEC) 10 MG tablet   Other Relevant Orders   Ambulatory referral to Cardiology   Brain natriuretic peptide   Echocardiogram   Tremor    Thought essential tremor - propranolol 26m once daily significantly helped, but will stop beta blocker with concern for new symptomatic aortic stenosis - and instead trial gabapentin [id number] to bid prn tremor. Pt agrees       Other Visit Diagnoses    Dyspnea        Relevant Orders    Ambulatory referral to Cardiology    Brain natriuretic peptide    Echocardiogram        Follow up plan: Return if symptoms worsen or fail to improve.

## 2014-10-22 NOTE — Assessment & Plan Note (Addendum)
Sees euvolemic today. Await BNP.

## 2014-10-22 NOTE — Assessment & Plan Note (Addendum)
Not on anticoagulation per pt decision. Sounds regular today.

## 2014-10-22 NOTE — Assessment & Plan Note (Signed)
Previously ACEI related - resolved off acei. Pt has restarted enalapril 2/2 high blood pressures. check Cr/K today.

## 2014-10-22 NOTE — Assessment & Plan Note (Signed)
Stable on current regimen. Continue. ?

## 2014-10-23 ENCOUNTER — Ambulatory Visit (HOSPITAL_COMMUNITY)
Admission: RE | Admit: 2014-10-23 | Discharge: 2014-10-23 | Disposition: A | Discharge status: Home / Self Care | Payer: PPO | Source: Ambulatory Visit | Attending: Cardiovascular Disease | Admitting: Cardiovascular Disease

## 2014-10-23 DIAGNOSIS — E1122 Type 2 diabetes mellitus with diabetic chronic kidney disease: Secondary | ICD-10-CM | POA: Diagnosis not present

## 2014-10-23 DIAGNOSIS — R0600 Dyspnea, unspecified: Secondary | ICD-10-CM | POA: Diagnosis not present

## 2014-10-23 DIAGNOSIS — I4891 Unspecified atrial fibrillation: Secondary | ICD-10-CM | POA: Diagnosis not present

## 2014-10-23 DIAGNOSIS — I129 Hypertensive chronic kidney disease with stage 1 through stage 4 chronic kidney disease, or unspecified chronic kidney disease: Secondary | ICD-10-CM | POA: Diagnosis not present

## 2014-10-23 DIAGNOSIS — I509 Heart failure, unspecified: Secondary | ICD-10-CM | POA: Diagnosis not present

## 2014-10-23 DIAGNOSIS — N189 Chronic kidney disease, unspecified: Secondary | ICD-10-CM | POA: Diagnosis not present

## 2014-10-23 DIAGNOSIS — I350 Nonrheumatic aortic (valve) stenosis: Secondary | ICD-10-CM

## 2014-10-23 LAB — RENAL FUNCTION PANEL
Albumin: 4.1 g/dL (ref 3.5–5.2)
BUN: 29 mg/dL — ABNORMAL HIGH (ref 6–23)
CALCIUM: 9.9 mg/dL (ref 8.4–10.5)
CHLORIDE: 106 meq/L (ref 96–112)
CO2: 25 mEq/L (ref 19–32)
Creatinine, Ser: 1.73 mg/dL — ABNORMAL HIGH (ref 0.40–1.50)
GFR: 41.02 mL/min — ABNORMAL LOW (ref >60.00)
Glucose, Bld: 71 mg/dL (ref 70–99)
PHOSPHORUS: 3.1 mg/dL (ref 2.3–4.6)
Potassium: 4.9 mEq/L (ref 3.5–5.1)
Sodium: 139 mEq/L (ref 135–145)

## 2014-10-23 LAB — BRAIN NATRIURETIC PEPTIDE: Pro B Natriuretic peptide (BNP): 540 pg/mL — ABNORMAL HIGH (ref 0.0–100.0)

## 2014-10-23 MED ORDER — PERFLUTREN LIPID MICROSPHERE
1.0000 mL | Freq: Once | INTRAVENOUS | Status: AC
  Administered 2014-10-23: 1 mL via INTRAVENOUS

## 2014-10-26 ENCOUNTER — Telehealth: Payer: Self-pay | Admitting: *Deleted

## 2014-10-26 ENCOUNTER — Telehealth: Payer: Self-pay | Admitting: Internal Medicine

## 2014-10-26 NOTE — Telephone Encounter (Signed)
pt to call back with fm hx info.Marland KitchenMarland Kitchen

## 2014-10-26 NOTE — Telephone Encounter (Signed)
Follow Up   Returning call from earlier. Please call back.

## 2014-10-27 ENCOUNTER — Encounter: Payer: Self-pay | Admitting: Internal Medicine

## 2014-10-27 ENCOUNTER — Ambulatory Visit (INDEPENDENT_AMBULATORY_CARE_PROVIDER_SITE_OTHER): Payer: PPO | Admitting: Internal Medicine

## 2014-10-27 VITALS — BP 124/68 | HR 78 | Ht 73.0 in | Wt 272.8 lb

## 2014-10-27 DIAGNOSIS — I10 Essential (primary) hypertension: Secondary | ICD-10-CM

## 2014-10-27 DIAGNOSIS — I482 Chronic atrial fibrillation, unspecified: Secondary | ICD-10-CM

## 2014-10-27 DIAGNOSIS — R0600 Dyspnea, unspecified: Secondary | ICD-10-CM | POA: Diagnosis not present

## 2014-10-27 DIAGNOSIS — I5032 Chronic diastolic (congestive) heart failure: Secondary | ICD-10-CM

## 2014-10-27 MED ORDER — FUROSEMIDE 20 MG PO TABS: 40.0000 mg | ORAL_TABLET | Freq: Every day | ORAL | Status: DC

## 2014-10-27 NOTE — Patient Instructions (Signed)
Medication Instructions:  Your physician has recommended you make the following change in your medication:  1) INCREASE Lasix to 40 mg (2 tablets) by mouth daily   Labwork: Your physician recommends that you return for lab work in: 2 weeks (BMET) - RX provided to get at [facility] week of June 20th.   Testing/Procedures: NONE  Follow-Up: Your physician recommends that you schedule a follow-up appointment in: 3 months with Dr. Lovena Le.   Any Other Special Instructions Will Be Listed Below (If Applicable).

## 2014-10-27 NOTE — Assessment & Plan Note (Signed)
His ventricular rate is well controlled. He will continue his current meds. He continues to refuse coumadin or a NOAC.

## 2014-10-27 NOTE — Progress Notes (Signed)
HPI Bill [name] returns today for EP followup. He is a morbidly obese man who has developed worsening sob. He has class 3A symptoms and recent 2D echo demonstrated that the patient had mod-severe aortic stenosis and mild mitral regurgitation. He has preserved LV function. He has had atrial fibrillation and previously refused anti-coagulation except for ASA. He had hip replacement surgery and feels well with no residual hip pain.  Allergies  Allergen Reactions  . Ace Inhibitors Other (See Comments)    hyperkalemia     Current Outpatient Prescriptions  Medication Sig Dispense Refill  . Blood Glucose Monitoring Suppl (ONE TOUCH ULTRA MINI) W/DEVICE KIT Check blood sugar once a day and as needed. E11.319. 1 each 0  . cholecalciferol (VITAMIN D) 1000 UNITS tablet Take 1,000 Units by mouth daily.    . enalapril (VASOTEC) 10 MG tablet Take 10 mg by mouth daily.    . furosemide (LASIX) 20 MG tablet Take 1 tablet (20 mg total) by mouth daily. 90 tablet 3  . gabapentin (NEURONTIN) 100 MG capsule Take 1 capsule (100 mg total) by mouth 2 (two) times daily. 60 capsule 3  . glipiZIDE (GLUCOTROL) 10 MG tablet Take 1 tablet (10 mg total) by mouth daily before breakfast. 90 tablet 3  . ibuprofen (ADVIL,MOTRIN) 800 MG tablet TAKE 1 TABLET TWICE DAILY AS NEEDED  FOR  PAIN (Patient taking differently: TAKE 1/2 TABLET TWICE DAILY AS NEEDED  FOR  PAIN) 180 tablet 3  . Lancets MISC To use to check blood sugar once daily  E11.9 100 each 11  . metFORMIN (GLUCOPHAGE) 500 MG tablet Take 1 tablet (500 mg total) by mouth 2 (two) times daily with a meal. 180 tablet 3  . Multiple Vitamins-Minerals (MULTIVITAMIN PO) Multivitamin packet containing 5 tablets.    Bill [name] DELICA LANCETS FINE MISC check blood sugar once a day and as needed. E11.319 100 each 3  . oxybutynin (DITROPAN) 5 MG tablet Take 1 tablet (5 mg total) by mouth 3 (three) times daily. 270 tablet 3  . vitamin B-12 (CYANOCOBALAMIN) 1000 MCG tablet  Take 1,000 mcg by mouth daily.     No current facility-administered medications for this visit.     Past Medical History  Diagnosis Date  . Hypertension   . Type 2 diabetes, controlled, with retinopathy 1980s  . Exogenous obesity   . Urge incontinence   . Lyme disease 2012    ?titers negative  . History of chicken pox   . Aortic stenosis 12/2010    a. Echo 12/29/10: Mild LVH, EF 50-55%, normal wall motion, mild aortic stenosis, trivial AI, mean gradient 19 mmHg, mild LAE;  b. Echo 11/13:  mod LVH, vigorous LV EF 65-70%, mod AS, mean 23 mmHg, mild MR, mild BAE, mild RVE  c. echo 08/2013 with mod-severe AS  . Kidney stones remote  . GERD (gastroesophageal reflux disease)     occasional  . Atrial fibrillation   . Vitamin B12 deficiency     IF normal (2014)    ROS:   All systems reviewed and negative except as noted in the HPI.   Past Surgical History  Procedure Laterality Date  . Kidney stone removal    . Finger sx    . US echocardiography  12/2010    EF 50-55%, mild LVH, normal wall motion, high LV filling pressures, mild AS, LA mildly dilated  . Back surgery  20 yrs ago    removal of lower back cartilage due  to damage. Patient does not remember date of surgery.  . Total hip arthroplasty  03/12/2012    RIGHT TOTAL HIP ARTHROPLASTY ANTERIOR APPROACH;  Surgeon: Bill Rossetti, MD;  . Cataract extraction Bilateral 2015    Bill [name] and Bill [name]     Family History  Problem Relation Age of Onset  . Diabetes Father   . Diabetes Mother   . Heart disease Father   . Heart disease Mother   . Cancer Brother     throat     History   Social History  . Marital Status: Married    Spouse Name: N/A  . Number of Children: N/A  . Years of Education: N/A   Occupational History  . Not on file.   Social History Main Topics  . Smoking status: Never Smoker   . Smokeless tobacco: Never Used  . Alcohol Use: Yes     Comment: Regular (bourbon and coke 3-4/day)  . Drug  Use: No  . Sexual Activity: Not on file   Other Topics Concern  . Not on file   Social History Narrative   Caffeine: coffee 1 cup/day   Lives with wife Bill [name]), 1 cat   Occupation: retired, worked for [employer]   Edu: BS   Activity: no regular exercise   Diet: some water, fruits/vegetables daily      Advanced directives: has living will at home. Does not want prolonged life support "no extraordinary measures" but would accept temporary measures. Would want wife to be HCPOA.      BP 124/68 mmHg  Pulse 78  Ht 6' 1"  (1.854 m)  Wt 272 lb 12.8 oz (123.741 kg)  BMI 36.00 kg/m2  Physical Exam:  Well appearing 74 yo man, NAD HEENT: Unremarkable Neck:  No JVD, no thyromegally Back:  No CVA tenderness Lungs:  Scattered rales with no wheezes HEART:  IRegular rate rhythm, no murmurs, no rubs, no clicks Abd:  soft, positive bowel sounds, no organomegally, no rebound, no guarding Ext:  2 plus pulses, no edema, no cyanosis, no clubbing Skin:  No rashes no nodules Neuro:  CN II through XII intact, motor grossly intact  EKG - atrial fib with a controlled VR   Assess/Plan:

## 2014-10-27 NOTE — Telephone Encounter (Signed)
New Message  Pt called back. Returning CMA's Brandy's call from 10/26/2014. Please assist

## 2014-10-27 NOTE — Assessment & Plan Note (Signed)
His blood pressure is well-controlled 

## 2014-10-27 NOTE — Telephone Encounter (Signed)
Will forward to [facility] for f/u

## 2014-10-27 NOTE — Assessment & Plan Note (Signed)
His fluid status is elevated. I have asked him to increase his lasix. Will follow.

## 2014-11-06 ENCOUNTER — Other Ambulatory Visit: Payer: Self-pay | Admitting: *Deleted

## 2014-11-06 ENCOUNTER — Telehealth: Payer: Self-pay | Admitting: Internal Medicine

## 2014-11-06 DIAGNOSIS — R0600 Dyspnea, unspecified: Secondary | ICD-10-CM

## 2014-11-06 MED ORDER — FUROSEMIDE 20 MG PO TABS: 40.0000 mg | ORAL_TABLET | Freq: Every day | ORAL | Status: DC

## 2014-11-06 NOTE — Telephone Encounter (Signed)
pt was in last week and got an new rx for Furosamide 40 mg -it was called into the wrong pharmacy-need called to [facility] @ [phone]

## 2014-11-12 ENCOUNTER — Other Ambulatory Visit (INDEPENDENT_AMBULATORY_CARE_PROVIDER_SITE_OTHER): Payer: PPO

## 2014-11-12 DIAGNOSIS — I10 Essential (primary) hypertension: Secondary | ICD-10-CM | POA: Diagnosis not present

## 2014-11-12 DIAGNOSIS — I482 Chronic atrial fibrillation, unspecified: Secondary | ICD-10-CM

## 2014-11-12 LAB — BASIC METABOLIC PANEL
BUN: 31 mg/dL — ABNORMAL HIGH (ref 6–23)
CALCIUM: 10.4 mg/dL (ref 8.4–10.5)
CHLORIDE: 104 meq/L (ref 96–112)
CO2: 25 meq/L (ref 19–32)
Creatinine, Ser: 1.58 mg/dL — ABNORMAL HIGH (ref 0.40–1.50)
GFR: 45.54 mL/min — ABNORMAL LOW (ref >60.00)
GLUCOSE: 90 mg/dL (ref 70–99)
Potassium: 4.7 mEq/L (ref 3.5–5.1)
Sodium: 137 mEq/L (ref 135–145)

## 2014-11-16 ENCOUNTER — Other Ambulatory Visit: Payer: Self-pay

## 2014-11-16 MED ORDER — ENALAPRIL MALEATE 10 MG PO TABS: 10.0000 mg | ORAL_TABLET | Freq: Every day | ORAL | Status: DC

## 2014-11-16 NOTE — Telephone Encounter (Signed)
Pt left v/m requesting refill en[facility]april to [facility]. Pt advised done per protocol.

## 2014-12-21 LAB — HM DIABETES EYE EXAM

## 2015-01-01 ENCOUNTER — Telehealth: Payer: Self-pay

## 2015-01-01 MED ORDER — IBUPROFEN 400 MG PO TABS: 400.0000 mg | ORAL_TABLET | Freq: Two times a day (BID) | ORAL | Status: DC | PRN

## 2015-01-01 NOTE — Telephone Encounter (Signed)
Pt.notified

## 2015-01-01 NOTE — Telephone Encounter (Signed)
plz notify sent in. 

## 2015-01-01 NOTE — Telephone Encounter (Signed)
Pt left v/m requesting new rx for ibuprofen 400 mg [facility]. Pt does not want to cut 800 mg in half. Please advise. Pt last seen 10/22/14.

## 2015-01-27 ENCOUNTER — Encounter: Payer: Self-pay | Admitting: Internal Medicine

## 2015-01-27 ENCOUNTER — Ambulatory Visit (INDEPENDENT_AMBULATORY_CARE_PROVIDER_SITE_OTHER): Payer: PPO | Admitting: Internal Medicine

## 2015-01-27 VITALS — BP 118/78 | HR 66 | Ht 74.0 in | Wt 269.0 lb

## 2015-01-27 DIAGNOSIS — I10 Essential (primary) hypertension: Secondary | ICD-10-CM

## 2015-01-27 DIAGNOSIS — I482 Chronic atrial fibrillation, unspecified: Secondary | ICD-10-CM

## 2015-01-27 DIAGNOSIS — Z01812 Encounter for preprocedural laboratory examination: Secondary | ICD-10-CM | POA: Diagnosis not present

## 2015-01-27 MED ORDER — APIXABAN 5 MG PO TABS: 5.0000 mg | ORAL_TABLET | Freq: Two times a day (BID) | ORAL | Status: DC

## 2015-01-27 NOTE — Assessment & Plan Note (Signed)
His blood pressure is well controlled. I have encouraged the patient to maintain a low sodium diet.

## 2015-01-27 NOTE — Patient Instructions (Addendum)
Medication Instructions:  Your physician has recommended you make the following change in your medication:  1) START Eliquis 5 mg twice a day  Labwork: None ordered  Testing/Procedures: You will be contacted by our pharmacist, Gay Filler, to arrange appointment to discuss/review Tikosyn admission  Follow-Up: To be determined after Tikosyn initiation  Any Other Special Instructions Will Be Listed Below (If Applicable). Tikosyn admission will not occur until you have been on the blood thinner for 3 weeks.  Thank you for choosing [facility]!!

## 2015-01-27 NOTE — Assessment & Plan Note (Signed)
His symptoms are well controlled. He will try to maintain a low sodium diet. Will continue his diuretic.

## 2015-01-27 NOTE — Progress Notes (Signed)
HPI [name] returns today for EP followup. He is a morbidly obese man who has developed worsening sob. He has class 3A symptoms and recent 2D echo demonstrated that the patient had mod-severe aortic stenosis and mild mitral regurgitation. He has preserved LV function. He has had atrial fibrillation and previously refused anti-coagulation except for ASA. He had hip replacement surgery and feels well with no residual hip pain. When I saw him 3 months ago, his volume status was increased and I asked him to uptitrate his diuretic. He still feels sob with class 3 symptoms.  Allergies  Allergen Reactions  . Ace Inhibitors Other (See Comments)    hyperkalemia     Current Outpatient Prescriptions  Medication Sig Dispense Refill  . Blood Glucose Monitoring Suppl (ONE TOUCH ULTRA MINI) W/DEVICE KIT Check blood sugar once a day and as needed. E11.319. 1 each 0  . cholecalciferol (VITAMIN D) 1000 UNITS tablet Take 1,000 Units by mouth daily.    . enalapril (VASOTEC) 10 MG tablet Take 1 tablet (10 mg total) by mouth daily. 90 tablet 1  . furosemide (LASIX) 20 MG tablet Take 2 tablets (40 mg total) by mouth daily. 180 tablet 3  . gabapentin (NEURONTIN) 100 MG capsule Take 1 capsule (100 mg total) by mouth 2 (two) times daily. 60 capsule 3  . glipiZIDE (GLUCOTROL) 10 MG tablet Take 1 tablet (10 mg total) by mouth daily before breakfast. 90 tablet 3  . ibuprofen (ADVIL,MOTRIN) 400 MG tablet Take 1 tablet (400 mg total) by mouth 2 (two) times daily as needed. 180 tablet 1  . Lancets MISC To use to check blood sugar once daily  E11.9 100 each 11  . metFORMIN (GLUCOPHAGE) 500 MG tablet Take 1 tablet (500 mg total) by mouth 2 (two) times daily with a meal. 180 tablet 3  . Multiple Vitamins-Minerals (MULTIVITAMIN PO) Multivitamin packet containing 5 tablets.    Bill [name] DELICA LANCETS FINE MISC check blood sugar once a day and as needed. E11.319 100 each 3  . oxybutynin (DITROPAN) 5 MG tablet Take 1  tablet (5 mg total) by mouth 3 (three) times daily. 270 tablet 3  . vitamin B-12 (CYANOCOBALAMIN) 1000 MCG tablet Take 1,000 mcg by mouth daily.     No current facility-administered medications for this visit.     Past Medical History  Diagnosis Date  . Hypertension   . Type 2 diabetes, controlled, with retinopathy 1980s  . Exogenous obesity   . Urge incontinence   . Lyme disease 2012    ?titers negative  . History of chicken pox   . Aortic stenosis 12/2010    a. Echo 12/29/10: Mild LVH, EF 50-55%, normal wall motion, mild aortic stenosis, trivial AI, mean gradient 19 mmHg, mild LAE;  b. Echo 11/13:  mod LVH, vigorous LV EF 65-70%, mod AS, mean 23 mmHg, mild MR, mild BAE, mild RVE  c. echo 08/2013 with mod-severe AS  . Kidney stones remote  . GERD (gastroesophageal reflux disease)     occasional  . Atrial fibrillation   . Vitamin B12 deficiency     IF normal (2014)    ROS:   All systems reviewed and negative except as noted in the HPI.   Past Surgical History  Procedure Laterality Date  . Kidney stone removal    . Finger sx    . US echocardiography  12/2010    EF 50-55%, mild LVH, normal wall motion, high LV filling pressures, mild  AS, LA mildly dilated  . Back surgery  20 yrs ago    removal of lower back cartilage due to damage. Patient does not remember date of surgery.  . Total hip arthroplasty  03/12/2012    RIGHT TOTAL HIP ARTHROPLASTY ANTERIOR APPROACH;  Surgeon: Bill Rossetti, MD;  . Cataract extraction Bilateral 2015    Bill [name]     Family History  Problem Relation Age of Onset  . Diabetes Father   . Diabetes Mother   . Heart disease Father   . Heart disease Mother   . Cancer Brother     throat     Social History   Social History  . Marital Status: Married    Spouse Name: N/A  . Number of Children: N/A  . Years of Education: N/A   Occupational History  . Not on file.   Social History Main Topics  . Smoking status: Never  Smoker   . Smokeless tobacco: Never Used  . Alcohol Use: Yes     Comment: Regular (bourbon and coke 3-4/day)  . Drug Use: No  . Sexual Activity: Not on file   Other Topics Concern  . Not on file   Social History Narrative   Caffeine: coffee 1 cup/day   Lives with wife Bill [name]), 1 cat   Occupation: retired, worked for [employer]   Edu: BS   Activity: no regular exercise   Diet: some water, fruits/vegetables daily      Advanced directives: has living will at home. Does not want prolonged life support "no extraordinary measures" but would accept temporary measures. Would want wife to be HCPOA.      There were no vitals taken for this visit.  Physical Exam:  Well appearing 74 yo man, NAD HEENT: Unremarkable Neck:  7 JVD, no thyromegally Back:  No CVA tenderness Lungs:  Scattered rales with no wheezes HEART:  IRegular rate rhythm, no murmurs, no rubs, no clicks Abd:  soft, positive bowel sounds, no organomegally, no rebound, no guarding Ext:  2 plus pulses, trace peripheral edema, no cyanosis, no clubbing Skin:  No rashes no nodules Neuro:  CN II through XII intact, motor grossly intact  EKG - atrial fib with a controlled VR   Assess/Plan:

## 2015-01-27 NOTE — Assessment & Plan Note (Signed)
He does not have surgical disease yet. Will follow.

## 2015-01-27 NOTE — Assessment & Plan Note (Signed)
We discussed the treatment options. He is willing to take a blood thinner for 3 weeks before and after initiation of Tikosyn.. After that he will decide on a his own treatment though I have strongly recommended he remain on a blood thinner. Will schedule admit for Tikosyn in 3-4 weeks.

## 2015-02-18 ENCOUNTER — Telehealth: Payer: Self-pay | Admitting: Internal Medicine

## 2015-02-18 NOTE — Telephone Encounter (Signed)
Returned patient's call and informed him that it is ok for him to get the flu shot even with Tikosyn. He expressed understanding.

## 2015-02-18 NOTE — Telephone Encounter (Signed)
New messsage  Pt has a tikosyn appt on 10/04 request a call back to discuss if he can receive a flu shot or will it interfere.

## 2015-02-19 ENCOUNTER — Encounter: Payer: Self-pay | Admitting: Family Medicine

## 2015-02-19 ENCOUNTER — Ambulatory Visit (INDEPENDENT_AMBULATORY_CARE_PROVIDER_SITE_OTHER): Payer: PPO | Admitting: Family Medicine

## 2015-02-19 ENCOUNTER — Ambulatory Visit: Payer: PPO

## 2015-02-19 VITALS — BP 110/64 | HR 68 | Temp 97.4°F | Wt 259.2 lb

## 2015-02-19 DIAGNOSIS — N183 Chronic kidney disease, stage 3 (moderate): Secondary | ICD-10-CM

## 2015-02-19 DIAGNOSIS — Z23 Encounter for immunization: Secondary | ICD-10-CM | POA: Diagnosis not present

## 2015-02-19 DIAGNOSIS — E1121 Type 2 diabetes mellitus with diabetic nephropathy: Secondary | ICD-10-CM

## 2015-02-19 DIAGNOSIS — R251 Tremor, unspecified: Secondary | ICD-10-CM

## 2015-02-19 DIAGNOSIS — E669 Obesity, unspecified: Secondary | ICD-10-CM

## 2015-02-19 DIAGNOSIS — E11319 Type 2 diabetes mellitus with unspecified diabetic retinopathy without macular edema: Secondary | ICD-10-CM | POA: Diagnosis not present

## 2015-02-19 DIAGNOSIS — E1122 Type 2 diabetes mellitus with diabetic chronic kidney disease: Secondary | ICD-10-CM

## 2015-02-19 DIAGNOSIS — I482 Chronic atrial fibrillation, unspecified: Secondary | ICD-10-CM

## 2015-02-19 DIAGNOSIS — E1142 Type 2 diabetes mellitus with diabetic polyneuropathy: Secondary | ICD-10-CM

## 2015-02-19 DIAGNOSIS — I10 Essential (primary) hypertension: Secondary | ICD-10-CM

## 2015-02-19 DIAGNOSIS — I350 Nonrheumatic aortic (valve) stenosis: Secondary | ICD-10-CM

## 2015-02-19 DIAGNOSIS — E875 Hyperkalemia: Secondary | ICD-10-CM

## 2015-02-19 DIAGNOSIS — G629 Polyneuropathy, unspecified: Secondary | ICD-10-CM

## 2015-02-19 LAB — COMPREHENSIVE METABOLIC PANEL
ALK PHOS: 48 U/L (ref 39–117)
ALT: 18 U/L (ref 0–53)
AST: 26 U/L (ref 0–37)
Albumin: 4.1 g/dL (ref 3.5–5.2)
BUN: 48 mg/dL — AB (ref 6–23)
CHLORIDE: 101 meq/L (ref 96–112)
CO2: 26 mEq/L (ref 19–32)
CREATININE: 2.29 mg/dL — AB (ref 0.40–1.50)
Calcium: 10.3 mg/dL (ref 8.4–10.5)
GFR: 29.66 mL/min — ABNORMAL LOW (ref >60.00)
GLUCOSE: 139 mg/dL — AB (ref 70–99)
POTASSIUM: 4.4 meq/L (ref 3.5–5.1)
SODIUM: 136 meq/L (ref 135–145)
TOTAL PROTEIN: 7.6 g/dL (ref 6.0–8.3)
Total Bilirubin: 0.6 mg/dL (ref 0.2–1.2)

## 2015-02-19 LAB — HEMOGLOBIN A1C: HEMOGLOBIN A1C: 6.6 % — AB (ref 4.6–6.5)

## 2015-02-19 NOTE — Progress Notes (Addendum)
BP 110/64 mmHg  Pulse 68  Temp(Src) 97.4 F (36.3 C) (Oral)  Wt 259 lb 4 oz (117.595 kg)   CC: 6 mo f/u visit  Subjective:    Patient ID: Bill [name], male    DOB: Jul 28, 1940, 74 y.o.   MRN: [id number]  HPI: Bill [name] is a 74 y.o. male presenting on 02/19/2015 for Follow-up   Pleasant 73yo with known hypertension, diabetes 2, obesity, and aortic stenosis and chronic atrial fibrillation on eliquis since 01/2015 discussing with cards tikosyn admission next week.   Chronic hyperkalemia from ace inhibitors so we stopped enalapril. However, blood pressures were running elevated (198/140) so he has restarted enalapril 30m daily since early May. bp much better controlled - also lasix 472mdaily was started due to diastolic CHF.  Propranolol 206mn am was started 08/2014 for tremors - this had helped significantly but with concern for symptomatic aortic stenosis this was stopped. Gabapentin trial - did not see significant improvement.   DM - on metformin 500m76mD (recent decrease from 1000mg43m). Sugars running 150s but run 100s when he increases metformin to 1000mg 57m 10lb weight loss in last month - decreased portion sizes. States last eye exam was last month, got stable report. Diabetic Foot Exam - Simple   Simple Foot Form  Diabetic Foot exam was performed with the following findings:  Yes 02/19/2015  1:20 PM  Visual Inspection  No deformities, no ulcerations, no other skin breakdown bilaterally:  Yes  Sensation Testing  See comments:  Yes  Pulse Check  Posterior Tibialis and Dorsalis pulse intact bilaterally:  Yes  Comments  Mildly diminished to monofilament testing.  Scabbed abrasions L dorsal foot       Flu shot today.  Relevant past medical, surgical, family and social history reviewed and updated as indicated. Interim medical history since our last visit reviewed. Allergies and medications reviewed and updated. Current Outpatient Prescriptions on File Prior to  Visit  Medication Sig  . apixaban (ELIQUIS) 5 MG TABS tablet Take 1 tablet (5 mg total) by mouth 2 (two) times daily.  . Blood Glucose Monitoring Suppl (ONE TOUCH ULTRA MINI) W/DEVICE KIT Check blood sugar once a day and as needed. E11.319.  . cholecalciferol (VITAMIN D) 1000 UNITS tablet Take 1,000 Units by mouth daily.  . enalapril (VASOTEC) 10 MG tablet Take 1 tablet (10 mg total) by mouth daily.  . furosemide (LASIX) 20 MG tablet Take 2 tablets (40 mg total) by mouth daily.  . glipMarland KitchenZIDE (GLUCOTROL) 10 MG tablet Take 1 tablet (10 mg total) by mouth daily before breakfast.  . ibuprofen (ADVIL,MOTRIN) 400 MG tablet Take 1 tablet (400 mg total) by mouth 2 (two) times daily as needed.  . Multiple Vitamins-Minerals (MULTIVITAMIN PO) Multivitamin packet containing 5 tablets.  . oxybMarland Kitchentynin (DITROPAN) 5 MG tablet Take 1 tablet (5 mg total) by mouth 3 (three) times daily.  . vitamin B-12 (CYANOCOBALAMIN) 1000 MCG tablet Take 1,000 mcg by mouth daily.   No current facility-administered medications on file prior to visit.    Review of Systems Per HPI unless specifically indicated above     Objective:    BP 110/64 mmHg  Pulse 68  Temp(Src) 97.4 F (36.3 C) (Oral)  Wt 259 lb 4 oz (117.595 kg)  Wt Readings from Last 3 Encounters:  02/19/15 259 lb 4 oz (117.595 kg)  01/27/15 269 lb (122.018 kg)  10/27/14 272 lb 12.8 oz (123.741 kg)   Body mass index is 33.27  kg/(m^2).  Physical Exam  Constitutional: He appears well-developed and well-nourished. No distress.  HENT:  Head: Normocephalic and atraumatic.  Right Ear: External ear normal.  Left Ear: External ear normal.  Nose: Nose normal.  Mouth/Throat: Oropharynx is clear and moist. No oropharyngeal exudate.  Eyes: Conjunctivae and EOM are normal. Pupils are equal, round, and reactive to light. No scleral icterus.  Neck: Normal range of motion. Neck supple.  Cardiovascular: Normal rate, regular rhythm and intact distal pulses.   Murmur  (3/6 SEM) heard. Pulmonary/Chest: Effort normal and breath sounds normal. No respiratory distress. He has no wheezes. He has no rales.  Musculoskeletal: He exhibits no edema.  See HPI for foot exam if done  Lymphadenopathy:    He has no cervical adenopathy.  Skin: Skin is warm and dry. No rash noted.  Psychiatric: He has a normal mood and affect.  Nursing note and vitals reviewed.  Results for orders placed or performed in visit on 02/19/15  HM DIABETES EYE EXAM  Result Value Ref Range   HM Diabetic Eye Exam No Retinopathy No Retinopathy   Lab Results  Component Value Date   HGBA1C 6.5 09/10/2014       Assessment & Plan:   Problem List Items Addressed This Visit    Type 2 diabetes, controlled, with retinopathy - Primary    Chronic, stable. Recheck A1c on lower metformin dose (although he has made effort to lose weight and succeeded). Pt states retinopathy is stable s/p laser treatment.       Relevant Medications   metFORMIN (GLUCOPHAGE) 500 MG tablet   Other Relevant Orders   Hemoglobin A1c   Comprehensive metabolic panel   Tremor    Relative contraindication to propranolol given aortic stenosis. Low dose gabapentin has been ineffective - will stop this as well.      Obesity, Class I, BMI 30-34.9    Congratulated on weight loss to date, encouraged continued attempts at weight loss. Body mass index is 33.27 kg/(m^2).       Relevant Medications   metFORMIN (GLUCOPHAGE) 500 MG tablet   Moderate to severe aortic stenosis    Followed by cards.      Hyperkalemia    H/o chronic hyperkalemia to ACEI, recheck today. Now on lasix 23m daily.      Essential hypertension    Chronic, stable. Continue current regimen.      Diabetic peripheral neuropathy associated with type 2 diabetes mellitus    Very mild on exam today. Gabapentin not helpful for tremors - will stop.      Relevant Medications   metFORMIN (GLUCOPHAGE) 500 MG tablet   Diabetic nephropathy associated  with type 2 diabetes mellitus    See below.      Relevant Medications   metFORMIN (GLUCOPHAGE) 500 MG tablet   CKD stage 3 due to type 2 diabetes mellitus    Chronic. Recheck Cr today now that he's regularly on lasix. Discussed metformin use in setting of CKD. Pt limits ibuprofen use.      Relevant Medications   metFORMIN (GLUCOPHAGE) 500 MG tablet   Atrial fibrillation    Has been on eliquis for last 3 weeks. Pending tikosyn hospitalization next week by cards.        Other Visit Diagnoses    Need for influenza vaccination        Relevant Orders    Flu Vaccine QUAD 36+ mos PF IM (Fluarix & Fluzone Quad PF) (Completed)  Follow up plan: Return in about 7 months (around 09/19/2015), or as needed, for medicare wellness visit.

## 2015-02-19 NOTE — Assessment & Plan Note (Signed)
Chronic. Recheck Cr today now that he's regularly on lasix. Discussed metformin use in setting of CKD. Pt limits ibuprofen use.

## 2015-02-19 NOTE — Assessment & Plan Note (Signed)
Congratulated on weight loss to date, encouraged continued attempts at weight loss. Body mass index is 33.27 kg/(m^2).

## 2015-02-19 NOTE — Assessment & Plan Note (Signed)
Has been on eliquis for last 3 weeks. Pending tikosyn hospitalization next week by cards.

## 2015-02-19 NOTE — Assessment & Plan Note (Signed)
Chronic, stable. Recheck A1c on lower metformin dose (although he has made effort to lose weight and succeeded). Pt states retinopathy is stable s/p laser treatment.

## 2015-02-19 NOTE — Assessment & Plan Note (Signed)
H/o chronic hyperkalemia to ACEI, recheck today. Now on lasix 40mg  daily.

## 2015-02-19 NOTE — Assessment & Plan Note (Signed)
Relative contraindication to propranolol given aortic stenosis. Low dose gabapentin has been ineffective - will stop this as well.

## 2015-02-19 NOTE — Patient Instructions (Addendum)
Labwork today. Flu shot today. Ok to stop gabapentin.  Continue metformin 500mg  twice daily for now. Return as needed or in 7 months for medicare wellness visit

## 2015-02-19 NOTE — Assessment & Plan Note (Signed)
See below

## 2015-02-19 NOTE — Assessment & Plan Note (Signed)
Followed by cards °

## 2015-02-19 NOTE — Assessment & Plan Note (Signed)
Chronic, stable. Continue current regimen. 

## 2015-02-19 NOTE — Progress Notes (Signed)
Pre visit review using our clinic review tool, if applicable. No additional management support is needed unless otherwise documented below in the visit note. 

## 2015-02-19 NOTE — Assessment & Plan Note (Signed)
Very mild on exam today. Gabapentin not helpful for tremors - will stop.

## 2015-02-20 ENCOUNTER — Other Ambulatory Visit: Payer: Self-pay | Admitting: Family Medicine

## 2015-02-20 DIAGNOSIS — N183 Chronic kidney disease, stage 3 (moderate): Principal | ICD-10-CM

## 2015-02-20 DIAGNOSIS — R0600 Dyspnea, unspecified: Secondary | ICD-10-CM

## 2015-02-20 DIAGNOSIS — I482 Chronic atrial fibrillation, unspecified: Secondary | ICD-10-CM

## 2015-02-20 DIAGNOSIS — E1122 Type 2 diabetes mellitus with diabetic chronic kidney disease: Secondary | ICD-10-CM

## 2015-02-23 ENCOUNTER — Ambulatory Visit (INDEPENDENT_AMBULATORY_CARE_PROVIDER_SITE_OTHER): Payer: PPO | Admitting: Pharmacist

## 2015-02-23 ENCOUNTER — Encounter (HOSPITAL_COMMUNITY): Payer: Self-pay

## 2015-02-23 ENCOUNTER — Inpatient Hospital Stay (HOSPITAL_COMMUNITY)
Admission: AD | Admit: 2015-02-23 | Discharge: 2015-02-26 | DRG: 310 | Disposition: A | Discharge status: Home / Self Care | Payer: PPO | Source: Ambulatory Visit | Attending: Internal Medicine | Admitting: Internal Medicine

## 2015-02-23 DIAGNOSIS — Z7984 Long term (current) use of oral hypoglycemic drugs: Secondary | ICD-10-CM

## 2015-02-23 DIAGNOSIS — Z8619 Personal history of other infectious and parasitic diseases: Secondary | ICD-10-CM

## 2015-02-23 DIAGNOSIS — I441 Atrioventricular block, second degree: Secondary | ICD-10-CM | POA: Diagnosis present

## 2015-02-23 DIAGNOSIS — Z7902 Long term (current) use of antithrombotics/antiplatelets: Secondary | ICD-10-CM | POA: Diagnosis not present

## 2015-02-23 DIAGNOSIS — Z6833 Body mass index (BMI) 33.0-33.9, adult: Secondary | ICD-10-CM

## 2015-02-23 DIAGNOSIS — Z87442 Personal history of urinary calculi: Secondary | ICD-10-CM

## 2015-02-23 DIAGNOSIS — I482 Chronic atrial fibrillation, unspecified: Secondary | ICD-10-CM

## 2015-02-23 DIAGNOSIS — I10 Essential (primary) hypertension: Secondary | ICD-10-CM | POA: Diagnosis present

## 2015-02-23 DIAGNOSIS — Z833 Family history of diabetes mellitus: Secondary | ICD-10-CM

## 2015-02-23 DIAGNOSIS — E119 Type 2 diabetes mellitus without complications: Secondary | ICD-10-CM | POA: Diagnosis present

## 2015-02-23 DIAGNOSIS — E538 Deficiency of other specified B group vitamins: Secondary | ICD-10-CM | POA: Diagnosis present

## 2015-02-23 DIAGNOSIS — K219 Gastro-esophageal reflux disease without esophagitis: Secondary | ICD-10-CM | POA: Diagnosis present

## 2015-02-23 DIAGNOSIS — Z96641 Presence of right artificial hip joint: Secondary | ICD-10-CM | POA: Diagnosis present

## 2015-02-23 DIAGNOSIS — I4891 Unspecified atrial fibrillation: Secondary | ICD-10-CM | POA: Diagnosis not present

## 2015-02-23 DIAGNOSIS — I350 Nonrheumatic aortic (valve) stenosis: Secondary | ICD-10-CM | POA: Diagnosis present

## 2015-02-23 DIAGNOSIS — Z8249 Family history of ischemic heart disease and other diseases of the circulatory system: Secondary | ICD-10-CM

## 2015-02-23 DIAGNOSIS — I517 Cardiomegaly: Secondary | ICD-10-CM | POA: Diagnosis not present

## 2015-02-23 DIAGNOSIS — I481 Persistent atrial fibrillation: Secondary | ICD-10-CM | POA: Diagnosis present

## 2015-02-23 DIAGNOSIS — E6609 Other obesity due to excess calories: Secondary | ICD-10-CM | POA: Diagnosis present

## 2015-02-23 DIAGNOSIS — R011 Cardiac murmur, unspecified: Secondary | ICD-10-CM | POA: Diagnosis present

## 2015-02-23 LAB — BASIC METABOLIC PANEL
BUN: 42 mg/dL — ABNORMAL HIGH (ref 7–25)
CALCIUM: 9.9 mg/dL (ref 8.6–10.3)
CO2: 22 mmol/L (ref 20–31)
CREATININE: 2.28 mg/dL — AB (ref 0.70–1.18)
Chloride: 102 mmol/L (ref 98–110)
GLUCOSE: 173 mg/dL — AB (ref 65–99)
Potassium: 5.1 mmol/L (ref 3.5–5.3)
SODIUM: 134 mmol/L — AB (ref 135–146)

## 2015-02-23 LAB — MAGNESIUM: Magnesium: 1.8 mg/dL (ref 1.5–2.5)

## 2015-02-23 MED ORDER — SODIUM CHLORIDE 0.9 % IJ SOLN: 3.0000 mL | INTRAMUSCULAR | Status: DC | PRN

## 2015-02-23 MED ORDER — OXYBUTYNIN CHLORIDE 5 MG PO TABS
5.0000 mg | ORAL_TABLET | Freq: Three times a day (TID) | ORAL | Status: DC
  Administered 2015-02-24 – 2015-02-26 (×6): 5 mg via ORAL
  Filled 2015-02-23 (×8): qty 1

## 2015-02-23 MED ORDER — SODIUM CHLORIDE 0.9 % IV SOLN
250.0000 mL | INTRAVENOUS | Status: DC | PRN
  Administered 2015-02-25: 11:00:00 via INTRAVENOUS
  Administered 2015-02-25: 250 mL via INTRAVENOUS

## 2015-02-23 MED ORDER — VITAMIN D 1000 UNITS PO TABS
1000.0000 [IU] | ORAL_TABLET | Freq: Every day | ORAL | Status: DC
  Administered 2015-02-24 – 2015-02-26 (×3): 1000 [IU] via ORAL
  Filled 2015-02-23 (×3): qty 1

## 2015-02-23 MED ORDER — DOFETILIDE 250 MCG PO CAPS
250.0000 ug | ORAL_CAPSULE | Freq: Two times a day (BID) | ORAL | Status: DC
  Administered 2015-02-23 – 2015-02-26 (×6): 250 ug via ORAL
  Filled 2015-02-23 (×6): qty 1

## 2015-02-23 MED ORDER — APIXABAN 5 MG PO TABS
5.0000 mg | ORAL_TABLET | Freq: Two times a day (BID) | ORAL | Status: DC
  Administered 2015-02-23 – 2015-02-26 (×6): 5 mg via ORAL
  Filled 2015-02-23 (×6): qty 1

## 2015-02-23 MED ORDER — SODIUM CHLORIDE 0.9 % IJ SOLN
3.0000 mL | Freq: Two times a day (BID) | INTRAMUSCULAR | Status: DC
  Administered 2015-02-24 – 2015-02-25 (×2): 3 mL via INTRAVENOUS

## 2015-02-23 MED ORDER — GLIPIZIDE 10 MG PO TABS
10.0000 mg | ORAL_TABLET | Freq: Every day | ORAL | Status: DC
  Administered 2015-02-24 – 2015-02-26 (×3): 10 mg via ORAL
  Filled 2015-02-23 (×3): qty 1

## 2015-02-23 MED ORDER — MAGNESIUM SULFATE IN D5W 10-5 MG/ML-% IV SOLN
1.0000 g | Freq: Once | INTRAVENOUS | Status: AC
  Administered 2015-02-23: 1 g via INTRAVENOUS
  Filled 2015-02-23: qty 100

## 2015-02-23 MED ORDER — METFORMIN HCL 500 MG PO TABS: 500.0000 mg | ORAL_TABLET | Freq: Two times a day (BID) | ORAL | Status: DC

## 2015-02-23 MED ORDER — ENALAPRIL MALEATE 5 MG PO TABS
5.0000 mg | ORAL_TABLET | Freq: Every day | ORAL | Status: DC
  Administered 2015-02-24 – 2015-02-26 (×3): 5 mg via ORAL
  Filled 2015-02-23 (×3): qty 1

## 2015-02-23 MED ORDER — FUROSEMIDE 20 MG PO TABS
20.0000 mg | ORAL_TABLET | Freq: Every day | ORAL | Status: DC
  Administered 2015-02-24 – 2015-02-26 (×3): 20 mg via ORAL
  Filled 2015-02-23 (×3): qty 1

## 2015-02-23 NOTE — H&P (Signed)
Patient ID: Bill [name] MRN: [id number], DOB/AGE: 30-Apr-1941   Admit date: 02/23/2015   Primary Physician: [name], MD Primary Cardiologist: Dr. Lovena Le  Pt. Profile:  74 y/o male with a h/o HTN, T2DM, aortic stenosis and atrial fibrillation, on Eliquis, admitted for Tikosyn loading.   Problem List  Past Medical History  Diagnosis Date  . Hypertension   . Type 2 diabetes, controlled, with retinopathy 1980s  . Exogenous obesity   . Urge incontinence   . Lyme disease 2012    ?titers negative  . History of chicken pox   . Aortic stenosis 12/2010    a. Echo 12/29/10: Mild LVH, EF 50-55%, normal wall motion, mild aortic stenosis, trivial AI, mean gradient 19 mmHg, mild LAE;  b. Echo 11/13:  mod LVH, vigorous LV EF 65-70%, mod AS, mean 23 mmHg, mild MR, mild BAE, mild RVE  c. echo 08/2013 with mod-severe AS  . Kidney stones remote  . GERD (gastroesophageal reflux disease)     occasional  . Atrial fibrillation   . Vitamin B12 deficiency     IF normal (2014)    Past Surgical History  Procedure Laterality Date  . Kidney stone removal    . Finger sx    . US echocardiography  12/2010    EF 50-55%, mild LVH, normal wall motion, high LV filling pressures, mild AS, LA mildly dilated  . Back surgery  20 yrs ago    removal of lower back cartilage due to damage. Patient does not remember date of surgery.  . Total hip arthroplasty  03/12/2012    RIGHT TOTAL HIP ARTHROPLASTY ANTERIOR APPROACH;  Surgeon: Mcarthur Rossetti, MD;  . Cataract extraction Bilateral 2015    [facility]     Allergies  No Active Allergies  HPI  The patient is a 74 y/o male with a PMH significant for HTN, T2DM, aortic stenosis and atrial fibrillation, on Eliquis, admitted for Tikosyn loading. He is followed in EP clinic by Dr. Lovena Le.  He is followed medically by Dr. Danise Mina. Recent 2D echo 10/23/14 demonstrated mod-severe aortic stenosis and mild mitral regurgitation. He has  preserved LV function with an EF of 60-65%. He has been symptomatic with is Afib, with DOE but no chest pain and no resting dyspnea. He has been fully compliant with Eliquis, w/o any missed doses. Denies any abnormal bleeding.   12 lead EKG in the office shows stable QTc of 393 ms . Telemetry shows atrial fibrillation with a CVR in the 60s. Mg is 1.8. K is 5.1. Cr Clearance (Cockcroft-Gault) is 46.1 mL/min.        Home Medications  Prior to Admission medications   Medication Sig Start Date End Date Taking? Authorizing Provider  apixaban (ELIQUIS) 5 MG TABS tablet Take 1 tablet (5 mg total) by mouth 2 (two) times daily. 02/20/15   [name], MD  Blood Glucose Monitoring Suppl (ONE TOUCH ULTRA MINI) W/DEVICE KIT Check blood sugar once a day and as needed. E11.319. 09/03/14   [name], MD  cholecalciferol (VITAMIN D) 1000 UNITS tablet Take 1,000 Units by mouth daily.    Historical Provider, MD  enalapril (VASOTEC) 10 MG tablet Take 0.5 tablets (5 mg total) by mouth daily. 02/20/15   [name], MD  furosemide (LASIX) 20 MG tablet Take 1 tablet (20 mg total) by mouth daily. 02/20/15   [name], MD  glipiZIDE (GLUCOTROL) 10 MG tablet Take 1 tablet (10 mg total) by mouth daily before  breakfast. 09/22/14   [name], MD  ibuprofen (ADVIL,MOTRIN) 400 MG tablet Take 1 tablet (400 mg total) by mouth 2 (two) times daily as needed. 01/01/15   [name], MD  metFORMIN (GLUCOPHAGE) 500 MG tablet Take 500 mg by mouth 2 (two) times daily with a meal.     Historical Provider, MD  Multiple Vitamins-Minerals (MULTIVITAMIN PO) Multivitamin packet containing 5 tablets.    Historical Provider, MD  oxybutynin (DITROPAN) 5 MG tablet Take 1 tablet (5 mg total) by mouth 3 (three) times daily. 09/15/14   [name], MD  vitamin B-12 (CYANOCOBALAMIN) 1000 MCG tablet Take 1,000 mcg by mouth daily.    Historical Provider, MD    Family History  Family History  Problem Relation  Age of Onset  . Diabetes Father   . Diabetes Mother   . Heart disease Father   . Heart disease Mother   . Cancer Brother     throat    Social History  Social History   Social History  . Marital Status: Married    Spouse Name: N/A  . Number of Children: N/A  . Years of Education: N/A   Occupational History  . Not on file.   Social History Main Topics  . Smoking status: Never Smoker   . Smokeless tobacco: Never Used  . Alcohol Use: Yes     Comment: Regular (bourbon and coke 3-4/day)  . Drug Use: No  . Sexual Activity: Not on file   Other Topics Concern  . Not on file   Social History Narrative   Caffeine: coffee 1 cup/day   Lives with wife [name]), 1 cat   Occupation: retired, worked for [employer]   Edu: BS   Activity: no regular exercise   Diet: some water, fruits/vegetables daily      Advanced directives: has living will at home. Does not want prolonged life support "no extraordinary measures" but would accept temporary measures. Would want wife to be HCPOA.      Review of Systems General:  No chills, fever, night sweats or weight changes.  Cardiovascular:  No chest pain, dyspnea on exertion, edema, orthopnea, palpitations, paroxysmal nocturnal dyspnea. Dermatological: No rash, lesions/masses Respiratory: No cough, dyspnea Urologic: No hematuria, dysuria Abdominal:   No nausea, vomiting, diarrhea, bright red blood per rectum, melena, or hematemesis Neurologic:  No visual changes, wkns, changes in mental status. All other systems reviewed and are otherwise negative except as noted above.  Physical Exam  Blood pressure 108/45, pulse 70, temperature 97.7 F (36.5 C), temperature source Oral, resp. rate 16, height 6' 2"  (1.88 m), weight 260 lb 9.3 oz (118.2 kg), SpO2 97 %.  General: Pleasant, NAD Psych: Normal affect. Neuro: Alert and oriented X 3. Moves all extremities spontaneously. HEENT: Normal  Neck: Supple without bruits or JVD. Lungs:  Resp regular and  unlabored, CTA. Heart: Irregularly irregular rhythm, regular rate, 2/6 systolic AS murmur (late peaking) Abdomen: Soft, non-tender, non-distended, BS + x 4.  Extremities: No clubbing, cyanosis or edema. DP/PT/Radials 2+ and equal bilaterally.  Labs  Troponin (Point of Care Test) No results for input(s): TROPIPOC in the last 72 hours. No results for input(s): CKTOTAL, CKMB, TROPONINI in the last 72 hours. Lab Results  Component Value Date   WBC 5.0 09/10/2014   HGB 13.4 09/10/2014   HCT 39.7 09/10/2014   MCV 97.6 09/10/2014   PLT 162.0 09/10/2014    Recent Labs Lab 02/19/15 1250 02/23/15 1113  NA 136 134*  K 4.4 5.1  CL 101 102  CO2 26 22  BUN 48* 42*  CREATININE 2.29* 2.28*  CALCIUM 10.3 9.9  PROT 7.6  --   BILITOT 0.6  --   ALKPHOS 48  --   ALT 18  --   AST 26  --   GLUCOSE 139* 173*   Lab Results  Component Value Date   CHOL 181 09/10/2014   HDL 69.20 09/10/2014   LDLCALC 96 09/10/2014   TRIG 78.0 09/10/2014   Lab Results  Component Value Date   DDIMER 1.13* 03/16/2011     Radiology/Studies  No results found.  ECG  Atrial Fibrillation w/ a CVR in the 60s.    ASSESSMENT AND PLAN  1. Atrial Fibrillation: CVR in the 60s but has been symptomatic. Fully compliant with Eliquis. Plan is to initiate Tikosyn. 12 lead EKG shows stable QT/QTc of 393 ms. His CrCl is 46.1 mL/min. He will therefore need a starting dose of 250 mcg BID. K is stable at 5.1 and Mg is also stable at 1.8. Will monitor electrolytes daily and supplement as needed, keeping K >3.9 and Mg >1.8. Check f/u EKGs every 2-3 hrs after each Tikosyn dose. If Tikosyn is not successful in cardioversion, will need to consider electrical DCCV. Continue Eliquis.    Signed, Lyda Jester, PA-C 02/23/2015, 4:03 PM   I have seen, examined the patient, and reviewed the above assessment and plan.  On exam, iRRR with late peaking AS murmur. Changes to above are made where necessary.  Persistent afib  with severe LA enlargment and moderate to severe AS.  Reduced renal function limits dosing of tikosyn.  I am not optimistic that he will maintain sinus rhythm long term.  If he fails tikosyn, would consider surgical MAZE and concomitant AVR.  He would not be a good candidate for ablation.   Co Sign: Thompson Grayer, MD 02/23/2015 8:39 PM

## 2015-02-23 NOTE — Progress Notes (Signed)
Pharmacy Review for Dofetilide (Tikosyn) Initiation  Admit Complaint: 74 y.o. male admitted 02/23/2015 with atrial fibrillation to be initiated on dofetilide.   Assessment:  Patient Exclusion Criteria: If any screening criteria checked as "Yes", then  patient  should NOT receive dofetilide until criteria item is corrected. If "Yes" please indicate correction plan.  YES  NO Patient  Exclusion Criteria Correction Plan  []  [x]  Baseline QTc interval is greater than or equal to 440 msec. IF above YES box checked dofetilide contraindicated unless patient has ICD; then may proceed if QTc 500-550 msec or with known ventricular conduction abnormalities may proceed with QTc 550-600 msec. QTc = 393 EKG reviewed by Dr. Lovena Le. Atrial fibrillation with vent rate of 67 bpm. QTc 393 msec Per Tikosyn clinic note  []  [x]  Magnesium level is less than 1.8 mEq/l : Last magnesium:  Lab Results  Component Value Date   MG 1.8 02/23/2015         []  [x]  Potassium level is less than 4 mEq/l : Last potassium:  Lab Results  Component Value Date   K 5.1 02/23/2015         []  [x]  Patient is known or suspected to have a digoxin level greater than 2 ng/ml: No results found for: DIGOXIN    []  [x]  Creatinine clearance less than 20 ml/min (calculated using Cockcroft-Gault, actual body weight and serum creatinine): Estimated Creatinine Clearance: 37.7 mL/min (by C-G formula based on Cr of 2.28).  Estimated Crcl 45 based on Actual Body wt  []  [x]  Patient has received drugs known to prolong the QT intervals within the last 48 hours (phenothiazines, tricyclics or tetracyclic antidepressants, erythromycin, H-1 antihistamines, cisapride, fluoroquinolones, azithromycin). Drugs not listed above may have an, as yet, undetected potential to prolong the QT interval, updated information on QT prolonging agents is available at this website:QT prolonging agents   []  [x]  Patient received a dose of hydrochlorothiazide (Oretic)  alone or in any combination including triamterene (Dyazide, Maxzide) in the last 48 hours.   []  [x]  Patient received a medication known to increase dofetilide plasma concentrations prior to initial dofetilide dose:  . Trimethoprim (Primsol, Proloprim) in the last 36 hours . Verapamil (Calan, Verelan) in the last 36 hours or a sustained release dose in the last 72 hours . Megestrol (Megace) in the last 5 days  . Cimetidine (Tagamet) in the last 6 hours . Ketoconazole (Nizoral) in the last 24 hours . Itraconazole (Sporanox) in the last 48 hours  . Prochlorperazine (Compazine) in the last 36 hours    []  [x]  Patient is known to have a history of torsades de pointes; congenital or acquired long QT syndromes.   []  [x]  Patient has received a Class 1 antiarrhythmic with less than 2 half-lives since last dose. (Disopyramide, Quinidine, Procainamide, Lidocaine, Mexiletine, Flecainide, Propafenone)   []  [x]  Patient has received amiodarone therapy in the past 3 months or amiodarone level is greater than 0.3 ng/ml.    Patient has been appropriately anticoagulated with apixaban.  Ordering provider was confirmed at [email or web address] if they are not listed on the [place] Prescribers list.  Goal of Therapy: Follow renal function, electrolytes, potential drug interactions, and dose adjustment. Provide education and 1 week supply at discharge.  Plan:  [x]   Physician selected initial dose within range recommended for patients level of renal function - will monitor for response.  []   Physician selected initial dose outside of range recommended for patients level of renal function - will discuss if the  dose should be altered at this time.   Select One Calculated CrCl  Dose q12h  []  > 60 ml/min 500 mcg  [x]  40-60 ml/min 250 mcg  []  20-40 ml/min 125 mcg   2. Follow up QTc after the first 5 doses, renal function, electrolytes (K & Mg) daily x 3 days, dose adjustment, success of initiation and  facilitate 1 week discharge supply as clinically indicated.  3. Initiate Tikosyn education video (Call [phone] and ask for video # [phone]).  4. Place Enrollment Form on the chart for discharge supply of dofetilide.   Erin Hearing PharmD., BCPS Clinical Pharmacist Pager [phone] 02/23/2015 5:36 PM

## 2015-02-23 NOTE — Progress Notes (Signed)
HPI Bill [name] is a 74 yo patient of Dr. Lovena Le who comes in today for Tikosyn initiation.  He was seen by Dr. Lovena Le on 01/27/15.  He has a PMH significant for atrial fibrillation.  Recent 2D echo demonstrated that the patient had mod-severe aortic stenosis and mild mitral regurgitation. He has preserved LV function. He continued to have SOB despite increasing his diuretic.  Dr. Lovena Le discussed options for restoring NSR.  Pt had previously refused anti-coagulation except for ASA.  He agreed to anticoagulation for at least 3 weeks before and after Tikosyn.  He was placed on Eliquis 16m BID and made plans to start Tikosyn within 4 weeks.    Pt is here today with his wife.  We discussed the role of Tikosyn in treating Atrial fib as well as potential side effects, including QTc prolongation.  He is aware of the importance of compliance and will call the office if he misses more tahn 2 doses in a row.  Reviewed pt's medication list.  He is currently not taking any QTc prolongating or contraindicated medications.  He has been anticoagulated with Eliquis for > 3 weeks and reports compliance.  He has never tried any other antiarrhythmic medications.    Allergies  Allergen Reactions  . Ace Inhibitors Other (See Comments)    hyperkalemia     Current Outpatient Prescriptions  Medication Sig Dispense Refill  . apixaban (ELIQUIS) 5 MG TABS tablet Take 1 tablet (5 mg total) by mouth 2 (two) times daily.    . Blood Glucose Monitoring Suppl (ONE TOUCH ULTRA MINI) W/DEVICE KIT Check blood sugar once a day and as needed. E11.319. 1 each 0  . cholecalciferol (VITAMIN D) 1000 UNITS tablet Take 1,000 Units by mouth daily.    . enalapril (VASOTEC) 10 MG tablet Take 0.5 tablets (5 mg total) by mouth daily.    . furosemide (LASIX) 20 MG tablet Take 1 tablet (20 mg total) by mouth daily.    .Marland KitchenglipiZIDE (GLUCOTROL) 10 MG tablet Take 1 tablet (10 mg total) by mouth daily before breakfast. 90 tablet 3  .  ibuprofen (ADVIL,MOTRIN) 400 MG tablet Take 1 tablet (400 mg total) by mouth 2 (two) times daily as needed. 180 tablet 1  . metFORMIN (GLUCOPHAGE) 500 MG tablet Take 500 mg by mouth 2 (two) times daily with a meal.     . Multiple Vitamins-Minerals (MULTIVITAMIN PO) Multivitamin packet containing 5 tablets.    .Marland Kitchenoxybutynin (DITROPAN) 5 MG tablet Take 1 tablet (5 mg total) by mouth 3 (three) times daily. 270 tablet 3  . vitamin B-12 (CYANOCOBALAMIN) 1000 MCG tablet Take 1,000 mcg by mouth daily.     No current facility-administered medications for this visit.     Past Medical History  Diagnosis Date  . Hypertension   . Type 2 diabetes, controlled, with retinopathy 1980s  . Exogenous obesity   . Urge incontinence   . Lyme disease 2012    ?titers negative  . History of chicken pox   . Aortic stenosis 12/2010    a. Echo 12/29/10: Mild LVH, EF 50-55%, normal wall motion, mild aortic stenosis, trivial AI, mean gradient 19 mmHg, mild LAE;  b. Echo 11/13:  mod LVH, vigorous LV EF 65-70%, mod AS, mean 23 mmHg, mild MR, mild BAE, mild RVE  c. echo 08/2013 with mod-severe AS  . Kidney stones remote  . GERD (gastroesophageal reflux disease)     occasional  . Atrial fibrillation   .  Vitamin B12 deficiency     IF normal (2014)       EKG reviewed by Dr. Lovena Le.  Atrial fibrillation with vent rate of 67 bpm.  QTc 393 msec.    Assess/Plan:  1.  Atrial Fibrillation- Reviewed pt's labs.  K- 5.1 and Mg- 1.8.  Okay to start Tikosyn.  CrCl- 45.8 mL/min.  Will need to start a the reduced dose of 250 mcg BID. Pt aware to report to hospital

## 2015-02-23 NOTE — Progress Notes (Signed)
PHARMACIST - PHYSICIAN COMMUNICATION DR: Lovena Le CONCERNING:  METFORMIN SAFE ADMINISTRATION POLICY  RECOMMENDATION: Metformin has been placed on DISCONTINUE (rejected order) STATUS and should be reordered only after any of the conditions below are ruled out.  Current safety recommendations include avoiding metformin for a minimum of 48 hours after the patient's exposure to intravenous contrast media.  DESCRIPTION:  The Pharmacy Committee has adopted a policy that restricts the use of metformin in hospitalized patients until all the following conditions have been ruled out.  Specific contraindications are:  [x]  eGFR is below 30 ml/minute []  Planned administration of intravenous iodinated contrast media []  Acute or chronic metabolic acidosis (including DKA)  []  Shock, acute MI, sepsis, hypoxemia, dehydration []  Heart Failure patients with low EF       Dimitri Ped, PharmD. Clinical Pharmacist Resident Pager: [phone]  02/23/2015 5:26 PM

## 2015-02-24 LAB — BASIC METABOLIC PANEL
ANION GAP: 10 (ref 5–15)
BUN: 37 mg/dL — ABNORMAL HIGH (ref 6–20)
CALCIUM: 9.7 mg/dL (ref 8.9–10.3)
CO2: 23 mmol/L (ref 22–32)
Chloride: 104 mmol/L (ref 101–111)
Creatinine, Ser: 2.2 mg/dL — ABNORMAL HIGH (ref 0.61–1.24)
GFR, EST AFRICAN AMERICAN: 32 mL/min — AB (ref >60)
GFR, EST NON AFRICAN AMERICAN: 27 mL/min — AB (ref >60)
Glucose, Bld: 112 mg/dL — ABNORMAL HIGH (ref 65–99)
POTASSIUM: 4.8 mmol/L (ref 3.5–5.1)
SODIUM: 137 mmol/L (ref 135–145)

## 2015-02-24 LAB — MAGNESIUM: MAGNESIUM: 2.1 mg/dL (ref 1.7–2.4)

## 2015-02-24 NOTE — Care Management Note (Addendum)
Case Management Note  Patient Details  Name: Bill [name] MRN: [id number] Date of Birth: 1940-11-04  Subjective/Objective: Pt admitted for Tikosyn Load.                     Action/Plan: Benefits check in process for medication. Will make pt aware of cost once completed. Pt uses [facility] in [place]. CM did call and medication is available. Pt will need a Rx for 7 day supply to be filled via [facility] before d/c. CM will assist with this Rx. Per pt he would like to get a 90 day supply with refills sent to mail order. Pt will need a 30 day supply to be sent to [facility] due to mail order usually takes 7-14 days for delivery.  CM will continue to monitor for additional needs.   Expected Discharge Date:                  Expected Discharge Plan:  Home/Self Care  In-House Referral:  NA  Discharge planning Services  CM Consult, Medication Assistance  Post Acute Care Choice:  NA Choice offered to:  NA  DME Arranged:  N/A DME Agency:  NA  HH Arranged:  NA HH Agency:  NA  Status of Service:  Completed, signed off  Medicare Important Message Given:    Date Medicare IM Given:    Medicare IM give by:    Date Additional Medicare IM Given:    Additional Medicare Important Message give by:     If discussed at [place] of Stay Meetings, dates discussed:    Additional Comments:    1025 02-26-15 [name], RN,BSN [phone] CM sent Tikosyn Rx to pharmacy. No further needs from CM at this time.   1020 02-24-15 [name], RN, BSN [phone] Per rep at [place] benefits:   Tikosyn 258mcg: Tier 3, no auth required  30day: $45 at retail  90day: $135 retail   Patient can use: [facility] major retail pharmacies   [name], RN 02/24/2015, 10:06 AM

## 2015-02-24 NOTE — Progress Notes (Addendum)
Clients cardiac strips show many pauses 1.6 - 2.2 before and after Tikosyn dose given more so at night when the client is resting and the heart rate drops into the low 40's non-sustained . The client is asymptomatic QTc was 390 before Tikosyn was given and 438 after. Will continue to monitor.

## 2015-02-24 NOTE — Progress Notes (Signed)
    SUBJECTIVE: The patient is doing well today.  At this time, he denies chest pain, shortness of breath, or any new concerns.  Admitted 02/23/15 for Tikosyn load  CURRENT MEDICATIONS: . apixaban  5 mg Oral BID  . cholecalciferol  1,000 Units Oral Daily  . dofetilide  250 mcg Oral BID  . enalapril  5 mg Oral Daily  . furosemide  20 mg Oral Daily  . glipiZIDE  10 mg Oral QAC breakfast  . oxybutynin  5 mg Oral TID  . sodium chloride  3 mL Intravenous Q12H      OBJECTIVE: Physical Exam: Filed Vitals:   02/23/15 1529 02/23/15 2041 02/24/15 0506  BP: 108/45 115/36 100/44  Pulse: 70 72 53  Temp: 97.7 F (36.5 C) 97.8 F (36.6 C) 98 F (36.7 C)  TempSrc: Oral Oral Oral  Resp: 16 18 20   Height: 6\' 2"  (1.88 m)    Weight: 260 lb 9.3 oz (118.2 kg)  257 lb 3.2 oz (116.665 kg)  SpO2: 97% 94% 97%   No intake or output data in the 24 hours ending 02/24/15 0952  Telemetry reveals rate controlled AF  GEN- The patient is well appearing, alert and oriented x 3 today.   Head- normocephalic, atraumatic Eyes-  Sclera clear, conjunctiva pink Ears- hearing intact Oropharynx- clear Neck- supple  Lungs- Clear to ausculation bilaterally, normal work of breathing Heart- Irregular rate and rhythm  GI- soft, NT, ND, + BS Extremities- no clubbing, cyanosis, or edema Skin- no rash or lesion Psych- euthymic mood, full affect Neuro- strength and sensation are intact  LABS: Basic Metabolic Panel:  Recent Labs  02/23/15 1113 02/24/15 0611  NA 134* 137  K 5.1 4.8  CL 102 104  CO2 22 23  GLUCOSE 173* 112*  BUN 42* 37*  CREATININE 2.28* 2.20*  CALCIUM 9.9 9.7  MG 1.8 2.1    ASSESSMENT AND PLAN:  Active Problems:   Atrial fibrillation (HCC)   1.  Longstanding persistent atrial fibrillation Admitted 02/23/15 for Tikosyn load Continue Eliquis for CHADS2VASC of at least 4 QTc, K and Mg stable today Will need DCCV tomorrow morning if still in AF (NPO after midnight tonight)  2.   Aortic stenosis Moderate to severe by last echo May need to consider MAZE with AVR if Tikosyn fails to maintain SR   Anticipate discharge on Friday 10/7.  Will need 1 and 4 week follow up with AF clinic and 3 month follow up with Dr Emi Holes, NP 02/24/2015 9:55 AM   EP Attending  Patient seen and examined. He remains in atrial fib after a single dose of Tikosyn, another dose is pending. He will undergo DCCV on Thursday if he has not converted spontaneously on tikosyn. QT is ok.   Mikle Bosworth.D.

## 2015-02-24 NOTE — Progress Notes (Signed)
UR Completed Dorsie Sethi Graves-Bigelow, RN,BSN [phone]  

## 2015-02-25 ENCOUNTER — Encounter (HOSPITAL_COMMUNITY)
Admission: AD | Disposition: A | Discharge status: Home / Self Care | Payer: Self-pay | Source: Ambulatory Visit | Attending: Internal Medicine

## 2015-02-25 ENCOUNTER — Inpatient Hospital Stay (HOSPITAL_COMMUNITY): Payer: PPO | Admitting: Certified Registered Nurse Anesthetist

## 2015-02-25 ENCOUNTER — Encounter (HOSPITAL_COMMUNITY): Payer: Self-pay | Admitting: *Deleted

## 2015-02-25 DIAGNOSIS — I4891 Unspecified atrial fibrillation: Secondary | ICD-10-CM

## 2015-02-25 HISTORY — PX: CARDIOVERSION: SHX1299

## 2015-02-25 LAB — BASIC METABOLIC PANEL
ANION GAP: 9 (ref 5–15)
BUN: 35 mg/dL — ABNORMAL HIGH (ref 6–20)
CALCIUM: 9.6 mg/dL (ref 8.9–10.3)
CO2: 24 mmol/L (ref 22–32)
CREATININE: 2.12 mg/dL — AB (ref 0.61–1.24)
Chloride: 99 mmol/L — ABNORMAL LOW (ref 101–111)
GFR calc Af Amer: 33 mL/min — ABNORMAL LOW (ref >60)
GFR, EST NON AFRICAN AMERICAN: 29 mL/min — AB (ref >60)
GLUCOSE: 106 mg/dL — AB (ref 65–99)
Potassium: 4.7 mmol/L (ref 3.5–5.1)
Sodium: 132 mmol/L — ABNORMAL LOW (ref 135–145)

## 2015-02-25 LAB — GLUCOSE, CAPILLARY: Glucose-Capillary: 110 mg/dL — ABNORMAL HIGH (ref 65–99)

## 2015-02-25 LAB — MAGNESIUM: MAGNESIUM: 1.9 mg/dL (ref 1.7–2.4)

## 2015-02-25 SURGERY — CARDIOVERSION
Anesthesia: Monitor Anesthesia Care

## 2015-02-25 MED ORDER — LIDOCAINE HCL (CARDIAC) 20 MG/ML IV SOLN
INTRAVENOUS | Status: DC | PRN
  Administered 2015-02-25: 100 mg via INTRAVENOUS

## 2015-02-25 MED ORDER — PROPOFOL 10 MG/ML IV BOLUS
INTRAVENOUS | Status: DC | PRN
  Administered 2015-02-25: 40 mg via INTRAVENOUS

## 2015-02-25 MED ORDER — DOFETILIDE 250 MCG PO CAPS: 250.0000 ug | ORAL_CAPSULE | Freq: Two times a day (BID) | ORAL | Status: DC

## 2015-02-25 NOTE — H&P (View-Only) (Signed)
Patient ID: JAIMIE REDDITT, male   DOB: [birth year], 74 y.o.   MRN: [id number]    Patient Name: Bill [name] Date of Encounter: 02/25/2015     Active Problems:   Atrial fibrillation ([place])    SUBJECTIVE  No chest pain or sob. No palpitations.  CURRENT MEDS . apixaban  5 mg Oral BID  . cholecalciferol  1,000 Units Oral Daily  . dofetilide  250 mcg Oral BID  . enalapril  5 mg Oral Daily  . furosemide  20 mg Oral Daily  . glipiZIDE  10 mg Oral QAC breakfast  . oxybutynin  5 mg Oral TID  . sodium chloride  3 mL Intravenous Q12H    OBJECTIVE  Filed Vitals:   02/24/15 1052 02/24/15 1340 02/24/15 2029 02/25/15 0538  BP: 125/48 123/59 130/69 135/62  Pulse:  62 53 55  Temp:  98.6 F (37 C) 98.9 F (37.2 C) 98.1 F (36.7 C)  TempSrc:  Oral Oral Oral  Resp:  18 18 18   Height:      Weight:    256 lb (116.121 kg)  SpO2:  98% 97% 97%    Intake/Output Summary (Last 24 hours) at 02/25/15 0958 Last data filed at 02/24/15 2223  Gross per 24 hour  Intake    243 ml  Output      0 ml  Net    243 ml   Filed Weights   02/23/15 1529 02/24/15 0506 02/25/15 0538  Weight: 260 lb 9.3 oz (118.2 kg) 257 lb 3.2 oz (116.665 kg) 256 lb (116.121 kg)    PHYSICAL EXAM  General: Pleasant, NAD. Neuro: Alert and oriented X 3. Moves all extremities spontaneously. Psych: Normal affect. HEENT:  Normal  Neck: Supple without bruits or JVD. Lungs:  Resp regular and unlabored, CTA. Heart: RRR no s3, s4, or murmurs. Abdomen: Soft, non-tender, non-distended, BS + x 4.  Extremities: No clubbing, cyanosis or edema. DP/PT/Radials 2+ and equal bilaterally.  Accessory Clinical Findings  CBC No results for input(s): WBC, NEUTROABS, HGB, HCT, MCV, PLT in the last 72 hours. Basic Metabolic Panel  Recent Labs  02/24/15 0611 02/25/15 0412  NA 137 132*  K 4.8 4.7  CL 104 99*  CO2 23 24  GLUCOSE 112* 106*  BUN 37* 35*  CREATININE 2.20* 2.12*  CALCIUM 9.7 9.6  MG 2.1 1.9   Liver Function  Tests No results for input(s): AST, ALT, ALKPHOS, BILITOT, PROT, ALBUMIN in the last 72 hours. No results for input(s): LIPASE, AMYLASE in the last 72 hours. Cardiac Enzymes No results for input(s): CKTOTAL, CKMB, CKMBINDEX, TROPONINI in the last 72 hours. BNP Invalid input(s): POCBNP D-Dimer No results for input(s): DDIMER in the last 72 hours. Hemoglobin A1C No results for input(s): HGBA1C in the last 72 hours. Fasting Lipid Panel No results for input(s): CHOL, HDL, LDLCALC, TRIG, CHOLHDL, LDLDIRECT in the last 72 hours. Thyroid Function Tests No results for input(s): TSH, T4TOTAL, T3FREE, THYROIDAB in the last 72 hours.  Invalid input(s): FREET3  TELE  Atrial fib with a CVR  ECG - atrial fib, QT not prolonged  Radiology/Studies  No results found.  ASSESSMENT AND PLAN  1. Atrial fib with a controlled VR - s/p initiation of Tikosyn. He will undergo DCCV later today unless he spontaneously reverts back to NSR.    Gregg Taylor,M.D.  02/25/2015 9:58 AM

## 2015-02-25 NOTE — Clinical Documentation Improvement (Signed)
Cardiology Please clarify if the patient's diagnosis of renal insufficiency can be further specified and document in progress note or discharge summary.    Acute kidney injury / acute renal failure  Acute kidney injury on chronic kidney disease (if present, please also specify stage of ckd if known)  Chronic kidney disease (Please specify stage if known)  Renal insufficiency only as currently documented  Other Condition  Cannot Clinically Determine   Supporting Information: Current documentation reflects "renal insufficiency / renal disease", "reduced renal function".    Abnormal Lab/Test Results:   Component BUN Creatinine  Latest Ref Rng 6 - 20 mg/dL 0.61 - 1.24 mg/dL  02/23/2015 42 (H) 2.28 (H)  02/24/2015 37 (H) 2.20 (H)  02/25/2015 35 (H) 2.12 (H)   Component EGFR (Non-African Amer.)  Latest Ref Rng >60 mL/min  02/24/2015 27 (L)  02/25/2015 29 (L)     Please exercise your independent, professional judgment when responding. A specific answer is not anticipated or expected.  Thank you, Mateo Flow, RN [phone] Clinical Documentation Specialist

## 2015-02-25 NOTE — Interval H&P Note (Signed)
History and Physical Interval Note:  02/25/2015 11:19 AM  Bill [name]  has presented today for surgery, with the diagnosis of A FIB   The various methods of treatment have been discussed with the patient and family. After consideration of risks, benefits and other options for treatment, the patient has consented to  Procedure(s): CARDIOVERSION (N/A) as a surgical intervention .  The patient's history has been reviewed, patient examined, no change in status, stable for surgery.  I have reviewed the patient's chart and labs.  Questions were answered to the patient's satisfaction.    Maecie Sevcik C. Oval Linsey, MD 02/25/2015 11:19 AM

## 2015-02-25 NOTE — CV Procedure (Signed)
Electrical Cardioversion Procedure Note Bill [name] [id number] 1941-04-16  Procedure: Electrical Cardioversion Indications:  Atrial Fibrillation  Procedure Details Consent: Risks of procedure as well as the alternatives and risks of each were explained to the (patient/caregiver).  Consent for procedure obtained. Time Out: Verified patient identification, verified procedure, site/side was marked, verified correct patient position, special equipment/implants available, medications/allergies/relevent history reviewed, required imaging and test results available.  Performed  Patient placed on cardiac monitor, pulse oximetry, supplemental oxygen as necessary.  Sedation given: Propofol via Anesthesia Pacer pads placed anterior and posterior chest.  Cardioverted 2 time(s).  Cardioverted at 150J unsuccessful.  200J successful.  Evaluation Findings: Post procedure EKG shows: sinus rhythm with first degree AV block Complications: None Patient did tolerate procedure well.   Sharol Harness, MD 02/25/2015, 11:42 AM

## 2015-02-25 NOTE — Progress Notes (Signed)
Patient ID: NAI DASCH, male   DOB: 03/19/1941, 74 y.o.   MRN: [id number]    Patient Name: Bill [name] Date of Encounter: 02/25/2015     Active Problems:   Atrial fibrillation ([place])    SUBJECTIVE  No chest pain or sob. No palpitations.  CURRENT MEDS . apixaban  5 mg Oral BID  . cholecalciferol  1,000 Units Oral Daily  . dofetilide  250 mcg Oral BID  . enalapril  5 mg Oral Daily  . furosemide  20 mg Oral Daily  . glipiZIDE  10 mg Oral QAC breakfast  . oxybutynin  5 mg Oral TID  . sodium chloride  3 mL Intravenous Q12H    OBJECTIVE  Filed Vitals:   02/24/15 1052 02/24/15 1340 02/24/15 2029 02/25/15 0538  BP: 125/48 123/59 130/69 135/62  Pulse:  62 53 55  Temp:  98.6 F (37 C) 98.9 F (37.2 C) 98.1 F (36.7 C)  TempSrc:  Oral Oral Oral  Resp:  18 18 18   Height:      Weight:    256 lb (116.121 kg)  SpO2:  98% 97% 97%    Intake/Output Summary (Last 24 hours) at 02/25/15 0958 Last data filed at 02/24/15 2223  Gross per 24 hour  Intake    243 ml  Output      0 ml  Net    243 ml   Filed Weights   02/23/15 1529 02/24/15 0506 02/25/15 0538  Weight: 260 lb 9.3 oz (118.2 kg) 257 lb 3.2 oz (116.665 kg) 256 lb (116.121 kg)    PHYSICAL EXAM  General: Pleasant, NAD. Neuro: Alert and oriented X 3. Moves all extremities spontaneously. Psych: Normal affect. HEENT:  Normal  Neck: Supple without bruits or JVD. Lungs:  Resp regular and unlabored, CTA. Heart: RRR no s3, s4, or murmurs. Abdomen: Soft, non-tender, non-distended, BS + x 4.  Extremities: No clubbing, cyanosis or edema. DP/PT/Radials 2+ and equal bilaterally.  Accessory Clinical Findings  CBC No results for input(s): WBC, NEUTROABS, HGB, HCT, MCV, PLT in the last 72 hours. Basic Metabolic Panel  Recent Labs  02/24/15 0611 02/25/15 0412  NA 137 132*  K 4.8 4.7  CL 104 99*  CO2 23 24  GLUCOSE 112* 106*  BUN 37* 35*  CREATININE 2.20* 2.12*  CALCIUM 9.7 9.6  MG 2.1 1.9   Liver Function  Tests No results for input(s): AST, ALT, ALKPHOS, BILITOT, PROT, ALBUMIN in the last 72 hours. No results for input(s): LIPASE, AMYLASE in the last 72 hours. Cardiac Enzymes No results for input(s): CKTOTAL, CKMB, CKMBINDEX, TROPONINI in the last 72 hours. BNP Invalid input(s): POCBNP D-Dimer No results for input(s): DDIMER in the last 72 hours. Hemoglobin A1C No results for input(s): HGBA1C in the last 72 hours. Fasting Lipid Panel No results for input(s): CHOL, HDL, LDLCALC, TRIG, CHOLHDL, LDLDIRECT in the last 72 hours. Thyroid Function Tests No results for input(s): TSH, T4TOTAL, T3FREE, THYROIDAB in the last 72 hours.  Invalid input(s): FREET3  TELE  Atrial fib with a CVR  ECG - atrial fib, QT not prolonged  Radiology/Studies  No results found.  ASSESSMENT AND PLAN  1. Atrial fib with a controlled VR - s/p initiation of Tikosyn. He will undergo DCCV later today unless he spontaneously reverts back to NSR.    Gregg Taylor,M.D.  02/25/2015 9:58 AM

## 2015-02-25 NOTE — Transfer of Care (Signed)
Immediate Anesthesia Transfer of Care Note  Patient: Bill [name]  Procedure(s) Performed: Procedure(s): CARDIOVERSION (N/A)  Patient Location: PACU and Endoscopy Unit  Anesthesia Type:MAC  Level of Consciousness: awake, patient cooperative and responds to stimulation  Airway & Oxygen Therapy: Patient Spontanous Breathing  Post-op Assessment: Report given to RN, Post -op Vital signs reviewed and stable and Patient moving all extremities X 4  Post vital signs: Reviewed and stable  Last Vitals:  Filed Vitals:   02/25/15 1035  BP: 120/66  Pulse:   Temp: 36.7 C  Resp: 16    Complications: No apparent anesthesia complications

## 2015-02-25 NOTE — Anesthesia Preprocedure Evaluation (Signed)
Anesthesia Evaluation  Patient identified by MRN, date of birth, ID band Patient awake    Reviewed: Allergy & Precautions, NPO status , Patient's Chart, lab work & pertinent test results  History of Anesthesia Complications Negative for: history of anesthetic complications  Airway Mallampati: III  TM Distance: >3 FB Neck ROM: Full    Dental  (+) Teeth Intact, Dental Advisory Given   Pulmonary neg pulmonary ROS,    Pulmonary exam normal breath sounds clear to auscultation       Cardiovascular hypertension, Pt. on medications (-) anginaNormal cardiovascular exam+ Valvular Problems/Murmurs (moderate AS) AS  Rhythm:Irregular Rate:Bradycardia     Neuro/Psych  Neuromuscular disease negative psych ROS   GI/Hepatic Neg liver ROS, GERD  Medicated,  Endo/Other  diabetes, Type 2, Oral Hypoglycemic AgentsObesity   Renal/GU Renal InsufficiencyRenal disease     Musculoskeletal  (+) Arthritis , Osteoarthritis,    Abdominal   Peds  Hematology negative hematology ROS (+)   Anesthesia Other Findings Day of surgery medications reviewed with the patient.  Reproductive/Obstetrics                             Anesthesia Physical Anesthesia Plan  ASA: III  Anesthesia Plan: MAC   Post-op Pain Management:    Induction: Intravenous  Airway Management Planned: Nasal Cannula  Additional Equipment:   Intra-op Plan:   Post-operative Plan:   Informed Consent: I have reviewed the patients History and Physical, chart, labs and discussed the procedure including the risks, benefits and alternatives for the proposed anesthesia with the patient or authorized representative who has indicated his/her understanding and acceptance.   Dental advisory given  Plan Discussed with: CRNA and Anesthesiologist  Anesthesia Plan Comments: (Discussed risks/benefits/alternatives to MAC sedation including need for ventilatory  support, hypotension, need for conversion to general anesthesia.  All patient questions answered.  Patient wished to proceed.)        Anesthesia Quick Evaluation

## 2015-02-26 ENCOUNTER — Encounter (HOSPITAL_COMMUNITY): Payer: Self-pay | Admitting: Cardiovascular Disease

## 2015-02-26 LAB — BASIC METABOLIC PANEL
Anion gap: 10 (ref 5–15)
BUN: 35 mg/dL — ABNORMAL HIGH (ref 6–20)
CHLORIDE: 101 mmol/L (ref 101–111)
CO2: 23 mmol/L (ref 22–32)
CREATININE: 1.95 mg/dL — AB (ref 0.61–1.24)
Calcium: 9.6 mg/dL (ref 8.9–10.3)
GFR calc non Af Amer: 32 mL/min — ABNORMAL LOW (ref >60)
GFR, EST AFRICAN AMERICAN: 37 mL/min — AB (ref >60)
Glucose, Bld: 101 mg/dL — ABNORMAL HIGH (ref 65–99)
Potassium: 4.5 mmol/L (ref 3.5–5.1)
Sodium: 134 mmol/L — ABNORMAL LOW (ref 135–145)

## 2015-02-26 LAB — MAGNESIUM: Magnesium: 1.9 mg/dL (ref 1.7–2.4)

## 2015-02-26 MED ORDER — APIXABAN 5 MG PO TABS: 5.0000 mg | ORAL_TABLET | Freq: Two times a day (BID) | ORAL | Status: DC

## 2015-02-26 NOTE — Discharge Summary (Signed)
ELECTROPHYSIOLOGY PROCEDURE DISCHARGE SUMMARY    Patient ID: Bill [name],  MRN: [id number], DOB/AGE: 76-Mar-1942 76 y.o.  Admit date: 02/23/2015 Discharge date: 02/26/2015  Primary Care Physician: [name], MD Electrophysiologist: Lovena Le  Primary Discharge Diagnosis:  1.  Longstanding persistent atrial fibrillation status post Tikosyn loading this admission  Secondary Discharge Diagnosis:  1.  1st degree AV block 2.  Mobitz I 3.  Hypertension 4.  Obesity 5.  Diabetes 6.  GERD  No Known Allergies   Procedures This Admission:  1.  Tikosyn loading 2.  Direct current cardioversion on 02-25-15 by Dr Oval Linsey which successfully restored SR.  There were no early apparent complications.   Brief HPI: Bill [name] is a 74 y.o. male with a past medical history as noted above.  He has become more symptomatic with atrial fibrillation and Tikosyn initiation was discussed with the patient.  Risks, benefits, and alternatives to Tikosyn were reviewed with the patient who wished to proceed.    Hospital Course:  The patient was admitted and Tikosyn was initiated.  Renal function and electrolytes were followed during the hospitalization.  Their QTc remained stable.  On 02-25-15 they underwent direct current cardioversion which restored sinus rhythm.  They were monitored until discharge on telemetry which demonstrated sinus rhythm with 1st degree AV block, Mobitz I, short run of AF.  On the day of discharge, they were examined by Dr Lovena Le who considered them stable for discharge to home.  Follow-up has been arranged with Roderic Palau, NP in 1 week and with Dr Lovena Le in 3 months.    Physical Exam: Filed Vitals:   02/25/15 1150 02/25/15 1600 02/25/15 2100 02/26/15 0510  BP: 136/69 137/69 133/72 126/57  Pulse: 69 68 67 65  Temp:  97.8 F (36.6 C) 98.6 F (37 C) 98.6 F (37 C)  TempSrc:  Oral Oral Oral  Resp: 12 14 16 18   Height:      Weight:    256 lb 8 oz (116.348 kg)    SpO2: 98% 92% 99% 96%    Labs:   Lab Results  Component Value Date   WBC 5.0 09/10/2014   HGB 13.4 09/10/2014   HCT 39.7 09/10/2014   MCV 97.6 09/10/2014   PLT 162.0 09/10/2014    Recent Labs Lab 02/19/15 1250  02/26/15 0324  NA 136  < > 134*  K 4.4  < > 4.5  CL 101  < > 101  CO2 26  < > 23  BUN 48*  < > 35*  CREATININE 2.29*  < > 1.95*  CALCIUM 10.3  < > 9.6  PROT 7.6  --   --   BILITOT 0.6  --   --   ALKPHOS 48  --   --   ALT 18  --   --   AST 26  --   --   GLUCOSE 139*  < > 101*  < > = values in this interval not displayed.   Discharge Medications:    Medication List    TAKE these medications        apixaban 5 MG Tabs tablet  Commonly known as:  ELIQUIS  Take 1 tablet (5 mg total) by mouth 2 (two) times daily.     aspirin EC 81 MG tablet  Take 81 mg by mouth daily.     cholecalciferol 1000 UNITS tablet  Commonly known as:  VITAMIN D  Take 1,000 Units by mouth daily.  dofetilide 250 MCG capsule  Commonly known as:  TIKOSYN  Take 1 capsule (250 mcg total) by mouth 2 (two) times daily.     enalapril 10 MG tablet  Commonly known as:  VASOTEC  Take 0.5 tablets (5 mg total) by mouth daily.     furosemide 20 MG tablet  Commonly known as:  LASIX  Take 1 tablet (20 mg total) by mouth daily.     glipiZIDE 10 MG tablet  Commonly known as:  GLUCOTROL  Take 1 tablet (10 mg total) by mouth daily before breakfast.     ibuprofen 400 MG tablet  Commonly known as:  ADVIL,MOTRIN  Take 1 tablet (400 mg total) by mouth 2 (two) times daily as needed.     metFORMIN 500 MG tablet  Commonly known as:  GLUCOPHAGE  Take 500 mg by mouth 2 (two) times daily with a meal.     MULTIVITAMIN PO  Multivitamin packet containing 5 tablets.     oxybutynin 5 MG tablet  Commonly known as:  DITROPAN  Take 1 tablet (5 mg total) by mouth 3 (three) times daily.     vitamin B-12 1000 MCG tablet  Commonly known as:  CYANOCOBALAMIN  Take 1,000 mcg by mouth daily.         Disposition:   Follow-up Information    Follow up with [facility] On 03/05/2015.   Why:  at 10:30AM   Contact information:   [place] [phone]      Follow up with [facility] On 03/26/2015.   Why:  at 9:30AM   Contact information:   [place] [phone]      Follow up with Cristopher Peru, MD On 06/01/2015.   Specialty:  Cardiology   Why:  at 9:30AM   Contact information:   [place] [place] [address] [phone]       Duration of Discharge Encounter: Greater than 30 minutes including physician time.  Signed, Chanetta Marshall, NP 02/26/2015 9:38 AM   EP Attending  Patient seen and examined. Agree with above. He does have some asymptomatic AV WB. Will follow. [place] for discharge. He will need followup in a week for labs and an ECG.  Mikle Bosworth.D.

## 2015-02-26 NOTE — Progress Notes (Signed)
Notified Doctor Claiborne Billings that the client was found to be back in Atrial Fibrillation after the successful electrical cardioversion today. Will give the next dose of Tikosyn and continue to monitor the client.

## 2015-02-26 NOTE — Anesthesia Postprocedure Evaluation (Signed)
  Anesthesia Post-op Note  Patient: Bill [name]  Procedure(s) Performed: Procedure(s) (LRB): CARDIOVERSION (N/A)  Patient Location: PACU  Anesthesia Type: MAC  Level of Consciousness: awake and alert   Airway and Oxygen Therapy: Patient Spontanous Breathing  Post-op Pain: mild  Post-op Assessment: Post-op Vital signs reviewed, Patient's Cardiovascular Status Stable, Respiratory Function Stable, Patent Airway and No signs of Nausea or vomiting  Last Vitals:  Filed Vitals:   02/26/15 0510  BP: 126/57  Pulse: 65  Temp: 37 C  Resp: 18    Post-op Vital Signs: stable   Complications: No apparent anesthesia complications

## 2015-02-26 NOTE — Care Management Important Message (Signed)
Important Message  Patient Details  Name: Bill [name] MRN: [id number] Date of Birth: [birth year]   Medicare Important Message Given:  Yes-second notification given    [name] May 02/26/2015, 12:09 PM

## 2015-02-26 NOTE — Discharge Instructions (Signed)

## 2015-03-02 ENCOUNTER — Encounter: Payer: Self-pay | Admitting: Family Medicine

## 2015-03-02 NOTE — Telephone Encounter (Signed)
Added appt note. This should not be a problem to have done.

## 2015-03-02 NOTE — Telephone Encounter (Signed)
Can we coordinate with heartcare to see if my labs can be drawn along with cardiologist's labs?

## 2015-03-05 ENCOUNTER — Telehealth (HOSPITAL_COMMUNITY): Payer: Self-pay | Admitting: *Deleted

## 2015-03-05 ENCOUNTER — Ambulatory Visit (HOSPITAL_COMMUNITY)
Admit: 2015-03-05 | Discharge: 2015-03-05 | Disposition: A | Discharge status: Home / Self Care | Payer: PPO | Source: Ambulatory Visit | Attending: Nurse Practitioner | Admitting: Nurse Practitioner

## 2015-03-05 ENCOUNTER — Other Ambulatory Visit: Payer: PPO

## 2015-03-05 ENCOUNTER — Other Ambulatory Visit (HOSPITAL_COMMUNITY): Payer: Self-pay | Admitting: *Deleted

## 2015-03-05 VITALS — BP 124/62 | HR 77 | Ht 74.0 in | Wt 262.4 lb

## 2015-03-05 DIAGNOSIS — I482 Chronic atrial fibrillation, unspecified: Secondary | ICD-10-CM

## 2015-03-05 DIAGNOSIS — I10 Essential (primary) hypertension: Secondary | ICD-10-CM | POA: Diagnosis not present

## 2015-03-05 DIAGNOSIS — I481 Persistent atrial fibrillation: Secondary | ICD-10-CM | POA: Insufficient documentation

## 2015-03-05 DIAGNOSIS — I4819 Other persistent atrial fibrillation: Secondary | ICD-10-CM

## 2015-03-05 LAB — MAGNESIUM: MAGNESIUM: 1.7 mg/dL (ref 1.7–2.4)

## 2015-03-05 LAB — BASIC METABOLIC PANEL
Anion gap: 9 (ref 5–15)
BUN: 37 mg/dL — AB (ref 6–20)
CALCIUM: 9.9 mg/dL (ref 8.9–10.3)
CO2: 23 mmol/L (ref 22–32)
CREATININE: 2.19 mg/dL — AB (ref 0.61–1.24)
Chloride: 99 mmol/L — ABNORMAL LOW (ref 101–111)
GFR, EST AFRICAN AMERICAN: 32 mL/min — AB (ref >60)
GFR, EST NON AFRICAN AMERICAN: 28 mL/min — AB (ref >60)
Glucose, Bld: 149 mg/dL — ABNORMAL HIGH (ref 65–99)
Potassium: 5.2 mmol/L — ABNORMAL HIGH (ref 3.5–5.1)
SODIUM: 131 mmol/L — AB (ref 135–145)

## 2015-03-05 NOTE — Telephone Encounter (Addendum)
Potassium/ Creatinine elevated compared to recent hospital labs for tikosyn administration. Discussed with Dr. Lovena Le who indicated he would be inclined to stop ACE inhibitor with review of PCP's notes indicating hyperkalemia associated with ace inhibitor in the past. Also, messaged results to Dr. Danise Mina for his attention as well.  Patient notified of above and need to stop enalapril, decrease ibuprofen usage, increase intake of magnesium rich foods and recheck labwork/ekg in 1 week. Patient verbalized understanding.  appt made for 10/20.

## 2015-03-05 NOTE — Progress Notes (Signed)
Patient ID: Bill [name], male   DOB: [birth year], 74 y.o.   MRN: [id number]     Primary Care Physician: [name], MD Referring Physician: Dr. Clarita Leber [name] is a 74 y.o. male with a h/o  HTN, DM, CKD, Aortic stenosis, morbid obesity that underwent loading of Tikosyn on 10/4-10/7. QTc remained stable and he was discharged on 250 mg tikosyn bid. He did require cardioversion before discharge. He states that he does feel better in SR with less dyspnea. He questioned if in afib this am, he does not feel as well. EKG shows afib, rate controlled at 77 bpm, QTc stable at 436 ms. He is taking his tikosyn on a regular basis. Continues on apixaban.   Today, he denies symptoms of palpitations, shortness of breath, orthopnea, PND, lower extremity edema, dizziness, presyncope, syncope, or neurologic sequela. Mildly dyspneic this am. The patient is tolerating medications without difficulties and is otherwise without complaint today.   Past Medical History  Diagnosis Date  . Hypertension   . Type 2 diabetes, controlled, with retinopathy ([place]) 1980s  . Exogenous obesity   . Urge incontinence   . Lyme disease 2012    ?titers negative  . History of chicken pox   . Aortic stenosis 12/2010    a. Echo 12/29/10: Mild LVH, EF 50-55%, normal wall motion, mild aortic stenosis, trivial AI, mean gradient 19 mmHg, mild LAE;  b. Echo 11/13:  mod LVH, vigorous LV EF 65-70%, mod AS, mean 23 mmHg, mild MR, mild BAE, mild RVE  c. echo 08/2013 with mod-severe AS  . Kidney stones remote  . GERD (gastroesophageal reflux disease)     occasional  . Atrial fibrillation ([place])   . Vitamin B12 deficiency     IF normal (2014)   Past Surgical History  Procedure Laterality Date  . Kidney stone removal    . Finger sx    . US echocardiography  12/2010    EF 50-55%, mild LVH, normal wall motion, high LV filling pressures, mild AS, LA mildly dilated  . Back surgery  20 yrs ago    removal of lower back cartilage  due to damage. Patient does not remember date of surgery.  . Total hip arthroplasty  03/12/2012    RIGHT TOTAL HIP ARTHROPLASTY ANTERIOR APPROACH;  Surgeon: Mcarthur Rossetti, MD;  . Cataract extraction Bilateral 2015    [facility]  . Cardioversion N/A 02/25/2015    Procedure: CARDIOVERSION;  Surgeon: Skeet Latch, MD;  Location: [facility];  Service: Cardiovascular;  Laterality: N/A;    Current Outpatient Prescriptions  Medication Sig Dispense Refill  . apixaban (ELIQUIS) 5 MG TABS tablet Take 1 tablet (5 mg total) by mouth 2 (two) times daily. 60 tablet 3  . aspirin EC 81 MG tablet Take 81 mg by mouth daily.    . cholecalciferol (VITAMIN D) 1000 UNITS tablet Take 1,000 Units by mouth daily.    Marland Kitchen dofetilide (TIKOSYN) 250 MCG capsule Take 1 capsule (250 mcg total) by mouth 2 (two) times daily. 60 capsule 11  . enalapril (VASOTEC) 10 MG tablet Take 0.5 tablets (5 mg total) by mouth daily.    . furosemide (LASIX) 20 MG tablet Take 1 tablet (20 mg total) by mouth daily.    Marland Kitchen glipiZIDE (GLUCOTROL) 10 MG tablet Take 1 tablet (10 mg total) by mouth daily before breakfast. 90 tablet 3  . ibuprofen (ADVIL,MOTRIN) 400 MG tablet Take 1 tablet (400 mg total) by mouth 2 (  two) times daily as needed. 180 tablet 1  . metFORMIN (GLUCOPHAGE) 500 MG tablet Take 500 mg by mouth 2 (two) times daily with a meal.     . Multiple Vitamins-Minerals (MULTIVITAMIN PO) Multivitamin packet containing 5 tablets.    Marland Kitchen oxybutynin (DITROPAN) 5 MG tablet Take 1 tablet (5 mg total) by mouth 3 (three) times daily. 270 tablet 3  . vitamin B-12 (CYANOCOBALAMIN) 1000 MCG tablet Take 1,000 mcg by mouth daily.     No current facility-administered medications for this encounter.    No Known Allergies  Social History   Social History  . Marital Status: Married    Spouse Name: N/A  . Number of Children: N/A  . Years of Education: N/A   Occupational History  . Not on file.   Social History Main  Topics  . Smoking status: Never Smoker   . Smokeless tobacco: Never Used  . Alcohol Use: Yes     Comment: Regular (bourbon and coke 3-4/day)  . Drug Use: No  . Sexual Activity: No   Other Topics Concern  . Not on file   Social History Narrative   Caffeine: coffee 1 cup/day   Lives with wife [name]), 1 cat   Occupation: retired, worked for [employer]   Edu: BS   Activity: no regular exercise   Diet: some water, fruits/vegetables daily      Advanced directives: has living will at home. Does not want prolonged life support "no extraordinary measures" but would accept temporary measures. Would want wife to be HCPOA.     Family History  Problem Relation Age of Onset  . Diabetes Father   . Diabetes Mother   . Heart disease Father   . Heart disease Mother   . Cancer Brother     throat    ROS- All systems are reviewed and negative except as per the HPI above  Physical Exam: Filed Vitals:   03/05/15 1034  BP: 124/62  Pulse: 77  Height: 6\' 2"  (1.88 m)  Weight: 262 lb 6.4 oz (119.024 kg)    GEN- The patient is well appearing, alert and oriented x 3 today.   Head- normocephalic, atraumatic Eyes-  Sclera clear, conjunctiva pink Ears- hearing intact Oropharynx- clear Neck- supple, no JVP Lymph- no cervical lymphadenopathy Lungs- Clear to ausculation bilaterally, normal work of breathing Heart- Mildly irregular rate and rhythm, no murmurs, rubs or gallops, PMI not laterally displaced GI- soft, NT, ND, + BS Extremities- no clubbing, cyanosis, or edema MS- no significant deformity or atrophy Skin- no rash or lesion Psych- euthymic mood, full affect Neuro- strength and sensation are intact  EKG- Afib rate conrtolled at 77 bpm, qrs int 82, qt int 436 ms Epic records reviewed, previous labs and hospitalization  Assessment and Plan: 1. Persistent afib Currently in afib today, rate controlled  Bmet/mag pending. Continue tiksoyn 250 mg bid, may be going in and out of afib. This  is first  day that the pt did not feel as well. Continue apixaban  2. HTN Stable  Will see in f/u in two weeks  [name] C. Bill [name], [facility] [address] [place], [place] [address] [phone]

## 2015-03-11 ENCOUNTER — Other Ambulatory Visit (HOSPITAL_COMMUNITY): Payer: Self-pay | Admitting: *Deleted

## 2015-03-11 ENCOUNTER — Ambulatory Visit (HOSPITAL_COMMUNITY)
Admission: RE | Admit: 2015-03-11 | Discharge: 2015-03-11 | Disposition: A | Discharge status: Home / Self Care | Payer: PPO | Source: Ambulatory Visit | Attending: Nurse Practitioner | Admitting: Nurse Practitioner

## 2015-03-11 DIAGNOSIS — I129 Hypertensive chronic kidney disease with stage 1 through stage 4 chronic kidney disease, or unspecified chronic kidney disease: Secondary | ICD-10-CM | POA: Insufficient documentation

## 2015-03-11 DIAGNOSIS — N189 Chronic kidney disease, unspecified: Secondary | ICD-10-CM | POA: Insufficient documentation

## 2015-03-11 DIAGNOSIS — I481 Persistent atrial fibrillation: Secondary | ICD-10-CM | POA: Diagnosis not present

## 2015-03-11 DIAGNOSIS — I4819 Other persistent atrial fibrillation: Secondary | ICD-10-CM

## 2015-03-11 LAB — BASIC METABOLIC PANEL
ANION GAP: 9 (ref 5–15)
BUN: 33 mg/dL — ABNORMAL HIGH (ref 6–20)
CHLORIDE: 104 mmol/L (ref 101–111)
CO2: 22 mmol/L (ref 22–32)
Calcium: 9.9 mg/dL (ref 8.9–10.3)
Creatinine, Ser: 1.94 mg/dL — ABNORMAL HIGH (ref 0.61–1.24)
GFR calc non Af Amer: 32 mL/min — ABNORMAL LOW (ref >60)
GFR, EST AFRICAN AMERICAN: 37 mL/min — AB (ref >60)
Glucose, Bld: 160 mg/dL — ABNORMAL HIGH (ref 65–99)
POTASSIUM: 4.6 mmol/L (ref 3.5–5.1)
SODIUM: 135 mmol/L (ref 135–145)

## 2015-03-11 LAB — MAGNESIUM: Magnesium: 1.6 mg/dL — ABNORMAL LOW (ref 1.7–2.4)

## 2015-03-11 MED ORDER — MAGNESIUM OXIDE 250 MG PO TABS: 250.0000 mg | ORAL_TABLET | Freq: Two times a day (BID) | ORAL | Status: DC

## 2015-03-11 NOTE — Patient Instructions (Signed)
Cardioversion scheduled for Tuesday, October 25th  - Arrive at the [facility] and go to admitting at [facility] not eat or drink anything after midnight the night prior to your procedure.  - Take all your medication with a sip of water prior to arrival.  - You will not be able to drive home after your procedure.  Follow up appt with Dr. Lovena Le. His scheduler will be in touch with you regarding this appointment.

## 2015-03-11 NOTE — Progress Notes (Signed)
Patient ID: Bill [name], male   DOB: 1941-02-26, 74 y.o.   MRN: [id number]     Primary Care Physician: Bill Bush, MD Referring Physician: Dr. Clarita [name] Bill [name] is a 74 y.o. male with a h/o  HTN, DM, CKD, Aortic stenosis, morbid obesity that underwent loading of Tikosyn on 10/4-10/7. QTc remained stable and he was discharged on 250 mg tikosyn bid. He did require cardioversion before discharge. He states that he does feel better in SR with less dyspnea.  Was seen in [facility] clinic 10/14 and was found to be in afib, rate controlled. He does not feel as well when he is in afib. He felt that maybe the day before went back into afib. Bmet revealed increase in creatinine and K+ with h/o low dose Ace inhibitor just restarted. The ACE was stopped.  He retruns to afib clinic today and  EKG shows afib, rate controlled at 77 bpm, QTc stable at 418 ms. He is taking his tikosyn on a regular basis. Continues on apixaban.   Today, he denies symptoms of palpitations, shortness of breath, orthopnea, PND, lower extremity edema, dizziness, presyncope, syncope, or neurologic sequela. Mildly dyspneic this am. The patient is tolerating medications without difficulties and is otherwise without complaint today.   Past Medical History  Diagnosis Date  . Hypertension   . Type 2 diabetes, controlled, with retinopathy ([place]) 1980s  . Exogenous obesity   . Urge incontinence   . Lyme disease 2012    ?titers negative  . History of chicken pox   . Aortic stenosis 12/2010    a. Echo 12/29/10: Mild LVH, EF 50-55%, normal wall motion, mild aortic stenosis, trivial AI, mean gradient 19 mmHg, mild LAE;  b. Echo 11/13:  mod LVH, vigorous LV EF 65-70%, mod AS, mean 23 mmHg, mild MR, mild BAE, mild RVE  c. echo 08/2013 with mod-severe AS  . Kidney stones remote  . GERD (gastroesophageal reflux disease)     occasional  . Atrial fibrillation ([place])   . Vitamin B12 deficiency     IF normal (2014)   Past Surgical History   Procedure Laterality Date  . Kidney stone removal    . Finger sx    . US echocardiography  12/2010    EF 50-55%, mild LVH, normal wall motion, high LV filling pressures, mild AS, LA mildly dilated  . Back surgery  20 yrs ago    removal of lower back cartilage due to damage. Patient does not remember date of surgery.  . Total hip arthroplasty  03/12/2012    RIGHT TOTAL HIP ARTHROPLASTY ANTERIOR APPROACH;  Surgeon: Bill Rossetti, MD;  . Cataract extraction Bilateral 2015    [name]  . Cardioversion N/A 02/25/2015    Procedure: CARDIOVERSION;  Surgeon: Bill Latch, MD;  Location: [facility];  Service: Cardiovascular;  Laterality: N/A;    Current Outpatient Prescriptions  Medication Sig Dispense Refill  . apixaban (ELIQUIS) 5 MG TABS tablet Take 1 tablet (5 mg total) by mouth 2 (two) times daily. 60 tablet 3  . aspirin EC 81 MG tablet Take 81 mg by mouth daily.    . cholecalciferol (VITAMIN D) 1000 UNITS tablet Take 1,000 Units by mouth daily.    Marland Kitchen dofetilide (TIKOSYN) 250 MCG capsule Take 1 capsule (250 mcg total) by mouth 2 (two) times daily. 60 capsule 11  . furosemide (LASIX) 20 MG tablet Take 1 tablet (20 mg total) by mouth daily.    Marland Kitchen glipiZIDE (  GLUCOTROL) 10 MG tablet Take 1 tablet (10 mg total) by mouth daily before breakfast. 90 tablet 3  . ibuprofen (ADVIL,MOTRIN) 400 MG tablet Take 1 tablet (400 mg total) by mouth 2 (two) times daily as needed. 180 tablet 1  . metFORMIN (GLUCOPHAGE) 500 MG tablet Take 500 mg by mouth 2 (two) times daily with a meal.     . Multiple Vitamins-Minerals (MULTIVITAMIN PO) Multivitamin packet containing 5 tablets.    Marland Kitchen oxybutynin (DITROPAN) 5 MG tablet Take 1 tablet (5 mg total) by mouth 3 (three) times daily. 270 tablet 3  . vitamin B-12 (CYANOCOBALAMIN) 1000 MCG tablet Take 1,000 mcg by mouth daily.     No current facility-administered medications for this encounter.    No Known Allergies  Social History   Social  History  . Marital Status: Married    Spouse Name: N/A  . Number of Children: N/A  . Years of Education: N/A   Occupational History  . Not on file.   Social History Main Topics  . Smoking status: Never Smoker   . Smokeless tobacco: Never Used  . Alcohol Use: Yes     Comment: Regular (bourbon and coke 3-4/day)  . Drug Use: No  . Sexual Activity: No   Other Topics Concern  . Not on file   Social History Narrative   Caffeine: coffee 1 cup/day   Lives with wife Bill [name]), 1 cat   Occupation: retired, worked for [employer]   Edu: BS   Activity: no regular exercise   Diet: some water, fruits/vegetables daily      Advanced directives: has living will at home. Does not want prolonged life support "no extraordinary measures" but would accept temporary measures. Would want wife to be HCPOA.     Family History  Problem Relation Age of Onset  . Diabetes Father   . Diabetes Mother   . Heart disease Father   . Heart disease Mother   . Cancer Brother     throat    ROS- All systems are reviewed and negative except as per the HPI above  Physical Exam: There were no vitals filed for this visit.  GEN- The patient is well appearing, alert and oriented x 3 today.   Head- normocephalic, atraumatic Eyes-  Sclera clear, conjunctiva pink Ears- hearing intact Oropharynx- clear Neck- supple, no JVP Lymph- no cervical lymphadenopathy Lungs- Clear to ausculation bilaterally, normal work of breathing Heart- Mildly irregular rate and rhythm, no murmurs, rubs or gallops, PMI not laterally displaced GI- soft, NT, ND, + BS Extremities- no clubbing, cyanosis, or edema MS- no significant deformity or atrophy Skin- no rash or lesion Psych- euthymic mood, full affect Neuro- strength and sensation are intact  EKG- Afib rate conrtolled at 76 bpm, qrs int 82, qt int 436 ms Epic records reviewed, previous labs and hospitalization  Assessment and Plan: 1. Persistent afib Currently in afib today, rate  controlled Will set up for cardioversion and see if SR will hold before saying tikosyn is a failure.  Bmet/mag pending. Has not missed any doses of  apixaban Is bruising and was d/c on asa and apixaban. I cannot not see any clinical reason for asa, Recent MI, stent, stroke, etc, so will stop asa to reduce bleeding risk.  2. HTN Stable  3. CKD Bmet pending f/u of increase of creatinine and K+ on ACE , now off ace.  F/u cardioversion afib clinic.  Geroge Baseman Carroll, [facility] [address]  [place], [place] [address] [phone]

## 2015-03-16 ENCOUNTER — Encounter (HOSPITAL_COMMUNITY): Payer: Self-pay | Admitting: Anesthesiology

## 2015-03-16 ENCOUNTER — Encounter (HOSPITAL_COMMUNITY): Payer: Self-pay | Admitting: *Deleted

## 2015-03-16 ENCOUNTER — Encounter (HOSPITAL_COMMUNITY)
Admission: RE | Disposition: A | Discharge status: Home / Self Care | Payer: Self-pay | Source: Ambulatory Visit | Attending: Internal Medicine

## 2015-03-16 ENCOUNTER — Ambulatory Visit (HOSPITAL_COMMUNITY)
Admission: RE | Admit: 2015-03-16 | Discharge: 2015-03-16 | Disposition: A | Discharge status: Home / Self Care | Payer: PPO | Source: Ambulatory Visit | Attending: Internal Medicine | Admitting: Internal Medicine

## 2015-03-16 DIAGNOSIS — Z538 Procedure and treatment not carried out for other reasons: Secondary | ICD-10-CM | POA: Insufficient documentation

## 2015-03-16 DIAGNOSIS — I4891 Unspecified atrial fibrillation: Secondary | ICD-10-CM | POA: Insufficient documentation

## 2015-03-16 DIAGNOSIS — I480 Paroxysmal atrial fibrillation: Secondary | ICD-10-CM

## 2015-03-16 SURGERY — CANCELLED PROCEDURE

## 2015-03-16 MED ORDER — SODIUM CHLORIDE 0.9 % IV SOLN: INTRAVENOUS | Status: DC

## 2015-03-16 NOTE — Progress Notes (Signed)
Patient admitted to Endo Unit. EKG requested and patient shown to be in NSR with 1st degree block. Patient informed to keep follow up appointment at [facility] clinic and to take medications as prescribed. Patient and wife with understanding. Patient discharged home.

## 2015-03-16 NOTE — Progress Notes (Signed)
I have reviewed the patient's EKG. He appears to be in a low-atrial or sinus (with biphasic p-waves) rhythm today. Long 1st degree AVB. QTc is 441 msec. The cardioversion was therefore cancelled. Follow-up with Roderic Palau, NP in the a-fib clinic.  Pixie Casino, MD, [facility] Attending Cardiologist [place]

## 2015-03-26 ENCOUNTER — Ambulatory Visit (HOSPITAL_COMMUNITY)
Admit: 2015-03-26 | Discharge: 2015-03-26 | Disposition: A | Discharge status: Home / Self Care | Payer: PPO | Source: Ambulatory Visit | Attending: Nurse Practitioner | Admitting: Nurse Practitioner

## 2015-03-26 ENCOUNTER — Other Ambulatory Visit: Payer: Self-pay

## 2015-03-26 ENCOUNTER — Encounter (HOSPITAL_COMMUNITY): Payer: Self-pay | Admitting: Nurse Practitioner

## 2015-03-26 VITALS — BP 124/68 | HR 75 | Ht 74.0 in | Wt 262.6 lb

## 2015-03-26 DIAGNOSIS — I129 Hypertensive chronic kidney disease with stage 1 through stage 4 chronic kidney disease, or unspecified chronic kidney disease: Secondary | ICD-10-CM | POA: Insufficient documentation

## 2015-03-26 DIAGNOSIS — N189 Chronic kidney disease, unspecified: Secondary | ICD-10-CM | POA: Diagnosis not present

## 2015-03-26 DIAGNOSIS — K5900 Constipation, unspecified: Secondary | ICD-10-CM | POA: Diagnosis not present

## 2015-03-26 DIAGNOSIS — I481 Persistent atrial fibrillation: Secondary | ICD-10-CM | POA: Diagnosis not present

## 2015-03-26 DIAGNOSIS — I4819 Other persistent atrial fibrillation: Secondary | ICD-10-CM

## 2015-03-26 LAB — BASIC METABOLIC PANEL
ANION GAP: 8 (ref 5–15)
BUN: 26 mg/dL — AB (ref 6–20)
CALCIUM: 10 mg/dL (ref 8.9–10.3)
CO2: 25 mmol/L (ref 22–32)
CREATININE: 1.66 mg/dL — AB (ref 0.61–1.24)
Chloride: 104 mmol/L (ref 101–111)
GFR calc Af Amer: 45 mL/min — ABNORMAL LOW (ref >60)
GFR, EST NON AFRICAN AMERICAN: 38 mL/min — AB (ref >60)
GLUCOSE: 170 mg/dL — AB (ref 65–99)
Potassium: 4.6 mmol/L (ref 3.5–5.1)
Sodium: 137 mmol/L (ref 135–145)

## 2015-03-26 LAB — MAGNESIUM: MAGNESIUM: 1.9 mg/dL (ref 1.7–2.4)

## 2015-03-26 NOTE — Progress Notes (Signed)
Patient ID: Bill [name], male   DOB: 05-29-1940, 74 y.o.   MRN: [id number]    Primary Care Physician: [name], MD Referring Physician: Dr. Clarita Leber Bill [name] is a 74 y.o. male with a h/o  HTN, DM, CKD, Aortic stenosis, morbid obesity that underwent loading of Tikosyn on 10/4-10/7. QTc remained stable and he was discharged on 250 mg tikosyn bid. He did require cardioversion before discharge. He states that he does feel better in SR with less dyspnea.  Was seen in [place] clinic 10/14 and was found to be in afib, rate controlled. He does not feel as well when he is in afib. He felt that maybe the day before went back into afib. Bmet revealed increase in creatinine and K+ with h/o low dose Ace inhibitor just restarted. The ACE was stopped.   He was set up for cardioversion after the 2nd f/u to afib clinic with EKG showing afib. He presented in normal rhythm at time of cardioversion, and today in there clinic is in sinus rhythm. He feels as though he has been in SR, for the majority of the time. He does not feel the palpitations but feels less shortness of breath when is SR. He is c/o of more constipation since staring tiksoyn. Mag was low at 1.6, mag supplement was started.  Today, he denies symptoms of palpitations, shortness of breath, orthopnea, PND, lower extremity edema, dizziness, presyncope, syncope, or neurologic sequela. Mildly dyspneic this am. The patient is tolerating medications without difficulties and is otherwise without complaint today.   Past Medical History  Diagnosis Date  . Hypertension   . Type 2 diabetes, controlled, with retinopathy ([place]) 1980s  . Exogenous obesity   . Urge incontinence   . Lyme disease 2012    ?titers negative  . History of chicken pox   . Aortic stenosis 12/2010    a. Echo 12/29/10: Mild LVH, EF 50-55%, normal wall motion, mild aortic stenosis, trivial AI, mean gradient 19 mmHg, mild LAE;  b. Echo 11/13:  mod LVH, vigorous LV EF 65-70%, mod  AS, mean 23 mmHg, mild MR, mild BAE, mild RVE  c. echo 08/2013 with mod-severe AS  . Kidney stones remote  . GERD (gastroesophageal reflux disease)     occasional  . Atrial fibrillation ([place])   . Vitamin B12 deficiency     IF normal (2014)   Past Surgical History  Procedure Laterality Date  . Kidney stone removal    . Finger sx    . US echocardiography  12/2010    EF 50-55%, mild LVH, normal wall motion, high LV filling pressures, mild AS, LA mildly dilated  . Back surgery  20 yrs ago    removal of lower back cartilage due to damage. Patient does not remember date of surgery.  . Total hip arthroplasty  03/12/2012    RIGHT TOTAL HIP ARTHROPLASTY ANTERIOR APPROACH;  Surgeon: Mcarthur Rossetti, MD;  . Cataract extraction Bilateral 2015    [facility]  . Cardioversion N/A 02/25/2015    Procedure: CARDIOVERSION;  Surgeon: Skeet Latch, MD;  Location: [facility];  Service: Cardiovascular;  Laterality: N/A;    Current Outpatient Prescriptions  Medication Sig Dispense Refill  . apixaban (ELIQUIS) 5 MG TABS tablet Take 1 tablet (5 mg total) by mouth 2 (two) times daily. 60 tablet 3  . cholecalciferol (VITAMIN D) 1000 UNITS tablet Take 1,000 Units by mouth daily.    Marland Kitchen dofetilide (TIKOSYN) 250 MCG capsule Take 1  capsule (250 mcg total) by mouth 2 (two) times daily. 60 capsule 11  . furosemide (LASIX) 20 MG tablet Take 1 tablet (20 mg total) by mouth daily.    Marland Kitchen glipiZIDE (GLUCOTROL) 10 MG tablet Take 1 tablet (10 mg total) by mouth daily before breakfast. 90 tablet 3  . ibuprofen (ADVIL,MOTRIN) 400 MG tablet Take 1 tablet (400 mg total) by mouth 2 (two) times daily as needed. 180 tablet 1  . Magnesium Oxide 250 MG TABS Take 1 tablet (250 mg total) by mouth 2 (two) times daily. 60 tablet 3  . metFORMIN (GLUCOPHAGE) 500 MG tablet Take 500 mg by mouth 2 (two) times daily with a meal.     . Multiple Vitamin (MULTIVITAMIN WITH MINERALS) TABS tablet Take by mouth daily. 1 packet  of 5 vitamins daily    . multivitamin-lutein (OCUVITE-LUTEIN) CAPS capsule Take 3 capsules by mouth daily.    Marland Kitchen oxybutynin (DITROPAN) 5 MG tablet Take 1 tablet (5 mg total) by mouth 3 (three) times daily. (Patient taking differently: Take 5 mg by mouth 2 (two) times daily. ) 270 tablet 3  . vitamin B-12 (CYANOCOBALAMIN) 1000 MCG tablet Take 1,000 mcg by mouth daily.     No current facility-administered medications for this encounter.    No Known Allergies  Social History   Social History  . Marital Status: Married    Spouse Name: N/A  . Number of Children: N/A  . Years of Education: N/A   Occupational History  . Not on file.   Social History Main Topics  . Smoking status: Never Smoker   . Smokeless tobacco: Never Used  . Alcohol Use: Yes     Comment: Regular (bourbon and coke 3-4/day)  . Drug Use: No  . Sexual Activity: No   Other Topics Concern  . Not on file   Social History Narrative   Caffeine: coffee 1 cup/day   Lives with wife [name]), 1 cat   Occupation: retired, worked for [employer]   Edu: BS   Activity: no regular exercise   Diet: some water, fruits/vegetables daily      Advanced directives: has living will at home. Does not want prolonged life support "no extraordinary measures" but would accept temporary measures. Would want wife to be HCPOA.     Family History  Problem Relation Age of Onset  . Diabetes Father   . Diabetes Mother   . Heart disease Father   . Heart disease Mother   . Cancer Brother     throat    ROS- All systems are reviewed and negative except as per the HPI above  Physical Exam: Filed Vitals:   03/26/15 0928  BP: 124/68  Pulse: 75  Height: 6\' 2"  (1.88 m)  Weight: 262 lb 9.6 oz (119.115 kg)    GEN- The patient is well appearing, alert and oriented x 3 today.   Head- normocephalic, atraumatic Eyes-  Sclera clear, conjunctiva pink Ears- hearing intact Oropharynx- clear Neck- supple, no JVP Lymph- no cervical  lymphadenopathy Lungs- Clear to ausculation bilaterally, normal work of breathing Heart- Regular rate and rhythm, no murmurs, rubs or gallops, PMI not laterally displaced GI- soft, NT, ND, + BS Extremities- no clubbing, cyanosis, or edema MS- no significant deformity or atrophy Skin- no rash or lesion Psych- euthymic mood, full affect Neuro- strength and sensation are intact  EKG-SR with first degree AV block,RAD, pr int 416 ms, QRS int 78 ms, Qtc 446 ms. Epic records reviewed, previous labs and hospitalization  Assessment and Plan: 1. Persistent afib Currently in SR Has not missed any doses of  apixaban Continue tikosyn 250 mg bid Bmet/mag today  2. HTN Stable  3. CKD Bmet pending   4. Constipation To discuss with pcp  F/u  afib clinic in one month.  Geroge Baseman Damariz Paganelli, [facility] [id number] [place], [place] [address] [phone]

## 2015-04-23 ENCOUNTER — Encounter (HOSPITAL_COMMUNITY): Payer: Self-pay | Admitting: Nurse Practitioner

## 2015-04-23 ENCOUNTER — Ambulatory Visit (HOSPITAL_COMMUNITY)
Admission: RE | Admit: 2015-04-23 | Discharge: 2015-04-23 | Disposition: A | Discharge status: Home / Self Care | Payer: PPO | Source: Ambulatory Visit | Attending: Nurse Practitioner | Admitting: Nurse Practitioner

## 2015-04-23 VITALS — BP 152/76 | HR 74 | Ht 74.0 in | Wt 270.0 lb

## 2015-04-23 DIAGNOSIS — N189 Chronic kidney disease, unspecified: Secondary | ICD-10-CM | POA: Insufficient documentation

## 2015-04-23 DIAGNOSIS — I10 Essential (primary) hypertension: Secondary | ICD-10-CM | POA: Diagnosis not present

## 2015-04-23 DIAGNOSIS — I481 Persistent atrial fibrillation: Secondary | ICD-10-CM

## 2015-04-23 DIAGNOSIS — I4819 Other persistent atrial fibrillation: Secondary | ICD-10-CM

## 2015-04-23 LAB — MAGNESIUM: Magnesium: 1.9 mg/dL (ref 1.7–2.4)

## 2015-04-23 LAB — BASIC METABOLIC PANEL
ANION GAP: 7 (ref 5–15)
BUN: 24 mg/dL — ABNORMAL HIGH (ref 6–20)
CHLORIDE: 105 mmol/L (ref 101–111)
CO2: 27 mmol/L (ref 22–32)
Calcium: 10.1 mg/dL (ref 8.9–10.3)
Creatinine, Ser: 1.72 mg/dL — ABNORMAL HIGH (ref 0.61–1.24)
GFR calc non Af Amer: 37 mL/min — ABNORMAL LOW (ref >60)
GFR, EST AFRICAN AMERICAN: 43 mL/min — AB (ref >60)
GLUCOSE: 163 mg/dL — AB (ref 65–99)
POTASSIUM: 4.8 mmol/L (ref 3.5–5.1)
Sodium: 139 mmol/L (ref 135–145)

## 2015-04-23 NOTE — Progress Notes (Signed)
Patient ID: BILLY FORCE, male   DOB: 02-11-1941, 74 y.o.   MRN: [id number]     Primary Care Physician: [name], MD Referring Physician: Dr. Clarita Leber Bill [name] is a 74 y.o. male with a h/o  HTN, DM, CKD, Aortic stenosis, morbid obesity that underwent loading of Tikosyn on 10/4-10/7. QTc remained stable and he was discharged on 250 mg tikosyn bid. He did require cardioversion before discharge. He states that he does feel better in SR with less dyspnea.  He is taking his tikosyn on a regular basis. Continues on apixaban. Had some constipation x 2 weeks after starting tikosyn but corrected itself without meds.  Today, he denies symptoms of palpitations, shortness of breath, orthopnea, PND, lower extremity edema, dizziness, presyncope, syncope, or neurologic sequela. Mildly dyspneic this am. The patient is tolerating medications without difficulties and is otherwise without complaint today.   Past Medical History  Diagnosis Date  . Hypertension   . Type 2 diabetes, controlled, with retinopathy ([place]) 1980s  . Exogenous obesity   . Urge incontinence   . Lyme disease 2012    ?titers negative  . History of chicken pox   . Aortic stenosis 12/2010    a. Echo 12/29/10: Mild LVH, EF 50-55%, normal wall motion, mild aortic stenosis, trivial AI, mean gradient 19 mmHg, mild LAE;  b. Echo 11/13:  mod LVH, vigorous LV EF 65-70%, mod AS, mean 23 mmHg, mild MR, mild BAE, mild RVE  c. echo 08/2013 with mod-severe AS  . Kidney stones remote  . GERD (gastroesophageal reflux disease)     occasional  . Atrial fibrillation ([place])   . Vitamin B12 deficiency     IF normal (2014)   Past Surgical History  Procedure Laterality Date  . Kidney stone removal    . Finger sx    . US echocardiography  12/2010    EF 50-55%, mild LVH, normal wall motion, high LV filling pressures, mild AS, LA mildly dilated  . Back surgery  20 yrs ago    removal of lower back cartilage due to damage. Patient does not  remember date of surgery.  . Total hip arthroplasty  03/12/2012    RIGHT TOTAL HIP ARTHROPLASTY ANTERIOR APPROACH;  Surgeon: Mcarthur Rossetti, MD;  . Cataract extraction Bilateral 2015    [facility]  . Cardioversion N/A 02/25/2015    Procedure: CARDIOVERSION;  Surgeon: Skeet Latch, MD;  Location: [facility];  Service: Cardiovascular;  Laterality: N/A;    Current Outpatient Prescriptions  Medication Sig Dispense Refill  . apixaban (ELIQUIS) 5 MG TABS tablet Take 1 tablet (5 mg total) by mouth 2 (two) times daily. 60 tablet 3  . cholecalciferol (VITAMIN D) 1000 UNITS tablet Take 1,000 Units by mouth daily.    Marland Kitchen dofetilide (TIKOSYN) 250 MCG capsule Take 1 capsule (250 mcg total) by mouth 2 (two) times daily. 60 capsule 11  . furosemide (LASIX) 20 MG tablet Take 1 tablet (20 mg total) by mouth daily.    Marland Kitchen glipiZIDE (GLUCOTROL) 10 MG tablet Take 1 tablet (10 mg total) by mouth daily before breakfast. 90 tablet 3  . ibuprofen (ADVIL,MOTRIN) 400 MG tablet Take 1 tablet (400 mg total) by mouth 2 (two) times daily as needed. 180 tablet 1  . Magnesium Oxide 250 MG TABS Take 1 tablet (250 mg total) by mouth 2 (two) times daily. 60 tablet 3  . metFORMIN (GLUCOPHAGE) 500 MG tablet Take 500 mg by mouth 2 (two) times  daily with a meal.     . Multiple Vitamin (MULTIVITAMIN WITH MINERALS) TABS tablet Take by mouth daily. 1 packet of 5 vitamins daily    . multivitamin-lutein (OCUVITE-LUTEIN) CAPS capsule Take 3 capsules by mouth daily.    Marland Kitchen oxybutynin (DITROPAN) 5 MG tablet Take 1 tablet (5 mg total) by mouth 3 (three) times daily. (Patient taking differently: Take 5 mg by mouth 2 (two) times daily. ) 270 tablet 3  . vitamin B-12 (CYANOCOBALAMIN) 1000 MCG tablet Take 1,000 mcg by mouth daily.     No current facility-administered medications for this encounter.    No Known Allergies  Social History   Social History  . Marital Status: Married    Spouse Name: N/A  . Number of  Children: N/A  . Years of Education: N/A   Occupational History  . Not on file.   Social History Main Topics  . Smoking status: Never Smoker   . Smokeless tobacco: Never Used  . Alcohol Use: Yes     Comment: Regular (bourbon and coke 3-4/day)  . Drug Use: No  . Sexual Activity: No   Other Topics Concern  . Not on file   Social History Narrative   Caffeine: coffee 1 cup/day   Lives with wife Bill Penny), 1 cat   Occupation: retired, worked for [employer]   Edu: BS   Activity: no regular exercise   Diet: some water, fruits/vegetables daily      Advanced directives: has living will at home. Does not want prolonged life support "no extraordinary measures" but would accept temporary measures. Would want wife to be HCPOA.     Family History  Problem Relation Age of Onset  . Diabetes Father   . Diabetes Mother   . Heart disease Father   . Heart disease Mother   . Cancer Brother     throat    ROS- All systems are reviewed and negative except as per the HPI above  Physical Exam: Filed Vitals:   04/23/15 0935  BP: 152/76  Pulse: 74  Height: 6\' 2"  (1.88 m)  Weight: 270 lb (122.471 kg)    GEN- The patient is well appearing, alert and oriented x 3 today.   Head- normocephalic, atraumatic Eyes-  Sclera clear, conjunctiva pink Ears- hearing intact Oropharynx- clear Neck- supple, no JVP Lymph- no cervical lymphadenopathy Lungs- Clear to ausculation bilaterally, normal work of breathing Heart- Mildly irregular rate and rhythm, no murmurs, rubs or gallops, PMI not laterally displaced GI- soft, NT, ND, + BS Extremities- no clubbing, cyanosis, or edema MS- no significant deformity or atrophy Skin- no rash or lesion Psych- euthymic mood, full affect Neuro- strength and sensation are intact  EKG-undetermined rhythm, regular rate at 74 bpm, ? First degree av block. LAD deviation, QRS int 82 bpm, QTc 460ms Epic records reviewed   Assessment and Plan: 1. Persistent afib  Currently  in sinus rhythm with a long first degree av block, vrs junctional rhtym Bmet/mag pending, mag low at last  check and supplement started Continue tiksoyn 250 mg bid  Continue apixaban  2. HTN Slighlty elevated today Avoid salt  3. CKD Bmet pending  See Dr. Lovena Le 1/10 Afib clinic as needed  Bill [name], [facility] [address] [place], [place] [address] [phone]

## 2015-06-01 ENCOUNTER — Ambulatory Visit (INDEPENDENT_AMBULATORY_CARE_PROVIDER_SITE_OTHER): Payer: PPO | Admitting: Internal Medicine

## 2015-06-01 ENCOUNTER — Encounter: Payer: Self-pay | Admitting: Internal Medicine

## 2015-06-01 VITALS — BP 120/82 | HR 72 | Ht 74.0 in | Wt 270.2 lb

## 2015-06-01 DIAGNOSIS — I350 Nonrheumatic aortic (valve) stenosis: Secondary | ICD-10-CM

## 2015-06-01 DIAGNOSIS — I482 Chronic atrial fibrillation, unspecified: Secondary | ICD-10-CM

## 2015-06-01 DIAGNOSIS — I10 Essential (primary) hypertension: Secondary | ICD-10-CM

## 2015-06-01 DIAGNOSIS — I5032 Chronic diastolic (congestive) heart failure: Secondary | ICD-10-CM

## 2015-06-01 NOTE — Assessment & Plan Note (Signed)
His symptoms are class 2 and multifactorial. He will continue his current meds.

## 2015-06-01 NOTE — Assessment & Plan Note (Signed)
His blood pressure is well controlled. He will continue his current meds. 

## 2015-06-01 NOTE — Patient Instructions (Signed)
Medication Instructions:  Your physician recommends that you continue on your current medications as directed. Please refer to the Current Medication list given to you today.   Labwork: None ordered   Testing/Procedures: Your physician has requested that you have an echocardiogram. Echocardiography is a painless test that uses sound waves to create images of your heart. It provides your doctor with information about the size and shape of your heart and how well your heart's chambers and valves are working. This procedure takes approximately one hour. There are no restrictions for this procedure.---in June 2017    Follow-Up: Your physician recommends that you schedule a follow-up appointment in: June with Dr Lovena Le after echo   Any Other Special Instructions Will Be Listed Below (If Applicable).     If you need a refill on your cardiac medications before your next appointment, please call your pharmacy.

## 2015-06-01 NOTE — Progress Notes (Signed)
HPI [name] returns today for EP followup. He is a morbidly obese man who has developed worsening sob. He has class 3A symptoms and recent 2D echo demonstrated that the patient had mod-severe aortic stenosis and mild mitral regurgitation. He has preserved LV function. He has had atrial fibrillation and previously refused anti-coagulation except for ASA. He was placed on Tikosyn therapy several weeks ago. He feels better but still weak and fatigued.  He returns today for followup. He has many questions about his hospital bill.  No Known Allergies   Current Outpatient Prescriptions  Medication Sig Dispense Refill  . apixaban (ELIQUIS) 5 MG TABS tablet Take 1 tablet (5 mg total) by mouth 2 (two) times daily. 60 tablet 3  . cholecalciferol (VITAMIN D) 1000 UNITS tablet Take 1,000 Units by mouth daily.    Marland Kitchen dofetilide (TIKOSYN) 250 MCG capsule Take 1 capsule (250 mcg total) by mouth 2 (two) times daily. 60 capsule 11  . furosemide (LASIX) 20 MG tablet Take 1 tablet (20 mg total) by mouth daily.    Marland Kitchen glipiZIDE (GLUCOTROL) 10 MG tablet Take 1 tablet (10 mg total) by mouth daily before breakfast. 90 tablet 3  . ibuprofen (ADVIL,MOTRIN) 400 MG tablet Take 1 tablet (400 mg total) by mouth 2 (two) times daily as needed. 180 tablet 1  . Magnesium Oxide 250 MG TABS Take 1 tablet by mouth daily.    . metFORMIN (GLUCOPHAGE) 500 MG tablet Take 500 mg by mouth 2 (two) times daily with a meal.     . Multiple Vitamin (MULTIVITAMIN WITH MINERALS) TABS tablet Take by mouth daily. 1 packet of 5 vitamins daily    . multivitamin-lutein (OCUVITE-LUTEIN) CAPS capsule Take 3 capsules by mouth daily.    Marland Kitchen oxybutynin (DITROPAN) 5 MG tablet Take 1 tablet (5 mg total) by mouth 3 (three) times daily. 270 tablet 3  . vitamin B-12 (CYANOCOBALAMIN) 1000 MCG tablet Take 1,000 mcg by mouth daily.     No current facility-administered medications for this visit.     Past Medical History  Diagnosis Date  .  Hypertension   . Type 2 diabetes, controlled, with retinopathy ([place]) 1980s  . Exogenous obesity   . Urge incontinence   . Lyme disease 2012    ?titers negative  . History of chicken pox   . Aortic stenosis 12/2010    a. Echo 12/29/10: Mild LVH, EF 50-55%, normal wall motion, mild aortic stenosis, trivial AI, mean gradient 19 mmHg, mild LAE;  b. Echo 11/13:  mod LVH, vigorous LV EF 65-70%, mod AS, mean 23 mmHg, mild MR, mild BAE, mild RVE  c. echo 08/2013 with mod-severe AS  . Kidney stones remote  . GERD (gastroesophageal reflux disease)     occasional  . Atrial fibrillation ([place])   . Vitamin B12 deficiency     IF normal (2014)    ROS:   All systems reviewed and negative except as noted in the HPI.   Past Surgical History  Procedure Laterality Date  . Kidney stone removal    . Finger sx    . US echocardiography  12/2010    EF 50-55%, mild LVH, normal wall motion, high LV filling pressures, mild AS, LA mildly dilated  . Back surgery  20 yrs ago    removal of lower back cartilage due to damage. Patient does not remember date of surgery.  . Total hip arthroplasty  03/12/2012    RIGHT TOTAL HIP ARTHROPLASTY ANTERIOR APPROACH;  Surgeon: Mcarthur Rossetti, MD;  . Cataract extraction Bilateral 2015    [facility]  . Cardioversion N/A 02/25/2015    Procedure: CARDIOVERSION;  Surgeon: Skeet Latch, MD;  [facility]: [facility] END[facility]COPY;  Service: Cardiovascular;  Laterality: N/A;     Family History  Problem Relation Age of Onset  . Diabetes Father   . Diabetes Mother   . Heart disease Father   . Heart disease Mother   . Cancer Brother     throat     Social History   Social History  . Marital Status: Married    Spouse Name: N/A  . Number of Children: N/A  . Years of Education: N/A   Occupational History  . Not on file.   Social History Main Topics  . Smoking status: Never Smoker   . Smokeless tobacco: Never Used  . Alcohol Use: Yes     Comment: Regular  (bourbon and coke 3-4/day)  . Drug Use: No  . Sexual Activity: No   Other Topics Concern  . Not on file   Social History Narrative   Caffeine: coffee 1 cup/day   Lives with wife Bill [name]), 1 cat   Occupation: retired, worked for [employer]   Edu: BS   Activity: no regular exercise   Diet: some water, fruits/vegetables daily      Advanced directives: has living will at home. Does not want prolonged life support "no extraordinary measures" but would accept temporary measures. Would want wife to be HCPOA.      BP 120/82 mmHg  Pulse 72  Ht 6\' 2"  (1.88 m)  Wt 270 lb 3.2 oz (122.562 kg)  BMI 34.68 kg/m2  Physical Exam:  Well appearing 75 yo man, NAD HEENT: Unremarkable Neck:  7 JVD, no thyromegally Back:  No CVA tenderness Lungs:  Scattered rales with no wheezes HEART:  Regular rate rhythm, 3/6 AS murmur, no rubs, no clicks Abd:  soft, positive bowel sounds, no organomegally, no rebound, no guarding Ext:  2 plus pulses, trace peripheral edema, no cyanosis, no clubbing Skin:  No rashes no nodules Neuro:  CN II through XII intact, motor grossly intact  EKG - NSR with first degree AV block   Assess/Plan:

## 2015-06-01 NOTE — Assessment & Plan Note (Signed)
He is maintaining NSR. He will continue Tikosyn. QT ok.

## 2015-06-01 NOTE — Assessment & Plan Note (Signed)
He will repeat his 2D echo in 6 months, a year after his last echo.

## 2015-06-28 DIAGNOSIS — H43392 Other vitreous opacities, left eye: Secondary | ICD-10-CM | POA: Diagnosis not present

## 2015-06-28 DIAGNOSIS — H4312 Vitreous hemorrhage, left eye: Secondary | ICD-10-CM | POA: Diagnosis not present

## 2015-06-29 ENCOUNTER — Encounter: Payer: Self-pay | Admitting: Internal Medicine

## 2015-06-29 ENCOUNTER — Encounter: Payer: Self-pay | Admitting: Family Medicine

## 2015-06-29 DIAGNOSIS — H43811 Vitreous degeneration, right eye: Secondary | ICD-10-CM | POA: Diagnosis not present

## 2015-06-29 DIAGNOSIS — H4312 Vitreous hemorrhage, left eye: Secondary | ICD-10-CM | POA: Diagnosis not present

## 2015-06-29 DIAGNOSIS — E113593 Type 2 diabetes mellitus with proliferative diabetic retinopathy without macular edema, bilateral: Secondary | ICD-10-CM | POA: Diagnosis not present

## 2015-06-30 ENCOUNTER — Telehealth: Payer: Self-pay | Admitting: Internal Medicine

## 2015-06-30 ENCOUNTER — Telehealth: Payer: Self-pay | Admitting: Family Medicine

## 2015-06-30 MED ORDER — GLIPIZIDE 10 MG PO TABS: ORAL_TABLET | ORAL | Status: DC

## 2015-06-30 NOTE — Telephone Encounter (Signed)
Pt left v/m; pt left my chart message on 06/29/15 and pt request response to message by mychart or call [phone]. Pt is to have surgery to remove blood from eye that is affecting pt's vision. Please advise. FBS this morning was 200. Dr Darnell Level is out of office this afternoon and Maudie Mercury CMA is not sure if Dr Darnell Level will be reviewing desktop; will send note to Dr Darnell Level and Dr Damita Dunnings who is in the office.

## 2015-06-30 NOTE — Telephone Encounter (Signed)
Pt is on Eliquis for afib with CHADS2 score of 4 and no history of stroke or TIA. Ok to hold Eliquis for 3 days since procedure is in a highly vascular area. Clearance faxed 06/30/15.

## 2015-06-30 NOTE — Telephone Encounter (Signed)
replied via my chart

## 2015-06-30 NOTE — Telephone Encounter (Signed)
I will ask for Dr. Synthia Innocent input on this.   I didn't know if he wanted to change the rx before rechecking an A1c.   I sent mychart message to patient in meantime.

## 2015-06-30 NOTE — Telephone Encounter (Signed)
Glipizide changed. See pt email.

## 2015-06-30 NOTE — Telephone Encounter (Signed)
New message      Request for surgical clearance:  1. What type of surgery is being performed?  Eye surgery  2. When is this surgery scheduled? 07-05-15  Are there any medications that need to be held prior to surgery and how long? Hold eliquis 3 days prior 3. Name of physician performing surgery? Dr Sherlynn Stalls  4. What is your office phone and fax number? Fax [phone]

## 2015-07-02 ENCOUNTER — Telehealth: Payer: Self-pay | Admitting: Internal Medicine

## 2015-07-02 NOTE — Telephone Encounter (Signed)
A different fax number was routed to me in the original request. Will resend clearance to this fax number.

## 2015-07-02 NOTE — Telephone Encounter (Signed)
New Message: Bill [name] called in stating that she did not receive the fax that was sent on 2/8 and she would like this sent again to fax number [phone].  Thanks

## 2015-07-05 DIAGNOSIS — H4312 Vitreous hemorrhage, left eye: Secondary | ICD-10-CM | POA: Diagnosis not present

## 2015-07-05 DIAGNOSIS — E113592 Type 2 diabetes mellitus with proliferative diabetic retinopathy without macular edema, left eye: Secondary | ICD-10-CM | POA: Diagnosis not present

## 2015-07-06 DIAGNOSIS — E113593 Type 2 diabetes mellitus with proliferative diabetic retinopathy without macular edema, bilateral: Secondary | ICD-10-CM | POA: Diagnosis not present

## 2015-07-13 NOTE — Telephone Encounter (Signed)
Yes - it was sent on 2/10.

## 2015-07-13 NOTE — Telephone Encounter (Signed)
Thank you :)

## 2015-08-03 DIAGNOSIS — E113593 Type 2 diabetes mellitus with proliferative diabetic retinopathy without macular edema, bilateral: Secondary | ICD-10-CM | POA: Diagnosis not present

## 2015-08-03 DIAGNOSIS — H4312 Vitreous hemorrhage, left eye: Secondary | ICD-10-CM | POA: Diagnosis not present

## 2015-08-04 LAB — HM DIABETES EYE EXAM

## 2015-08-12 DIAGNOSIS — Z9842 Cataract extraction status, left eye: Secondary | ICD-10-CM | POA: Diagnosis not present

## 2015-08-12 DIAGNOSIS — E113593 Type 2 diabetes mellitus with proliferative diabetic retinopathy without macular edema, bilateral: Secondary | ICD-10-CM | POA: Diagnosis not present

## 2015-08-12 DIAGNOSIS — H40023 Open angle with borderline findings, high risk, bilateral: Secondary | ICD-10-CM | POA: Diagnosis not present

## 2015-08-12 DIAGNOSIS — Z9841 Cataract extraction status, right eye: Secondary | ICD-10-CM | POA: Diagnosis not present

## 2015-09-06 ENCOUNTER — Telehealth: Payer: Self-pay | Admitting: Family Medicine

## 2015-09-08 ENCOUNTER — Other Ambulatory Visit (INDEPENDENT_AMBULATORY_CARE_PROVIDER_SITE_OTHER): Payer: PPO

## 2015-09-08 ENCOUNTER — Ambulatory Visit (INDEPENDENT_AMBULATORY_CARE_PROVIDER_SITE_OTHER): Payer: PPO

## 2015-09-08 ENCOUNTER — Other Ambulatory Visit: Payer: Self-pay | Admitting: Family Medicine

## 2015-09-08 VITALS — BP 118/68 | HR 72 | Temp 98.6°F | Ht 73.0 in | Wt 268.5 lb

## 2015-09-08 DIAGNOSIS — Z0000 Encounter for general adult medical examination without abnormal findings: Secondary | ICD-10-CM | POA: Diagnosis not present

## 2015-09-08 DIAGNOSIS — E1121 Type 2 diabetes mellitus with diabetic nephropathy: Secondary | ICD-10-CM | POA: Diagnosis not present

## 2015-09-08 DIAGNOSIS — E1122 Type 2 diabetes mellitus with diabetic chronic kidney disease: Secondary | ICD-10-CM

## 2015-09-08 DIAGNOSIS — I10 Essential (primary) hypertension: Secondary | ICD-10-CM

## 2015-09-08 DIAGNOSIS — E538 Deficiency of other specified B group vitamins: Secondary | ICD-10-CM | POA: Diagnosis not present

## 2015-09-08 DIAGNOSIS — E1142 Type 2 diabetes mellitus with diabetic polyneuropathy: Secondary | ICD-10-CM

## 2015-09-08 DIAGNOSIS — E11319 Type 2 diabetes mellitus with unspecified diabetic retinopathy without macular edema: Secondary | ICD-10-CM | POA: Diagnosis not present

## 2015-09-08 DIAGNOSIS — N183 Chronic kidney disease, stage 3 unspecified: Secondary | ICD-10-CM

## 2015-09-08 DIAGNOSIS — E559 Vitamin D deficiency, unspecified: Secondary | ICD-10-CM

## 2015-09-08 DIAGNOSIS — E785 Hyperlipidemia, unspecified: Secondary | ICD-10-CM | POA: Diagnosis not present

## 2015-09-08 LAB — RENAL FUNCTION PANEL
ALBUMIN: 3.9 g/dL (ref 3.5–5.2)
BUN: 24 mg/dL — ABNORMAL HIGH (ref 6–23)
CALCIUM: 9.9 mg/dL (ref 8.4–10.5)
CHLORIDE: 103 meq/L (ref 96–112)
CO2: 28 meq/L (ref 19–32)
Creatinine, Ser: 1.6 mg/dL — ABNORMAL HIGH (ref 0.40–1.50)
GFR: 44.79 mL/min — AB (ref >60.00)
Glucose, Bld: 152 mg/dL — ABNORMAL HIGH (ref 70–99)
POTASSIUM: 5.1 meq/L (ref 3.5–5.1)
Phosphorus: 3.3 mg/dL (ref 2.3–4.6)
Sodium: 138 mEq/L (ref 135–145)

## 2015-09-08 LAB — CBC WITH DIFFERENTIAL/PLATELET
BASOS ABS: 0 10*3/uL (ref 0.0–0.1)
Basophils Relative: 0.3 % (ref 0.0–3.0)
EOS PCT: 2.9 % (ref 0.0–5.0)
Eosinophils Absolute: 0.2 10*3/uL (ref 0.0–0.7)
HEMATOCRIT: 38.1 % — AB (ref 39.0–52.0)
Hemoglobin: 12.7 g/dL — ABNORMAL LOW (ref 13.0–17.0)
LYMPHS ABS: 1 10*3/uL (ref 0.7–4.0)
LYMPHS PCT: 17 % (ref 12.0–46.0)
MCHC: 33.5 g/dL (ref 30.0–36.0)
MCV: 97.5 fl (ref 78.0–100.0)
MONOS PCT: 10.9 % (ref 3.0–12.0)
Monocytes Absolute: 0.6 10*3/uL (ref 0.1–1.0)
NEUTROS ABS: 4 10*3/uL (ref 1.4–7.7)
NEUTROS PCT: 68.9 % (ref 43.0–77.0)
PLATELETS: 172 10*3/uL (ref 150.0–400.0)
RBC: 3.91 Mil/uL — ABNORMAL LOW (ref 4.22–5.81)
RDW: 14.4 % (ref 11.5–15.5)
WBC: 5.8 10*3/uL (ref 4.0–10.5)

## 2015-09-08 LAB — LIPID PANEL
CHOLESTEROL: 170 mg/dL (ref 0–200)
HDL: 61.5 mg/dL (ref >39.00)
LDL Cholesterol: 96 mg/dL (ref 0–99)
NonHDL: 108.84
TRIGLYCERIDES: 63 mg/dL (ref 0.0–149.0)
Total CHOL/HDL Ratio: 3
VLDL: 12.6 mg/dL (ref 0.0–40.0)

## 2015-09-08 LAB — VITAMIN D 25 HYDROXY (VIT D DEFICIENCY, FRACTURES): VITD: 33.7 ng/mL (ref 30.00–100.00)

## 2015-09-08 LAB — MICROALBUMIN / CREATININE URINE RATIO
CREATININE, U: 149.3 mg/dL
MICROALB UR: 24.5 mg/dL — AB (ref 0.0–1.9)
Microalb Creat Ratio: 16.4 mg/g (ref 0.0–30.0)

## 2015-09-08 LAB — HEMOGLOBIN A1C: Hgb A1c MFr Bld: 6.9 % — ABNORMAL HIGH (ref 4.6–6.5)

## 2015-09-08 LAB — VITAMIN B12: Vitamin B-12: 248 pg/mL (ref 211–911)

## 2015-09-08 NOTE — Telephone Encounter (Signed)
Bill [name] received refill glipizide 10 mg taking 1 1/2 tab before breakfast; Bill wanted to verify dosage and instructions. On 06/29/15 Email glipizide 10 mg was taking 10 mg in AM and 5 mg at dinner.Please advise.

## 2015-09-08 NOTE — Progress Notes (Signed)
Pre visit review using our clinic review tool, if applicable. No additional management support is needed unless otherwise documented below in the visit note. 

## 2015-09-08 NOTE — Progress Notes (Signed)
Subjective:   Bill [name] is a 75 y.o. male who presents for Medicare Annual/Subsequent preventive examination.   Cardiac Risk Factors include: advanced age (>91men, >41 women);diabetes mellitus;hypertension;male gender;obesity (BMI >30kg/m2)     Objective:    Vitals: BP 118/68 mmHg  Pulse 72  Temp(Src) 98.6 F (37 C) (Oral)  Ht 6\' 1"  (1.854 m)  Wt 268 lb 8 oz (121.791 kg)  BMI 35.43 kg/m2  SpO2 94%  Body mass index is 35.43 kg/(m^2).  Tobacco History  Smoking status  . Never Smoker   Smokeless tobacco  . Never Used     Counseling given: No   Past Medical History  Diagnosis Date  . Hypertension   . Type 2 diabetes, controlled, with retinopathy ([place]) 1980s  . Exogenous obesity   . Urge incontinence   . Lyme disease 2012    ?titers negative  . History of chicken pox   . Aortic stenosis 12/2010    a. Echo 12/29/10: Mild LVH, EF 50-55%, normal wall motion, mild aortic stenosis, trivial AI, mean gradient 19 mmHg, mild LAE;  b. Echo 11/13:  mod LVH, vigorous LV EF 65-70%, mod AS, mean 23 mmHg, mild MR, mild BAE, mild RVE  c. echo 08/2013 with mod-severe AS  . Kidney stones remote  . GERD (gastroesophageal reflux disease)     occasional  . Atrial fibrillation ([place])   . Vitamin B12 deficiency     IF normal (2014)   Past Surgical History  Procedure Laterality Date  . Kidney stone removal    . Finger sx    . US echocardiography  12/2010    EF 50-55%, mild LVH, normal wall motion, high LV filling pressures, mild AS, LA mildly dilated  . Back surgery  20 yrs ago    removal of lower back cartilage due to damage. Patient does not remember date of surgery.  . Total hip arthroplasty  03/12/2012    RIGHT TOTAL HIP ARTHROPLASTY ANTERIOR APPROACH;  Surgeon: Mcarthur Rossetti, MD;  . Cataract extraction Bilateral 2015    [facility]  . Cardioversion N/A 02/25/2015    Procedure: CARDIOVERSION;  Surgeon: Skeet Latch, MD;  [facility] ENDOSCOPY;  Service:  Cardiovascular;  Laterality: N/A;   Family History  Problem Relation Age of Onset  . Diabetes Father   . Diabetes Mother   . Heart disease Father   . Heart disease Mother   . Cancer Brother     throat   History  Sexual Activity  . Sexual Activity: No    Outpatient Encounter Prescriptions as of 09/08/2015  Medication Sig  . apixaban (ELIQUIS) 5 MG TABS tablet Take 1 tablet (5 mg total) by mouth 2 (two) times daily.  . cholecalciferol (VITAMIN D) 1000 UNITS tablet Take 1,000 Units by mouth daily.  Marland Kitchen dofetilide (TIKOSYN) 250 MCG capsule Take 1 capsule (250 mcg total) by mouth 2 (two) times daily.  . furosemide (LASIX) 20 MG tablet Take 1 tablet (20 mg total) by mouth daily.  Marland Kitchen glipiZIDE (GLUCOTROL) 10 MG tablet Take 1.5 tablets (15 mg total) by mouth daily before breakfast.  . ibuprofen (ADVIL,MOTRIN) 400 MG tablet Take 1 tablet (400 mg total) by mouth 2 (two) times daily as needed.  . Magnesium Oxide 250 MG TABS Take 1 tablet by mouth daily.  . metFORMIN (GLUCOPHAGE) 500 MG tablet Take 500 mg by mouth 2 (two) times daily with a meal.   . Multiple Vitamin (MULTIVITAMIN WITH MINERALS) TABS tablet Take by mouth  daily. 1 packet of 5 vitamins daily  . multivitamin-lutein (OCUVITE-LUTEIN) CAPS capsule Take 3 capsules by mouth daily.  Marland Kitchen oxybutynin (DITROPAN) 5 MG tablet Take 1 tablet (5 mg total) by mouth 3 (three) times daily.  . vitamin B-12 (CYANOCOBALAMIN) 1000 MCG tablet Take 1,000 mcg by mouth daily.   No facility-administered encounter medications on file as of 09/08/2015.    Activities of Daily Living In your present state of health, do you have any difficulty performing the following activities: 09/08/2015 02/23/2015  Hearing? N N  Vision? N N  Difficulty concentrating or making decisions? N N  Walking or climbing stairs? N N  Dressing or bathing? N N  Doing errands, shopping? N N  Preparing Food and eating ? N -  Using the Toilet? N -  In the past six months, have you  accidently leaked urine? N -  Do you have problems with loss of bowel control? N -  Managing your Medications? N -  Managing your Finances? N -  Housekeeping or managing your Housekeeping? N -    Patient Care Team: [name], MD as PCP - General (Family Medicine)   Assessment:     Exercise Activities and Dietary recommendations Current Exercise Habits: The patient has a physically strenous job, but has no regular exercise apart from work. 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 Eat more fruits and vegetables     Starting 09/08/2015, I will attempt to eat at least 2 low glycemic fruits daily.       Fall Risk Fall Risk  09/08/2015 09/15/2014 09/03/2013 09/05/2012  Falls in the past year? No Yes No No  Number falls in past yr: - 1 - -  Injury with Fall? - No - -   Depression Screen PHQ 2/9 Scores 09/08/2015 09/15/2014 09/03/2013 09/05/2012  PHQ - 2 Score 0 0 0 0    Cognitive Testing MMSE - Mini Mental State Exam 09/08/2015  Orientation to time 5  Orientation to Place 5  Registration 3  Attention/ Calculation 0  Recall 3  Language- name 2 objects 0  Language- repeat 1  Language- follow 3 step command 3  Language- read & follow direction 0  Write a sentence 0  Copy design 0  Total score 20   PLEASE NOTE: A Mini-Cog screen was completed. Maximum score is 20. A value of 0 denotes this part of [place] MMSE was not completed.  Orientation to Time - Max 5 Orientation to Place - Max 5 Registration - Max 3 Recall - Max 3 Language Repeat - Max 1 Language Follow 3 Step Command - Max 3  Immunization History  Administered Date(s) Administered  . H1N1 06/03/2008  . Influenza Split 03/16/2011, 03/01/2012  . Influenza Whole 04/23/2007, 06/09/2009, 02/02/2010  . Influenza,inj,Quad PF,36+ Mos 02/26/2013, 02/18/2014, 02/19/2015  . Pneumococcal Conjugate-13 09/03/2013  . Pneumococcal Polysaccharide-23 04/17/2006    . Td 08/23/2011  . Zoster 10/08/2013   Screening Tests Health Maintenance  Topic Date Due  . INFLUENZA VACCINE  12/21/2015  . FOOT EXAM  02/19/2016  . HEMOGLOBIN A1C  03/09/2016  . OPHTHALMOLOGY EXAM  08/20/2016  . URINE MICROALBUMIN  09/07/2016  . TETANUS/TDAP  08/22/2021  . DTaP/Tdap/Td  Completed  . ZOSTAVAX  Completed  . PNA vac Low Risk Adult  Completed      Plan:     I have personally reviewed and addressed the Medicare Annual Wellness  questionnaire and have noted the following in the patient's chart:  A. Medical and social history B. Use of alcohol, tobacco or illicit drugs  C. Current medications and supplements D. Functional ability and status E.  Nutritional status F.  Physical activity G. Advance directives H. List of other physicians I.  Hospitalizations, surgeries, and ER visits in previous 12 months J.  [place] to include hearing, vision, cognitive, depression L. Referrals and appointments - none  In addition, I have reviewed and discussed with patient certain preventive protocols, quality metrics, and best practice recommendations. A written personalized care plan for preventive services as well as general preventive health recommendations were provided to patient.  See attached scanned questionnaire for additional information.   Signed,   Lindell Noe, MHA, BS, LPN Health Advisor [id number]

## 2015-09-08 NOTE — Progress Notes (Signed)
I reviewed health advisor's note, was available for consultation, and agree with documentation and plan.  

## 2015-09-08 NOTE — Patient Instructions (Signed)
Mr. Babel , Thank you for taking time to come for your Medicare Wellness Visit. I appreciate your ongoing commitment to your health goals. Please review the following plan we discussed and let me know if I can assist you in the future.   These are the goals we discussed: Goals    . Eat more fruits and vegetables     Starting 09/08/2015, I will attempt to eat at least 2 low glycemic fruits daily.        This is a list of the screening recommended for you and due dates:  Health Maintenance  Topic Date Due  . Flu Shot  12/21/2015  . Complete foot exam   02/19/2016  . Hemoglobin A1C  03/09/2016  . Eye exam for diabetics  08/20/2016  . Urine Protein Check  09/07/2016  . Tetanus Vaccine  08/22/2021  . DTaP/Tdap/Td vaccine  Completed  . Shingles Vaccine  Completed  . Pneumonia vaccines  Completed    Preventive Care for Adults  A healthy lifestyle and preventive care can promote health and wellness. Preventive health guidelines for adults include the following key practices.  . A routine yearly physical is a good way to check with your health care provider about your health and preventive screening. It is a chance to share any concerns and updates on your health and to receive a thorough exam.  . Visit your dentist for a routine exam and preventive care every 6 months. Brush your teeth twice a day and floss once a day. Good oral hygiene prevents tooth decay and gum disease.  . The frequency of eye exams is based on your age, health, family medical history, use  of contact lenses, and other factors. Follow your health care provider's ecommendations for frequency of eye exams.  . Eat a healthy diet. Foods like vegetables, fruits, whole grains, low-fat dairy products, and lean protein foods contain the nutrients you need without too many calories. Decrease your intake of foods high in solid fats, added sugars, and salt. Eat the right amount of calories for you. Get information about a  proper diet from your health care provider, if necessary.  . Regular physical exercise is one of the most important things you can do for your health. Most adults should get at least 150 minutes of moderate-intensity exercise (any activity that increases your heart rate and causes you to sweat) each week. In addition, most adults need muscle-strengthening exercises on 2 or more days a week.  Silver Sneakers may be a benefit available to you. To determine eligibility, you may visit the website: www.silversneakers.com or contact program at [phone] Mon-Fri between 8AM-8PM.   . Maintain a healthy weight. The body mass index (BMI) is a screening tool to identify possible weight problems. It provides an estimate of body fat based on height and weight. Your health care provider can find your BMI and can help you achieve or maintain a healthy weight.   For adults 20 years and older: ? A BMI below 18.5 is considered underweight. ? A BMI of 18.5 to 24.9 is normal. ? A BMI of 25 to 29.9 is considered overweight. ? A BMI of 30 and above is considered obese.   . Maintain normal blood lipids and cholesterol levels by exercising and minimizing your intake of saturated fat. Eat a balanced diet with plenty of fruit and vegetables. Blood tests for lipids and cholesterol should begin at age 17 and be repeated every 5 years. If your lipid or cholesterol  levels are high, you are over 50, or you are at high risk for heart disease, you may need your cholesterol levels checked more frequently. Ongoing high lipid and cholesterol levels should be treated with medicines if diet and exercise are not working.  . If you smoke, find out from your health care provider how to quit. If you do not use tobacco, please do not start.  . If you choose to drink alcohol, please do not consume more than 2 drinks per day. One drink is considered to be 12 ounces (355 mL) of beer, 5 ounces (148 mL) of wine, or 1.5 ounces (44 mL) of  liquor.  . If you are 12-34 years old, ask your health care provider if you should take aspirin to prevent strokes.  . Use sunscreen. Apply sunscreen liberally and repeatedly throughout the day. You should seek shade when your shadow is shorter than you. Protect yourself by wearing long sleeves, pants, a wide-brimmed hat, and sunglasses year round, whenever you are outdoors.  . Once a month, do a whole body skin exam, using a mirror to look at the skin on your back. Tell your health care provider of new moles, moles that have irregular borders, moles that are larger than a pencil eraser, or moles that have changed in shape or color.

## 2015-09-09 MED ORDER — GLIPIZIDE 10 MG PO TABS: ORAL_TABLET | ORAL | Status: DC

## 2015-09-09 NOTE — Telephone Encounter (Addendum)
Hope with [facility] request cb about glipizide.

## 2015-09-09 NOTE — Addendum Note (Signed)
Addended by: [name] on: 09/09/2015 07:38 AM   Modules accepted: Orders

## 2015-09-09 NOTE — Telephone Encounter (Signed)
Message left for [facility]. Advised correct SIG for Glipizide.

## 2015-09-09 NOTE — Telephone Encounter (Signed)
Sent in correct dose 10mg  with breakfast, 5mg  with dinner

## 2015-09-14 ENCOUNTER — Encounter: Payer: Self-pay | Admitting: Family Medicine

## 2015-09-14 ENCOUNTER — Ambulatory Visit (INDEPENDENT_AMBULATORY_CARE_PROVIDER_SITE_OTHER): Payer: PPO | Admitting: Family Medicine

## 2015-09-14 VITALS — BP 130/84 | HR 109 | Temp 97.7°F | Ht 72.0 in | Wt 270.8 lb

## 2015-09-14 DIAGNOSIS — Z7189 Other specified counseling: Secondary | ICD-10-CM | POA: Diagnosis not present

## 2015-09-14 DIAGNOSIS — Z0000 Encounter for general adult medical examination without abnormal findings: Secondary | ICD-10-CM | POA: Insufficient documentation

## 2015-09-14 DIAGNOSIS — E785 Hyperlipidemia, unspecified: Secondary | ICD-10-CM | POA: Diagnosis not present

## 2015-09-14 DIAGNOSIS — R251 Tremor, unspecified: Secondary | ICD-10-CM

## 2015-09-14 DIAGNOSIS — E1122 Type 2 diabetes mellitus with diabetic chronic kidney disease: Secondary | ICD-10-CM

## 2015-09-14 DIAGNOSIS — W57XXXA Bitten or stung by nonvenomous insect and other nonvenomous arthropods, initial encounter: Secondary | ICD-10-CM

## 2015-09-14 DIAGNOSIS — E538 Deficiency of other specified B group vitamins: Secondary | ICD-10-CM

## 2015-09-14 DIAGNOSIS — N183 Chronic kidney disease, stage 3 (moderate): Secondary | ICD-10-CM

## 2015-09-14 DIAGNOSIS — T148 Other injury of unspecified body region: Secondary | ICD-10-CM

## 2015-09-14 DIAGNOSIS — I480 Paroxysmal atrial fibrillation: Secondary | ICD-10-CM

## 2015-09-14 DIAGNOSIS — I10 Essential (primary) hypertension: Secondary | ICD-10-CM

## 2015-09-14 DIAGNOSIS — E11319 Type 2 diabetes mellitus with unspecified diabetic retinopathy without macular edema: Secondary | ICD-10-CM

## 2015-09-14 DIAGNOSIS — E1142 Type 2 diabetes mellitus with diabetic polyneuropathy: Secondary | ICD-10-CM

## 2015-09-14 DIAGNOSIS — I350 Nonrheumatic aortic (valve) stenosis: Secondary | ICD-10-CM

## 2015-09-14 DIAGNOSIS — E1121 Type 2 diabetes mellitus with diabetic nephropathy: Secondary | ICD-10-CM

## 2015-09-14 DIAGNOSIS — IMO0001 Reserved for inherently not codable concepts without codable children: Secondary | ICD-10-CM

## 2015-09-14 DIAGNOSIS — E559 Vitamin D deficiency, unspecified: Secondary | ICD-10-CM | POA: Diagnosis not present

## 2015-09-14 DIAGNOSIS — E875 Hyperkalemia: Secondary | ICD-10-CM

## 2015-09-14 NOTE — Assessment & Plan Note (Addendum)
Great control off meds.

## 2015-09-14 NOTE — Assessment & Plan Note (Signed)
Preventative protocols reviewed and updated unless pt declined. Discussed healthy diet and lifestyle.  

## 2015-09-14 NOTE — Assessment & Plan Note (Signed)
Chronic, stable. Reviewed A1c with patient. No med changes today.

## 2015-09-14 NOTE — Progress Notes (Signed)
Pre visit review using our clinic review tool, if applicable. No additional management support is needed unless otherwise documented below in the visit note. 

## 2015-09-14 NOTE — Assessment & Plan Note (Addendum)
Sounds regular on exam today - continue f/u with cards

## 2015-09-14 NOTE — Progress Notes (Signed)
BP 130/84 mmHg  Pulse 109  Temp(Src) 97.7 F (36.5 C)  Ht 6' (1.829 m)  Wt 270 lb 12.8 oz (122.834 kg)  BMI 36.72 kg/m2  SpO2 97%   CC: CPE  Subjective:    Patient ID: Bill [name], male    DOB: [birth year], 75 y.o.   MRN: [id number]  HPI: Bill [name] is a 75 y.o. male presenting on 09/14/2015 for Medicare Wellness   Saw Katha Cabal for medicare wellness visit last week.  Atrial fibrillation, chronic dCHF, mod-severe AS with HTN followed by cardiology. Sees Dr Lovena Le for EP. Pending f/u echo in 6 months. Had L vitectomy surgery for vitreous bleed earlier this year.   H/o chronic hypoerkalemia from ACEI - these were stopped but pt restarted when blood pressure started increasing. Overall stable readings since lasix was added for dCHF.   DM - complaint with metformin 500mg  twice daily and glipizide 10mg  with breakfast and 5mg  with dinner. Fasting 150s. prevnar 2015, pneumovax 2007. DM eye exam last month.   Regularly taking 400mg  ibuprofen 0-1 per day.  Regular with oxybutynin 5mg  TID. Some dry mouth noted.  Ongoing R hand resting tremor. Prior on propranolol but this was stopped 2/2 AS. Gabapentin not helpful. Denies memory trouble, denies stiffness/rigidity or anosmia. No fmhx tremor or parkinsonism.  Tick bite right popliteal area suffered 1 wk ago. No sxs from this.  Hearing screen passed today Vision - eye clinic regularly Denies depression/sadness/anhedonia. No falls in past year.  Preventative: Colonoscopy 2002, told normal, rpt 10 yrs. Has had yearly stool kits. Agrees to cologuard. Prostate screening - always normal. Denies nocturia, hesitancy or changes in stream. Interested in aging out. Flu shot yearly  Pneumonia shot 2007. prevnar 08/2013 Td 2013  zostavax - 09/2013 Advanced directives: has living will at home. Asked to bring [place] copy. Does not want prolonged life support "no extraordinary measures" but would accept temporary measures. Would want wife to be  HCPOA.  Seat belt use discussed Sunscreen use discussed. No changing moles on skin.  Caffeine: coffee 1 cup/day Lives with wife [name]), 1 cat Occupation: retired, worked for [employer]: BS Activity: no regular exercise, gardening Diet: some water, fruits/vegetables daily  Relevant past medical, surgical, family and social history reviewed and updated as indicated. Interim medical history since our last visit reviewed. Allergies and medications reviewed and updated. Current Outpatient Prescriptions on File Prior to Visit  Medication Sig  . apixaban (ELIQUIS) 5 MG TABS tablet Take 1 tablet (5 mg total) by mouth 2 (two) times daily.  . cholecalciferol (VITAMIN D) 1000 UNITS tablet Take 1,000 Units by mouth daily.  Marland Kitchen dofetilide (TIKOSYN) 250 MCG capsule Take 1 capsule (250 mcg total) by mouth 2 (two) times daily.  . furosemide (LASIX) 20 MG tablet Take 1 tablet (20 mg total) by mouth daily.  Marland Kitchen glipiZIDE (GLUCOTROL) 10 MG tablet Take one tablet daily with breakfast, half tablet daily with dinner  . ibuprofen (ADVIL,MOTRIN) 400 MG tablet Take 1 tablet (400 mg total) by mouth 2 (two) times daily as needed.  . Magnesium Oxide 250 MG TABS Take 1 tablet by mouth daily.  . metFORMIN (GLUCOPHAGE) 500 MG tablet Take 500 mg by mouth 2 (two) times daily with a meal.   . Multiple Vitamin (MULTIVITAMIN WITH MINERALS) TABS tablet Take by mouth daily. 1 packet of 5 vitamins daily  . multivitamin-lutein (OCUVITE-LUTEIN) CAPS capsule Take 3 capsules by mouth daily.  Marland Kitchen oxybutynin (DITROPAN) 5 MG tablet Take 1 tablet (5  mg total) by mouth 3 (three) times daily.  . vitamin B-12 (CYANOCOBALAMIN) 1000 MCG tablet Take 1,000 mcg by mouth daily.   No current facility-administered medications on file prior to visit.    Review of Systems  Constitutional: Negative for fever, chills, activity change, appetite change, fatigue and unexpected weight change.  HENT: Negative for hearing loss.   Eyes: Negative for visual  disturbance.  Respiratory: Negative for cough, chest tightness, shortness of breath and wheezing.   Cardiovascular: Negative for chest pain, palpitations and leg swelling.  Gastrointestinal: Positive for constipation. Negative for nausea, vomiting, abdominal pain, diarrhea, blood in stool and abdominal distention.  Genitourinary: Negative for hematuria and difficulty urinating.  Musculoskeletal: Negative for myalgias, arthralgias and neck pain.  Skin: Negative for rash.  Neurological: Negative for dizziness, seizures, syncope and headaches.  Hematological: Negative for adenopathy. Bruises/bleeds easily.  Psychiatric/Behavioral: Negative for dysphoric mood. The patient is not nervous/anxious.    Per HPI unless specifically indicated in ROS section     Objective:    BP 130/84 mmHg  Pulse 109  Temp(Src) 97.7 F (36.5 C)  Ht 6' (1.829 m)  Wt 270 lb 12.8 oz (122.834 kg)  BMI 36.72 kg/m2  SpO2 97%  Wt Readings from Last 3 Encounters:  09/14/15 270 lb 12.8 oz (122.834 kg)  09/08/15 268 lb 8 oz (121.791 kg)  06/01/15 270 lb 3.2 oz (122.562 kg)    Physical Exam  Constitutional: He is oriented to person, place, and time. He appears well-developed and well-nourished. No distress.  obese  HENT:  Head: Normocephalic and atraumatic.  Right Ear: Hearing, tympanic membrane, external ear and ear canal normal.  Left Ear: Hearing, tympanic membrane, external ear and ear canal normal.  Nose: Nose normal.  Mouth/Throat: Uvula is midline, oropharynx is clear and moist and mucous membranes are normal. No oropharyngeal exudate, posterior oropharyngeal edema or posterior oropharyngeal erythema.  Eyes: Conjunctivae and EOM are normal. Pupils are equal, round, and reactive to light. No scleral icterus.  Neck: Normal range of motion. Neck supple. Carotid bruit is not present. No thyromegaly present.  Cardiovascular: Normal rate, regular rhythm, normal heart sounds and intact distal pulses.   No murmur  heard. Pulses:      Radial pulses are 2+ on the right side, and 2+ on the left side.  Pulmonary/Chest: Effort normal and breath sounds normal. No respiratory distress. He has no wheezes. He has no rales.  Abdominal: Soft. Bowel sounds are normal. He exhibits no distension and no mass. There is no tenderness. There is no rebound and no guarding.  Musculoskeletal: Normal range of motion. He exhibits no edema.  Lymphadenopathy:    He has no cervical adenopathy.  Neurological: He is alert and oriented to person, place, and time.  CN grossly intact, station and gait intact  Skin: Skin is warm and dry. No rash noted.  R popliteal region with tick bite with scab, no evident retained tick part, no surrounding erythema  Psychiatric: He has a normal mood and affect. His behavior is normal. Judgment and thought content normal.  Nursing note and vitals reviewed.  Results for orders placed or performed in visit on 09/14/15  HM DIABETES EYE EXAM  Result Value Ref Range   HM Diabetic Eye Exam No Retinopathy No Retinopathy   Lab Results  Component Value Date   HGBA1C 6.9* 09/08/2015       Assessment & Plan:   Problem List Items Addressed This Visit    Type 2 diabetes, controlled,  with retinopathy (HCC)    Chronic, stable. Reviewed A1c with patient. No med changes today.       Vitamin D deficiency    Chronic, stable. Continue vit D       HLD (hyperlipidemia)    Great control off meds.      Obesity, Class II, BMI 35-39.9, with comorbidity (HCC)    Encouraged healthy diet and lifestyle changes to affect sustainable weight loss.      Essential hypertension    Chronic, stable. Continue lasix.       Moderate to severe aortic stenosis    Appreciate cards care of patient. Denies chest pain, dyspnea, dizziness      Atrial fibrillation ([place])    Sounds regular on exam today - continue f/u with cards      Vitamin B12 deficiency    b12 level dropping. Prior on IM supplementation. Not  regular with oral B12 - rec start daily. If remaining low, consider return to IM supplementation. Consider check IF next labs.      CKD stage 3 due to type 2 diabetes mellitus (HCC)    Chronic, stable. Reviewed readings with patient.      Advanced care planning/counseling discussion    Advanced directives: has living will at home. Asked to bring [place] copy. Does not want prolonged life support "no extraordinary measures" but would accept temporary measures. Would want wife to be HCPOA.       Tremor    Evident right handed tremor presumed essential tremor. No other signs of parkinsonism.      Hyperkalemia    K stable off ACEI on lasix      Diabetic peripheral neuropathy associated with type 2 diabetes mellitus ([place])   Diabetic nephropathy associated with type 2 diabetes mellitus ([place])   Health maintenance examination - Primary    Preventative protocols reviewed and updated unless pt declined. Discussed healthy diet and lifestyle.       Tick bite    No signs of tick borne illness - reviewed. Discussed monitoring over next 3-4 days for new symptoms and notify us if this happens.          Follow up plan: Return in about 6 months (around 03/15/2016), or as needed, for follow up visit.  [name], MD

## 2015-09-14 NOTE — Patient Instructions (Addendum)
We will sign you up for cologuard. Bring [place] copy of your advanced directives with health care power of attorney form.  Advanced directive packet provided today.  You are doing well today - continue watching sugars.  Return in 6 months for diabetes follow up.  Health Maintenance, Male A healthy lifestyle and preventative care can promote health and wellness.  Maintain regular health, dental, and eye exams.  Eat a healthy diet. Foods like vegetables, fruits, whole grains, low-fat dairy products, and lean protein foods contain the nutrients you need and are low in calories. Decrease your intake of foods high in solid fats, added sugars, and salt. Get information about a proper diet from your health care provider, if necessary.  Regular physical exercise is one of the most important things you can do for your health. Most adults should get at least 150 minutes of moderate-intensity exercise (any activity that increases your heart rate and causes you to sweat) each week. In addition, most adults need muscle-strengthening exercises on 2 or more days a week.   Maintain a healthy weight. The body mass index (BMI) is a screening tool to identify possible weight problems. It provides an estimate of body fat based on height and weight. Your health care provider can find your BMI and can help you achieve or maintain a healthy weight. For males 20 years and older:  A BMI below 18.5 is considered underweight.  A BMI of 18.5 to 24.9 is normal.  A BMI of 25 to 29.9 is considered overweight.  A BMI of 30 and above is considered obese.  Maintain normal blood lipids and cholesterol by exercising and minimizing your intake of saturated fat. Eat a balanced diet with plenty of fruits and vegetables. Blood tests for lipids and cholesterol should begin at age 32 and be repeated every 5 years. If your lipid or cholesterol levels are high, you are over age 42, or you are at high risk for heart disease, you may need  your cholesterol levels checked more frequently.Ongoing high lipid and cholesterol levels should be treated with medicines if diet and exercise are not working.  If you smoke, find out from your health care provider how to quit. If you do not use tobacco, do not start.  Lung cancer screening is recommended for adults aged 19-80 years who are at high risk for developing lung cancer because of a history of smoking. A yearly low-dose CT scan of the lungs is recommended for people who have at least a 30-pack-year history of smoking and are current smokers or have quit within the past 15 years. A pack year of smoking is smoking an average of 1 pack of cigarettes a day for 1 year (for example, a 30-pack-year history of smoking could mean smoking 1 pack a day for 30 years or 2 packs a day for 15 years). Yearly screening should continue until the smoker has stopped smoking for at least 15 years. Yearly screening should be stopped for people who develop a health problem that would prevent them from having lung cancer treatment.  If you choose to drink alcohol, do not have more than 2 drinks per day. One drink is considered to be 12 oz (360 mL) of beer, 5 oz (150 mL) of wine, or 1.5 oz (45 mL) of liquor.  Avoid the use of street drugs. Do not share needles with anyone. Ask for help if you need support or instructions about stopping the use of drugs.  High blood pressure causes  heart disease and increases the risk of stroke. High blood pressure is more likely to develop in:  People who have blood pressure in the end of the normal range (100-139/85-89 mm Hg).  People who are overweight or obese.  People who are African American.  If you are 86-92 years of age, have your blood pressure checked every 3-5 years. If you are 26 years of age or older, have your blood pressure checked every year. You should have your blood pressure measured twice--once when you are at a hospital or clinic, and once when you are not  at a hospital or clinic. Record the average of the two measurements. To check your blood pressure when you are not at a hospital or clinic, you can use:  An automated blood pressure machine at a pharmacy.  A home blood pressure monitor.  If you are 47-45 years old, ask your health care provider if you should take aspirin to prevent heart disease.  Diabetes screening involves taking a blood sample to check your fasting blood sugar level. This should be done once every 3 years after age 64 if you are at a normal weight and without risk factors for diabetes. Testing should be considered at a younger age or be carried out more frequently if you are overweight and have at least 1 risk factor for diabetes.  Colorectal cancer can be detected and often prevented. Most routine colorectal cancer screening begins at the age of 63 and continues through age 25. However, your health care provider may recommend screening at an earlier age if you have risk factors for colon cancer. On a yearly basis, your health care provider may provide home test kits to check for hidden blood in the stool. A small camera at the end of a tube may be used to directly examine the colon (sigmoidoscopy or colonoscopy) to detect the earliest forms of colorectal cancer. Talk to your health care provider about this at age 39 when routine screening begins. A direct exam of the colon should be repeated every 5-10 years through age 44, unless early forms of precancerous polyps or small growths are found.  People who are at an increased risk for hepatitis B should be screened for this virus. You are considered at high risk for hepatitis B if:  You were born in a country where hepatitis B occurs often. Talk with your health care provider about which countries are considered high risk.  Your parents were born in a high-risk country and you have not received a shot to protect against hepatitis B (hepatitis B vaccine).  You have HIV or  AIDS.  You use needles to inject street drugs.  You live with, or have sex with, someone who has hepatitis B.  You are a man who has sex with other men (MSM).  You get hemodialysis treatment.  You take certain medicines for conditions like cancer, organ transplantation, and autoimmune conditions.  Hepatitis C blood testing is recommended for all people born from [birth year] through 1967 and any individual with known risk factors for hepatitis C.  Healthy men should no longer receive prostate-specific antigen (PSA) blood tests as part of routine cancer screening. Talk to your health care provider about prostate cancer screening.  Testicular cancer screening is not recommended for adolescents or adult males who have no symptoms. Screening includes self-exam, a health care provider exam, and other screening tests. Consult with your health care provider about any symptoms you have or any concerns you have about  testicular cancer.  Practice safe sex. Use condoms and avoid high-risk sexual practices to reduce the spread of sexually transmitted infections (STIs).  You should be screened for STIs, including gonorrhea and chlamydia if:  You are sexually active and are younger than 24 years.  You are older than 24 years, and your health care provider tells you that you are at risk for this type of infection.  Your sexual activity has changed since you were last screened, and you are at an increased risk for chlamydia or gonorrhea. Ask your health care provider if you are at risk.  If you are at risk of being infected with HIV, it is recommended that you take a prescription medicine daily to prevent HIV infection. This is called pre-exposure prophylaxis (PrEP). You are considered at risk if:  You are a man who has sex with other men (MSM).  You are a heterosexual man who is sexually active with multiple partners.  You take drugs by injection.  You are sexually active with a partner who has  HIV.  Talk with your health care provider about whether you are at high risk of being infected with HIV. If you choose to begin PrEP, you should first be tested for HIV. You should then be tested every 3 months for as long as you are taking PrEP.  Use sunscreen. Apply sunscreen liberally and repeatedly throughout the day. You should seek shade when your shadow is shorter than you. Protect yourself by wearing long sleeves, pants, a wide-brimmed hat, and sunglasses year round whenever you are outdoors.  Tell your health care provider of new moles or changes in moles, especially if there is a change in shape or color. Also, tell your health care provider if a mole is larger than the size of a pencil eraser.  A one-time screening for abdominal aortic aneurysm (AAA) and surgical repair of large AAAs by ultrasound is recommended for men aged 41-75 years who are current or former smokers.  Stay current with your vaccines (immunizations).   This information is not intended to replace advice given to you by your health care provider. Make sure you discuss any questions you have with your health care provider.   Document Released: 11/04/2007 Document Revised: 05/29/2014 Document Reviewed: 10/03/2010 Elsevier Interactive Patient Education [employer].

## 2015-09-14 NOTE — Assessment & Plan Note (Signed)
b12 level dropping. Prior on IM supplementation. Not regular with oral B12 - rec start daily. If remaining low, consider return to IM supplementation. Consider check IF next labs.

## 2015-09-14 NOTE — Assessment & Plan Note (Signed)
Chronic, stable. Continue lasix.

## 2015-09-14 NOTE — Assessment & Plan Note (Signed)
Advanced directives: has living will at home. Asked to bring [place] copy. Does not want prolonged life support "no extraordinary measures" but would accept temporary measures. Would want wife to be HCPOA.

## 2015-09-14 NOTE — Assessment & Plan Note (Signed)
Evident right handed tremor presumed essential tremor. No other signs of parkinsonism.

## 2015-09-14 NOTE — Assessment & Plan Note (Signed)
Encouraged healthy diet and lifestyle changes to affect sustainable weight loss.  

## 2015-09-14 NOTE — Assessment & Plan Note (Signed)
Chronic, stable. Continue vit D.  

## 2015-09-14 NOTE — Assessment & Plan Note (Signed)
K stable off ACEI on lasix

## 2015-09-14 NOTE — Assessment & Plan Note (Signed)
Appreciate cards care of patient. Denies chest pain, dyspnea, dizziness

## 2015-09-14 NOTE — Assessment & Plan Note (Signed)
Chronic, stable. Reviewed readings with patient.

## 2015-09-14 NOTE — Assessment & Plan Note (Addendum)
No signs of tick borne illness - reviewed. Discussed monitoring over next 3-4 days for new symptoms and notify us if this happens.

## 2015-09-21 DIAGNOSIS — Z1211 Encounter for screening for malignant neoplasm of colon: Secondary | ICD-10-CM | POA: Diagnosis not present

## 2015-09-21 DIAGNOSIS — Z1212 Encounter for screening for malignant neoplasm of rectum: Secondary | ICD-10-CM | POA: Diagnosis not present

## 2015-09-22 LAB — COLOGUARD: Cologuard: NEGATIVE

## 2015-09-27 ENCOUNTER — Telehealth: Payer: Self-pay | Admitting: Internal Medicine

## 2015-09-27 NOTE — Telephone Encounter (Signed)
Per pt call needs a follow up appt. From ECHO

## 2015-09-27 NOTE — Telephone Encounter (Signed)
Melissa,  Can you please arrange follow up with Dr. Lovena Le sometime after 10/21/15 when he has his echo done.   Thanks!

## 2015-10-20 ENCOUNTER — Encounter: Payer: Self-pay | Admitting: *Deleted

## 2015-10-21 ENCOUNTER — Ambulatory Visit (HOSPITAL_COMMUNITY): Payer: PPO | Attending: Cardiology

## 2015-10-21 ENCOUNTER — Other Ambulatory Visit: Payer: Self-pay

## 2015-10-21 ENCOUNTER — Encounter: Payer: Self-pay | Admitting: Internal Medicine

## 2015-10-21 DIAGNOSIS — E1122 Type 2 diabetes mellitus with diabetic chronic kidney disease: Secondary | ICD-10-CM | POA: Diagnosis not present

## 2015-10-21 DIAGNOSIS — Z8249 Family history of ischemic heart disease and other diseases of the circulatory system: Secondary | ICD-10-CM | POA: Insufficient documentation

## 2015-10-21 DIAGNOSIS — E669 Obesity, unspecified: Secondary | ICD-10-CM | POA: Diagnosis not present

## 2015-10-21 DIAGNOSIS — I130 Hypertensive heart and chronic kidney disease with heart failure and stage 1 through stage 4 chronic kidney disease, or unspecified chronic kidney disease: Secondary | ICD-10-CM | POA: Diagnosis not present

## 2015-10-21 DIAGNOSIS — I509 Heart failure, unspecified: Secondary | ICD-10-CM | POA: Diagnosis not present

## 2015-10-21 DIAGNOSIS — I340 Nonrheumatic mitral (valve) insufficiency: Secondary | ICD-10-CM | POA: Insufficient documentation

## 2015-10-21 DIAGNOSIS — Z6836 Body mass index (BMI) 36.0-36.9, adult: Secondary | ICD-10-CM | POA: Diagnosis not present

## 2015-10-21 DIAGNOSIS — I071 Rheumatic tricuspid insufficiency: Secondary | ICD-10-CM | POA: Diagnosis not present

## 2015-10-21 DIAGNOSIS — I359 Nonrheumatic aortic valve disorder, unspecified: Secondary | ICD-10-CM | POA: Diagnosis not present

## 2015-10-21 DIAGNOSIS — N189 Chronic kidney disease, unspecified: Secondary | ICD-10-CM | POA: Insufficient documentation

## 2015-10-21 DIAGNOSIS — E785 Hyperlipidemia, unspecified: Secondary | ICD-10-CM | POA: Insufficient documentation

## 2015-10-21 DIAGNOSIS — I350 Nonrheumatic aortic (valve) stenosis: Secondary | ICD-10-CM | POA: Insufficient documentation

## 2015-10-26 ENCOUNTER — Ambulatory Visit (INDEPENDENT_AMBULATORY_CARE_PROVIDER_SITE_OTHER): Payer: PPO | Admitting: Internal Medicine

## 2015-10-26 ENCOUNTER — Encounter: Payer: Self-pay | Admitting: Internal Medicine

## 2015-10-26 VITALS — BP 152/80 | HR 67 | Ht 74.0 in | Wt 266.0 lb

## 2015-10-26 DIAGNOSIS — I480 Paroxysmal atrial fibrillation: Secondary | ICD-10-CM | POA: Diagnosis not present

## 2015-10-26 NOTE — Patient Instructions (Signed)
Medication Instructions:  Your physician recommends that you continue on your current medications as directed. Please refer to the Current Medication list given to you today.    Labwork: None ordered   Testing/Procedures: None ordered   Follow-Up:  Your physician recommends that you schedule a follow-up appointment with Chanetta Marshall, NP to discuss [facility] will call you   Your physician wants you to follow-up in: 6 months with Dr Knox Saliva will receive a reminder letter in the mail two months in advance. If you don't receive a letter, please call our office to schedule the follow-up appointment.      Any Other Special Instructions Will Be Listed Below (If Applicable).     If you need a refill on your cardiac medications before your next appointment, please call your pharmacy.

## 2015-10-26 NOTE — Progress Notes (Signed)
HPI [name] returns today for EP followup.He is a 75 yo man with multiple medical problems including AS (mod-severe) and MR, who has atrial fib, who has multiple stroke risk factors and has also had bleeding in his eye. He has class 2 CHF symptoms. He has not fallen. He has otherwise tolerated Eliquis. He and I are concerned about long term anti-coagulation. He also has LA enlargement and RV enlargement on echo.   No Known Allergies   Current Outpatient Prescriptions  Medication Sig Dispense Refill  . apixaban (ELIQUIS) 5 MG TABS tablet Take 1 tablet (5 mg total) by mouth 2 (two) times daily. 60 tablet 3  . cholecalciferol (VITAMIN D) 1000 UNITS tablet Take 1,000 Units by mouth daily.    Marland Kitchen dofetilide (TIKOSYN) 250 MCG capsule Take 1 capsule (250 mcg total) by mouth 2 (two) times daily. 60 capsule 11  . furosemide (LASIX) 20 MG tablet Take 1 tablet (20 mg total) by mouth daily.    Marland Kitchen glipiZIDE (GLUCOTROL) 10 MG tablet Take one tablet by mouth daily with breakfast, half tablet daily with dinner    . ibuprofen (ADVIL,MOTRIN) 400 MG tablet Take 400 mg by mouth 2 (two) times daily as needed (pain).    . Magnesium Oxide 250 MG TABS Take 1 tablet by mouth daily.    . metFORMIN (GLUCOPHAGE) 500 MG tablet Take 500 mg by mouth 2 (two) times daily with a meal.     . Multiple Vitamin (MULTIVITAMIN WITH MINERALS) TABS tablet Take by mouth daily. 1 packet of 5 vitamins daily    . multivitamin-lutein (OCUVITE-LUTEIN) CAPS capsule Take 3 capsules by mouth daily.    Marland Kitchen oxybutynin (DITROPAN) 5 MG tablet Take 1 tablet (5 mg total) by mouth 3 (three) times daily. 270 tablet 3  . vitamin B-12 (CYANOCOBALAMIN) 1000 MCG tablet Take 1,000 mcg by mouth daily.     No current facility-administered medications for this visit.     Past Medical History  Diagnosis Date  . Hypertension   . Type 2 diabetes, controlled, with retinopathy ([place]) 1980s  . Exogenous obesity   . Urge incontinence   . Lyme disease  2012    ?titers negative  . History of chicken pox   . Aortic stenosis 12/2010    a. Echo 12/29/10: Mild LVH, EF 50-55%, normal wall motion, mild aortic stenosis, trivial AI, mean gradient 19 mmHg, mild LAE;  b. Echo 11/13:  mod LVH, vigorous LV EF 65-70%, mod AS, mean 23 mmHg, mild MR, mild BAE, mild RVE  c. echo 08/2013 with mod-severe AS  . Kidney stones remote  . GERD (gastroesophageal reflux disease)     occasional  . Atrial fibrillation ([place])   . Vitamin B12 deficiency     IF normal (2014)    ROS:   All systems reviewed and negative except as noted in the HPI.   Past Surgical History  Procedure Laterality Date  . Kidney stone removal    . Finger sx    . US echocardiography  12/2010    EF 50-55%, mild LVH, normal wall motion, high LV filling pressures, mild AS, LA mildly dilated  . Back surgery  20 yrs ago    removal of lower back cartilage due to damage. Patient does not remember date of surgery.  . Total hip arthroplasty  03/12/2012    RIGHT TOTAL HIP ARTHROPLASTY ANTERIOR APPROACH;  Surgeon: Mcarthur Rossetti, MD;  . Cataract extraction Bilateral 2015    Tommy Rainwater  and Bullekowski  . Cardioversion N/A 02/25/2015    Procedure: CARDIOVERSION;  Surgeon: Skeet Latch, MD;  Location[facility] ENDOSCOPY;  Service: Cardiovascular;  Laterality: N/A;     Family History  Problem Relation Age of Onset  . Diabetes Father   . Diabetes Mother   . Heart disease Father   . Heart disease Mother   . Cancer Brother     throat     Social History   Social History  . Marital Status: Married    Spouse Name: N/A  . Number of Children: N/A  . Years of Education: N/A   Occupational History  . Not on file.   Social History Main Topics  . Smoking status: Never Smoker   . Smokeless tobacco: Never Used  . Alcohol Use: Yes     Comment: Regular (bourbon and coke 3-4/day)  . Drug Use: No  . Sexual Activity: No   Other Topics Concern  . Not on file   Social History Narrative    Caffeine: coffee 1 cup/day   Lives with wife [name]), 1 cat   Occupation: retired, worked for [employer]   Edu: BS   Activity: no regular exercise   Diet: some water, fruits/vegetables daily      Advanced directives: has living will at home. Does not want prolonged life support "no extraordinary measures" but would accept temporary measures. Would want wife to be HCPOA.      BP 152/80 mmHg  Pulse 67  Ht 6\' 2"  (1.88 m)  Wt 266 lb (120.657 kg)  BMI 34.14 kg/m2  Physical Exam:  Well appearing 75 yo man, NAD HEENT: Unremarkable Neck:  7 JVD, no thyromegally Back:  No CVA tenderness Lungs:  Scattered rales with no wheezes HEART:  Regular rate rhythm, 2/6 AS murmur, no rubs, no clicks Abd:  soft, positive bowel sounds, no organomegally, no rebound, no guarding Ext:  2 plus pulses, trace peripheral edema, no cyanosis, no clubbing Skin:  No rashes no nodules Neuro:  CN II through XII intact, motor grossly intact  EKG - atrial fib with a controlled VR   Assess/Plan: 1. Atrial fib - he will be referred for a Watchman consult. His rate is controlled. Hopefully he is still maintaining NSR much of the time. 2. Aortic stenosis - he has mod-severe disease. He will need a repeat echo in 12 months.  3. Chronic diastolic heart failure - his symptoms are class 2. I have asked him to increase his lasix to 40 mg daily. 4. Bleeding - I do not have these records but he tells me he has had bleeding behind his eye. Will continue Eliquis for now but hope to stop it soon.

## 2015-10-29 ENCOUNTER — Encounter: Payer: Self-pay | Admitting: Nurse Practitioner

## 2015-10-29 ENCOUNTER — Encounter: Payer: Self-pay | Admitting: *Deleted

## 2015-10-29 ENCOUNTER — Ambulatory Visit (INDEPENDENT_AMBULATORY_CARE_PROVIDER_SITE_OTHER): Payer: PPO | Admitting: Cardiovascular Disease

## 2015-10-29 VITALS — BP 142/78 | HR 72 | Ht 74.0 in | Wt 266.6 lb

## 2015-10-29 DIAGNOSIS — I481 Persistent atrial fibrillation: Secondary | ICD-10-CM | POA: Diagnosis not present

## 2015-10-29 DIAGNOSIS — I4819 Other persistent atrial fibrillation: Secondary | ICD-10-CM

## 2015-10-29 DIAGNOSIS — I10 Essential (primary) hypertension: Secondary | ICD-10-CM

## 2015-10-29 DIAGNOSIS — I350 Nonrheumatic aortic (valve) stenosis: Secondary | ICD-10-CM | POA: Diagnosis not present

## 2015-10-29 NOTE — Progress Notes (Signed)
Watchman Consult Note   Date:  10/29/2015   ID:  Bill [name], DOB 01-May-1941, MRN [id number]  PCP:  [name], MD  Primary Electrophysiologist: Lovena Le Referring Physician: Lovena Le   CC: to discuss Watchman implant    History of Present Illness: Bill [name] is a 75 y.o. male who presents today for evaluation of left atrial appendage occluder.  He has persistent atrial fibrillation as well as moderate AS, hypertension, diabetes, and chronic kidney disease.  The patient has been evaluated by their referring physician and is felt to be a poor candidate for long term [place] due to prior retinal bleeding requiring surgery to evacaute.  He therefore presents today for Watchman evaluation.   Today, he denies symptoms of palpitations, chest pain, shortness of breath, orthopnea, PND, lower extremity edema, claudication, dizziness, presyncope, syncope, bleeding, or neurologic sequela. The patient is tolerating medications without difficulties and is otherwise without complaint today.   Echo 10/2015 demonstrated EF 55-60%, moderate LVH, moderate AS, LA 51  Past Medical History  Diagnosis Date  . Hypertension   . Type 2 diabetes, controlled, with retinopathy ([place]) 1980s  . Exogenous obesity   . Urge incontinence   . Lyme disease 2012    ?titers negative  . History of chicken pox   . Aortic stenosis 12/2010    a. Echo 12/29/10: Mild LVH, EF 50-55%, normal wall motion, mild aortic stenosis, trivial AI, mean gradient 19 mmHg, mild LAE;  b. Echo 11/13:  mod LVH, vigorous LV EF 65-70%, mod AS, mean 23 mmHg, mild MR, mild BAE, mild RVE  c. echo 08/2013 with mod-severe AS  . Kidney stones remote  . GERD (gastroesophageal reflux disease)     occasional  . Atrial fibrillation ([place])   . Vitamin B12 deficiency     IF normal (2014)   Past Surgical History  Procedure Laterality Date  . Kidney stone removal    . Finger sx    . US echocardiography  12/2010    EF 50-55%, mild LVH, normal wall  motion, high LV filling pressures, mild AS, LA mildly dilated  . Back surgery  20 yrs ago    removal of lower back cartilage due to damage. Patient does not remember date of surgery.  . Total hip arthroplasty  03/12/2012    RIGHT TOTAL HIP ARTHROPLASTY ANTERIOR APPROACH;  Surgeon: Mcarthur Rossetti, MD;  . Cataract extraction Bilateral 2015    [facility]  . Cardioversion N/A 02/25/2015    Procedure: CARDIOVERSION;  Surgeon: Skeet Latch, MD;  Location: [facility];  Service: Cardiovascular;  Laterality: N/A;     Current Outpatient Prescriptions  Medication Sig Dispense Refill  . apixaban (ELIQUIS) 5 MG TABS tablet Take 1 tablet (5 mg total) by mouth 2 (two) times daily. 60 tablet 3  . cholecalciferol (VITAMIN D) 1000 UNITS tablet Take 1,000 Units by mouth daily.    Marland Kitchen dofetilide (TIKOSYN) 250 MCG capsule Take 1 capsule (250 mcg total) by mouth 2 (two) times daily. 60 capsule 11  . furosemide (LASIX) 20 MG tablet Take 1 tablet (20 mg total) by mouth daily.    Marland Kitchen glipiZIDE (GLUCOTROL) 10 MG tablet Take one tablet by mouth daily with breakfast, half tablet daily with dinner    . ibuprofen (ADVIL,MOTRIN) 400 MG tablet Take 400 mg by mouth 2 (two) times daily as needed (pain).    . Magnesium Oxide 250 MG TABS Take 1 tablet by mouth daily.    . metFORMIN (GLUCOPHAGE) 500  MG tablet Take 500 mg by mouth 2 (two) times daily with a meal.     . Multiple Vitamin (MULTIVITAMIN WITH MINERALS) TABS tablet Take by mouth daily. 1 packet of 5 vitamins daily    . multivitamin-lutein (OCUVITE-LUTEIN) CAPS capsule Take 3 capsules by mouth daily.    Marland Kitchen oxybutynin (DITROPAN) 5 MG tablet Take 1 tablet (5 mg total) by mouth 3 (three) times daily. 270 tablet 3  . vitamin B-12 (CYANOCOBALAMIN) 1000 MCG tablet Take 1,000 mcg by mouth daily.     No current facility-administered medications for this visit.    Allergies:   Review of patient's allergies indicates no known allergies.   Social  History:  The patient  reports that he has never smoked. He has never used smokeless tobacco. He reports that he drinks alcohol. He reports that he does not use illicit drugs.   Family History:  The patient's family history includes Cancer in his brother; Diabetes in his father and mother; Heart disease in his father and mother.    ROS:  Please see the history of present illness.   All other systems are reviewed and negative.    PHYSICAL EXAM: VS:  BP 142/78 mmHg  Pulse 72  Ht 6\' 2"  (1.88 m)  Wt 266 lb 9.6 oz (120.929 kg)  BMI 34.21 kg/m2  SpO2 97% , BMI Body mass index is 34.21 kg/(m^2). GEN: Obese, well developed, in no acute distress HEENT: normal Neck: no JVD, carotid bruits, or masses Cardiac: RRR; 2/6 systolic murmur Respiratory:  clear to auscultation bilaterally, normal work of breathing GI: soft, nontender, nondistended, + BS MS: no deformity or atrophy Skin: warm and dry  Neuro:  Strength and sensation are intact, +resting right arm tremor Psych: euthymic mood, full affect  EKG:  EKG is not ordered today.  Recent Labs: 02/19/2015: ALT 18 04/23/2015: Magnesium 1.9 09/08/2015: BUN 24*; Creatinine, Ser 1.60*; Hemoglobin 12.7*; Platelets 172.0; Potassium 5.1; Sodium 138    Lipid Panel     Component Value Date/Time   CHOL 170 09/08/2015 0821   TRIG 63.0 09/08/2015 0821   TRIG 71 04/17/2006 1259   HDL 61.50 09/08/2015 0821   CHOLHDL 3 09/08/2015 0821   CHOLHDL 4.5 CALC 04/17/2006 1259   VLDL 12.6 09/08/2015 0821   LDLCALC 96 09/08/2015 0821   LDLDIRECT 124.4 12/31/2012 0739   LDLDIRECT 173.7 04/17/2006 1259     Wt Readings from Last 3 Encounters:  10/29/15 266 lb 9.6 oz (120.929 kg)  10/26/15 266 lb (120.657 kg)  09/14/15 270 lb 12.8 oz (122.834 kg)      Other studies Reviewed: Additional studies/ records that were reviewed today include: Dr Tanna Furry office notes, echo    ASSESSMENT AND PLAN:  1.  Persistent atrial fibrillation I have seen Bill [name] is a 75 y.o. male in the office today with Dr [name] who has been referred by Dr Lovena Le for a Watchman left atrial appendage closure device.  He has a history of persistent atrial fibrillation.  This patients CHA2DS2-VASc Score and unadjusted Ischemic Stroke Rate (% per year) is equal to 4.8 % stroke rate/year from a score of 4 which necessitates long term oral anticoagulation to prevent stroke. HasBled score is 5.  Modified Rankin Score is 1. Unfortunately,he is not felt to be a long term Warfarin candidate secondary to prior retinal bleeding requiring surgical evacuation.  The patients chart has been reviewed and I along with their referring cardiologist feel that they would be a candidate for  short term oral anticoagulation.  Procedural risks for the Watchman implant have been reviewed with the patient including a 1% risk of stroke, 2% risk of perforation, 0.1% risk of device embolization.  Given the patient's poor candidacy for long-term oral anticoagulation, ability to tolerate short term oral anticoagulation, we have recommended the watchman left atrial appendage closure system.  TEE will be scheduled to review LAA anatomy and aortic stenosis. Will plan follow up by phone after TEE  2.  HTN Stable No change required today  3.  Moderate AS Will more closely evaluate with pre-watchman TEE   Follow-up:  With EP NP by phone following TEE  Current medicines are reviewed at length with the patient today.   The patient does not have concerns regarding his medicines.  The following changes were made today:  none  Labs/ tests ordered today include: none  No orders of the defined types were placed in this encounter.     Signed, Chanetta Marshall, NP  10/29/2015 3:24 PM     [place] [address] [phone] (office) [phone] (fax)  Patient seen, examined. Available data reviewed. Agree with findings, assessment, and plan as outlined  by Chanetta Marshall. Exam reveals clear lung fields, JVP normal, CV: RRR with 3/6 harsh systolic murmur at the LLSB, A2 diminished but present. Abdomen obese. Extremities without edema. No rash. Considering his bleeding history on anticoagulant Rx and CHADS-Vasc 4, Watchman is a reasonable treatment options. Reviewed details of the implant procedure, including potential risks, alternatives, and benefits. He understands and would like to undergo pre-procedure TEE to see if his LAA has anatomic features amenable to Watchman implant. He will further consider options and we will see him back in follow-up after his TEE. Risks/indication of TEE reveiwed with the patient as well. All Questions answered.  [name], M.D. 10/31/2015 10:15 PM

## 2015-10-29 NOTE — Patient Instructions (Addendum)
Medication Instructions:   Your physician recommends that you continue on your current medications as directed. Please refer to the Current Medication list given to you today.    If you need a refill on your cardiac medications before your next appointment, please call your pharmacy.  Labwork:   NONE ORDER TODAY    Testing/Procedures:  SEE LETTER FOR TEE    Follow-Up:  WILL  BE CONTACTED AFTER RESULTS   Any Other Special Instructions Will Be Listed Below (If Applicable).

## 2015-11-03 ENCOUNTER — Other Ambulatory Visit: Payer: Self-pay | Admitting: Family Medicine

## 2015-11-05 ENCOUNTER — Encounter (HOSPITAL_COMMUNITY)
Admission: RE | Disposition: A | Discharge status: Home / Self Care | Payer: Self-pay | Source: Ambulatory Visit | Attending: Internal Medicine

## 2015-11-05 ENCOUNTER — Encounter (HOSPITAL_COMMUNITY): Payer: Self-pay

## 2015-11-05 ENCOUNTER — Ambulatory Visit (HOSPITAL_BASED_OUTPATIENT_CLINIC_OR_DEPARTMENT_OTHER): Payer: PPO

## 2015-11-05 ENCOUNTER — Ambulatory Visit (HOSPITAL_COMMUNITY)
Admission: RE | Admit: 2015-11-05 | Discharge: 2015-11-05 | Disposition: A | Discharge status: Home / Self Care | Payer: PPO | Source: Ambulatory Visit | Attending: Internal Medicine | Admitting: Internal Medicine

## 2015-11-05 DIAGNOSIS — I4891 Unspecified atrial fibrillation: Secondary | ICD-10-CM

## 2015-11-05 DIAGNOSIS — I350 Nonrheumatic aortic (valve) stenosis: Secondary | ICD-10-CM | POA: Diagnosis not present

## 2015-11-05 HISTORY — PX: TEE WITHOUT CARDIOVERSION: SHX5443

## 2015-11-05 LAB — ECHO TEE
AOPV: 0.24 m/s
AOVTI: 98.8 cm
AV Area VTI index: 0.4 cm2/m2
AV Area VTI: 1.27 cm2
AV area mean vel ind: 0.44 cm2/m2
AV pk vel: 403 cm/s
AVAREAMEANV: 1.09 cm2
AVCELMEANRAT: 0.21
AVG: 40 mmHg
AVPG: 65 mmHg
CHL CUP AV PEAK INDEX: 0.52
CHL CUP AV VEL: 0.99
CHL CUP DOP CALC LVOT VTI: 18.4 cm
DOP CAL AO MEAN VELOCITY: 291 cm/s
LDCA: 5.31 cm2
LVOT SV: 98 mL
LVOT peak VTI: 0.19 cm
LVOT peak vel: 96.6 cm/s
LVOTD: 26 mm
Valve area index: 0.4
Valve area: 0.99 cm2

## 2015-11-05 LAB — GLUCOSE, CAPILLARY: GLUCOSE-CAPILLARY: 155 mg/dL — AB (ref 65–99)

## 2015-11-05 SURGERY — ECHOCARDIOGRAM, TRANSESOPHAGEAL
Anesthesia: Moderate Sedation

## 2015-11-05 MED ORDER — MIDAZOLAM HCL 10 MG/2ML IJ SOLN
INTRAMUSCULAR | Status: DC | PRN
  Administered 2015-11-05: 2 mg via INTRAVENOUS
  Administered 2015-11-05: 1 mg via INTRAVENOUS

## 2015-11-05 MED ORDER — BUTAMBEN-TETRACAINE-BENZOCAINE 2-2-14 % EX AERO
INHALATION_SPRAY | CUTANEOUS | Status: DC | PRN
  Administered 2015-11-05: 2 via TOPICAL

## 2015-11-05 MED ORDER — FENTANYL CITRATE (PF) 100 MCG/2ML IJ SOLN
INTRAMUSCULAR | Status: AC
  Filled 2015-11-05: qty 4

## 2015-11-05 MED ORDER — MIDAZOLAM HCL 5 MG/ML IJ SOLN
INTRAMUSCULAR | Status: AC
  Filled 2015-11-05: qty 3

## 2015-11-05 MED ORDER — FENTANYL CITRATE (PF) 100 MCG/2ML IJ SOLN
INTRAMUSCULAR | Status: DC | PRN
  Administered 2015-11-05 (×2): 25 ug via INTRAVENOUS

## 2015-11-05 MED ORDER — DIPHENHYDRAMINE HCL 50 MG/ML IJ SOLN
INTRAMUSCULAR | Status: AC
  Filled 2015-11-05: qty 1

## 2015-11-05 MED ORDER — LIDOCAINE VISCOUS 2 % MT SOLN
OROMUCOSAL | Status: DC | PRN
  Administered 2015-11-05: 7 mL via OROMUCOSAL

## 2015-11-05 MED ORDER — LIDOCAINE VISCOUS 2 % MT SOLN
OROMUCOSAL | Status: AC
  Filled 2015-11-05: qty 15

## 2015-11-05 MED ORDER — SODIUM CHLORIDE 0.9 % IV SOLN
INTRAVENOUS | Status: DC
  Administered 2015-11-05: 500 mL via INTRAVENOUS

## 2015-11-05 NOTE — Discharge Instructions (Signed)
Aortic Stenosis Aortic stenosis is a narrowing of the aortic valve. The aortic valve is a gate-like structure that is located between the lower left chamber of the heart (left ventricle) and the blood vessel that leads away from the heart (aorta). When the aortic valve is narrowed, it does not open all the way. This makes it hard for the heart to pump blood into the aorta and causes the heart to work harder. The extra work can weaken the heart over time and lead to heart failure. CAUSES  Causes of aortic stenosis include:  Calcium deposits on the aortic valve that have made the valve stiff. This condition generally affects those over the age of 82. It is the most common cause of aortic stenosis.  A birth defect.  Rheumatic fever. This is a problem that may occur after a strep throat infection that was not treated adequately. Rheumatic fever can cause permanent damage to heart valves. SIGNS AND SYMPTOMS  People with aortic stenosis usually have no symptoms until the condition becomes severe. It may take 10-20 years for mild or moderate aortic stenosis to become severe. Symptoms may include:   Shortness of breath, especially with physical activity.   Feeling weak and tired (fatigued) or getting tired easily.  Chest discomfort (angina). This may occur with minimal activity if the aortic stenosis is severe.  An irregular or faster-than-normal heartbeat.  Dizziness or fainting that happens with exertion or after taking certain heart medicines (such as nitroglycerin). DIAGNOSIS  Aortic stenosis is usually diagnosed with a physical exam and with a type of imaging test called echocardiography. During echocardiography, sound waves are used to evaluate how blood flows through the heart. If your health care provider suspects aortic stenosis but the test does not clearly show it, a procedure called cardiac catheterization may be done to diagnose the condition. Tests may also be done to evaluate heart  function. They may include:  Electrocardiography. During this test, the electrical impulses of the heart are recorded while you are lying down and sticky patches are placed on your chest, arms, and legs.  Stress tests. There is more than one type of stress test. If a stress test is needed, ask your health care provider about which type is best for you.  Blood tests. TREATMENT  Treatment depends on how severe the aortic stenosis is, your symptoms, and the problems it is causing.   Observation. If the aortic stenosis is mild, no treatment may be needed. However, you will need to have the condition checked regularly to make sure it is not getting worse or causing serious problems.  Surgery. Surgery to repair or replace the aortic valve is the most common treatment for aortic stenosis. Several types of surgeries are available. The most common are open-heart surgery and transcutaneous aortic valve replacement (TAVR). TAVR does not require that the chest be opened. It is usually performed on elderly patients and those who are not able to have open-heart surgery.  Medicines. Medicines may be given to keep symptoms from getting worse. Medicines cannot reverse aortic stenosis. HOME CARE INSTRUCTIONS   You may need to avoid certain types of physical activity. If your aortic stenosis is mild, you may need to avoid only strenuous activity. The more severe your aortic stenosis, the more activities you will need to avoid. Talk with your health care provider about the types of activity you should avoid.  Take medicines only as directed by your health care provider.  If you are a woman with  aortic valve stenosis and want to get pregnant, talk to your health care provider before you become pregnant.  If you are a woman with aortic valve stenosis and are pregnant, keep all follow-up visits with all recommended health care providers.  Keep all follow-up visits for tests, exams, and treatments as directed by  your health care provider. SEEK IMMEDIATE MEDICAL CARE IF:  You develop chest pain or tightness.   You develop shortness of breath or difficulty breathing.   You develop light-headedness or faint.   It feels like your heartbeat is irregular or faster than normal.  You have a fever.   This information is not intended to replace advice given to you by your health care provider. Make sure you discuss any questions you have with your health care provider.   Document Released: 02/04/2003 Document Revised: 01/27/2015 Document Reviewed: 05/02/2012 Elsevier Interactive Patient Education 2016 [employer]. Transesophageal Echocardiogram Transesophageal echocardiography (TEE) is a special type of test that produces images of the heart by using sound waves (echocardiogram). This type of echocardiography can obtain better images of the heart than standard echocardiography. TEE is done by passing a flexible tube down the esophagus. The heart is located in front of the esophagus. Because the heart and esophagus are close to one another, your health care provider can take very clear, detailed pictures of the heart via ultrasound waves. TEE may be done:  If your health care provider needs more information based on standard echocardiography findings.  If you had a stroke. This might have happened because a clot formed in your heart. TEE can visualize different areas of the heart and check for clots.  To check valve anatomy and function.  To check for infection on the inside of your heart (endocarditis).  To evaluate the dividing wall (septum) of the heart and presence of a hole that did not close after birth (patent foramen ovale or atrial septal defect).  To help diagnose a tear in the wall of the aorta (aortic dissection).  During cardiac valve surgery. This allows the surgeon to assess the valve repair before closing the chest.  During a variety of other cardiac procedures to guide  positioning of catheters.  Sometimes before a cardioversion, which is a shock to convert heart rhythm back to normal. LET [facility] CARE PROVIDER KNOW ABOUT:   Any allergies you have.  All medicines you are taking, including vitamins, herbs, eye drops, creams, and over-the-counter medicines.  Previous problems you or members of your family have had with the use of anesthetics.  Any blood disorders you have.  Previous surgeries you have had.  Medical conditions you have.  Swallowing difficulties.  An esophageal obstruction. RISKS AND COMPLICATIONS  Generally, TEE is a safe procedure. However, as with any procedure, complications can occur. Possible complications include an esophageal tear (rupture). BEFORE THE PROCEDURE   Do not eat or drink for 6 hours before the procedure or as directed by your health care provider.  Arrange for someone to drive you home after the procedure. Do not drive yourself home. During the procedure, you will be given medicines that can continue to make you feel drowsy and can impair your reflexes.  An IV access tube will be started in the arm. PROCEDURE   A medicine to help you relax (sedative) will be given through the IV access tube.  A medicine may be sprayed or gargled to numb the back of the throat.  Your blood pressure, heart rate, and breathing (vital  signs) will be monitored during the procedure.  The TEE probe is a long, flexible tube. The tip of the probe is placed into the back of the mouth, and you will be asked to swallow. This helps to pass the tip of the probe into the esophagus. Once the tip of the probe is in the correct area, your health care provider can take pictures of the heart.  TEE is usually not a painful procedure. You may feel the probe press against the back of the throat. The probe does not enter the trachea and does not affect your breathing. AFTER THE PROCEDURE   You will be in bed, resting, until you have fully  returned to consciousness.  When you first awaken, your throat may feel slightly sore and will probably still feel numb. This will improve slowly over time.  You will not be allowed to eat or drink until it is clear that the numbness has improved.  Once you have been able to drink, urinate, and sit on the edge of the bed without feeling sick to your stomach (nausea) or dizzy, you may be cleared to go home.  You should have a friend or family member with you for the next 24 hours after your procedure.   This information is not intended to replace advice given to you by your health care provider. Make sure you discuss any questions you have with your health care provider.   Document Released: 07/29/2002 Document Revised: 05/13/2013 Document Reviewed: 11/07/2012 Elsevier Interactive Patient Education [employer].

## 2015-11-05 NOTE — H&P (Signed)
     INTERVAL PROCEDURE H&P  History and Physical Interval Note:  11/05/2015 8:34 AM  Bill [name] has presented today for their planned procedure. The various methods of treatment have been discussed with the patient and family. After consideration of risks, benefits and other options for treatment, the patient has consented to the procedure.  The patients' outpatient history has been reviewed, patient examined, and no change in status from most recent office note within the past 30 days. I have reviewed the patients' chart and labs and will proceed as planned. Questions were answered to the patient's satisfaction.   Pixie Casino, MD, [facility] Attending Cardiologist [place] Randalyn Ahmed 11/05/2015, 8:34 AM

## 2015-11-05 NOTE — Progress Notes (Signed)
Echocardiogram Echocardiogram Transesophageal has been performed.  Tresa Res 11/05/2015, 10:02 AM

## 2015-11-05 NOTE — CV Procedure (Signed)
Marland Kitchen   TRANSESOPHAGEAL ECHOCARDIOGRAM (TEE) NOTE  - WATCHMAN ATRIAL APPENDAGE OCCLUDER DEVICE EVALUATION    INDICATIONS: atrial fibrillation and aortic stenosis  PROCEDURE:   Informed consent was obtained prior to the procedure. The risks, benefits and alternatives for the procedure were discussed and the patient comprehended these risks.  Risks include, but are not limited to, cough, sore throat, vomiting, nausea, somnolence, esophageal and stomach trauma or perforation, bleeding, low blood pressure, aspiration, pneumonia, infection, trauma to the teeth and death.    After a procedural time-out, the patient was given 3 mg versed and 50 mcg fentanyl for moderate sedation.  The oropharynx was anesthetized 10 cc of topical 1% viscous lidocaine and 2 cetacaine sprays.  The transesophageal probe was inserted in the esophagus and stomach without difficulty and multiple views were obtained.  The patient was kept under observation until the patient left the procedure room.  The patient left the procedure room in stable condition. The patient's heart rate, blood pressure, and oxygen saturation are monitored continuously during the procedure. The period of conscious sedation is 28 minutes, of which I was present face-to-face 100% of this time.  Agitated microbubble saline contrast was administered.  COMPLICATIONS:    There were no immediate complications.  Findings:  1. LEFT VENTRICLE: The left ventricular wall thickness is moderately increased.  The left ventricular cavity is normal in size. Wall motion is hyperdynamic.  LVEF is 60-65%.  2. RIGHT VENTRICLE:  The right ventricle is normal in structure and function without any thrombus or masses.    3. LEFT ATRIUM:  The left atrium is dilated in size without any thrombus or masses.  There is not spontaneous echo contrast ("smoke") in the left atrium consistent with a low flow state.  4. LEFT ATRIAL APPENDAGE:  The left atrial appendage is free of  any thrombus or masses. The appendage has single lobe with a small side lobe and a windsock morphology.Pulse doppler indicates high flow in the appendage. The LUPV is oriented parallel to the left atrial appendage. Pertinent measurements in the appendage are as follows:   0 degrees 45 degrees 90 degrees 135 degrees  LAA Ostium (mm) 32 24 21 20   LAA Length (mm) 35 31 39 28      *LAA size should be between >17 mm and <31 mm, length generally greater than ostial length. Measure 2 cm from the coumadin ridge to ostium and across from left coronary artery.  .      5. ATRIAL SEPTUM:  The atrial septum appears intact and is free of thrombus and/or masses.  There is no evidence for interatrial shunting by color doppler and saline microbubble.  6. RIGHT ATRIUM:  The right atrium is normal in size and function without any thrombus or masses.  7. MITRAL VALVE:  The mitral valve is normal in structure and function with Mild regurgitation.  There were no vegetations or stenosis.  8. AORTIC VALVE:  The aortic valve is calcified with restricted mobility. There is severe aortic stenosis - mean gradient of 43 mmHg - based on an LVOT diameter of 2.6, the calculated AVA is 0.93 cm2. There is  no regurgitation.  There were no vegetations.  9. TRICUSPID VALVE:  The tricuspid valve is normal in structure and function with trivial regurgitation.  There were no vegetations or stenosis  10.  PULMONIC VALVE:  The pulmonic valve is poorly visualized due to shadowing from the aortic annulus.   11. AORTIC ARCH, ASCENDING AND DESCENDING AORTA:  There was grade 1 Ron Parker et. Al, 1992) atherosclerosis of the ascending aorta, aortic arch, or proximal descending aorta.  12. PULMONARY VEINS: Anomalous pulmonary venous return was not noted.  13. PERICARDIUM: The pericardium appeared normal and non-thickened.  There is no pericardial effusion.  IMPRESSION:   1. Severe aortic stenosis. 2. LAA suitable for watchman  closure  RECOMMENDATIONS:   (available Watchman sizes: 21, 24, 27, 30, 33 mm)    1. Despite suitable measurements for LAA closure, we were able to measure a mean gradient of 43 mmHg across the aortic valve which is heavily calcified and is severely stenosed. While this may be suitable for Watchman, given the severity of his aortic stenosis- evaluation for aortic valve replacement +/- MAZE and LAA surgical closure is recommended.  Time Spent Directly with the Patient:  60 minutes   Pixie Casino, MD, [facility] Attending Cardiologist [facility]  11/05/2015, 10:07 AM

## 2015-11-08 ENCOUNTER — Encounter (HOSPITAL_COMMUNITY): Payer: Self-pay | Admitting: Internal Medicine

## 2015-11-08 ENCOUNTER — Telehealth: Payer: Self-pay | Admitting: Nurse Practitioner

## 2015-11-08 NOTE — Telephone Encounter (Signed)
Left message for patient to call regarding TEE results.  AS is severe by TEE. Discussed with Dr [name], he should be seen to follow up and discuss treatment options.  Dr [name] is willing to see him to discuss results.  Please make appt for patient when patient calls back (may need to talk with Lauren to get approval to use TAVR spot)  Routing to triage as I am out of the office the rest of the week.   Chanetta Marshall, NP 11/08/2015 5:09 PM

## 2015-11-08 NOTE — Telephone Encounter (Signed)
I discussed this pt with Dr [name] and he recommends a 30 minute office visit.

## 2015-11-09 NOTE — Telephone Encounter (Signed)
I spoke with the pt in regards to TEE results.  Appointment arranged on 11/12/15 at 2:00 with Dr [name].

## 2015-11-12 ENCOUNTER — Ambulatory Visit (INDEPENDENT_AMBULATORY_CARE_PROVIDER_SITE_OTHER): Payer: PPO | Admitting: Cardiovascular Disease

## 2015-11-12 ENCOUNTER — Encounter: Payer: Self-pay | Admitting: Cardiovascular Disease

## 2015-11-12 VITALS — BP 110/58 | HR 92 | Ht 74.0 in | Wt 265.0 lb

## 2015-11-12 DIAGNOSIS — I350 Nonrheumatic aortic (valve) stenosis: Secondary | ICD-10-CM | POA: Diagnosis not present

## 2015-11-12 NOTE — Progress Notes (Signed)
Cardiology Office Note Date:  11/12/2015   ID:  Bill [name], DOB [birth year], MRN [id number]  PCP:  [name], MD  Cardiologist:  [name], MD    Chief Complaint  Patient presents with  . Aortic Stenosis    has sob with exertion. denies any cp,lee,or claudication  . Atrial Fibrillation    paroxysmal     History of Present Illness: Bill [name] is a 75 y.o. male who presents for Follow-up evaluation. The patient has been followed by Dr. Lovena [name] for atrial fibrillation. He is treated with dofetilide. I initially saw him on June 9 in consultation for consideration of Watchman implant. He has a CHADS-Vasc score of 4 with hx of retinal bleeding not currently on anticoagulation because of his bleeding history. The patient had been followed for aortic stenosis which was in the moderate range by Doppler criteria. We decided to proceed with TEE both to evaluate his potential for Watchman implantation and also to better evaluate the severity of his aortic stenosis. He returns today for further discussion.   The patient's TEE study demonstrated findings consistent with severe aortic stenosis. LV function is normal and there is no other significant valvular pathologic identified. The patient is here with his wife today. He complains of exertional dyspnea that is been present for about 5 years. There has been slow progression of symptoms but no major change. He denies chest pain or pressure, leg swelling, orthopnea, PND, or syncope. He does have occasional dizziness but no other exertional symptoms. The patient is short of breath walking to his mailbox and back. He states that he can walk a city block on flat ground without problems, but if there is even a small incline he is short of breath. He denies cough, fever, chills, or resting shortness of breath.  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 He is originally from [place], but has spent most of his adult life in  [place]. His grown children live in [place]. He remains fairly active and still does woodwork and yard work.  Past Medical History  Diagnosis Date  . Hypertension   . Type 2 diabetes, controlled, with retinopathy ([place]) 1980s  . Exogenous obesity   . Urge incontinence   . Lyme disease 2012    ?titers negative  . History of chicken pox   . Aortic stenosis 12/2010    a. Echo 12/29/10: Mild LVH, EF 50-55%, normal wall motion, mild aortic stenosis, trivial AI, mean gradient 19 mmHg, mild LAE;  b. Echo 11/13:  mod LVH, vigorous LV EF 65-70%, mod AS, mean 23 mmHg, mild MR, mild BAE, mild RVE  c. echo 08/2013 with mod-severe AS  . Kidney stones remote  . GERD (gastroesophageal reflux disease)     occasional  . Atrial fibrillation ([place])   . Vitamin B12 deficiency     IF normal (2014)    Past Surgical History  Procedure Laterality Date  . Kidney stone removal    . Finger sx    . US echocardiography  12/2010    EF 50-55%, mild LVH, normal wall motion, high LV filling pressures, mild AS, LA mildly dilated  . Back surgery  20 yrs ago    removal of lower back cartilage due to damage. Patient does not remember date of surgery.  . Total hip arthroplasty  03/12/2012    RIGHT TOTAL HIP ARTHROPLASTY ANTERIOR APPROACH;  Surgeon: Mcarthur Rossetti, MD;  . Cataract extraction Bilateral 2015  [facility]  . Cardioversion N/A 02/25/2015    Procedure: CARDIOVERSION;  Surgeon: Skeet Latch, MD;  Location: [facility];  Service: Cardiovascular;  Laterality: N/A;  . Tee without cardioversion N/A 11/05/2015    Procedure: TRANSESOPHAGEAL ECHOCARDIOGRAM (TEE);  Surgeon: Bill Casino, MD;  Location: [facility] ENDOSCOPY;  Service: Cardiovascular;  Laterality: N/A;    Current Outpatient Prescriptions  Medication Sig Dispense Refill  . apixaban (ELIQUIS) 5 MG TABS tablet Take 1 tablet (5 mg total) by mouth 2 (two) times daily. 60 tablet 3  . cholecalciferol (VITAMIN D) 1000 UNITS tablet  Take 1,000 Units by mouth daily.    Marland Kitchen dofetilide (TIKOSYN) 250 MCG capsule Take 1 capsule (250 mcg total) by mouth 2 (two) times daily. 60 capsule 11  . furosemide (LASIX) 20 MG tablet Take 1 tablet (20 mg total) by mouth daily.    Marland Kitchen glipiZIDE (GLUCOTROL) 10 MG tablet Take 5-10 mg by mouth 2 (two) times daily before a meal. Take 10 mg by mouth daily with breakfast, 5 mg daily with dinner    . glucose blood (ONE TOUCH ULTRA TEST) test strip Use to check sugar once daily and as needed. Dx:E11.319 **One Touch Ultra** 100 each 2  . ibuprofen (ADVIL,MOTRIN) 400 MG tablet Take 400 mg by mouth 2 (two) times daily as needed (pain).    . Magnesium Oxide 250 MG TABS Take 1 tablet by mouth daily.    . metFORMIN (GLUCOPHAGE) 500 MG tablet Take 500 mg by mouth 2 (two) times daily with a meal.     . Multiple Vitamin (MULTIVITAMIN WITH MINERALS) TABS tablet Take by mouth daily. 1 packet of 5 vitamins daily    . multivitamin-lutein (OCUVITE-LUTEIN) CAPS capsule Take 3 capsules by mouth daily.    Marland Kitchen oxybutynin (DITROPAN) 5 MG tablet Take 1 tablet (5 mg total) by mouth 3 (three) times daily. 270 tablet 3  . vitamin B-12 (CYANOCOBALAMIN) 1000 MCG tablet Take 1,000 mcg by mouth daily.     No current facility-administered medications for this visit.    Allergies:   Review of patient's allergies indicates no known allergies.   Social History:  The patient  reports that he has never smoked. He has never used smokeless tobacco. He reports that he drinks alcohol. He reports that he does not use illicit drugs.   Family History:  The patient's  family history includes Cancer in his brother; Diabetes in his father and mother; Heart disease in his father and mother.    ROS:  Please see the history of present illness.  All other systems are reviewed and negative.    PHYSICAL EXAM: VS:  BP 110/58 mmHg  Pulse 92  Ht 6\' 2"  (1.88 m)  Wt 265 lb (120.203 kg)  BMI 34.01 kg/m2 , BMI Body mass index is 34.01 kg/(m^2). GEN:  Well nourished, well developed, pleasant obese male in no acute distress HEENT: normal Neck: no JVD, no masses. No carotid bruits Cardiac: RRR with 3/6 harsh crescendo decresendo murmur at the RUSB              Respiratory:  clear to auscultation bilaterally, normal work of breathing GI: soft, nontender, nondistended, + BS MS: no deformity or atrophy Ext: no pretibial edema, pedal pulses 2+= bilaterally Skin: warm and dry, no rash Neuro:  Strength and sensation are intact Psych: euthymic mood, full affect  EKG:  EKG is not ordered today.  Recent Labs: 02/19/2015: ALT 18 04/23/2015: Magnesium 1.9 09/08/2015: BUN 24*; Creatinine, Ser 1.60*;  Hemoglobin 12.7*; Platelets 172.0; Potassium 5.1; Sodium 138   Lipid Panel     Component Value Date/Time   CHOL 170 09/08/2015 0821   TRIG 63.0 09/08/2015 0821   TRIG 71 04/17/2006 1259   HDL 61.50 09/08/2015 0821   CHOLHDL 3 09/08/2015 0821   CHOLHDL 4.5 CALC 04/17/2006 1259   VLDL 12.6 09/08/2015 0821   LDLCALC 96 09/08/2015 0821   LDLDIRECT 124.4 12/31/2012 0739   LDLDIRECT 173.7 04/17/2006 1259      Wt Readings from Last 3 Encounters:  11/12/15 265 lb (120.203 kg)  10/29/15 266 lb 9.6 oz (120.929 kg)  10/26/15 266 lb (120.657 kg)     Cardiac Studies Reviewed: 2D Echo: Study Conclusions  - Left ventricle: The cavity size was normal. There was moderate  concentric hypertrophy. Systolic function was normal. The  estimated ejection fraction was in the range of 55% to 60%. Wall  motion was normal; there were no regional wall motion  abnormalities. - Aortic valve: Severe diffuse thickening and calcification. Valve  mobility was restricted. There was moderate stenosis. Mean  gradient (S): 25 mm Hg. Valve area (VTI): 1.18 cm^2. Valve area  (Vmax): 1.08 cm^2. Valve area (Vmean): 1.16 cm^2. - Mitral valve: Calcified annulus. Mild diffuse calcification.  There was mild regurgitation. - Right ventricle: The cavity size was  severely dilated. Wall  thickness was normal. - Right atrium: The atrium was moderately dilated. - Tricuspid valve: There was trivial regurgitation.  Impressions:  - Normal LVF, moderate LVF, moderate to severe AS. Compared to  prior echo, AS as progressed.  TEE: Study Conclusions  - Left ventricle: There was mild concentric hypertrophy. Systolic  function was normal. The estimated ejection fraction was in the  range of 60% to 65%. Wall motion was normal; there were no  regional wall motion abnormalities. - Aortic valve: Heavily calcified with restricted leaflet motion.  There is severe aortic stenosis - AVA of 0.93 cm2 - mean gradient  of 43 mmHg and peak gradient of 71 mmHg. Valve area (VTI): 0.99  cm^2. Valve area (Vmax): 1.27 cm^2. Valve area (Vmean): 1.09  cm^2. - Aorta: Mild atheromatous disease. - Mitral valve: Mildly thickened leaflets . There was mild  regurgitation. - Left atrium: The atrium was dilated. No evidence of thrombus in  the atrial cavity or appendage. The appendage is windsock shaped. - Pulmonary veins: No anomaly. - Right atrium: No evidence of thrombus in the atrial cavity or  appendage. - Atrial septum: No defect or patent foramen ovale was identified.  Impressions:  - Severe aortic stenosis - AVA 0.93 cm2 with mean gradient of 43  mmHg. Maximal appendage measurement of 3.15 x 3.47 cm - windsock  appearance. While this may be suitable for Watchman, given the  severity of his aortic stenosis- evaluation for aortic valve  replacement +/- MAZE and LAA surgical closure is recommended.   ASSESSMENT AND PLAN: 75 year old gentleman with stage D, severe symptomatic aortic stenosis. Comorbid conditions include paroxysmal atrial fibrillation, obesity, and diabetes.  As outlined above, the patient was initially seen because of concern over his stroke risk in the context of paroxysmal atrial fibrillation and CHADS-Vasc score of 4 with hx  of bleeding (retinal hemorrhage). Clinical exam and echo findings are consistent with severe aortic stenosis and he understands the importance of addressing this.   I have personally reviewed his TEE images and agree he likely has severe aortic stenosis based on restriction and thickening of his aortic valve leaflets as well as interpretation  of his doppler data with a mean transaortic gradient of 43 mmHg. I have reviewed the natural history of aortic stenosis with the patient and his wife today. We have discussed the limitations of medical therapy and the poor prognosis associated with symptomatic aortic stenosis. We have reviewed potential treatment options, including palliative medical therapy, conventional surgical aortic valve replacement, and transcatheter aortic valve replacement. We discussed treatment options in the context of this patient's specific comorbid medical conditions.   I think there are advantages to definitive treatment with aortic valve replacement, surgical Maze, and LAA clipping. We discussed the importance of proceeding with cardiac catheterization if he decides to move forward with surgical evaluation. He is at risk for coronary artery disease considering his obesity, advanced age, and diabetes. He is not sure whether he is ready to move forward with further treatment at this time. I recommended that he undergo formal cardiac surgical consultation with Dr. Roxy Manns even before having cardiac catheterization so that he can further understand his options.  If he is not considered to be a candidate for cardiac surgery, it would be reasonable to consider TAVR and Watchman implantation, but I think cardiac surgery would probably be his best option.   Current medicines are reviewed with the patient today.  The patient does not have concerns regarding medicines.  Labs/ tests ordered today include:  No orders of the defined types were placed in this encounter.   Disposition:   Referral to  Dr Roxy Manns with [facility]  Signed, [name], MD  11/12/2015 2:06 PM    [facility] [address], [place], [place]  [address] Phone: [phone]; Fax: [phone]

## 2015-11-12 NOTE — Patient Instructions (Signed)
Medication Instructions:  Your physician recommends that you continue on your current medications as directed. Please refer to the Current Medication list given to you today.  Labwork: No new orders.   Testing/Procedures: No new orders.   Follow-Up: You have been referred to Dr Roxy Manns with [facility] for further evaluation of Aortic Valve    Any Other Special Instructions Will Be Listed Below (If Applicable).     If you need a refill on your cardiac medications before your next appointment, please call your pharmacy.

## 2015-12-07 ENCOUNTER — Encounter: Payer: PPO | Admitting: Thoracic Surgery (Cardiothoracic Vascular Surgery)

## 2015-12-27 ENCOUNTER — Other Ambulatory Visit: Payer: Self-pay | Admitting: *Deleted

## 2015-12-27 ENCOUNTER — Encounter: Payer: Self-pay | Admitting: Thoracic Surgery (Cardiothoracic Vascular Surgery)

## 2015-12-27 ENCOUNTER — Institutional Professional Consult (permissible substitution) (INDEPENDENT_AMBULATORY_CARE_PROVIDER_SITE_OTHER): Payer: PPO | Admitting: Thoracic Surgery (Cardiothoracic Vascular Surgery)

## 2015-12-27 VITALS — BP 140/78 | HR 69 | Resp 18 | Ht 74.0 in | Wt 254.0 lb

## 2015-12-27 DIAGNOSIS — I480 Paroxysmal atrial fibrillation: Secondary | ICD-10-CM | POA: Diagnosis not present

## 2015-12-27 DIAGNOSIS — I482 Chronic atrial fibrillation, unspecified: Secondary | ICD-10-CM

## 2015-12-27 DIAGNOSIS — I350 Nonrheumatic aortic (valve) stenosis: Secondary | ICD-10-CM

## 2015-12-27 NOTE — Patient Instructions (Addendum)
Schedule CT scan of chest  Schedule heart catheterization as soon as practical with Dr [name]  Continue all previous medications without any changes at this time

## 2015-12-27 NOTE — Progress Notes (Signed)
[address]       [name] [address]             [name] REPORT  Referring Provider is [name], MD  Primary Cardiologist is [name], MD PCP is [name], MD  Chief Complaint  Patient presents with  . Aortic Stenosis    Surgical eval for possible AVR, TEE 11/05/15, Cardiac Cath PENDING    HPI:  Patient is a 75 year old obese white male with history of aortic stenosis, chronic persistent atrial fibrillation, chronic diastolic congestive heart failure, hypertension, type 2 diabetes mellitus with complications, and stage III chronic kidney disease Who has been referred for surgical consultation to discuss treatment options for management of severe symptomatic aortic stenosis. The patient has a long-standing history of chronic diastolic congestive heart failure and atrial fibrillation.  He has been followed for several years by Dr. Lovena Le. Transthoracic echocardiograms have suggested the presence of moderate aortic stenosis with preserved left ventricular systolic function. The patient also has been treated for atrial fibrillation which has failed medical therapy with Tikosyn.  He has been chronically anticoagulated using Eliquis but he recently suffered a retinal hemorrhage that required surgical intervention. The patient states that he has been instructed to resume taking Eliquis at the previous dose but he has decreased the dose on his own because of concerns regarding risk of bleeding. He was referred to Dr. [name] to consider placement of a Watchman left atrial occluder.  Concerns were raised regarding whether or not the patient's aortic stenosis might be severe. The patient subsequently underwent transesophageal echocardiogram which confirmed the presence of severe aortic stenosis. The patient was referred for surgical consultation to discuss treatment options further.  The patient is married and lives  locally with his wife in [name]. 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 He retired in 1989. He has been obese for much of his adult life. He does not exercise on a regular basis although he does enjoy gardening and yard work. He reports a long-standing history of exertional shortness of breath dating back at least 4 or 5 years. Symptoms have been slowly progressing.  He now gets short of breath with moderate level activity and this limits his physical activities to a significant degree. He gets short of breath just walking to the mailbox and back. He denies any history of resting shortness of breath, PND, orthopnea, dizzy spells, syncope, or lower extremity edema. He has never had any chest pain or chest tightness either with activity or at rest.  He does not experience palpitations and cannot tell when he is in sinus rhythm or atrial fibrillation.  His activity level is also notable for some problems with arthritis as well as poor balance. He has a chronic tremor particularly involving his right arm.  Past Medical History:  Diagnosis Date  . Aortic stenosis 12/2010  . Atrial fibrillation ([place])   . Exogenous obesity   . GERD (gastroesophageal reflux disease)    occasional  . History of chicken pox   . Hypertension   . Kidney stones remote  . Lyme disease 2012   ?titers negative  . Type 2 diabetes, controlled, with retinopathy ([place]) 1980s  . Urge incontinence   . Vitamin B12 deficiency    IF normal (2014)    Past Surgical History:  Procedure Laterality Date  . BACK SURGERY  20 yrs ago   removal of lower back cartilage due to damage. Patient does not remember date of surgery.  Marland Kitchen CARDIOVERSION N/A 02/25/2015   Procedure: CARDIOVERSION;  Surgeon: Skeet Latch, MD;  Location: [facility];  Service: Cardiovascular;  Laterality: N/A;  . CATARACT EXTRACTION Bilateral 2015   [facility]  . finger sx    .  kidney stone removal    . TEE WITHOUT CARDIOVERSION N/A 11/05/2015   Procedure: TRANSESOPHAGEAL ECHOCARDIOGRAM (TEE);  Surgeon: Pixie Casino, MD;  [facility] END[facility]COPY;  Service: Cardiovascular;  Laterality: N/A;  . TOTAL HIP ARTHROPLASTY  03/12/2012   RIGHT TOTAL HIP ARTHROPLASTY ANTERIOR APPROACH;  Surgeon: Mcarthur Rossetti, MD;  . US ECHOCARDIOGRAPHY  12/2010   EF 50-55%, mild LVH, normal wall motion, high LV filling pressures, mild AS, LA mildly dilated    Family History  Problem Relation Age of Onset  . Diabetes Father   . Diabetes Mother   . Heart disease Father   . Heart disease Mother   . Cancer Brother     throat    Social History   Social History  . Marital status: Married    Spouse name: N/A  . Number of children: N/A  . Years of education: N/A   Occupational History  . Not on file.   Social History Main Topics  . Smoking status: Never Smoker  . Smokeless tobacco: Never Used  . Alcohol use Yes     Comment: Regular (bourbon and coke 3-4/day)  . Drug use: No  . Sexual activity: No   Other Topics Concern  . Not on file   Social History Narrative   Caffeine: coffee 1 cup/day   Lives with wife [name]), 1 cat   Occupation: retired, worked for [employer]   Edu: BS   Activity: no regular exercise   Diet: some water, fruits/vegetables daily      Advanced directives: has living will at home. Does not want prolonged life support "no extraordinary measures" but would accept temporary measures. Would want wife to be HCPOA.     Current Outpatient Prescriptions  Medication Sig Dispense Refill  . apixaban (ELIQUIS) 5 MG TABS tablet Take 1 tablet (5 mg total) by mouth 2 (two) times daily. 60 tablet 3  . cholecalciferol (VITAMIN D) 1000 UNITS tablet Take 1,000 Units by mouth daily.    Marland Kitchen dofetilide (TIKOSYN) 250 MCG capsule Take 1 capsule (250 mcg total) by mouth 2 (two) times daily. 60 capsule 11  . furosemide (LASIX) 20 MG tablet Take 20 mg by mouth daily. Per  Patient, Dr. Lovena Le stated he could take 40 mg per day by mouth, but patient doesn't always take 40mg  by mouth daily    . glipiZIDE (GLUCOTROL) 10 MG tablet Take 5-10 mg by mouth 2 (two) times daily before a meal. Take 10 mg by mouth daily with breakfast, 5 mg daily with dinner    . glucose blood (ONE TOUCH ULTRA TEST) test strip Use to check sugar once daily and as needed. Dx:E11.319 **One Touch Ultra** 100 each 2  . ibuprofen (ADVIL,MOTRIN) 400 MG tablet Take 400 mg by mouth 2 (two) times daily as needed (pain).    . Magnesium Oxide 250 MG TABS Take 1 tablet by mouth daily.    . metFORMIN (GLUCOPHAGE) 500 MG tablet Take 500 mg by mouth 2 (two) times daily with a meal.     . Multiple Vitamin (MULTIVITAMIN WITH MINERALS) TABS tablet Take by mouth daily. 1 packet of  5 vitamins daily    . multivitamin-lutein (OCUVITE-LUTEIN) CAPS capsule Take 3 capsules by mouth daily.    Marland Kitchen oxybutynin (DITROPAN) 5 MG tablet Take 1 tablet (5 mg total) by mouth 3 (three) times daily. 270 tablet 3  . vitamin B-12 (CYANOCOBALAMIN) 1000 MCG tablet Take 1,000 mcg by mouth daily.     No current facility-administered medications for this visit.     No Known Allergies    Review of Systems:   General:  normal appetite, decreased energy, no weight gain, no weight loss, no fever  Cardiac:  no chest pain with exertion, no chest pain at rest, + SOB with exertion, no resting SOB, no PND, no orthopnea, no palpitations, + arrhythmia, + atrial fibrillation, no LE edema, no dizzy spells, no syncope  Respiratory:  + shortness of breath, no home oxygen, no productive cough, no dry cough, no bronchitis, no wheezing, no hemoptysis, no asthma, no pain with inspiration or cough, no sleep apnea, no CPAP at night  GI:   no difficulty swallowing, no reflux, no frequent heartburn, no hiatal hernia, no abdominal pain, + constipation, no diarrhea, no hematochezia, no hematemesis, no melena  GU:   no dysuria,  no frequency, no urinary tract  infection, no hematuria, no enlarged prostate, no kidney stones, + kidney disease  Vascular:  no pain suggestive of claudication, no pain in feet, no leg cramps, no varicose veins, no DVT, no non-healing foot ulcer  Neuro:   no stroke, no TIA's, no seizures, no headaches, + temporary blindness one eye,  no slurred speech, no peripheral neuropathy, no chronic pain, + instability of gait, no memory/cognitive dysfunction  Musculoskeletal: + arthritis, no joint swelling, no myalgias, mild difficulty walking, somewhat reduced mobility   Skin:   no rash, no itching, no skin infections, no pressure sores or ulcerations  Psych:   no anxiety, no depression, no nervousness, no unusual recent stress  Eyes:   + blurry vision, no floaters, + recent vision changes, no wears glasses or contacts  ENT:   no hearing loss, no loose or painful teeth, no dentures, last saw dentist 6/17  Hematologic:  + easy bruising, no abnormal bleeding, no clotting disorder, no frequent epistaxis  Endocrine:  + diabetes, does check CBG's at home     Physical Exam:   BP 140/78 (BP Location: Left Arm, Patient Position: Sitting, Cuff Size: Large)   Pulse 69   Resp 18   Ht 6\' 2"  (1.88 m)   Wt 254 lb (115.2 kg)   SpO2 97% Comment: RA  BMI 32.61 kg/m   General:  Obese,  well-appearing  HEENT:  Unremarkable   Neck:   no JVD, no bruits, no adenopathy   Chest:   clear to auscultation, symmetrical breath sounds, no wheezes, no rhonchi   CV:   Irregular rate and rhythm, grade III/VI crescendo/decrescendo systolic murmur   Abdomen:  soft, non-tender, no masses   Extremities:  warm, well-perfused, pulses diminished, no LE edema  Rectal/GU  Deferred  Neuro:   Grossly non-focal and symmetrical throughout  Skin:   Clean and dry, no rashes, no breakdown   Diagnostic Tests:  Transthoracic Echocardiography  (Report amended )  Patient:    [name], [name] MR #:       [id number] Study Date: 10/21/2015 Gender:     M Age:         58 Height:     182.9 cm Weight:     122.8 kg BSA:  2.54 m^2 Pt. Status: Room:   ORDERING     Cristopher Peru, MD  REFERRING    Cristopher Peru, MD  ATTENDING    Fransico Him, MD  PERFORMING   [facility], Outpatient  SONOGRAPHER  [facility], RDCS  cc:  ------------------------------------------------------------------- LV EF: 55% -   60%  ------------------------------------------------------------------- Indications:      Aortic Stenosis (I35.9).  ------------------------------------------------------------------- History:   PMH:  ETOH Abuse, Obesity, Chronic Kidney Disease. Atrial fibrillation.  Congestive heart failure.  Risk factors: Family history of coronary artery disease. Hypertension. Diabetes mellitus. Dyslipidemia.  ------------------------------------------------------------------- Study Conclusions  - Left ventricle: The cavity size was normal. There was moderate   concentric hypertrophy. Systolic function was normal. The   estimated ejection fraction was in the range of 55% to 60%. Wall   motion was normal; there were no regional wall motion   abnormalities. - Aortic valve: Severe diffuse thickening and calcification. Valve   mobility was restricted. There was moderate stenosis. Mean   gradient (S): 25 mm Hg. Valve area (VTI): 1.18 cm^2. Valve area   (Vmax): 1.08 cm^2. Valve area (Vmean): 1.16 cm^2. - Mitral valve: Calcified annulus. Mild diffuse calcification.   There was mild regurgitation. - Right ventricle: The cavity size was severely dilated. Wall   thickness was normal. - Right atrium: The atrium was moderately dilated. - Tricuspid valve: There was trivial regurgitation.  Impressions:  - Normal LVF, moderate LVF, moderate to severe AS. Compared to   prior echo, AS as progressed.  Transthoracic echocardiography.  M-mode, complete 2D, spectral Doppler, and color Doppler.  Birthdate:  Patient birthdate: Sep 30, 1940.  Age:  Patient is 75  yr old.  Sex:  Gender: male. BMI: 36.7 kg/m^2.  Blood pressure:     130/84  Patient status: Outpatient.  Study date:  Study date: 10/21/2015. Study time: 08:08 AM.  Location:  [facility]  -------------------------------------------------------------------  ------------------------------------------------------------------- Left ventricle:  The cavity size was normal. There was moderate concentric hypertrophy. Systolic function was normal. The estimated ejection fraction was in the range of 55% to 60%. Wall motion was normal; there were no regional wall motion abnormalities.  ------------------------------------------------------------------- Aortic valve:   Probably trileaflet.  Severe diffuse thickening and calcification. Valve mobility was restricted.  Doppler:   There was moderate stenosis.   There was no regurgitation.    VTI ratio of LVOT to aortic valve: 0.24. Valve area (VTI): 1.18 cm^2. Indexed valve area (VTI): 0.46 cm^2/m^2. Peak velocity ratio of LVOT to aortic valve: 0.22. Valve area (Vmax): 1.08 cm^2. Indexed valve area (Vmax): 0.42 cm^2/m^2. Mean velocity ratio of LVOT to aortic valve: 0.24. Valve area (Vmean): 1.16 cm^2. Indexed valve area (Vmean): 0.46 cm^2/m^2.    Mean gradient (S): 25 mm Hg. Peak gradient (S): 47 mm Hg.  ------------------------------------------------------------------- Aorta:  Aortic root: The aortic root was normal in size.  ------------------------------------------------------------------- Mitral valve:   Calcified annulus.  Mild diffuse calcification. Mobility was not restricted.  Doppler:  Transvalvular velocity was within the normal range. There was no evidence for stenosis. There was mild regurgitation.  ------------------------------------------------------------------- Left atrium:  The atrium was normal in size.  ------------------------------------------------------------------- Right ventricle:  The cavity size was  severely dilated. Wall thickness was normal. Systolic function was normal.  ------------------------------------------------------------------- Pulmonic valve:    Structurally normal valve.   Cusp separation was normal.  Doppler:  Transvalvular velocity was within the normal range. There was no evidence for stenosis. There was no regurgitation.  ------------------------------------------------------------------- Tricuspid valve:   Structurally normal  valve.    Doppler: Transvalvular velocity was within the normal range. There was trivial regurgitation.  ------------------------------------------------------------------- Pulmonary artery:   The main pulmonary artery was normal-sized. Systolic pressure could not be accurately estimated.  ------------------------------------------------------------------- Right atrium:  The atrium was moderately dilated.  ------------------------------------------------------------------- Pericardium:  There was no pericardial effusion.  ------------------------------------------------------------------- Systemic veins: Inferior vena cava: The vessel was normal in size. The respirophasic diameter changes were in the normal range (= 50%), consistent with normal central venous pressure. Diameter: 18.8 mm.  ------------------------------------------------------------------- Measurements   IVC                                       Value          Reference  ID                                        18.8  mm       ---------    Left ventricle                            Value          Reference  LV ID, ED, PLAX chordal           (L)     41.6  mm       43 - 52  LV ID, ES, PLAX chordal                   24.4  mm       23 - 38  LV fx shortening, PLAX chordal            41    %        >=29  LV PW thickness, ED                       15.9  mm       ---------  IVS/LV PW ratio, ED                       1.05           <=1.3  Stroke volume, 2D                          98    ml       ---------  Stroke volume/bsa, 2D                     39    ml/m^2   ---------  LV e&', lateral                            9.36  cm/s     ---------  LV e&', medial                             15.1  cm/s     ---------  LV e&', average                            12.23 cm/s     ---------  Ventricular septum                        Value          Reference  IVS thickness, ED                         16.7  mm       ---------    LVOT                                      Value          Reference  LVOT ID, S                                25    mm       ---------  LVOT area                                 4.91  cm^2     ---------  LVOT peak velocity, S                     75.4  cm/s     ---------  LVOT mean velocity, S                     53    cm/s     ---------  LVOT VTI, S                               19.9  cm       ---------    Aortic valve                              Value          Reference  Aortic valve peak velocity, S             344   cm/s     ---------  Aortic valve mean velocity, S             224   cm/s     ---------  Aortic valve VTI, S                       82.8  cm       ---------  Aortic mean gradient, S                   25    mm Hg    ---------  Aortic peak gradient, S                   47    mm Hg    ---------  VTI ratio, LVOT/AV                        0.24           ---------  Aortic valve area, VTI                    1.18  cm^2     ---------  Aortic valve area/bsa, VTI                0.46  cm^2/m^2 ---------  Velocity ratio, peak, LVOT/AV             0.22           ---------  Aortic valve area, peak velocity          1.08  cm^2     ---------  Aortic valve area/bsa, peak               0.42  cm^2/m^2 ---------  velocity  Velocity ratio, mean, LVOT/AV             0.24           ---------  Aortic valve area, mean velocity          1.16  cm^2     ---------  Aortic valve area/bsa, mean               0.46  cm^2/m^2 ---------  velocity    Aorta                                      Value          Reference  Aortic root ID, ED                        36    mm       ---------  Ascending aorta ID, A-P, S                35    mm       ---------    Left atrium                               Value          Reference  LA ID, A-P, ES                            51    mm       ---------  LA ID/bsa, A-P                            2.01  cm/m^2   <=2.2  LA volume, ES, 1-p A4C                    109   ml       ---------  LA volume/bsa, ES, 1-p A4C                42.9  ml/m^2   ---------  LA volume, ES, 1-p A2C                    227   ml       ---------  LA volume/bsa, ES, 1-p A2C                89.3  ml/m^2   ---------    Tricuspid valve                           Value          Reference  Tricuspid regurg  peak velocity            272   cm/s     ---------  Tricuspid peak RV-RA gradient             30    mm Hg    ---------    Right ventricle                           Value          Reference  TAPSE                                     24    mm       ---------  Legend: (L)  and  (H)  mark values outside specified reference range.  ------------------------------------------------------------------- Michaelle Birks, MD 2017-06-01T09:38:21   Transesophageal Echocardiography  Patient:    Mubarak, Pelon MR #:       [id number] Study Date: 11/05/2015 Gender:     M Age:        56 Height:     188 cm Weight:     120.9 kg BSA:        2.55 m^2 Pt. Status: Room:   SONOGRAPHER  Tresa Res, RDCS  ADMITTING    Lyman Bishop MD  Carlyle MD  Demorest MD  Emelia Loron, Amber K  REFERRING    Lynnell Jude, Amber K  cc:  ------------------------------------------------------------------- LV EF: 60% -   65%  ------------------------------------------------------------------- Indications:      Atrial fibrillation -  427.31.  ------------------------------------------------------------------- Study Conclusions  - Left ventricle: There was mild concentric hypertrophy. Systolic   function was normal. The estimated ejection fraction was in the   range of 60% to 65%. Wall motion was normal; there were no   regional wall motion abnormalities. - Aortic valve: Heavily calcified with restricted leaflet motion.   There is severe aortic stenosis - AVA of 0.93 cm2 - mean gradient   of 43 mmHg and peak gradient of 71 mmHg. Valve area (VTI): 0.99   cm^2. Valve area (Vmax): 1.27 cm^2. Valve area (Vmean): 1.09   cm^2. - Aorta: Mild atheromatous disease. - Mitral valve: Mildly thickened leaflets . There was mild   regurgitation. - Left atrium: The atrium was dilated. No evidence of thrombus in   the atrial cavity or appendage. The appendage is windsock shaped. - Pulmonary veins: No anomaly. - Right atrium: No evidence of thrombus in the atrial cavity or   appendage. - Atrial septum: No defect or patent foramen ovale was identified.  Impressions:  - Severe aortic stenosis - AVA 0.93 cm2 with mean gradient of 43   mmHg. Maximal appendage measurement of 3.15 x 3.47 cm - windsock   appearance. While this may be suitable for Watchman, given the   severity of his aortic stenosis- evaluation for aortic valve   replacement +/- MAZE and LAA surgical closure is recommended.  Diagnostic transesophageal echocardiography.  2D and color Doppler.  Birthdate:  Patient birthdate: 1940/11/03.  Age:  Patient is 75 yr old.  Sex:  Gender: male.    BMI: 34.2 kg/m^2.  Blood pressure: 126/82  Patient status:  Outpatient.  Study date:  Study date: 11/05/2015. Study time: 09:07 AM.  Location:  Endoscopy.  -------------------------------------------------------------------  ------------------------------------------------------------------- Left ventricle:  There was mild concentric hypertrophy. Systolic function was  normal. The estimated ejection fraction was in the range of 60% to 65%. Wall motion was normal; there were no regional wall motion abnormalities.  ------------------------------------------------------------------- Aortic valve:  Heavily calcified with restricted leaflet motion. There is severe aortic stenosis - AVA of 0.93 cm2 - mean gradient of 43 mmHg and peak gradient of 71 mmHg.  Doppler:     VTI ratio of LVOT to aortic valve: 0.19. Valve area (VTI): 0.99 cm^2. Indexed valve area (VTI): 0.39 cm^2/m^2. Peak velocity ratio of LVOT to aortic valve: 0.24. Valve area (Vmax): 1.27 cm^2. Indexed valve area (Vmax): 0.5 cm^2/m^2. Mean velocity ratio of LVOT to aortic valve: 0.21. Valve area (Vmean): 1.09 cm^2. Indexed valve area (Vmean): 0.43 cm^2/m^2.    Mean gradient (S): 40 mm Hg. Peak gradient (S): 65 mm Hg.  ------------------------------------------------------------------- Aorta:  Mild atheromatous disease.  ------------------------------------------------------------------- Mitral valve:   Mildly thickened leaflets .  Doppler:  There was mild regurgitation.  ------------------------------------------------------------------- Left atrium:  The atrium was dilated.  No evidence of thrombus in the atrial cavity or appendage. The maximal appendage measurement was 3.15 cm x 3.47 cm. The appendage is windsock shaped.  ------------------------------------------------------------------- Atrial septum:  No defect or patent foramen ovale was identified.   ------------------------------------------------------------------- Pulmonary veins:  No anomaly.  ------------------------------------------------------------------- Right ventricle:  The cavity size was normal. Wall thickness was normal. Systolic function was normal.  ------------------------------------------------------------------- Pulmonic valve:   Poorly  visualized.  ------------------------------------------------------------------- Tricuspid valve:   Doppler:  There was trivial regurgitation.   ------------------------------------------------------------------- Right atrium:  The atrium was normal in size.  No evidence of thrombus in the atrial cavity or appendage.  ------------------------------------------------------------------- Pericardium:  There was no pericardial effusion.   ------------------------------------------------------------------- Post procedure conclusions Ascending Aorta:  - Mild atheromatous disease.  ------------------------------------------------------------------- Measurements   Left ventricle                           Value  Stroke volume, 2D                        98    ml  Stroke volume/bsa, 2D                    38    ml/m^2    LVOT                                     Value  LVOT ID, S                               26    mm  LVOT area                                5.31  cm^2  LVOT peak velocity, S                    96.6  cm/s  LVOT mean velocity, S                    59.8  cm/s  LVOT VTI, S  18.4  cm    Aortic valve                             Value  Aortic valve peak velocity, S            403   cm/s  Aortic valve mean velocity, S            291   cm/s  Aortic valve VTI, S                      98.8  cm  Aortic mean gradient, S                  40    mm Hg  Aortic peak gradient, S                  65    mm Hg  VTI ratio, LVOT/AV                       0.19  Aortic valve area, VTI                   0.99  cm^2  Aortic valve area/bsa, VTI               0.39  cm^2/m^2  Velocity ratio, peak, LVOT/AV            0.24  Aortic valve area, peak velocity         1.27  cm^2  Aortic valve area/bsa, peak velocity     0.5   cm^2/m^2  Velocity ratio, mean, LVOT/AV            0.21  Aortic valve area, mean velocity         1.09  cm^2  Aortic valve area/bsa, mean velocity      0.43  cm^2/m^2  Legend: (L)  and  (H)  mark values outside specified reference range.  ------------------------------------------------------------------- Prepared and Electronically Authenticated by  Lyman Bishop MD 2017-06-16T17:29:07   STS Risk Calculator  Procedure    AVR  Risk of Mortality   2.9% Morbidity or Mortality  20.9% Prolonged LOS   8.0% Short LOS    30.9% Permanent Stroke   1.4% Prolonged Vent Support  12.5% DSW Infection    0.5% Renal Failure    8.9% Reoperation    7.3%    Impression:  Patient has stage D severe symptomatic aortic stenosis. He presents with a long history of progressive symptoms of exertional shortness of breath and fatigue consistent with chronic diastolic congestive heart failure, New York Heart Association function class IIb. I have personally reviewed the patient's recent transthoracic and transesophageal echocardiograms. The patient's aortic valve is tricuspid with moderate to severe thickening, calcification, and restricted leaflet mobility involving all 3 leaflets.  Previous transthoracic echocardiograms have demonstrated transvalvular gradients consistent with moderate disease, but recent transesophageal echocardiogram confirms that the patient really has severe aortic stenosis.  Peak velocity across the aortic valve measured greater than 4 m/s corresponding to mean transvalvular gradient estimated 48 mmHg.  Left ventricular systolic function remains preserved.  Risks associated with conventional surgical aortic valve replacement will be reasonably low but somewhat elevated because of the patient's numerous comorbid medical problems.  Given the patient's history of long-standing atrial fibrillation and problems with taking long-term anticoagulation, he would certainly benefit from obliteration of his left atrial appendage and he  might do well following Maze procedure.   Plan:  The patient and his wife were counseled at length regarding  treatment alternatives for management of severe symptomatic aortic stenosis.  The natural history of aortic stenosis and long-term prognosis with medical therapy were discussed.  Alternative approaches such as conventional surgical aortic valve replacement with or without maze procedure, transcatheter aortic valve replacement with or without Watchman left atrial occluder placement, and palliative medical therapy were compared and contrasted at length. This discussion was placed in the context of the patient's own specific clinical presentation and past medical history.  All of their questions been addressed.  The patient is interested in proceeding with conventional surgical aortic valve replacement and Maze procedure in the near future. As a next step he will undergo noncontrast CT scan of the chest to evaluate his ascending aorta. CT angiography will not be performed because of the patient's underlying chronic kidney disease. Assuming that the patient does not have significant aortic calcification, then he will need to undergo left and right heart catheterization.  The patient also will be referred for formal physical therapy evaluation to evaluate concerns regarding his instability of gait and the possibility that he may need formal physical therapy during his postoperative convalescence. The patient will return to our office in 2 weeks to review the results of his CT scan, cath, and physical therapy consultation. All of his questions have been addressed.    I spent in excess of 90 minutes during the conduct of this office consultation and >50% of this time involved direct face-to-face encounter with the patient for counseling and/or coordination of their care.    Valentina Gu. Roxy Manns, MD 12/27/2015 5:36 PM

## 2015-12-29 ENCOUNTER — Ambulatory Visit: Payer: PPO | Attending: Thoracic Surgery (Cardiothoracic Vascular Surgery) | Admitting: Physical Therapy

## 2015-12-29 ENCOUNTER — Encounter: Payer: Self-pay | Admitting: Physical Therapy

## 2015-12-29 DIAGNOSIS — R2689 Other abnormalities of gait and mobility: Secondary | ICD-10-CM | POA: Diagnosis not present

## 2015-12-29 DIAGNOSIS — R293 Abnormal posture: Secondary | ICD-10-CM

## 2015-12-29 NOTE — Therapy (Signed)
[place], [place], [address] Phone: [phone]   Fax:  [phone]  Physical Therapy Evaluation  Patient Details  Name: Bill [name] MRN: [id number] Date of Birth: [birth year] Referring Provider: Dr. Darylene Price  Encounter Date: 12/29/2015      PT End of Session - 12/29/15 1314    Visit Number 1   Number of Visits 1   PT Start Time 1223   PT Stop Time [id number]   PT Time Calculation (min) 42 min   Activity Tolerance Patient tolerated treatment well      Past Medical History:  Diagnosis Date  . Aortic stenosis 12/2010  . Atrial fibrillation ([place])   . Exogenous obesity   . GERD (gastroesophageal reflux disease)    occasional  . History of chicken pox   . Hypertension   . Kidney stones remote  . Lyme disease 2012   ?titers negative  . Type 2 diabetes, controlled, with retinopathy ([place]) 1980s  . Urge incontinence   . Vitamin B12 deficiency    IF normal (2014)    Past Surgical History:  Procedure Laterality Date  . BACK SURGERY  20 yrs ago   removal of lower back cartilage due to damage. Patient does not remember date of surgery.  Marland Kitchen CARDIOVERSION N/A 02/25/2015   Procedure: CARDIOVERSION;  Surgeon: Skeet Latch, MD;  Location: [facility];  Service: Cardiovascular;  Laterality: N/A;  . CATARACT EXTRACTION Bilateral 2015   [facility]  . finger sx    . kidney stone removal    . TEE WITHOUT CARDIOVERSION N/A 11/05/2015   Procedure: TRANSESOPHAGEAL ECHOCARDIOGRAM (TEE);  Surgeon: Pixie Casino, MD;  Location[facility];  Service: Cardiovascular;  Laterality: N/A;  . TOTAL HIP ARTHROPLASTY  03/12/2012   RIGHT TOTAL HIP ARTHROPLASTY ANTERIOR APPROACH;  Surgeon: Mcarthur Rossetti, MD;  . US ECHOCARDIOGRAPHY  12/2010   EF 50-55%, mild LVH, normal wall motion, high LV filling pressures, mild AS, LA mildly dilated    There were no vitals filed for this visit.        Subjective Assessment - 12/29/15 1227    Subjective hx of shortness of breath over 4-5 years, gradually worsening, now unable to split wood, otherwise able to maintain normal activities, gets mild to moderate shortness of breath when walking up inclines/stairs   Patient Stated Goals to get heart better and maintain activity, continue to work small jobs   Currently in Pain? No/denies            [facility] PT Assessment - 12/29/15 0001      Assessment   Medical Diagnosis severe aortic stenosis   Referring Provider Dr. Darylene Price   Onset Date/Surgical Date 12/27/15     Precautions   Precautions None     Restrictions   Weight Bearing Restrictions No     Balance Screen   Has the patient fallen in the past 6 months No  fell last year, tripped over dog leash   Has the patient had a decrease in activity level because of a fear of falling?  No  just careful   Is the patient reluctant to leave their home because of a fear of falling?  No     Home Environment   Living Environment Private residence   Living Arrangements Spouse/significant other   Type of [place] to enter   Entrance Stairs-Number of Steps 3-4   Entrance Stairs-Rails Right;Left;Cannot reach both  Home Layout Two level;Able to live on main level with bedroom/bathroom     Prior Function   Level of Independence Independent with household mobility without device;Independent with community mobility without device     Posture/Postural Control   Posture/Postural Control Postural limitations   Postural Limitations Forward head;Rounded Shoulders     ROM / Strength   AROM / PROM / Strength AROM;Strength     AROM   Overall AROM Comments Grossly WNL, RUE limited in shoulder flexion, pt reports history of RTC bil.      Strength   Overall Strength Comments grossly 4 to 5 throughout UE and lower extremities   Strength Assessment Site Hand   Right/Left hand Right;Left   Right Hand Grip (lbs) 65  R  hand dominant   Left Hand Grip (lbs) 60     Ambulation/Gait   Gait Comments Pt does not demonstrate any significant gait deviations other than poor posture and a hesitancy with 180 degree turns. Pt is able to change speeds and perform sudden stops without evidence of imbalance.           [facility] Pre-Surgical Assessment - 12/29/15 0001    5 Meter Walk Test- trial 1 4 sec   5 Meter Walk Test- trial 2 4 sec.    5 Meter Walk Test- trial 3 5 sec.  </= 6 sec considered normal walking speed   5 meter walk test average 4.33 sec   Timed Up & Go Test trial  13 sec.   Comments >/= 12 sec indicative if increased fall risk   4 Stage Balance Test tolerated for:  1 sec.   4 Stage Balance Test Position 4   comment not indicative of high fall risk   Sit To Stand Test- trial 1 16 sec.   Comment </= 12.6 sec WNL for age/gender, pt rated 1/10 shortness of breath of the Modified Borg Rating for Perceived Dyspnea   ADL/IADL Independent with: Bathing;Meal prep;Dressing;Finances;Yard work   6 Minute Walk- Baseline yes   BP (mmHg) 142/80   HR (bpm) 65   02 Sat (%RA) 95 %   Modified Borg Scale for Dyspnea 0- Nothing at all   Perceived Rate of Exertion (Borg) 6-   6 Minute Walk Post Test yes   BP (mmHg) 160/84   HR (bpm) 98   02 Sat (%RA) 96 %   Modified Borg Scale for Dyspnea 2- Mild shortness of breath   Perceived Rate of Exertion (Borg) 9- very light   Aerobic Endurance Distance Walked 1200   Endurance additional comments Pt did not require any rest breaks during 6 minute walk although his speed did slow in the latter half of the test.                                      Plan - 12/29/15 1314    Clinical Impression Statement Pt is a 75 yo male presenting to OP PT for evaluation prior to open AVR surgery due to severe aortic stenosis. Pt reports a 4-5 year history of progressively worsening shortness of breath which he primarily notices when ambulating on inclines and  stairs. Pt is not longer able to split wood but is otherwise able to carry on with his daily activities. Pt presents with good ROM/strength, good walking speed, mild imbalance with 180 degree turns which led to high fall risk score on Timed up and go.  Pt did not score at high fall risk per 4 stage balance test. Pt able to ambulate about 70% the distance of a 73-79 male normative value in the 6 minute walk test without rest breaks required and a 2/10 rating on the Modified Borg Scale for Dyspnea.     PT Frequency One time visit   Consulted and Agree with Plan of Care Patient      Patient demonstrated the following deficits and impairments:     Visit Diagnosis: Other abnormalities of gait and mobility - Plan: PT plan of care cert/re-cert  Abnormal posture - Plan: PT plan of care cert/re-cert      G-Codes - [id number]    Functional Assessment Tool Used 6 minute walk 1200' (70% of normal for age/gender)   Functional Limitation Mobility: Walking and moving around   Mobility: Walking and Moving Around Current Status [id number]) At least 20 percent but less than 40 percent impaired, limited or restricted   Mobility: Walking and Moving Around Goal Status [phone]) At least 20 percent but less than 40 percent impaired, limited or restricted   Mobility: Walking and Moving Around Discharge Status [phone]) At least 20 percent but less than 40 percent impaired, limited or restricted       Problem List Patient Active Problem List   Diagnosis Date Noted  . Paroxysmal a-fib ([place])   . Aortic stenosis, severe   . Diabetic peripheral neuropathy associated with type 2 diabetes mellitus ([place]) 02/19/2015  . Advanced care planning/counseling discussion 09/15/2014  . Tremor 09/15/2014  . Hyperkalemia 09/15/2014  . Perichondritis of left ear 02/26/2014  . Chronic diastolic heart failure ([place]) 10/16/2013  . CKD stage 3 due to type 2 diabetes mellitus ([place]) 05/15/2013  . Metatarsalgia of right foot 05/09/2013   . Sleep disorder 09/06/2012  . Vitamin B12 deficiency 09/05/2012  . Degenerative arthritis of hip 03/12/2012  . Atrial fibrillation ([place]) 03/11/2012  . Chronic lower back pain 08/23/2011  . Urge incontinence   . Moderate to severe aortic stenosis 01/05/2011  . Vitamin D deficiency 06/03/2008  . Obesity, Class II, BMI 35-39.9, with comorbidity ([place]) 06/03/2008  . False positive serological test for syphilis 06/03/2008  . BENIGN PROSTATIC HYPERTROPHY, HX OF 06/03/2008  . Type 2 diabetes, controlled, with retinopathy ([place]) 04/23/2007  . HLD (hyperlipidemia) 04/23/2007  . Essential hypertension 12/03/2006    Romualdo Bolk, PT 12/29/2015, 1:21 PM  [facility] [address] [place], [place], [address] Phone: [phone]   Fax:  [phone]  Name: ASHAY MALIA MRN: [id number] Date of Birth: [birth year]

## 2015-12-31 ENCOUNTER — Ambulatory Visit
Admission: RE | Admit: 2015-12-31 | Discharge: 2015-12-31 | Disposition: A | Discharge status: Home / Self Care | Payer: PPO | Source: Ambulatory Visit | Attending: Thoracic Surgery (Cardiothoracic Vascular Surgery) | Admitting: Thoracic Surgery (Cardiothoracic Vascular Surgery)

## 2015-12-31 DIAGNOSIS — R918 Other nonspecific abnormal finding of lung field: Secondary | ICD-10-CM | POA: Diagnosis not present

## 2015-12-31 DIAGNOSIS — I350 Nonrheumatic aortic (valve) stenosis: Secondary | ICD-10-CM

## 2016-01-05 ENCOUNTER — Other Ambulatory Visit: Payer: Self-pay | Admitting: Cardiovascular Disease

## 2016-01-05 ENCOUNTER — Encounter (HOSPITAL_COMMUNITY)
Admission: RE | Disposition: A | Discharge status: Home / Self Care | Payer: Self-pay | Source: Ambulatory Visit | Attending: Cardiovascular Disease

## 2016-01-05 ENCOUNTER — Ambulatory Visit (HOSPITAL_COMMUNITY)
Admission: RE | Admit: 2016-01-05 | Discharge: 2016-01-05 | Disposition: A | Discharge status: Home / Self Care | Payer: PPO | Source: Ambulatory Visit | Attending: Cardiovascular Disease | Admitting: Cardiovascular Disease

## 2016-01-05 DIAGNOSIS — Z8249 Family history of ischemic heart disease and other diseases of the circulatory system: Secondary | ICD-10-CM | POA: Diagnosis not present

## 2016-01-05 DIAGNOSIS — Z801 Family history of malignant neoplasm of trachea, bronchus and lung: Secondary | ICD-10-CM | POA: Insufficient documentation

## 2016-01-05 DIAGNOSIS — I481 Persistent atrial fibrillation: Secondary | ICD-10-CM | POA: Insufficient documentation

## 2016-01-05 DIAGNOSIS — E1122 Type 2 diabetes mellitus with diabetic chronic kidney disease: Secondary | ICD-10-CM | POA: Insufficient documentation

## 2016-01-05 DIAGNOSIS — N183 Chronic kidney disease, stage 3 (moderate): Secondary | ICD-10-CM | POA: Diagnosis not present

## 2016-01-05 DIAGNOSIS — I482 Chronic atrial fibrillation, unspecified: Secondary | ICD-10-CM

## 2016-01-05 DIAGNOSIS — I5032 Chronic diastolic (congestive) heart failure: Secondary | ICD-10-CM | POA: Diagnosis not present

## 2016-01-05 DIAGNOSIS — Z7901 Long term (current) use of anticoagulants: Secondary | ICD-10-CM | POA: Insufficient documentation

## 2016-01-05 DIAGNOSIS — Z833 Family history of diabetes mellitus: Secondary | ICD-10-CM | POA: Diagnosis not present

## 2016-01-05 DIAGNOSIS — Z6832 Body mass index (BMI) 32.0-32.9, adult: Secondary | ICD-10-CM | POA: Insufficient documentation

## 2016-01-05 DIAGNOSIS — E669 Obesity, unspecified: Secondary | ICD-10-CM | POA: Insufficient documentation

## 2016-01-05 DIAGNOSIS — N3941 Urge incontinence: Secondary | ICD-10-CM | POA: Insufficient documentation

## 2016-01-05 DIAGNOSIS — I2584 Coronary atherosclerosis due to calcified coronary lesion: Secondary | ICD-10-CM | POA: Insufficient documentation

## 2016-01-05 DIAGNOSIS — I130 Hypertensive heart and chronic kidney disease with heart failure and stage 1 through stage 4 chronic kidney disease, or unspecified chronic kidney disease: Secondary | ICD-10-CM | POA: Diagnosis not present

## 2016-01-05 DIAGNOSIS — E538 Deficiency of other specified B group vitamins: Secondary | ICD-10-CM | POA: Insufficient documentation

## 2016-01-05 DIAGNOSIS — I2510 Atherosclerotic heart disease of native coronary artery without angina pectoris: Secondary | ICD-10-CM | POA: Diagnosis not present

## 2016-01-05 DIAGNOSIS — K219 Gastro-esophageal reflux disease without esophagitis: Secondary | ICD-10-CM | POA: Insufficient documentation

## 2016-01-05 DIAGNOSIS — Z7984 Long term (current) use of oral hypoglycemic drugs: Secondary | ICD-10-CM | POA: Diagnosis not present

## 2016-01-05 DIAGNOSIS — I350 Nonrheumatic aortic (valve) stenosis: Secondary | ICD-10-CM | POA: Diagnosis not present

## 2016-01-05 HISTORY — PX: CARDIAC CATHETERIZATION: SHX172

## 2016-01-05 LAB — CBC
HCT: 40.7 % (ref 39.0–52.0)
Hemoglobin: 13.3 g/dL (ref 13.0–17.0)
MCH: 32.6 pg (ref 26.0–34.0)
MCHC: 32.7 g/dL (ref 30.0–36.0)
MCV: 99.8 fL (ref 78.0–100.0)
Platelets: 175 10*3/uL (ref 150–400)
RBC: 4.08 MIL/uL — ABNORMAL LOW (ref 4.22–5.81)
RDW: 13.2 % (ref 11.5–15.5)
WBC: 6.5 10*3/uL (ref 4.0–10.5)

## 2016-01-05 LAB — GLUCOSE, CAPILLARY
GLUCOSE-CAPILLARY: 161 mg/dL — AB (ref 65–99)
GLUCOSE-CAPILLARY: 132 mg/dL — AB (ref 65–99)

## 2016-01-05 LAB — BASIC METABOLIC PANEL
Anion gap: 7 (ref 5–15)
BUN: 22 mg/dL — AB (ref 6–20)
CHLORIDE: 104 mmol/L (ref 101–111)
CO2: 27 mmol/L (ref 22–32)
CREATININE: 1.51 mg/dL — AB (ref 0.61–1.24)
Calcium: 10.1 mg/dL (ref 8.9–10.3)
GFR calc Af Amer: 50 mL/min — ABNORMAL LOW (ref >60)
GFR calc non Af Amer: 43 mL/min — ABNORMAL LOW (ref >60)
GLUCOSE: 178 mg/dL — AB (ref 65–99)
POTASSIUM: 4.5 mmol/L (ref 3.5–5.1)
SODIUM: 138 mmol/L (ref 135–145)

## 2016-01-05 LAB — POCT I-STAT 3, VENOUS BLOOD GAS (G3P V)
Acid-base deficit: 1 mmol/L (ref 0.0–2.0)
BICARBONATE: 24 meq/L (ref 20.0–24.0)
O2 Saturation: 66 %
PCO2 VEN: 40 mmHg — AB (ref 45.0–50.0)
PH VEN: 7.386 — AB (ref 7.250–7.300)
PO2 VEN: 35 mmHg (ref 31.0–45.0)
TCO2: 25 mmol/L (ref 0–100)

## 2016-01-05 LAB — PROTIME-INR
INR: 1.1
PROTHROMBIN TIME: 14.3 s (ref 11.4–15.2)

## 2016-01-05 SURGERY — RIGHT/LEFT HEART CATH AND CORONARY ANGIOGRAPHY
Anesthesia: LOCAL

## 2016-01-05 MED ORDER — SODIUM CHLORIDE 0.9% FLUSH: 3.0000 mL | Freq: Two times a day (BID) | INTRAVENOUS | Status: DC

## 2016-01-05 MED ORDER — VERAPAMIL HCL 2.5 MG/ML IV SOLN
INTRAVENOUS | Status: DC | PRN
  Administered 2016-01-05: 10 mL via INTRA_ARTERIAL

## 2016-01-05 MED ORDER — SODIUM CHLORIDE 0.9 % IV SOLN: 250.0000 mL | INTRAVENOUS | Status: DC | PRN

## 2016-01-05 MED ORDER — APIXABAN 5 MG PO TABS: 5.0000 mg | ORAL_TABLET | Freq: Two times a day (BID) | ORAL | 3 refills | Status: DC

## 2016-01-05 MED ORDER — FENTANYL CITRATE (PF) 100 MCG/2ML IJ SOLN
INTRAMUSCULAR | Status: AC
  Filled 2016-01-05: qty 2

## 2016-01-05 MED ORDER — HEPARIN (PORCINE) IN NACL 2-0.9 UNIT/ML-% IJ SOLN
INTRAMUSCULAR | Status: DC | PRN
  Administered 2016-01-05: 1000 mL via INTRA_ARTERIAL

## 2016-01-05 MED ORDER — LIDOCAINE HCL (PF) 1 % IJ SOLN
INTRAMUSCULAR | Status: DC | PRN
  Administered 2016-01-05: 2 mL via INTRADERMAL
  Administered 2016-01-05: 7 mL via INTRADERMAL

## 2016-01-05 MED ORDER — HEPARIN (PORCINE) IN NACL 2-0.9 UNIT/ML-% IJ SOLN
INTRAMUSCULAR | Status: AC
  Filled 2016-01-05: qty 1000

## 2016-01-05 MED ORDER — FENTANYL CITRATE (PF) 100 MCG/2ML IJ SOLN
INTRAMUSCULAR | Status: DC | PRN
  Administered 2016-01-05: 25 ug via INTRAVENOUS

## 2016-01-05 MED ORDER — MIDAZOLAM HCL 2 MG/2ML IJ SOLN
INTRAMUSCULAR | Status: DC | PRN
Start: 2016-01-05 — End: 2016-01-05
  Administered 2016-01-05: 1 mg via INTRAVENOUS

## 2016-01-05 MED ORDER — MIDAZOLAM HCL 2 MG/2ML IJ SOLN
INTRAMUSCULAR | Status: AC
  Filled 2016-01-05: qty 2

## 2016-01-05 MED ORDER — ASPIRIN 81 MG PO CHEW
81.0000 mg | CHEWABLE_TABLET | Freq: Once | ORAL | Status: AC
  Administered 2016-01-05 (×2): 81 mg via ORAL

## 2016-01-05 MED ORDER — ASPIRIN 81 MG PO CHEW
CHEWABLE_TABLET | ORAL | Status: AC
  Administered 2016-01-05: 81 mg via ORAL
  Filled 2016-01-05: qty 1

## 2016-01-05 MED ORDER — SODIUM CHLORIDE 0.9% FLUSH: 3.0000 mL | INTRAVENOUS | Status: DC | PRN

## 2016-01-05 MED ORDER — SODIUM CHLORIDE 0.9 % IV SOLN
INTRAVENOUS | Status: DC
Start: 2016-01-05 — End: 2016-01-05

## 2016-01-05 MED ORDER — HEPARIN SODIUM (PORCINE) 1000 UNIT/ML IJ SOLN
INTRAMUSCULAR | Status: AC
  Filled 2016-01-05: qty 1

## 2016-01-05 MED ORDER — LIDOCAINE HCL (PF) 1 % IJ SOLN
INTRAMUSCULAR | Status: AC
  Filled 2016-01-05: qty 30

## 2016-01-05 MED ORDER — SODIUM CHLORIDE 0.9 % WEIGHT BASED INFUSION
3.0000 mL/kg/h | INTRAVENOUS | Status: DC
  Administered 2016-01-05: 3 mL/kg/h via INTRAVENOUS

## 2016-01-05 MED ORDER — SODIUM CHLORIDE 0.9 % WEIGHT BASED INFUSION: 1.0000 mL/kg/h | INTRAVENOUS | Status: DC

## 2016-01-05 MED ORDER — HEPARIN SODIUM (PORCINE) 1000 UNIT/ML IJ SOLN
INTRAMUSCULAR | Status: DC | PRN
Start: 2016-01-05 — End: 2016-01-05
  Administered 2016-01-05: 6000 [IU] via INTRAVENOUS

## 2016-01-05 MED ORDER — IOPAMIDOL (ISOVUE-370) INJECTION 76%
INTRAVENOUS | Status: AC
  Filled 2016-01-05: qty 100

## 2016-01-05 MED ORDER — VERAPAMIL HCL 2.5 MG/ML IV SOLN
INTRAVENOUS | Status: AC
  Filled 2016-01-05: qty 2

## 2016-01-05 MED ORDER — IOPAMIDOL (ISOVUE-370) INJECTION 76%
INTRAVENOUS | Status: DC | PRN
  Administered 2016-01-05: 75 mL via INTRA_ARTERIAL

## 2016-01-05 SURGICAL SUPPLY — 13 items
CATH BALLN WEDGE 5F 110CM (CATHETERS) ×1 IMPLANT
CATH INFINITI 5 FR JL3.5 (CATHETERS) ×1 IMPLANT
CATH INFINITI 5FR AL1 (CATHETERS) ×1 IMPLANT
CATH INFINITI JR4 5F (CATHETERS) ×1 IMPLANT
DEVICE RAD COMP TR BAND LRG (VASCULAR PRODUCTS) ×1 IMPLANT
GLIDESHEATH SLEND SS 6F .021 (SHEATH) ×1 IMPLANT
KIT HEART LEFT (KITS) ×2 IMPLANT
PACK CARDIAC CATHETERIZATION (CUSTOM PROCEDURE TRAY) ×2 IMPLANT
SHEATH FAST CATH BRACH 5F 5CM (SHEATH) ×1 IMPLANT
TRANSDUCER W/STOPCOCK (MISCELLANEOUS) ×3 IMPLANT
TUBING CIL FLEX 10 FLL-RA (TUBING) ×2 IMPLANT
WIRE EMERALD ST .035X260CM (WIRE) ×1 IMPLANT
WIRE SAFE-T 1.5MM-J .035X260CM (WIRE) ×1 IMPLANT

## 2016-01-05 NOTE — H&P (View-Only) (Signed)
[address]       [place] [address]             [facility] REPORT  Referring Provider is [name], MD  Primary Cardiologist is [name], MD PCP is [name], MD  Chief Complaint  Patient presents with  . Aortic Stenosis    Surgical eval for possible AVR, TEE 11/05/15, Cardiac Cath PENDING    HPI:  Patient is a 75 year old obese white male with history of aortic stenosis, chronic persistent atrial fibrillation, chronic diastolic congestive heart failure, hypertension, type 2 diabetes mellitus with complications, and stage III chronic kidney disease Who has been referred for surgical consultation to discuss treatment options for management of severe symptomatic aortic stenosis. The patient has a long-standing history of chronic diastolic congestive heart failure and atrial fibrillation.  He has been followed for several years by Dr. Lovena Le. Transthoracic echocardiograms have suggested the presence of moderate aortic stenosis with preserved left ventricular systolic function. The patient also has been treated for atrial fibrillation which has failed medical therapy with Tikosyn.  He has been chronically anticoagulated using Eliquis but he recently suffered a retinal hemorrhage that required surgical intervention. The patient states that he has been instructed to resume taking Eliquis at the previous dose but he has decreased the dose on his own because of concerns regarding risk of bleeding. He was referred to Dr. [name] to consider placement of a Watchman left atrial occluder.  Concerns were raised regarding whether or not the patient's aortic stenosis might be severe. The patient subsequently underwent transesophageal echocardiogram which confirmed the presence of severe aortic stenosis. The patient was referred for surgical consultation to discuss treatment options further.  The patient is married and lives  locally with his wife in [place]. 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 He retired in 1989. He has been obese for much of his adult life. He does not exercise on a regular basis although he does enjoy gardening and yard work. He reports a long-standing history of exertional shortness of breath dating back at least 4 or 5 years. Symptoms have been slowly progressing.  He now gets short of breath with moderate level activity and this limits his physical activities to a significant degree. He gets short of breath just walking to the mailbox and back. He denies any history of resting shortness of breath, PND, orthopnea, dizzy spells, syncope, or lower extremity edema. He has never had any chest pain or chest tightness either with activity or at rest.  He does not experience palpitations and cannot tell when he is in sinus rhythm or atrial fibrillation.  His activity level is also notable for some problems with arthritis as well as poor balance. He has a chronic tremor particularly involving his right arm.  Past Medical History:  Diagnosis Date  . Aortic stenosis 12/2010  . Atrial fibrillation ([place])   . Exogenous obesity   . GERD (gastroesophageal reflux disease)    occasional  . History of chicken pox   . Hypertension   . Kidney stones remote  . Lyme disease 2012   ?titers negative  . Type 2 diabetes, controlled, with retinopathy ([place]) 1980s  . Urge incontinence   . Vitamin B12 deficiency    IF normal (2014)    Past Surgical History:  Procedure Laterality Date  . BACK SURGERY  20 yrs ago   removal of lower back cartilage due to damage. Patient does not remember date of surgery.  Marland Kitchen CARDIOVERSION N/A 02/25/2015   Procedure: CARDIOVERSION;  Surgeon: Skeet Latch, MD;  Location: [facility];  Service: Cardiovascular;  Laterality: N/A;  . CATARACT EXTRACTION Bilateral 2015   [facility]  . finger sx    .  kidney stone removal    . TEE WITHOUT CARDIOVERSION N/A 11/05/2015   Procedure: TRANSESOPHAGEAL ECHOCARDIOGRAM (TEE);  Surgeon: Pixie Casino, MD;  Location: [facility] ENDOSCOPY;  Service: Cardiovascular;  Laterality: N/A;  . TOTAL HIP ARTHROPLASTY  03/12/2012   RIGHT TOTAL HIP ARTHROPLASTY ANTERIOR APPROACH;  Surgeon: Mcarthur Rossetti, MD;  . US ECHOCARDIOGRAPHY  12/2010   EF 50-55%, mild LVH, normal wall motion, high LV filling pressures, mild AS, LA mildly dilated    Family History  Problem Relation Age of Onset  . Diabetes Father   . Diabetes Mother   . Heart disease Father   . Heart disease Mother   . Cancer Brother     throat    Social History   Social History  . Marital status: Married    Spouse name: N/A  . Number of children: N/A  . Years of education: N/A   Occupational History  . Not on file.   Social History Main Topics  . Smoking status: Never Smoker  . Smokeless tobacco: Never Used  . Alcohol use Yes     Comment: Regular (bourbon and coke 3-4/day)  . Drug use: No  . Sexual activity: No   Other Topics Concern  . Not on file   Social History Narrative   Caffeine: coffee 1 cup/day   Lives with wife [name]), 1 cat   Occupation: retired, worked for [employer]   Edu: BS   Activity: no regular exercise   Diet: some water, fruits/vegetables daily      Advanced directives: has living will at home. Does not want prolonged life support "no extraordinary measures" but would accept temporary measures. Would want wife to be HCPOA.     Current Outpatient Prescriptions  Medication Sig Dispense Refill  . apixaban (ELIQUIS) 5 MG TABS tablet Take 1 tablet (5 mg total) by mouth 2 (two) times daily. 60 tablet 3  . cholecalciferol (VITAMIN D) 1000 UNITS tablet Take 1,000 Units by mouth daily.    Marland Kitchen dofetilide (TIKOSYN) 250 MCG capsule Take 1 capsule (250 mcg total) by mouth 2 (two) times daily. 60 capsule 11  . furosemide (LASIX) 20 MG tablet Take 20 mg by mouth daily. Per  Patient, Dr. Lovena Le stated he could take 40 mg per day by mouth, but patient doesn't always take 40mg  by mouth daily    . glipiZIDE (GLUCOTROL) 10 MG tablet Take 5-10 mg by mouth 2 (two) times daily before a meal. Take 10 mg by mouth daily with breakfast, 5 mg daily with dinner    . glucose blood (ONE TOUCH ULTRA TEST) test strip Use to check sugar once daily and as needed. Dx:E11.319 **One Touch Ultra** 100 each 2  . ibuprofen (ADVIL,MOTRIN) 400 MG tablet Take 400 mg by mouth 2 (two) times daily as needed (pain).    . Magnesium Oxide 250 MG TABS Take 1 tablet by mouth daily.    . metFORMIN (GLUCOPHAGE) 500 MG tablet Take 500 mg by mouth 2 (two) times daily with a meal.     . Multiple Vitamin (MULTIVITAMIN WITH MINERALS) TABS tablet Take by mouth daily. 1 packet of  5 vitamins daily    . multivitamin-lutein (OCUVITE-LUTEIN) CAPS capsule Take 3 capsules by mouth daily.    Marland Kitchen oxybutynin (DITROPAN) 5 MG tablet Take 1 tablet (5 mg total) by mouth 3 (three) times daily. 270 tablet 3  . vitamin B-12 (CYANOCOBALAMIN) 1000 MCG tablet Take 1,000 mcg by mouth daily.     No current facility-administered medications for this visit.     No Known Allergies    Review of Systems:   General:  normal appetite, decreased energy, no weight gain, no weight loss, no fever  Cardiac:  no chest pain with exertion, no chest pain at rest, + SOB with exertion, no resting SOB, no PND, no orthopnea, no palpitations, + arrhythmia, + atrial fibrillation, no LE edema, no dizzy spells, no syncope  Respiratory:  + shortness of breath, no home oxygen, no productive cough, no dry cough, no bronchitis, no wheezing, no hemoptysis, no asthma, no pain with inspiration or cough, no sleep apnea, no CPAP at night  GI:   no difficulty swallowing, no reflux, no frequent heartburn, no hiatal hernia, no abdominal pain, + constipation, no diarrhea, no hematochezia, no hematemesis, no melena  GU:   no dysuria,  no frequency, no urinary tract  infection, no hematuria, no enlarged prostate, no kidney stones, + kidney disease  Vascular:  no pain suggestive of claudication, no pain in feet, no leg cramps, no varicose veins, no DVT, no non-healing foot ulcer  Neuro:   no stroke, no TIA's, no seizures, no headaches, + temporary blindness one eye,  no slurred speech, no peripheral neuropathy, no chronic pain, + instability of gait, no memory/cognitive dysfunction  Musculoskeletal: + arthritis, no joint swelling, no myalgias, mild difficulty walking, somewhat reduced mobility   Skin:   no rash, no itching, no skin infections, no pressure sores or ulcerations  Psych:   no anxiety, no depression, no nervousness, no unusual recent stress  Eyes:   + blurry vision, no floaters, + recent vision changes, no wears glasses or contacts  ENT:   no hearing loss, no loose or painful teeth, no dentures, last saw dentist 6/17  Hematologic:  + easy bruising, no abnormal bleeding, no clotting disorder, no frequent epistaxis  Endocrine:  + diabetes, does check CBG's at home     Physical Exam:   BP 140/78 (BP Location: Left Arm, Patient Position: Sitting, Cuff Size: Large)   Pulse 69   Resp 18   Ht 6\' 2"  (1.88 m)   Wt 254 lb (115.2 kg)   SpO2 97% Comment: RA  BMI 32.61 kg/m   General:  Obese,  well-appearing  HEENT:  Unremarkable   Neck:   no JVD, no bruits, no adenopathy   Chest:   clear to auscultation, symmetrical breath sounds, no wheezes, no rhonchi   CV:   Irregular rate and rhythm, grade III/VI crescendo/decrescendo systolic murmur   Abdomen:  soft, non-tender, no masses   Extremities:  warm, well-perfused, pulses diminished, no LE edema  Rectal/GU  Deferred  Neuro:   Grossly non-focal and symmetrical throughout  Skin:   Clean and dry, no rashes, no breakdown   Diagnostic Tests:  Transthoracic Echocardiography  (Report amended )  Patient:    Gwen, Stockert MR #:       [id number] Study Date: 10/21/2015 Gender:     M Age:         33 Height:     182.9 cm Weight:     122.8 kg BSA:  2.54 m^2 Pt. Status: Room:   ORDERING     Cristopher Peru, MD  REFERRING    Cristopher Peru, MD  ATTENDING    Fransico Him, MD  PERFORMING   [facility], Outpatient  SONOGRAPHER  [facility], RDCS  cc:  ------------------------------------------------------------------- LV EF: 55% -   60%  ------------------------------------------------------------------- Indications:      Aortic Stenosis (I35.9).  ------------------------------------------------------------------- History:   PMH:  ETOH Abuse, Obesity, Chronic Kidney Disease. Atrial fibrillation.  Congestive heart failure.  Risk factors: Family history of coronary artery disease. Hypertension. Diabetes mellitus. Dyslipidemia.  ------------------------------------------------------------------- Study Conclusions  - Left ventricle: The cavity size was normal. There was moderate   concentric hypertrophy. Systolic function was normal. The   estimated ejection fraction was in the range of 55% to 60%. Wall   motion was normal; there were no regional wall motion   abnormalities. - Aortic valve: Severe diffuse thickening and calcification. Valve   mobility was restricted. There was moderate stenosis. Mean   gradient (S): 25 mm Hg. Valve area (VTI): 1.18 cm^2. Valve area   (Vmax): 1.08 cm^2. Valve area (Vmean): 1.16 cm^2. - Mitral valve: Calcified annulus. Mild diffuse calcification.   There was mild regurgitation. - Right ventricle: The cavity size was severely dilated. Wall   thickness was normal. - Right atrium: The atrium was moderately dilated. - Tricuspid valve: There was trivial regurgitation.  Impressions:  - Normal LVF, moderate LVF, moderate to severe AS. Compared to   prior echo, AS as progressed.  Transthoracic echocardiography.  M-mode, complete 2D, spectral Doppler, and color Doppler.  Birthdate:  Patient birthdate: 03-07-1941.  Age:  Patient is 75  yr old.  Sex:  Gender: male. BMI: 36.7 kg/m^2.  Blood pressure:     130/84  Patient status: Outpatient.  Study date:  Study date: 10/21/2015. Study time: 08:08 AM.  Location:  [facility]  -------------------------------------------------------------------  ------------------------------------------------------------------- Left ventricle:  The cavity size was normal. There was moderate concentric hypertrophy. Systolic function was normal. The estimated ejection fraction was in the range of 55% to 60%. Wall motion was normal; there were no regional wall motion abnormalities.  ------------------------------------------------------------------- Aortic valve:   Probably trileaflet.  Severe diffuse thickening and calcification. Valve mobility was restricted.  Doppler:   There was moderate stenosis.   There was no regurgitation.    VTI ratio of LVOT to aortic valve: 0.24. Valve area (VTI): 1.18 cm^2. Indexed valve area (VTI): 0.46 cm^2/m^2. Peak velocity ratio of LVOT to aortic valve: 0.22. Valve area (Vmax): 1.08 cm^2. Indexed valve area (Vmax): 0.42 cm^2/m^2. Mean velocity ratio of LVOT to aortic valve: 0.24. Valve area (Vmean): 1.16 cm^2. Indexed valve area (Vmean): 0.46 cm^2/m^2.    Mean gradient (S): 25 mm Hg. Peak gradient (S): 47 mm Hg.  ------------------------------------------------------------------- Aorta:  Aortic root: The aortic root was normal in size.  ------------------------------------------------------------------- Mitral valve:   Calcified annulus.  Mild diffuse calcification. Mobility was not restricted.  Doppler:  Transvalvular velocity was within the normal range. There was no evidence for stenosis. There was mild regurgitation.  ------------------------------------------------------------------- Left atrium:  The atrium was normal in size.  ------------------------------------------------------------------- Right ventricle:  The cavity size was  severely dilated. Wall thickness was normal. Systolic function was normal.  ------------------------------------------------------------------- Pulmonic valve:    Structurally normal valve.   Cusp separation was normal.  Doppler:  Transvalvular velocity was within the normal range. There was no evidence for stenosis. There was no regurgitation.  ------------------------------------------------------------------- Tricuspid valve:   Structurally normal  valve.    Doppler: Transvalvular velocity was within the normal range. There was trivial regurgitation.  ------------------------------------------------------------------- Pulmonary artery:   The main pulmonary artery was normal-sized. Systolic pressure could not be accurately estimated.  ------------------------------------------------------------------- Right atrium:  The atrium was moderately dilated.  ------------------------------------------------------------------- Pericardium:  There was no pericardial effusion.  ------------------------------------------------------------------- Systemic veins: Inferior vena cava: The vessel was normal in size. The respirophasic diameter changes were in the normal range (= 50%), consistent with normal central venous pressure. Diameter: 18.8 mm.  ------------------------------------------------------------------- Measurements   IVC                                       Value          Reference  ID                                        18.8  mm       ---------    Left ventricle                            Value          Reference  LV ID, ED, PLAX chordal           (L)     41.6  mm       43 - 52  LV ID, ES, PLAX chordal                   24.4  mm       23 - 38  LV fx shortening, PLAX chordal            41    %        >=29  LV PW thickness, ED                       15.9  mm       ---------  IVS/LV PW ratio, ED                       1.05           <=1.3  Stroke volume, 2D                          98    ml       ---------  Stroke volume/bsa, 2D                     39    ml/m^2   ---------  LV e&', lateral                            9.36  cm/s     ---------  LV e&', medial                             15.1  cm/s     ---------  LV e&', average                            12.23 cm/s     ---------  Ventricular septum                        Value          Reference  IVS thickness, ED                         16.7  mm       ---------    LVOT                                      Value          Reference  LVOT ID, S                                25    mm       ---------  LVOT area                                 4.91  cm^2     ---------  LVOT peak velocity, S                     75.4  cm/s     ---------  LVOT mean velocity, S                     53    cm/s     ---------  LVOT VTI, S                               19.9  cm       ---------    Aortic valve                              Value          Reference  Aortic valve peak velocity, S             344   cm/s     ---------  Aortic valve mean velocity, S             224   cm/s     ---------  Aortic valve VTI, S                       82.8  cm       ---------  Aortic mean gradient, S                   25    mm Hg    ---------  Aortic peak gradient, S                   47    mm Hg    ---------  VTI ratio, LVOT/AV                        0.24           ---------  Aortic valve area, VTI                    1.18  cm^2     ---------  Aortic valve area/bsa, VTI                0.46  cm^2/m^2 ---------  Velocity ratio, peak, LVOT/AV             0.22           ---------  Aortic valve area, peak velocity          1.08  cm^2     ---------  Aortic valve area/bsa, peak               0.42  cm^2/m^2 ---------  velocity  Velocity ratio, mean, LVOT/AV             0.24           ---------  Aortic valve area, mean velocity          1.16  cm^2     ---------  Aortic valve area/bsa, mean               0.46  cm^2/m^2 ---------  velocity    Aorta                                      Value          Reference  Aortic root ID, ED                        36    mm       ---------  Ascending aorta ID, A-P, S                35    mm       ---------    Left atrium                               Value          Reference  LA ID, A-P, ES                            51    mm       ---------  LA ID/bsa, A-P                            2.01  cm/m^2   <=2.2  LA volume, ES, 1-p A4C                    109   ml       ---------  LA volume/bsa, ES, 1-p A4C                42.9  ml/m^2   ---------  LA volume, ES, 1-p A2C                    227   ml       ---------  LA volume/bsa, ES, 1-p A2C                89.3  ml/m^2   ---------    Tricuspid valve                           Value          Reference  Tricuspid regurg  peak velocity            272   cm/s     ---------  Tricuspid peak RV-RA gradient             30    mm Hg    ---------    Right ventricle                           Value          Reference  TAPSE                                     24    mm       ---------  Legend: (L)  and  (H)  mark values outside specified reference range.  ------------------------------------------------------------------- Michaelle Birks, MD 2017-06-01T09:38:21   Transesophageal Echocardiography  Patient:    Avyukth, Leyton MR #:       [id number] Study Date: 11/05/2015 Gender:     M Age:        40 Height:     188 cm Weight:     120.9 kg BSA:        2.55 m^2 Pt. Status: Room:   SONOGRAPHER  Tresa Res, RDCS  ADMITTING    Lyman Bishop MD  [place]beck MD  Texanna MD  Emelia Loron, Amber K  REFERRING    Lynnell Jude, Amber K  cc:  ------------------------------------------------------------------- LV EF: 60% -   65%  ------------------------------------------------------------------- Indications:      Atrial fibrillation -  427.31.  ------------------------------------------------------------------- Study Conclusions  - Left ventricle: There was mild concentric hypertrophy. Systolic   function was normal. The estimated ejection fraction was in the   range of 60% to 65%. Wall motion was normal; there were no   regional wall motion abnormalities. - Aortic valve: Heavily calcified with restricted leaflet motion.   There is severe aortic stenosis - AVA of 0.93 cm2 - mean gradient   of 43 mmHg and peak gradient of 71 mmHg. Valve area (VTI): 0.99   cm^2. Valve area (Vmax): 1.27 cm^2. Valve area (Vmean): 1.09   cm^2. - Aorta: Mild atheromatous disease. - Mitral valve: Mildly thickened leaflets . There was mild   regurgitation. - Left atrium: The atrium was dilated. No evidence of thrombus in   the atrial cavity or appendage. The appendage is windsock shaped. - Pulmonary veins: No anomaly. - Right atrium: No evidence of thrombus in the atrial cavity or   appendage. - Atrial septum: No defect or patent foramen ovale was identified.  Impressions:  - Severe aortic stenosis - AVA 0.93 cm2 with mean gradient of 43   mmHg. Maximal appendage measurement of 3.15 x 3.47 cm - windsock   appearance. While this may be suitable for Watchman, given the   severity of his aortic stenosis- evaluation for aortic valve   replacement +/- MAZE and LAA surgical closure is recommended.  Diagnostic transesophageal echocardiography.  2D and color Doppler.  Birthdate:  Patient birthdate: [birth year].  Age:  Patient is 75 yr old.  Sex:  Gender: male.    BMI: 34.2 kg/m^2.  Blood pressure: 126/82  Patient status:  Outpatient.  Study date:  Study date: 11/05/2015. Study time: 09:07 AM.  Location:  Endoscopy.  -------------------------------------------------------------------  ------------------------------------------------------------------- Left ventricle:  There was mild concentric hypertrophy. Systolic function was  normal. The estimated ejection fraction was in the range of 60% to 65%. Wall motion was normal; there were no regional wall motion abnormalities.  ------------------------------------------------------------------- Aortic valve:  Heavily calcified with restricted leaflet motion. There is severe aortic stenosis - AVA of 0.93 cm2 - mean gradient of 43 mmHg and peak gradient of 71 mmHg.  Doppler:     VTI ratio of LVOT to aortic valve: 0.19. Valve area (VTI): 0.99 cm^2. Indexed valve area (VTI): 0.39 cm^2/m^2. Peak velocity ratio of LVOT to aortic valve: 0.24. Valve area (Vmax): 1.27 cm^2. Indexed valve area (Vmax): 0.5 cm^2/m^2. Mean velocity ratio of LVOT to aortic valve: 0.21. Valve area (Vmean): 1.09 cm^2. Indexed valve area (Vmean): 0.43 cm^2/m^2.    Mean gradient (S): 40 mm Hg. Peak gradient (S): 65 mm Hg.  ------------------------------------------------------------------- Aorta:  Mild atheromatous disease.  ------------------------------------------------------------------- Mitral valve:   Mildly thickened leaflets .  Doppler:  There was mild regurgitation.  ------------------------------------------------------------------- Left atrium:  The atrium was dilated.  No evidence of thrombus in the atrial cavity or appendage. The maximal appendage measurement was 3.15 cm x 3.47 cm. The appendage is windsock shaped.  ------------------------------------------------------------------- Atrial septum:  No defect or patent foramen ovale was identified.   ------------------------------------------------------------------- Pulmonary veins:  No anomaly.  ------------------------------------------------------------------- Right ventricle:  The cavity size was normal. Wall thickness was normal. Systolic function was normal.  ------------------------------------------------------------------- Pulmonic valve:   Poorly  visualized.  ------------------------------------------------------------------- Tricuspid valve:   Doppler:  There was trivial regurgitation.   ------------------------------------------------------------------- Right atrium:  The atrium was normal in size.  No evidence of thrombus in the atrial cavity or appendage.  ------------------------------------------------------------------- Pericardium:  There was no pericardial effusion.   ------------------------------------------------------------------- Post procedure conclusions Ascending Aorta:  - Mild atheromatous disease.  ------------------------------------------------------------------- Measurements   Left ventricle                           Value  Stroke volume, 2D                        98    ml  Stroke volume/bsa, 2D                    38    ml/m^2    LVOT                                     Value  LVOT ID, S                               26    mm  LVOT area                                5.31  cm^2  LVOT peak velocity, S                    96.6  cm/s  LVOT mean velocity, S                    59.8  cm/s  LVOT VTI, S  18.4  cm    Aortic valve                             Value  Aortic valve peak velocity, S            403   cm/s  Aortic valve mean velocity, S            291   cm/s  Aortic valve VTI, S                      98.8  cm  Aortic mean gradient, S                  40    mm Hg  Aortic peak gradient, S                  65    mm Hg  VTI ratio, LVOT/AV                       0.19  Aortic valve area, VTI                   0.99  cm^2  Aortic valve area/bsa, VTI               0.39  cm^2/m^2  Velocity ratio, peak, LVOT/AV            0.24  Aortic valve area, peak velocity         1.27  cm^2  Aortic valve area/bsa, peak velocity     0.5   cm^2/m^2  Velocity ratio, mean, LVOT/AV            0.21  Aortic valve area, mean velocity         1.09  cm^2  Aortic valve area/bsa, mean velocity      0.43  cm^2/m^2  Legend: (L)  and  (H)  mark values outside specified reference range.  ------------------------------------------------------------------- Prepared and Electronically Authenticated by  Lyman Bishop MD 2017-06-16T17:29:07   STS Risk Calculator  Procedure    AVR  Risk of Mortality   2.9% Morbidity or Mortality  20.9% Prolonged LOS   8.0% Short LOS    30.9% Permanent Stroke   1.4% Prolonged Vent Support  12.5% DSW Infection    0.5% Renal Failure    8.9% Reoperation    7.3%    Impression:  Patient has stage D severe symptomatic aortic stenosis. He presents with a long history of progressive symptoms of exertional shortness of breath and fatigue consistent with chronic diastolic congestive heart failure, New York Heart Association function class IIb. I have personally reviewed the patient's recent transthoracic and transesophageal echocardiograms. The patient's aortic valve is tricuspid with moderate to severe thickening, calcification, and restricted leaflet mobility involving all 3 leaflets.  Previous transthoracic echocardiograms have demonstrated transvalvular gradients consistent with moderate disease, but recent transesophageal echocardiogram confirms that the patient really has severe aortic stenosis.  Peak velocity across the aortic valve measured greater than 4 m/s corresponding to mean transvalvular gradient estimated 48 mmHg.  Left ventricular systolic function remains preserved.  Risks associated with conventional surgical aortic valve replacement will be reasonably low but somewhat elevated because of the patient's numerous comorbid medical problems.  Given the patient's history of long-standing atrial fibrillation and problems with taking long-term anticoagulation, he would certainly benefit from obliteration of his left atrial appendage and he  might do well following Maze procedure.   Plan:  The patient and his wife were counseled at length regarding  treatment alternatives for management of severe symptomatic aortic stenosis.  The natural history of aortic stenosis and long-term prognosis with medical therapy were discussed.  Alternative approaches such as conventional surgical aortic valve replacement with or without maze procedure, transcatheter aortic valve replacement with or without Watchman left atrial occluder placement, and palliative medical therapy were compared and contrasted at length. This discussion was placed in the context of the patient's own specific clinical presentation and past medical history.  All of their questions been addressed.  The patient is interested in proceeding with conventional surgical aortic valve replacement and Maze procedure in the near future. As a next step he will undergo noncontrast CT scan of the chest to evaluate his ascending aorta. CT angiography will not be performed because of the patient's underlying chronic kidney disease. Assuming that the patient does not have significant aortic calcification, then he will need to undergo left and right heart catheterization.  The patient also will be referred for formal physical therapy evaluation to evaluate concerns regarding his instability of gait and the possibility that he may need formal physical therapy during his postoperative convalescence. The patient will return to our office in 2 weeks to review the results of his CT scan, cath, and physical therapy consultation. All of his questions have been addressed.    I spent in excess of 90 minutes during the conduct of this office consultation and >50% of this time involved direct face-to-face encounter with the patient for counseling and/or coordination of their care.    Valentina Gu. Roxy Manns, MD 12/27/2015 5:36 PM

## 2016-01-05 NOTE — Discharge Instructions (Signed)
Resume Eliquis tomorrow morning (01/06/16) if no bleeding from cath sites in right arm.    Radial Site Care Refer to this sheet in the next few weeks. These instructions provide you with information about caring for yourself after your procedure. Your health care provider may also give you more specific instructions. Your treatment has been planned according to current medical practices, but problems sometimes occur. Call your health care provider if you have any problems or questions after your procedure. WHAT TO EXPECT AFTER THE PROCEDURE After your procedure, it is typical to have the following:  Bruising at the radial site that usually fades within 1-2 weeks.  Blood collecting in the tissue (hematoma) that may be painful to the touch. It should usually decrease in size and tenderness within 1-2 weeks. HOME CARE INSTRUCTIONS  Take medicines only as directed by your health care provider.  You may shower 24-48 hours after the procedure or as directed by your health care provider. Remove the bandage (dressing) and gently wash the site with plain soap and water. Pat the area dry with a clean towel. Do not rub the site, because this may cause bleeding.  Do not take baths, swim, or use a hot tub until your health care provider approves.  Check your insertion site every day for redness, swelling, or drainage.  Do not apply powder or lotion to the site.  Do not flex or bend the affected arm for 24 hours or as directed by your health care provider.  Do not push or pull heavy objects with the affected arm for 24 hours or as directed by your health care provider.  Do not lift over 10 lb (4.5 kg) for 5 days after your procedure or as directed by your health care provider.  Ask your health care provider when it is okay to:  Return to work or school.  Resume usual physical activities or sports.  Resume sexual activity.  Do not drive home if you are discharged the same day as the procedure.  Have someone else drive you.  You may drive 24 hours after the procedure unless otherwise instructed by your health care provider.  Do not operate machinery or power tools for 24 hours after the procedure.  If your procedure was done as an outpatient procedure, which means that you went home the same day as your procedure, a responsible adult should be with you for the first 24 hours after you arrive home.  Keep all follow-up visits as directed by your health care provider. This is important. SEEK MEDICAL CARE IF:  You have a fever.  You have chills.  You have increased bleeding from the radial site. Hold pressure on the site. CALL 911 SEEK IMMEDIATE MEDICAL CARE IF:  You have unusual pain at the radial site.  You have redness, warmth, or swelling at the radial site.  You have drainage (other than a small amount of blood on the dressing) from the radial site.  The radial site is bleeding, and the bleeding does not stop after 30 minutes of holding steady pressure on the site.  Your arm or hand becomes pale, cool, tingly, or numb.   This information is not intended to replace advice given to you by your health care provider. Make sure you discuss any questions you have with your health care provider.   Document Released: 06/10/2010 Document Revised: 05/29/2014 Document Reviewed: 11/24/2013 Elsevier Interactive Patient Education [employer].

## 2016-01-05 NOTE — Interval H&P Note (Signed)
History and Physical Interval Note:  01/05/2016 11:33 AM  [name]  has presented today for cardiac cath with the diagnosis of severe aortic stenosis  The various methods of treatment have been discussed with the patient and family. After consideration of risks, benefits and other options for treatment, the patient has consented to  Procedure(s): Right/Left Heart Cath and Coronary Angiography (N/A) as a surgical intervention .  The patient's history has been reviewed, patient examined, no change in status, stable for surgery.  I have reviewed the patient's chart and labs.  Questions were answered to the patient's satisfaction.    Cath Lab Visit (complete for each Cath Lab visit)  Clinical Evaluation Leading to the Procedure:   ACS: No.  Non-ACS:    Anginal Classification: CCS II  Anti-ischemic medical therapy: No Therapy  Non-Invasive Test Results: No non-invasive testing performed  Prior CABG: No previous CABG         [name]

## 2016-01-05 NOTE — Progress Notes (Signed)
Site area: right brachial vein 5 fr sheath Site Prior to Removal:  Level 0 Pressure Applied For:15 min Manual:   yes Patient Status During Pull:  VSS/ no complaint Post Pull Site:  Level 0 Post Pull Instructions Given:  Yes  Post Pull Pulses Present: yes rad 3+ Dressing Applied:  Gauze and pressure dressing Bedrest begins @ na Comments: no complaints

## 2016-01-06 ENCOUNTER — Encounter (HOSPITAL_COMMUNITY): Payer: Self-pay | Admitting: Cardiovascular Disease

## 2016-01-10 ENCOUNTER — Other Ambulatory Visit: Payer: Self-pay | Admitting: *Deleted

## 2016-01-10 ENCOUNTER — Encounter: Payer: Self-pay | Admitting: Thoracic Surgery (Cardiothoracic Vascular Surgery)

## 2016-01-10 ENCOUNTER — Ambulatory Visit (INDEPENDENT_AMBULATORY_CARE_PROVIDER_SITE_OTHER): Payer: PPO | Admitting: Thoracic Surgery (Cardiothoracic Vascular Surgery)

## 2016-01-10 VITALS — BP 135/72 | HR 70 | Resp 16 | Ht 74.0 in | Wt 254.0 lb

## 2016-01-10 DIAGNOSIS — I482 Chronic atrial fibrillation, unspecified: Secondary | ICD-10-CM

## 2016-01-10 DIAGNOSIS — I4891 Unspecified atrial fibrillation: Secondary | ICD-10-CM

## 2016-01-10 DIAGNOSIS — I350 Nonrheumatic aortic (valve) stenosis: Secondary | ICD-10-CM

## 2016-01-10 NOTE — Progress Notes (Signed)
[address]       [place] [address]             [facility] OFFICE NOTE  Referring Provider is [name], MD  Primary Cardiologist is [name], MD PCP is [name], MD   HPI:  Patient returns to the office today for follow-up of severe symptomatic aortic stenosis and atrial fibrillation. He was originally seen in consultation on 12/27/2015. Since then the patient underwent CT scan of the chest, cardiac catheterization, and a formal physical therapy evaluation. He returns to our office today to review the results of these tests and discuss treatment options further. He reports no new problems or complaints since his last visit.   Current Outpatient Prescriptions  Medication Sig Dispense Refill  . apixaban (ELIQUIS) 5 MG TABS tablet Take 1 tablet (5 mg total) by mouth 2 (two) times daily. 60 tablet 3  . cholecalciferol (VITAMIN D) 1000 UNITS tablet Take 1,000 Units by mouth daily.    Marland Kitchen dofetilide (TIKOSYN) 250 MCG capsule Take 1 capsule (250 mcg total) by mouth 2 (two) times daily. 60 capsule 11  . furosemide (LASIX) 20 MG tablet Take 20 mg by mouth daily. Per Patient, Dr. Lovena Le stated he could take 40 mg per day by mouth, but patient doesn't always take 40mg  by mouth daily    . glipiZIDE (GLUCOTROL) 10 MG tablet Take 5-10 mg by mouth 2 (two) times daily before a meal. Take 10 mg by mouth daily with breakfast, 5 mg daily with dinner    . glucose blood (ONE TOUCH ULTRA TEST) test strip Use to check sugar once daily and as needed. Dx:E11.319 **One Touch Ultra** 100 each 2  . ibuprofen (ADVIL,MOTRIN) 400 MG tablet Take 400 mg by mouth 2 (two) times daily as needed (pain).    . Magnesium Oxide 250 MG TABS Take 1 tablet by mouth daily as needed (supplement).     . metFORMIN (GLUCOPHAGE) 500 MG tablet Take 500 mg by mouth 2 (two) times daily with a meal.     . Multiple Vitamin (MULTIVITAMIN WITH MINERALS) TABS tablet Take by  mouth daily. 1 packet of 5 vitamins daily    . multivitamin-lutein (OCUVITE-LUTEIN) CAPS capsule Take 3 capsules by mouth daily.    Marland Kitchen oxybutynin (DITROPAN) 5 MG tablet Take 1 tablet (5 mg total) by mouth 3 (three) times daily. 270 tablet 3  . vitamin B-12 (CYANOCOBALAMIN) 1000 MCG tablet Take 1,000 mcg by mouth daily.     No current facility-administered medications for this visit.       Physical Exam:   BP 135/72   Pulse 70   Resp 16   Ht 6\' 2"  (1.88 m)   Wt 254 lb (115.2 kg)   SpO2 95% Comment: ON RA  BMI 32.61 kg/m   General:  Obese but well appearing  Chest:   Clear to auscultation  CV:   Irregular rate and rhythm with prominent systolic murmur  Incisions:  n/a  Abdomen:  Soft and nontender  Extremities:  Warm and well-perfused  Diagnostic Tests:  CT CHEST WITHOUT CONTRAST  TECHNIQUE: Multidetector CT imaging of the chest was performed following the standard protocol without IV contrast.  COMPARISON:  03/20/2011  FINDINGS: Cardiovascular: The aortic valve is heavily calcified. The thoracic aorta it does not contain significant plaque with a mild amount of plaque present at the level of the arch. There is no evidence of thoracic  aortic aneurysmal disease. The ascending thoracic aorta measures 3.5 cm in greatest diameter the proximal arch measures 3.2 cm. The distal arch measures 3.3 cm. The descending thoracic aorta measures 3 cm.  There is evidence of coronary atherosclerosis with heavily calcified plaque in the distributions of the LAD, left circumflex and right coronary arteries. The PDA is also calcified at the base of the heart. The heart size is normal. No pericardial fluid identified.  Mediastinum/Nodes: No evidence of mediastinal masses or lymphadenopathy.  Lungs/Pleura: Mild pulmonary parenchymal scarring at the left lung base. There is no evidence of pulmonary edema, consolidation, pneumothorax, nodule or pleural fluid.  Upper Abdomen:  Visualized upper abdominal structures demonstrate multiple calcified gallstones within the visualize gallbladder. The pancreas is atrophic and infiltrated by fat.  Musculoskeletal: Bony structures show mild spondylosis throughout the thoracic spine.  IMPRESSION: 1. Heavily calcified aortic valve. No evidence of thoracic aortic aneurysmal disease or significant aortic calcification. 2. Coronary atherosclerosis with heavily calcified plaque visualized by CT in a 3 vessel distribution. 3. No acute findings in the chest or incidental masses. 4. Cholelithiasis with multiple gallstones visualized in the gallbladder lumen. 5. Atrophy and fatty infiltration of the pancreas.   Electronically Signed   By: Aletta Edouard M.D.   On: 12/31/2015 17:07    Right/Left Heart Cath and Coronary Angiography   Conclusion     RPDA lesion, 20 %stenosed.  Mid RCA to Dist RCA lesion, 30 %stenosed.  Dist RCA lesion, 20 %stenosed.  Prox LAD to Mid LAD lesion, 20 %stenosed.  Mid LAD lesion, 30 %stenosed.  Dist LAD lesion, 20 %stenosed.  There is severe aortic valve stenosis.   1. Mild non-obstructive CAD 2. Hazy non-flow limiting mid LAD lesion which has the appearance of an eccentric calcified plaque, best seen in the cranial views. This is not seen in the caudal views. Cannot exclude eccentric thrombus but low suspicion for intra-coronary thrombus given the clinical scenario.  3. Severe aortic valve stenosis (peak to peak gradient visually is 40 mm Hg. The gradient reported does not account for catheter whip and variation seen in the waveforms during pullback. No AVA is reported due to this inaccurate data. I did not recross the aortic valve to remeasure).    Recommendations: Will continue planning for AVR, MAZE.    Indications   Severe aortic stenosis [I35.0 (ICD-10-CM)]  Procedural Details/Technique   Technical Details Indication: 75 yo male with atrial fibrillation, severe  aortic stenosis who is undergoing planning for surgical AVR, MAZE and left atrial appendage ligation.   Procedure: The risks, benefits, complications, treatment options, and expected outcomes were discussed with the patient. The patient and/or family concurred with the proposed plan, giving informed consent. The patient was brought to the cath lab after IV hydration was begun and oral premedication was given. The patient was further sedated with Versed and Fentanyl. There was an IV catheter present in the right antecubital vein. This area was prepped and draped. I changed out this catheter for a 5 French sheath. Right heart catheterization performed with a balloon tipped catheter. The right wrist was prepped and draped in a sterile fashion. 1% lidocaine was used for local anesthesia. Using the modified Seldinger access technique, a 5 French sheath was placed in the right radial artery. 3 mg Verapamil was given through the sheath. 6000 units IV heparin was given. Standard diagnostic catheters were used to perform selective coronary angiography. I crossed the aortic valve with an AL-1 catheter and a straight wire. LV  pressures measured. No LV gram performed. The sheath was removed from the right radial artery and a Terumo hemostasis band was applied at the arteriotomy site on the right wrist.     Estimated blood loss <50 mL. . During this procedure the patient was administered the following to achieve and maintain moderate conscious sedation: Versed 1 mg, Fentanyl 25 mcg, while the patient's heart rate, blood pressure, and oxygen saturation were continuously monitored. The period of conscious sedation was 40 minutes, of which I was present face-to-face 100% of this time.    Complications   Complications documented before study signed (01/05/2016 2:16 PM EDT)    RIGHT/LEFT HEART CATH AND CORONARY ANGIOGRAPHY   None Documented by Burnell Blanks, MD 01/05/2016 1:12 PM EDT  Time Range:  Intra-procedure      Coronary Findings   Dominance: Right  Left Anterior Descending  Prox LAD to Mid LAD lesion, 20% stenosed.  Mid LAD lesion, 30% stenosed. The lesion is calcified. There is filling defect noted in the mid LAD just after the takeoff the takeoff of the diagonal branch. This appears to be an eccentric calcified plaque. There is no flow limitation.  Dist LAD lesion, 20% stenosed.  Second Diagonal Branch  Vessel is moderate in size.  Left Circumflex  Vessel is moderate in size.  First Obtuse Marginal Branch  Vessel is moderate in size.  Second Obtuse Marginal Branch  Vessel is moderate in size.  Right Coronary Artery  Vessel is large.  Mid RCA to Dist RCA lesion, 30% stenosed. The lesion is discrete.  Dist RCA lesion, 20% stenosed. The lesion is discrete.  Right Posterior Descending Artery  Vessel is moderate in size.  RPDA lesion, 20% stenosed.  Left Heart   Aortic Valve There is severe aortic valve stenosis. The aortic valve is calcified.    Coronary Diagrams   Diagnostic Diagram     Implants     No implant documentation for this case.  PACS Images   Show images for Cardiac catheterization   Link to Procedure Log   Procedure Log    Hemo Data   Flowsheet Row Most Recent Value  Aortic Mean Gradient 20.3 mmHg  Aortic Peak Gradient 23 mmHg  RA A Wave -99 mmHg  RA V Wave 5 mmHg  RA Mean 3 mmHg  RV Systolic Pressure 31 mmHg  RV Diastolic Pressure -1 mmHg  RV EDP 3 mmHg  PA Systolic Pressure 29 mmHg  PA Diastolic Pressure 12 mmHg  PA Mean 18 mmHg  PW A Wave -99 mmHg  PW V Wave 16 mmHg  PW Mean 11 mmHg  AO Systolic Pressure [id number] mmHg  AO Diastolic Pressure 66 mmHg  AO Mean 95 mmHg  LV Systolic Pressure [id number] mmHg  LV Diastolic Pressure 4 mmHg  LV EDP 15 mmHg  Arterial Occlusion Pressure Extended Systolic Pressure 0000000 mmHg  Arterial Occlusion Pressure Extended Diastolic Pressure 72 mmHg  Arterial Occlusion Pressure Extended Mean Pressure 106  mmHg  Left Ventricular Apex Extended Systolic Pressure [id number] mmHg  Left Ventricular Apex Extended Diastolic Pressure 3 mmHg  Left Ventricular Apex Extended EDP Pressure 12 mmHg      Impression:  Patient has stage D severe symptomatic aortic stenosis. He presents with a long history of progressive symptoms of exertional shortness of breath and fatigue consistent with chronic diastolic congestive heart failure, New York Heart Association function class IIb. I have personally reviewed the patient's recent transthoracic and transesophageal echocardiograms, CT scan and cardiac catheterization. The  patient's aortic valve is tricuspid with moderate to severe thickening, calcification, and restricted leaflet mobility involving all 3 leaflets.  Previous transthoracic echocardiograms have demonstrated transvalvular gradients consistent with moderate disease, but recent transesophageal echocardiogram confirms that the patient really has severe aortic stenosis.  Peak velocity across the aortic valve measured greater than 4 m/s corresponding to mean transvalvular gradient estimated 48 mmHg.  Left ventricular systolic function remains preserved.  Diagnostic cardiac catheterization is notable for the presence of moderate nonobstructive coronary artery disease. Peak to peak gradient across the aortic valve was estimated 40 mmHg. CT scan of the chest reveals normal-sized ascending thoracic aorta with no significant calcification in the aortic root proximal aorta.  Risks associated with conventional surgical aortic valve replacement will be reasonably low but somewhat elevated because of the patient's numerous comorbid medical problems.   Given the patient's history of long-standing atrial fibrillation and problems with taking long-term anticoagulation, he would certainly benefit from obliteration of his left atrial appendage and he might do well following Maze procedure.  The patient did reasonably well with his physical  therapy evaluation, although it seems likely he will likely need home health physical therapy during his early postoperative recovery.    Plan:  The patient and his wife were again counseled at length regarding treatment alternatives for management of severe aortic stenosis including continued medical therapy versus proceeding with aortic valve replacement in the near future.  The natural history of aortic stenosis was reviewed, as was long term prognosis with medical therapy alone.  Surgical options were discussed at length including conventional surgical aortic valve replacement through either a full median sternotomy or using minimally invasive techniques.  Other alternatives including rapid-deployment bioprosthetic tissue valve replacement, transcatheter aortic valve replacement, patch enlargement of the aortic root, stentless porcine aortic root replacement, valve repair, the Ross autograft procedure, and homograft aortic root replacement were discussed.  Discussion was held comparing the relative risks of mechanical valve replacement with need for lifelong anticoagulation versus use of a bioprosthetic tissue valve and the associated potential for late structural valve deterioration and failure.  This discussion was placed in the context of the patient's particular circumstances, and as a result the patient specifically requests that their valve be replaced using a bioprosthetic tissue valve .  The patient understands and accepts all potential associated risks of surgery including but not limited to risk of death, stroke, myocardial infarction, congestive heart failure, respiratory failure, renal failure, pneumonia, bleeding requiring blood transfusion and or reexploration, arrhythmia, heart block or bradycardia requiring permanent pacemaker, aortic dissection or other major vascular complication, pleural effusions or other delayed complications related to continued congestive heart failure, and other  late complications related to valve replacement including structural valve deterioration and failure, thrombosis, endocarditis, or paravalvular leak.  The relative risks and benefits of performing a maze procedure with clipping of the LA appendage at the time of their surgery was discussed at length, including the expected likelihood of long term freedom from recurrent symptomatic atrial fibrillation and/or atrial flutter.   We plan to proceed with surgery on Thursday, 01/27/2016. The patient has been instructed to stop taking Eliquis 7 days prior to surgery. All of his questions have been addressed.    I spent in excess of 15 minutes during the conduct of this office consultation and >50% of this time involved direct face-to-face encounter with the patient for counseling and/or coordination of their care.   Valentina Gu. Roxy Manns, MD 01/10/2016 11:56 AM

## 2016-01-10 NOTE — Patient Instructions (Signed)
Patient has been instructed to stop taking Eliquis on August 31st  Patient should continue taking all other medications without change through the day before surgery.  Patient should have nothing to eat or drink after midnight the night before surgery.  On the morning of surgery patient does not need to take any of his regular medications

## 2016-01-11 ENCOUNTER — Telehealth: Payer: Self-pay | Admitting: Cardiovascular Disease

## 2016-01-11 ENCOUNTER — Other Ambulatory Visit: Payer: Self-pay | Admitting: *Deleted

## 2016-01-11 DIAGNOSIS — I482 Chronic atrial fibrillation, unspecified: Secondary | ICD-10-CM

## 2016-01-11 MED ORDER — APIXABAN 5 MG PO TABS: 5.0000 mg | ORAL_TABLET | Freq: Two times a day (BID) | ORAL | 1 refills | Status: DC

## 2016-01-11 NOTE — Telephone Encounter (Signed)
Nunzio Cobbs is calling from [employer] is asking if they can do a 90 day supply instesd of 30 day on [name] . Please call at [phone])

## 2016-01-19 DIAGNOSIS — H40023 Open angle with borderline findings, high risk, bilateral: Secondary | ICD-10-CM | POA: Diagnosis not present

## 2016-01-25 ENCOUNTER — Encounter (HOSPITAL_COMMUNITY)
Admission: RE | Admit: 2016-01-25 | Discharge: 2016-01-25 | Disposition: A | Discharge status: Home / Self Care | Payer: PPO | Source: Ambulatory Visit | Attending: Thoracic Surgery (Cardiothoracic Vascular Surgery) | Admitting: Thoracic Surgery (Cardiothoracic Vascular Surgery)

## 2016-01-25 ENCOUNTER — Ambulatory Visit (HOSPITAL_COMMUNITY)
Admission: RE | Admit: 2016-01-25 | Discharge: 2016-01-25 | Disposition: A | Discharge status: Home / Self Care | Payer: PPO | Source: Ambulatory Visit | Attending: Thoracic Surgery (Cardiothoracic Vascular Surgery) | Admitting: Thoracic Surgery (Cardiothoracic Vascular Surgery)

## 2016-01-25 ENCOUNTER — Other Ambulatory Visit: Payer: Self-pay | Admitting: *Deleted

## 2016-01-25 ENCOUNTER — Ambulatory Visit (HOSPITAL_BASED_OUTPATIENT_CLINIC_OR_DEPARTMENT_OTHER)
Admission: RE | Admit: 2016-01-25 | Discharge: 2016-01-25 | Disposition: A | Discharge status: Home / Self Care | Payer: PPO | Source: Ambulatory Visit | Attending: Thoracic Surgery (Cardiothoracic Vascular Surgery) | Admitting: Thoracic Surgery (Cardiothoracic Vascular Surgery)

## 2016-01-25 ENCOUNTER — Encounter (HOSPITAL_COMMUNITY): Payer: Self-pay

## 2016-01-25 DIAGNOSIS — Z7901 Long term (current) use of anticoagulants: Secondary | ICD-10-CM | POA: Insufficient documentation

## 2016-01-25 DIAGNOSIS — Z7984 Long term (current) use of oral hypoglycemic drugs: Secondary | ICD-10-CM

## 2016-01-25 DIAGNOSIS — I350 Nonrheumatic aortic (valve) stenosis: Secondary | ICD-10-CM | POA: Diagnosis not present

## 2016-01-25 DIAGNOSIS — Z01812 Encounter for preprocedural laboratory examination: Secondary | ICD-10-CM

## 2016-01-25 DIAGNOSIS — I080 Rheumatic disorders of both mitral and aortic valves: Secondary | ICD-10-CM | POA: Diagnosis not present

## 2016-01-25 DIAGNOSIS — Z01818 Encounter for other preprocedural examination: Secondary | ICD-10-CM

## 2016-01-25 DIAGNOSIS — Z0183 Encounter for blood typing: Secondary | ICD-10-CM | POA: Insufficient documentation

## 2016-01-25 DIAGNOSIS — Z79899 Other long term (current) drug therapy: Secondary | ICD-10-CM

## 2016-01-25 DIAGNOSIS — J9811 Atelectasis: Secondary | ICD-10-CM | POA: Diagnosis not present

## 2016-01-25 DIAGNOSIS — D6959 Other secondary thrombocytopenia: Secondary | ICD-10-CM | POA: Diagnosis not present

## 2016-01-25 DIAGNOSIS — I700 Atherosclerosis of aorta: Secondary | ICD-10-CM | POA: Insufficient documentation

## 2016-01-25 DIAGNOSIS — E1142 Type 2 diabetes mellitus with diabetic polyneuropathy: Secondary | ICD-10-CM | POA: Diagnosis not present

## 2016-01-25 DIAGNOSIS — I4891 Unspecified atrial fibrillation: Secondary | ICD-10-CM | POA: Diagnosis not present

## 2016-01-25 DIAGNOSIS — E1122 Type 2 diabetes mellitus with diabetic chronic kidney disease: Secondary | ICD-10-CM | POA: Diagnosis not present

## 2016-01-25 DIAGNOSIS — D62 Acute posthemorrhagic anemia: Secondary | ICD-10-CM | POA: Diagnosis not present

## 2016-01-25 DIAGNOSIS — N179 Acute kidney failure, unspecified: Secondary | ICD-10-CM | POA: Diagnosis not present

## 2016-01-25 DIAGNOSIS — I130 Hypertensive heart and chronic kidney disease with heart failure and stage 1 through stage 4 chronic kidney disease, or unspecified chronic kidney disease: Secondary | ICD-10-CM | POA: Diagnosis not present

## 2016-01-25 DIAGNOSIS — R918 Other nonspecific abnormal finding of lung field: Secondary | ICD-10-CM | POA: Insufficient documentation

## 2016-01-25 DIAGNOSIS — N183 Chronic kidney disease, stage 3 (moderate): Secondary | ICD-10-CM | POA: Diagnosis not present

## 2016-01-25 DIAGNOSIS — Q211 Atrial septal defect: Secondary | ICD-10-CM | POA: Diagnosis not present

## 2016-01-25 DIAGNOSIS — I5032 Chronic diastolic (congestive) heart failure: Secondary | ICD-10-CM | POA: Diagnosis not present

## 2016-01-25 DIAGNOSIS — I481 Persistent atrial fibrillation: Secondary | ICD-10-CM | POA: Diagnosis not present

## 2016-01-25 HISTORY — DX: Myoneural disorder, unspecified: G70.9

## 2016-01-25 HISTORY — DX: Atherosclerotic heart disease of native coronary artery without angina pectoris: I25.10

## 2016-01-25 LAB — BLOOD GAS, ARTERIAL
Acid-base deficit: 1.6 mmol/L (ref 0.0–2.0)
Bicarbonate: 21.9 mmol/L (ref 20.0–28.0)
FIO2: 21
O2 Saturation: 94 %
PCO2 ART: 32.2 mmHg (ref 32.0–48.0)
PH ART: 7.447 (ref 7.350–7.450)
PO2 ART: 68.4 mmHg — AB (ref 83.0–108.0)
Patient temperature: 98.6

## 2016-01-25 LAB — PROTIME-INR
INR: 1.06
Prothrombin Time: 13.8 seconds (ref 11.4–15.2)

## 2016-01-25 LAB — PULMONARY FUNCTION TEST
DL/VA % PRED: 83 %
DL/VA: 4.03 ml/min/mmHg/L
DLCO UNC % PRED: 60 %
DLCO UNC: 22.82 ml/min/mmHg
FEF 25-75 POST: 3.59 L/s
FEF 25-75 PRE: 1.82 L/s
FEF2575-%Change-Post: 97 %
FEF2575-%PRED-POST: 143 %
FEF2575-%PRED-PRE: 72 %
FEV1-%CHANGE-POST: 19 %
FEV1-%PRED-PRE: 71 %
FEV1-%Pred-Post: 84 %
FEV1-Post: 2.97 L
FEV1-Pre: 2.49 L
FEV1FVC-%Change-Post: 4 %
FEV1FVC-%PRED-PRE: 101 %
FEV6-%Change-Post: 14 %
FEV6-%PRED-POST: 84 %
FEV6-%Pred-Pre: 73 %
FEV6-POST: 3.85 L
FEV6-Pre: 3.36 L
FEV6FVC-%CHANGE-POST: 0 %
FEV6FVC-%PRED-POST: 105 %
FEV6FVC-%Pred-Pre: 105 %
FVC-%Change-Post: 14 %
FVC-%PRED-POST: 80 %
FVC-%Pred-Pre: 70 %
FVC-PRE: 3.41 L
FVC-Post: 3.9 L
POST FEV1/FVC RATIO: 76 %
PRE FEV1/FVC RATIO: 73 %
Post FEV6/FVC ratio: 99 %
Pre FEV6/FVC Ratio: 99 %
RV % pred: 109 %
RV: 3.08 L
TLC % PRED: 86 %
TLC: 6.78 L

## 2016-01-25 LAB — URINALYSIS, ROUTINE W REFLEX MICROSCOPIC
Bilirubin Urine: NEGATIVE
GLUCOSE, UA: NEGATIVE mg/dL
Hgb urine dipstick: NEGATIVE
Ketones, ur: NEGATIVE mg/dL
LEUKOCYTES UA: NEGATIVE
Nitrite: NEGATIVE
Protein, ur: NEGATIVE mg/dL
Specific Gravity, Urine: 1.015 (ref 1.005–1.030)
pH: 5.5 (ref 5.0–8.0)

## 2016-01-25 LAB — ABO/RH: ABO/RH(D): A POS

## 2016-01-25 LAB — SURGICAL PCR SCREEN
MRSA, PCR: NEGATIVE
STAPHYLOCOCCUS AUREUS: NEGATIVE

## 2016-01-25 LAB — COMPREHENSIVE METABOLIC PANEL
ALK PHOS: 42 U/L (ref 38–126)
ALT: 21 U/L (ref 17–63)
AST: 27 U/L (ref 15–41)
Albumin: 3.9 g/dL (ref 3.5–5.0)
Anion gap: 8 (ref 5–15)
BILIRUBIN TOTAL: 1.1 mg/dL (ref 0.3–1.2)
BUN: 17 mg/dL (ref 6–20)
CALCIUM: 10 mg/dL (ref 8.9–10.3)
CO2: 21 mmol/L — AB (ref 22–32)
CREATININE: 1.5 mg/dL — AB (ref 0.61–1.24)
Chloride: 107 mmol/L (ref 101–111)
GFR, EST AFRICAN AMERICAN: 50 mL/min — AB (ref >60)
GFR, EST NON AFRICAN AMERICAN: 43 mL/min — AB (ref >60)
Glucose, Bld: 129 mg/dL — ABNORMAL HIGH (ref 65–99)
Potassium: 4 mmol/L (ref 3.5–5.1)
SODIUM: 136 mmol/L (ref 135–145)
Total Protein: 7.1 g/dL (ref 6.5–8.1)

## 2016-01-25 LAB — VAS US DOPPLER PRE CABG
LCCADDIAS: 9 cm/s
LCCAPSYS: 65 cm/s
LEFT ECA DIAS: 0 cm/s
LEFT VERTEBRAL DIAS: -18 cm/s
LICADDIAS: -28 cm/s
LICAPDIAS: -18 cm/s
LICAPSYS: -57 cm/s
Left CCA dist sys: 46 cm/s
Left CCA prox dias: 12 cm/s
Left ICA dist sys: -94 cm/s
RCCAPSYS: 53 cm/s
RIGHT ECA DIAS: -7 cm/s
RIGHT VERTEBRAL DIAS: -8 cm/s
Right CCA prox dias: 8 cm/s

## 2016-01-25 LAB — CBC
HEMATOCRIT: 41.6 % (ref 39.0–52.0)
HEMOGLOBIN: 13.6 g/dL (ref 13.0–17.0)
MCH: 32.8 pg (ref 26.0–34.0)
MCHC: 32.7 g/dL (ref 30.0–36.0)
MCV: 100.2 fL — AB (ref 78.0–100.0)
Platelets: 155 10*3/uL (ref 150–400)
RBC: 4.15 MIL/uL — AB (ref 4.22–5.81)
RDW: 13.7 % (ref 11.5–15.5)
WBC: 7.3 10*3/uL (ref 4.0–10.5)

## 2016-01-25 LAB — GLUCOSE, CAPILLARY: GLUCOSE-CAPILLARY: 108 mg/dL — AB (ref 65–99)

## 2016-01-25 LAB — APTT: aPTT: 34 seconds (ref 24–36)

## 2016-01-25 MED ORDER — ALBUTEROL SULFATE (2.5 MG/3ML) 0.083% IN NEBU
2.5000 mg | INHALATION_SOLUTION | Freq: Once | RESPIRATORY_TRACT | Status: AC
  Administered 2016-01-25: 2.5 mg via RESPIRATORY_TRACT

## 2016-01-25 NOTE — Progress Notes (Signed)
PCP - [name] Cardiologist - Taylor  Chest x-ray - 01/25/16 EKG - 01/05/16 Stress Test - denies ECHO - 11/05/15 Cardiac Cath - 01/05/16  Fasting BS - 90s-160s depending on what he ate the night before  Patient was instructed to stop Elqulis 01/20/16    Patient denies shortness of breath, fever, cough and chest pain at PAT appointment

## 2016-01-25 NOTE — Progress Notes (Signed)
VASCULAR LAB PRELIMINARY RESULTS  Carotid duplex completed. Findings are consistent with a 1-39 percent stenosis involving the right internal carotid artery and the left internal carotid artery.   Upper Extremity Arterial completed    RIGHT   LEFT    PRESSURE WAVEFORM  PRESSURE WAVEFORM  BRACHIAL 163 Triphasic BRACHIAL 162 Triphasic  RADIAL  Triphasic RADIAL  Triphasic  ULNAR  Triphasic ULNAR  Triphasic    RIGHT LEFT  PALMAR ARCH Palmar waveforms are within normal limits with radial and ulnar compression. Palmar waveforms are within normal limits with radial compression and decrease greater than 50 percent with ulnar compression.    Legrand Como 01/25/2016 11:17 AM

## 2016-01-25 NOTE — Pre-Procedure Instructions (Signed)
[name]  01/25/2016      [facility] - [place], [address] [place] [address] Phone: [phone] Fax: [phone]  [id number] - [place], [address] [place] [address] Phone: [phone] Fax: [phone]  [facility] [place], [address] [address] Phone: [phone] Fax: [phone]    Your procedure is scheduled on September 7  Report to [place] at [place].M.  Call this number if you have problems the morning of surgery:  [phone]   Remember:  Do not eat food or drink liquids after midnight.   Take these medicines the morning of surgery with A SIP OF WATER dofetilide (tikosyn), oxybutynin (ditropan)  7 days prior to surgery STOP taking any Aspirin, Aleve, Naproxen, Ibuprofen, Motrin, Advil, Goody's, BC's, all herbal medications, fish oil, and all vitamins  WHAT DO I DO ABOUT MY DIABETES MEDICATION?   Do not take oral diabetes medicines (pills) the morning of surgery. DO NOT TAKE METFORMIN OR GLIPIZIDE   How to Manage Your Diabetes Before and After Surgery  Why is it important to control my blood sugar before and after surgery? . Improving blood sugar levels before and after surgery helps healing and can limit problems. . A way of improving blood sugar control is eating a healthy diet by: o  Eating less sugar and carbohydrates o  Increasing activity/exercise o  Talking with your doctor about reaching your blood sugar goals . High blood sugars (greater than 180 mg/dL) can raise your risk of infections and slow your recovery, so you will need to focus on controlling your diabetes during the weeks before surgery. . Make sure that the doctor who takes care of your diabetes knows about your planned surgery including the date and location.  How do I manage  my blood sugar before surgery? . Check your blood sugar at least 4 times a day, starting 2 days before surgery, to make sure that the level is not too high or low. o Check your blood sugar the morning of your surgery when you wake up and every 2 hours until you get to the Short Stay unit. . If your blood sugar is less than 70 mg/dL, you will need to treat for low blood sugar: o Do not take insulin. o Treat a low blood sugar (less than 70 mg/dL) with  cup of clear juice (cranberry or apple), 4 glucose tablets, OR glucose gel. o Recheck blood sugar in 15 minutes after treatment (to make sure it is greater than 70 mg/dL). If your blood sugar is not greater than 70 mg/dL on recheck, call [phone] for further instructions. . Report your blood sugar to the short stay nurse when you get to Short Stay.  . If you are admitted to the hospital after surgery: o Your blood sugar will be checked by the staff and you will probably be given insulin after surgery (instead of oral diabetes medicines) to make sure you have good blood sugar levels. o The goal for blood sugar control after surgery is 80-180 mg/dL.    Do not wear jewelry, make-up or nail polish.  Do not wear lotions, powders, or perfumes, or deoderant.  Do not shave 48 hours prior to surgery.  Men may shave face and neck.  Do not bring valuables to the hospital.  [facility]  is not responsible for any belongings or valuables.  Contacts, dentures or bridgework may not be worn into surgery.  Leave your suitcase in the car.  After surgery it may be brought to your room.  For patients admitted to the hospital, discharge time will be determined by your treatment team.  Patients discharged the day of surgery will not be allowed to drive home.    Special instructions:   [facility]- Preparing For Surgery  Before surgery, you can play an important role. Because skin is not sterile, your skin needs to be as free of germs as possible. You can  reduce the number of germs on your skin by washing with CHG (chlorahexidine gluconate) Soap before surgery.  CHG is an antiseptic cleaner which kills germs and bonds with the skin to continue killing germs even after washing.  Please do not use if you have an allergy to CHG or antibacterial soaps. If your skin becomes reddened/irritated stop using the CHG.  Do not shave (including legs and underarms) for at least 48 hours prior to first CHG shower. It is OK to shave your face.  Please follow these instructions carefully.   1. Shower the NIGHT BEFORE SURGERY and the MORNING OF SURGERY with CHG.   2. If you chose to wash your hair, wash your hair first as usual with your normal shampoo.  3. After you shampoo, rinse your hair and body thoroughly to remove the shampoo.  4. Use CHG as you would any other liquid soap. You can apply CHG directly to the skin and wash gently with a scrungie or a clean washcloth.   5. Apply the CHG Soap to your body ONLY FROM THE NECK DOWN.  Do not use on open wounds or open sores. Avoid contact with your eyes, ears, mouth and genitals (private parts). Wash genitals (private parts) with your normal soap.  6. Wash thoroughly, paying special attention to the area where your surgery will be performed.  7. Thoroughly rinse your body with warm water from the neck down.  8. DO NOT shower/wash with your normal soap after using and rinsing off the CHG Soap.  9. Pat yourself dry with a CLEAN TOWEL.   10. Wear CLEAN PAJAMAS   11. Place CLEAN SHEETS on your bed the night of your first shower and DO NOT SLEEP WITH PETS.    Day of Surgery: Do not apply any deodorants/lotions. Please wear clean clothes to the hospital/surgery center.      Please read over the following fact sheets that you were given. Pain Booklet, Coughing and Deep Breathing, Blood Transfusion Information, MRSA Information and Surgical Site Infection Prevention, incentive spirometry

## 2016-01-25 NOTE — Progress Notes (Signed)
Patient had a question about the consent form.  On the consent order it lists an aortic valve replacement and a maze procedure, but patient thought Dr. Roxy Manns would also be doing a "clipping of an appendage on the heart".  I called Levonne Spiller, RN and left message for her about the patients concerns about the consent

## 2016-01-26 ENCOUNTER — Encounter (HOSPITAL_COMMUNITY): Payer: Self-pay | Admitting: Certified Registered Nurse Anesthetist

## 2016-01-26 LAB — HEMOGLOBIN A1C
HEMOGLOBIN A1C: 6.4 % — AB (ref 4.8–5.6)
Mean Plasma Glucose: 137 mg/dL

## 2016-01-26 MED ORDER — VANCOMYCIN HCL 1000 MG IV SOLR
INTRAVENOUS | Status: AC
  Administered 2016-01-27: 1000 mL
  Filled 2016-01-26: qty 1000

## 2016-01-26 MED ORDER — DEXTROSE 5 % IV SOLN
1.5000 g | INTRAVENOUS | Status: AC
  Administered 2016-01-27: 1.5 g via INTRAVENOUS
  Administered 2016-01-27: .75 g via INTRAVENOUS
  Filled 2016-01-26 (×2): qty 1.5

## 2016-01-26 MED ORDER — EPINEPHRINE HCL 1 MG/ML IJ SOLN
0.0000 ug/min | INTRAVENOUS | Status: DC
  Filled 2016-01-26: qty 4

## 2016-01-26 MED ORDER — PLASMA-LYTE 148 IV SOLN
INTRAVENOUS | Status: DC
  Filled 2016-01-26: qty 2.5

## 2016-01-26 MED ORDER — METOPROLOL TARTRATE 12.5 MG HALF TABLET
12.5000 mg | ORAL_TABLET | Freq: Once | ORAL | Status: AC
  Administered 2016-01-27: 12.5 mg via ORAL
  Filled 2016-01-26: qty 1

## 2016-01-26 MED ORDER — NITROGLYCERIN IN D5W 200-5 MCG/ML-% IV SOLN
2.0000 ug/min | INTRAVENOUS | Status: DC
  Filled 2016-01-26: qty 250

## 2016-01-26 MED ORDER — CEFUROXIME SODIUM 750 MG IJ SOLR
750.0000 mg | INTRAMUSCULAR | Status: DC
  Filled 2016-01-26: qty 750

## 2016-01-26 MED ORDER — INSULIN REGULAR HUMAN 100 UNIT/ML IJ SOLN
INTRAMUSCULAR | Status: AC
  Administered 2016-01-27: .7 [IU]/h via INTRAVENOUS
  Filled 2016-01-26: qty 2.5

## 2016-01-26 MED ORDER — PHENYLEPHRINE HCL 10 MG/ML IJ SOLN
30.0000 ug/min | INTRAVENOUS | Status: AC
  Administered 2016-01-27: 15 ug/min via INTRAVENOUS
  Filled 2016-01-26: qty 2

## 2016-01-26 MED ORDER — DOPAMINE-DEXTROSE 3.2-5 MG/ML-% IV SOLN
0.0000 ug/kg/min | INTRAVENOUS | Status: DC
  Filled 2016-01-26: qty 250

## 2016-01-26 MED ORDER — CHLORHEXIDINE GLUCONATE 0.12 % MT SOLN
15.0000 mL | Freq: Once | OROMUCOSAL | Status: AC
  Administered 2016-01-27: 15 mL via OROMUCOSAL
  Filled 2016-01-26: qty 15

## 2016-01-26 MED ORDER — DEXMEDETOMIDINE HCL IN NACL 400 MCG/100ML IV SOLN
0.1000 ug/kg/h | INTRAVENOUS | Status: AC
  Administered 2016-01-27: .3 ug/kg/h via INTRAVENOUS
  Filled 2016-01-26: qty 100

## 2016-01-26 MED ORDER — SODIUM CHLORIDE 0.9 % IV SOLN
INTRAVENOUS | Status: DC
  Filled 2016-01-26: qty 30

## 2016-01-26 MED ORDER — POTASSIUM CHLORIDE 2 MEQ/ML IV SOLN
80.0000 meq | INTRAVENOUS | Status: DC
  Filled 2016-01-26: qty 40

## 2016-01-26 MED ORDER — SODIUM CHLORIDE 0.9 % IV SOLN
INTRAVENOUS | Status: AC
  Administered 2016-01-27: 70 mL/h via INTRAVENOUS
  Filled 2016-01-26: qty 40

## 2016-01-26 MED ORDER — MAGNESIUM SULFATE 50 % IJ SOLN
40.0000 meq | INTRAMUSCULAR | Status: DC
  Filled 2016-01-26: qty 10

## 2016-01-26 MED ORDER — VANCOMYCIN HCL 10 G IV SOLR
1500.0000 mg | INTRAVENOUS | Status: AC
  Administered 2016-01-27: 1500 mg via INTRAVENOUS
  Filled 2016-01-26: qty 1500

## 2016-01-26 NOTE — H&P (Signed)
[address]       [place] [address]             [phone]          CARDIOTHORACIC SURGERY HISTORY AND PHYSICAL EXAM  Referring Provider is [name], MD  Primary Cardiologist is [name], MD PCP is [name], MD      Chief Complaint  Patient presents with  . Aortic Stenosis    Surgical eval for possible AVR, TEE 11/05/15, Cardiac Cath PENDING    HPI:  Patient is a 75 year old obese white male with history of aortic stenosis, chronic persistent atrial fibrillation, chronic diastolic congestive heart failure, hypertension, type 2 diabetes mellitus with complications, and stage III chronic kidney disease Who has been referred for surgical consultation to discuss treatment options for management of severe symptomatic aortic stenosis. The patient has a long-standing history of chronic diastolic congestive heart failure and atrial fibrillation.  He has been followed for several years by Dr. Lovena Le. Transthoracic echocardiograms have suggested the presence of moderate aortic stenosis with preserved left ventricular systolic function. The patient also has been treated for atrial fibrillation which has failed medical therapy with Tikosyn.  He has been chronically anticoagulated using Eliquis but he recently suffered a retinal hemorrhage that required surgical intervention. The patient states that he has been instructed to resume taking Eliquis at the previous dose but he has decreased the dose on his own because of concerns regarding risk of bleeding. He was referred to Dr. [name] to consider placement of a Watchman left atrial occluder.  Concerns were raised regarding whether or not the patient's aortic stenosis might be severe. The patient subsequently underwent transesophageal echocardiogram which confirmed the presence of severe aortic stenosis. The patient was referred for surgical consultation to discuss treatment options further.  The patient  is married and lives locally with his wife in [place]. 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 He retired in 1989. He has been obese for much of his adult life. He does not exercise on a regular basis although he does enjoy gardening and yard work. He reports a long-standing history of exertional shortness of breath dating back at least 4 or 5 years. Symptoms have been slowly progressing.  He now gets short of breath with moderate level activity and this limits his physical activities to a significant degree. He gets short of breath just walking to the mailbox and back. He denies any history of resting shortness of breath, PND, orthopnea, dizzy spells, syncope, or lower extremity edema. He has never had any chest pain or chest tightness either with activity or at rest.  He does not experience palpitations and cannot tell when he is in sinus rhythm or atrial fibrillation.  His activity level is also notable for some problems with arthritis as well as poor balance. He has a chronic tremor particularly involving his right arm.  Patient returns to the office today for follow-up of severe symptomatic aortic stenosis and atrial fibrillation. He was originally seen in consultation on 12/27/2015. Since then the patient underwent CT scan of the chest, cardiac catheterization, and a formal physical therapy evaluation. He returns to our office today to review the results of these tests and discuss treatment options further. He reports no new problems or complaints since his last visit.     Past Medical History:  Diagnosis Date  . Aortic stenosis 12/2010  .  Atrial fibrillation ([facility])   . CHF (congestive heart failure) ([facility])   . Constipation   . Coronary artery disease   . Exogenous obesity   . GERD (gastroesophageal reflux disease)    occasional  . Heart murmur    as a child  . History of chicken pox   . Hypertension   . Kidney  stones remote  . Lyme disease 2012   ?titers negative  . Neuromuscular disorder ([facility])    tremor right arm  . Shortness of breath dyspnea   . Type 2 diabetes, controlled, with retinopathy ([facility]) 1980s  . Urge incontinence   . Vitamin B12 deficiency    IF normal (2014)    Past Surgical History:  Procedure Laterality Date  . BACK SURGERY  20 yrs ago   removal of lower back cartilage due to damage. Patient does not remember date of surgery.  Marland Kitchen CARDIAC CATHETERIZATION N/A 01/05/2016   Procedure: Right/Left Heart Cath and Coronary Angiography;  Surgeon: Burnell Blanks, MD;  Location: [facility];  Service: Cardiovascular;  Laterality: N/A;  . CARDIOVERSION N/A 02/25/2015   Procedure: CARDIOVERSION;  Surgeon: Skeet Latch, MD;  Location: [facility] ENDOSCOPY;  Service: Cardiovascular;  Laterality: N/A;  . CATARACT EXTRACTION Bilateral 2015   [facility]  . COLONOSCOPY    . EYE SURGERY     left eye "cleaned"  . finger sx    . kidney stone removal    . TEE WITHOUT CARDIOVERSION N/A 11/05/2015   Procedure: TRANSESOPHAGEAL ECHOCARDIOGRAM (TEE);  Surgeon: Pixie Casino, MD;  Location: [facility] ENDOSCOPY;  Service: Cardiovascular;  Laterality: N/A;  . TOTAL HIP ARTHROPLASTY  03/12/2012   RIGHT TOTAL HIP ARTHROPLASTY ANTERIOR APPROACH;  Surgeon: Mcarthur Rossetti, MD;  . US ECHOCARDIOGRAPHY  12/2010   EF 50-55%, mild LVH, normal wall motion, high LV filling pressures, mild AS, LA mildly dilated    Family History  Problem Relation Age of Onset  . Diabetes Mother   . Heart disease Mother   . Diabetes Father   . Heart disease Father   . Cancer Brother     throat    Social History Social History  Substance Use Topics  . Smoking status: Never Smoker  . Smokeless tobacco: Never Used  . Alcohol use Yes     Comment: Regular (bourbon and coke 2-3/day)    Prior to Admission medications   Medication Sig Start Date End Date Taking? Authorizing Provider  apixaban  (ELIQUIS) 5 MG TABS tablet Take 1 tablet (5 mg total) by mouth 2 (two) times daily. 01/11/16  Yes Burnell Blanks, MD  cholecalciferol (VITAMIN D) 1000 UNITS tablet Take 1,000 Units by mouth every morning.    Yes Historical Provider, MD  dofetilide (TIKOSYN) 250 MCG capsule Take 1 capsule (250 mcg total) by mouth 2 (two) times daily. 02/25/15  Yes Skeet Latch, MD  furosemide (LASIX) 20 MG tablet Take 20 mg by mouth daily. Per Patient, Dr. Lovena Le stated he could take 40 mg per day by mouth, but patient doesn't always take 40mg  by mouth daily 02/20/15  Yes [name], MD  glipiZIDE (GLUCOTROL) 10 MG tablet Take 5-10 mg by mouth 2 (two) times daily before a meal. Take 10 mg by mouth daily with breakfast, 5 mg daily with dinner   Yes Historical Provider, MD  glucose blood (ONE TOUCH ULTRA TEST) test strip Use to check sugar once daily and as needed. Dx:E11.319 **One Touch Ultra** 11/03/15  Yes [name], MD  ibuprofen (ADVIL,MOTRIN) 400 MG tablet Take 400 mg by mouth 2 (two) times daily as needed (pain).   Yes Historical Provider, MD  Magnesium Oxide 250 MG TABS Take 1 tablet by mouth daily as needed (supplement).    Yes Historical Provider, MD  metFORMIN (GLUCOPHAGE) 500 MG tablet Take 500 mg by mouth 2 (two) times daily with a meal.    Yes Historical Provider, MD  Multiple Vitamin (MULTIVITAMIN WITH MINERALS) TABS tablet Take by mouth daily. 1 packet of 5 vitamins daily   Yes Historical Provider, MD  multivitamin-lutein (OCUVITE-LUTEIN) CAPS capsule Take 3 capsules by mouth daily.   Yes Historical Provider, MD  oxybutynin (DITROPAN) 5 MG tablet Take 1 tablet (5 mg total) by mouth 3 (three) times daily. 09/15/14  Yes [name], MD  vitamin B-12 (CYANOCOBALAMIN) 1000 MCG tablet Take 1,000 mcg by mouth daily.   Yes Historical Provider, MD    Allergies  Allergen Reactions  . No Known Allergies       Review of Systems:                        General:                                           normal appetite, decreased energy, no weight gain, no weight loss, no fever                       Cardiac:                                           no chest pain with exertion, no chest pain at rest, + SOB with exertion, no resting SOB, no PND, no orthopnea, no palpitations, + arrhythmia, + atrial fibrillation, no LE edema, no dizzy spells, no syncope                       Respiratory:                                     + shortness of breath, no home oxygen, no productive cough, no dry cough, no bronchitis, no wheezing, no hemoptysis, no asthma, no pain with inspiration or cough, no sleep apnea, no CPAP at night                       GI:                                                             no difficulty swallowing, no reflux, no frequent heartburn, no hiatal hernia, no abdominal pain, + constipation, no diarrhea, no hematochezia, no hematemesis, no melena                       GU:  no dysuria,  no frequency, no urinary tract infection, no hematuria, no enlarged prostate, no kidney stones, + kidney disease                       Vascular:                                         no pain suggestive of claudication, no pain in feet, no leg cramps, no varicose veins, no DVT, no non-healing foot ulcer                       Neuro:                                                       no stroke, no TIA's, no seizures, no headaches, + temporary blindness one eye,  no slurred speech, no peripheral neuropathy, no chronic pain, + instability of gait, no memory/cognitive dysfunction                       Musculoskeletal:                   + arthritis, no joint swelling, no myalgias, mild difficulty walking, somewhat reduced mobility                        Skin:                                                          no rash, no itching, no skin infections, no pressure sores or ulcerations                       Psych:                                                        no anxiety, no depression, no nervousness, no unusual recent stress                       Eyes:                                                         + blurry vision, no floaters, + recent vision changes, no wears glasses or contacts                       ENT:  no hearing loss, no loose or painful teeth, no dentures, last saw dentist 6/17                       Hematologic:                                   + easy bruising, no abnormal bleeding, no clotting disorder, no frequent epistaxis                       Endocrine:                                       + diabetes, does check CBG's at home                                               Physical Exam:                        BP 140/78 (BP Location: Left Arm, Patient Position: Sitting, Cuff Size: Large)   Pulse 69   Resp 18   Ht 6\' 2"  (1.88 m)   Wt 254 lb (115.2 kg)   SpO2 97% Comment: RA  BMI 32.61 kg/m                        General:                                          Obese,  well-appearing                       HEENT:                                           Unremarkable                        Neck:                                                         no JVD, no bruits, no adenopathy                        Chest:                                                        clear to auscultation, symmetrical breath sounds, no wheezes, no rhonchi                        CV:  Irregular rate and rhythm, grade III/VI crescendo/decrescendo systolic murmur                        Abdomen:                                        soft, non-tender, no masses                        Extremities:                                     warm, well-perfused, pulses diminished, no LE edema                       Rectal/GU                                       Deferred                       Neuro:                                                        Grossly non-focal and symmetrical throughout                       Skin:                                                          Clean and dry, no rashes, no breakdown   Diagnostic Tests:  Transthoracic Echocardiography  (Report amended )  Patient: Kymani, Hardt MR #: [id number] Study Date: 10/21/2015 Gender: M Age: 62 Height: 182.9 cm Weight: 122.8 kg BSA: 2.54 m^2 Pt. Status: Room:  ORDERING Cristopher Peru, MD REFERRING Cristopher Peru, MD ATTENDING Fransico Him, MD PERFORMING [facility], Outpatient SONOGRAPHER [facility], RDCS  cc:  ------------------------------------------------------------------- LV EF: 55% - 60%  ------------------------------------------------------------------- Indications: Aortic Stenosis (I35.9).  ------------------------------------------------------------------- History: PMH: ETOH Abuse, Obesity, Chronic Kidney Disease. Atrial fibrillation. Congestive heart failure. Risk factors: Family history of coronary artery disease. Hypertension. Diabetes mellitus. Dyslipidemia.  ------------------------------------------------------------------- Study Conclusions  - Left ventricle: The cavity size was normal. There was moderate concentric hypertrophy. Systolic function was normal. The estimated ejection fraction was in the range of 55% to 60%. Wall motion was normal; there were no regional wall motion abnormalities. - Aortic valve: Severe diffuse thickening and calcification. Valve mobility was restricted. There was moderate stenosis. Mean gradient (S): 25 mm Hg. Valve area (VTI): 1.18 cm^2. Valve area (Vmax): 1.08 cm^2. Valve area (Vmean): 1.16 cm^2. - Mitral valve: Calcified annulus. Mild diffuse calcification. There was mild regurgitation. - Right ventricle: The cavity size was severely dilated.  Wall thickness was normal. - Right atrium: The atrium was moderately dilated. - Tricuspid valve: There was trivial regurgitation.  Impressions:  -  Normal LVF, moderate LVF, moderate to severe AS. Compared to prior echo, AS as progressed.  Transthoracic echocardiography. M-mode, complete 2D, spectral Doppler, and color Doppler. Birthdate: Patient birthdate: 09-21-1940. Age: Patient is 75 yr old. Sex: Gender: male. BMI: 36.7 kg/m^2. Blood pressure: 130/84 Patient status: Outpatient. Study date: Study date: 10/21/2015. Study time: 08:08 AM. Location: [facility]  -------------------------------------------------------------------  ------------------------------------------------------------------- Left ventricle: The cavity size was normal. There was moderate concentric hypertrophy. Systolic function was normal. The estimated ejection fraction was in the range of 55% to 60%. Wall motion was normal; there were no regional wall motion abnormalities.  ------------------------------------------------------------------- Aortic valve: Probably trileaflet. Severe diffuse thickening and calcification. Valve mobility was restricted. Doppler: There was moderate stenosis. There was no regurgitation. VTI ratio of LVOT to aortic valve: 0.24. Valve area (VTI): 1.18 cm^2. Indexed valve area (VTI): 0.46 cm^2/m^2. Peak velocity ratio of LVOT to aortic valve: 0.22. Valve area (Vmax): 1.08 cm^2. Indexed valve area (Vmax): 0.42 cm^2/m^2. Mean velocity ratio of LVOT to aortic valve: 0.24. Valve area (Vmean): 1.16 cm^2. Indexed valve area (Vmean): 0.46 cm^2/m^2. Mean gradient (S): 25 mm Hg. Peak gradient (S): 47 mm Hg.  ------------------------------------------------------------------- Aorta: Aortic root: The aortic root was normal in size.  ------------------------------------------------------------------- Mitral valve: Calcified annulus. Mild  diffuse calcification. Mobility was not restricted. Doppler: Transvalvular velocity was within the normal range. There was no evidence for stenosis. There was mild regurgitation.  ------------------------------------------------------------------- Left atrium: The atrium was normal in size.  ------------------------------------------------------------------- Right ventricle: The cavity size was severely dilated. Wall thickness was normal. Systolic function was normal.  ------------------------------------------------------------------- Pulmonic valve: Structurally normal valve. Cusp separation was normal. Doppler: Transvalvular velocity was within the normal range. There was no evidence for stenosis. There was no regurgitation.  ------------------------------------------------------------------- Tricuspid valve: Structurally normal valve. Doppler: Transvalvular velocity was within the normal range. There was trivial regurgitation.  ------------------------------------------------------------------- Pulmonary artery: The main pulmonary artery was normal-sized. Systolic pressure could not be accurately estimated.  ------------------------------------------------------------------- Right atrium: The atrium was moderately dilated.  ------------------------------------------------------------------- Pericardium: There was no pericardial effusion.  ------------------------------------------------------------------- Systemic veins: Inferior vena cava: The vessel was normal in size. The respirophasic diameter changes were in the normal range (= 50%), consistent with normal central venous pressure. Diameter: 18.8 mm.  ------------------------------------------------------------------- Measurements  IVC Value Reference ID 18.8 mm ---------  Left ventricle  Value Reference LV ID, ED, PLAX chordal (L) 41.6 mm 43 - 52 LV ID, ES, PLAX chordal 24.4 mm 23 - 38 LV fx shortening, PLAX chordal 41 % >=29 LV PW thickness, ED 15.9 mm --------- IVS/LV PW ratio, ED 1.05 <=1.3 Stroke volume, 2D 98 ml --------- Stroke volume/bsa, 2D 39 ml/m^2 --------- LV e&', lateral 9.36 cm/s --------- LV e&', medial 15.1 cm/s --------- LV e&', average 12.23 cm/s ---------  Ventricular septum Value Reference IVS thickness, ED 16.7 mm ---------  LVOT Value Reference LVOT ID, S 25 mm --------- LVOT area 4.91 cm^2 --------- LVOT peak velocity, S 75.4 cm/s --------- LVOT mean velocity, S 53 cm/s --------- LVOT VTI, S 19.9 cm ---------  Aortic valve Value Reference Aortic valve peak velocity, S 344 cm/s --------- Aortic valve mean velocity, S 224 cm/s --------- Aortic valve VTI, S 82.8 cm --------- Aortic mean gradient, S 25 mm Hg --------- Aortic peak gradient, S 47 mm Hg --------- VTI ratio, LVOT/AV 0.24 --------- Aortic valve area, VTI 1.18 cm^2 --------- Aortic valve area/bsa, VTI 0.46 cm^2/m^2 --------- Velocity ratio, peak, LVOT/AV  0.22 --------- Aortic valve area, peak velocity 1.08 cm^2 ---------  Aortic valve area/bsa, peak 0.42 cm^2/m^2 --------- velocity Velocity ratio, mean, LVOT/AV 0.24 --------- Aortic valve area, mean velocity 1.16 cm^2 --------- Aortic valve area/bsa, mean 0.46 cm^2/m^2 --------- velocity  Aorta Value Reference Aortic root ID, ED 36 mm --------- Ascending aorta ID, A-P, S 35 mm ---------  Left atrium Value Reference LA ID, A-P, ES 51 mm --------- LA ID/bsa, A-P 2.01 cm/m^2 <=2.2 LA volume, ES, 1-p A4C 109 ml --------- LA volume/bsa, ES, 1-p A4C 42.9 ml/m^2 --------- LA volume, ES, 1-p A2C 227 ml --------- LA volume/bsa, ES, 1-p A2C 89.3 ml/m^2 ---------  Tricuspid valve Value Reference Tricuspid regurg peak velocity 272 cm/s --------- Tricuspid peak RV-RA gradient 30 mm Hg ---------  Right ventricle Value Reference TAPSE 24 mm ---------  Legend: (L) and (H) mark values outside specified reference range.  ------------------------------------------------------------------- Michaelle Birks, MD 2017-06-01T09:38:21   Transesophageal Echocardiography  Patient: Temitayo, Parkhouse MR #: [id number] Study Date: 11/05/2015 Gender: M Age: 26 Height: 188 cm Weight: 120.9 kg BSA: 2.55 m^2 Pt. Status: Room:  SONOGRAPHER Tresa Res, RDCS ADMITTING Lyman Bishop  MD [place] MD [place] MD Emelia Loron, Amber K REFERRING Lynnell Jude, Amber K  cc:  ------------------------------------------------------------------- LV EF: 60% - 65%  ------------------------------------------------------------------- Indications: Atrial fibrillation - 427.31.  ------------------------------------------------------------------- Study Conclusions  - Left ventricle: There was mild concentric hypertrophy. Systolic function was normal. The estimated ejection fraction was in the range of 60% to 65%. Wall motion was normal; there were no regional wall motion abnormalities. - Aortic valve: Heavily calcified with restricted leaflet motion. There is severe aortic stenosis - AVA of 0.93 cm2 - mean gradient of 43 mmHg and peak gradient of 71 mmHg. Valve area (VTI): 0.99 cm^2. Valve area (Vmax): 1.27 cm^2. Valve area (Vmean): 1.09 cm^2. - Aorta: Mild atheromatous disease. - Mitral valve: Mildly thickened leaflets . There was mild regurgitation. - Left atrium: The atrium was dilated. No evidence of thrombus in the atrial cavity or appendage. The appendage is windsock shaped. - Pulmonary veins: No anomaly. - Right atrium: No evidence of thrombus in the atrial cavity or appendage. - Atrial septum: No defect or patent foramen ovale was identified.  Impressions:  - Severe aortic stenosis - AVA 0.93 cm2 with mean gradient of 43 mmHg. Maximal appendage measurement of 3.15 x 3.47 cm - windsock appearance. While this may be suitable for Watchman, given the severity of his aortic stenosis- evaluation for aortic valve replacement +/- MAZE and LAA surgical closure is recommended.  Diagnostic transesophageal echocardiography. 2D and color Doppler. Birthdate: Patient birthdate: 1940/09/07. Age: Patient is 75 yr old. Sex: Gender: male. BMI: 34.2 kg/m^2. Blood pressure: 126/82  Patient status: Outpatient. Study date: Study date: 11/05/2015. Study time: 09:07 AM. Location: Endoscopy.  -------------------------------------------------------------------  ------------------------------------------------------------------- Left ventricle: There was mild concentric hypertrophy. Systolic function was normal. The estimated ejection fraction was in the range of 60% to 65%. Wall motion was normal; there were no regional wall motion abnormalities.  ------------------------------------------------------------------- Aortic valve: Heavily calcified with restricted leaflet motion. There is severe aortic stenosis - AVA of 0.93 cm2 - mean gradient of 43 mmHg and peak gradient of 71 mmHg. Doppler: VTI ratio of LVOT to aortic valve: 0.19. Valve area (VTI): 0.99 cm^2. Indexed valve area (VTI): 0.39 cm^2/m^2. Peak velocity ratio of LVOT to aortic valve: 0.24. Valve area (Vmax): 1.27 cm^2. Indexed valve area (Vmax): 0.5 cm^2/m^2. Mean velocity ratio of LVOT to aortic valve: 0.21. Valve area (Vmean): 1.09 cm^2. Indexed valve area (Vmean): 0.43 cm^2/m^2.  Mean gradient (S): 40 mm Hg. Peak gradient (S): 65 mm Hg.  ------------------------------------------------------------------- Aorta: Mild atheromatous disease.  ------------------------------------------------------------------- Mitral valve: Mildly thickened leaflets . Doppler: There was mild regurgitation.  ------------------------------------------------------------------- Left atrium: The atrium was dilated. No evidence of thrombus in the atrial cavity or appendage. The maximal appendage measurement was 3.15 cm x 3.47 cm. The appendage is windsock shaped.  ------------------------------------------------------------------- Atrial septum: No defect or patent foramen ovale was identified.  ------------------------------------------------------------------- Pulmonary veins: No  anomaly.  ------------------------------------------------------------------- Right ventricle: The cavity size was normal. Wall thickness was normal. Systolic function was normal.  ------------------------------------------------------------------- Pulmonic valve: Poorly visualized.  ------------------------------------------------------------------- Tricuspid valve: Doppler: There was trivial regurgitation.  ------------------------------------------------------------------- Right atrium: The atrium was normal in size. No evidence of thrombus in the atrial cavity or appendage.  ------------------------------------------------------------------- Pericardium: There was no pericardial effusion.  ------------------------------------------------------------------- Post procedure conclusions Ascending Aorta:  - Mild atheromatous disease.  ------------------------------------------------------------------- Measurements  Left ventricle Value Stroke volume, 2D 98 ml Stroke volume/bsa, 2D 38 ml/m^2  LVOT Value LVOT ID, S 26 mm LVOT area 5.31 cm^2 LVOT peak velocity, S 96.6 cm/s LVOT mean velocity, S 59.8 cm/s LVOT VTI, S 18.4 cm  Aortic valve Value Aortic valve peak velocity, S 403 cm/s Aortic valve mean velocity, S 291 cm/s Aortic valve VTI, S 98.8 cm Aortic mean gradient, S 40 mm Hg Aortic peak gradient, S 65 mm Hg VTI ratio, LVOT/AV 0.19 Aortic valve area, VTI 0.99 cm^2 Aortic valve area/bsa, VTI 0.39 cm^2/m^2 Velocity ratio,  peak, LVOT/AV 0.24 Aortic valve area, peak velocity 1.27 cm^2 Aortic valve area/bsa, peak velocity 0.5 cm^2/m^2 Velocity ratio, mean, LVOT/AV 0.21 Aortic valve area, mean velocity 1.09 cm^2 Aortic valve area/bsa, mean velocity 0.43 cm^2/m^2  Legend: (L) and (H) mark values outside specified reference range.  ------------------------------------------------------------------- Prepared and Electronically Authenticated by  Lyman Bishop MD 2017-06-16T17:29:07   CT CHEST WITHOUT CONTRAST  TECHNIQUE: Multidetector CT imaging of the chest was performed following the standard protocol without IV contrast.  COMPARISON: 03/20/2011  FINDINGS: Cardiovascular: The aortic valve is heavily calcified. The thoracic aorta it does not contain significant plaque with a mild amount of plaque present at the level of the arch. There is no evidence of thoracic aortic aneurysmal disease. The ascending thoracic aorta measures 3.5 cm in greatest diameter the proximal arch measures 3.2 cm. The distal arch measures 3.3 cm. The descending thoracic aorta measures 3 cm.  There is evidence of coronary atherosclerosis with heavily calcified plaque in the distributions of the LAD, left circumflex and right coronary arteries. The PDA is also calcified at the base of the heart. The heart size is normal. No pericardial fluid identified.  Mediastinum/Nodes: No evidence of mediastinal masses or lymphadenopathy.  Lungs/Pleura: Mild pulmonary parenchymal scarring at the left lung base. There is no evidence of pulmonary edema, consolidation, pneumothorax, nodule or pleural fluid.  Upper Abdomen: Visualized upper abdominal structures demonstrate multiple calcified gallstones within the visualize gallbladder. The pancreas is atrophic and infiltrated by fat.  Musculoskeletal: Bony structures show mild spondylosis throughout the thoracic  spine.  IMPRESSION: 1. Heavily calcified aortic valve. No evidence of thoracic aortic aneurysmal disease or significant aortic calcification. 2. Coronary atherosclerosis with heavily calcified plaque visualized by CT in a 3 vessel distribution. 3. No acute findings in the chest or incidental masses. 4. Cholelithiasis with multiple gallstones visualized in the gallbladder lumen. 5. Atrophy and fatty infiltration of the pancreas.   Electronically Signed By: Aletta Edouard M.D. On: 12/31/2015 17:07    Right/Left Heart  Cath and Coronary Angiography  Conclusion     RPDA lesion, 20 %stenosed.  Mid RCA to Dist RCA lesion, 30 %stenosed.  Dist RCA lesion, 20 %stenosed.  Prox LAD to Mid LAD lesion, 20 %stenosed.  Mid LAD lesion, 30 %stenosed.  Dist LAD lesion, 20 %stenosed.  There is severe aortic valve stenosis.  1. Mild non-obstructive CAD 2. Hazy non-flow limiting mid LAD lesion which has the appearance of an eccentric calcified plaque, best seen in the cranial views. This is not seen in the caudal views. Cannot exclude eccentric thrombus but low suspicion for intra-coronary thrombus given the clinical scenario.  3. Severe aortic valve stenosis (peak to peak gradient visually is 40 mm Hg. The gradient reported does not account for catheter whip and variation seen in the waveforms during pullback. No AVA is reported due to this inaccurate data. I did not recross the aortic valve to remeasure).   Recommendations: Will continue planning for AVR, MAZE.    Indications   Severe aortic stenosis [I35.0 (ICD-10-CM)]  Procedural Details/Technique   Technical Details Indication: 75 yo male with atrial fibrillation, severe aortic stenosis who is undergoing planning for surgical AVR, MAZE and left atrial appendage ligation.   Procedure: The risks, benefits, complications, treatment options, and expected outcomes were discussed with the patient. The patient and/or  family concurred with the proposed plan, giving informed consent. The patient was brought to the cath lab after IV hydration was begun and oral premedication was given. The patient was further sedated with Versed and Fentanyl. There was an IV catheter present in the right antecubital vein. This area was prepped and draped. I changed out this catheter for a 5 French sheath. Right heart catheterization performed with a balloon tipped catheter. The right wrist was prepped and draped in a sterile fashion. 1% lidocaine was used for local anesthesia. Using the modified Seldinger access technique, a 5 French sheath was placed in the right radial artery. 3 mg Verapamil was given through the sheath. 6000 units IV heparin was given. Standard diagnostic catheters were used to perform selective coronary angiography. I crossed the aortic valve with an AL-1 catheter and a straight wire. LV pressures measured. No LV gram performed. The sheath was removed from the right radial artery and a Terumo hemostasis band was applied at the arteriotomy site on the right wrist.     Estimated blood loss <50 mL. . During this procedure the patient was administered the following to achieve and maintain moderate conscious sedation: Versed 1 mg, Fentanyl 25 mcg, while the patient's heart rate, blood pressure, and oxygen saturation were continuously monitored. The period of conscious sedation was 40 minutes, of which I was present face-to-face 100% of this time.    Complications   Complications documented before study signed (01/05/2016 2:16 PM EDT)   RIGHT/LEFT HEART CATH AND CORONARY ANGIOGRAPHY   None Documented by Burnell Blanks, MD 01/05/2016 1:12 PM EDT  Time Range: Intra-procedure      Coronary Findings   Dominance: Right  Left Anterior Descending  Prox LAD to Mid LAD lesion, 20% stenosed.  Mid LAD lesion, 30% stenosed. The lesion is calcified. There is filling defect noted in the mid LAD just after the  takeoff the takeoff of the diagonal branch. This appears to be an eccentric calcified plaque. There is no flow limitation.  Dist LAD lesion, 20% stenosed.  Second Diagonal Branch  Vessel is moderate in size.  Left Circumflex  Vessel is moderate in size.  First [employer]  Marginal Branch  Vessel is moderate in size.  Second Obtuse Marginal Branch  Vessel is moderate in size.  Right Coronary Artery  Vessel is large.  Mid RCA to Dist RCA lesion, 30% stenosed. The lesion is discrete.  Dist RCA lesion, 20% stenosed. The lesion is discrete.  Right Posterior Descending Artery  Vessel is moderate in size.  RPDA lesion, 20% stenosed.  Left Heart   Aortic Valve There is severe aortic valve stenosis. The aortic valve is calcified.    Coronary Diagrams   Diagnostic Diagram     Implants        No implant documentation for this case.  PACS Images   Show images for Cardiac catheterization   Link to Procedure Log   Procedure Log    Hemo Data   Flowsheet Row Most Recent Value  Aortic Mean Gradient 20.3 mmHg  Aortic Peak Gradient 23 mmHg  RA A Wave -99 mmHg  RA V Wave 5 mmHg  RA Mean 3 mmHg  RV Systolic Pressure 31 mmHg  RV Diastolic Pressure -1 mmHg  RV EDP 3 mmHg  PA Systolic Pressure 29 mmHg  PA Diastolic Pressure 12 mmHg  PA Mean 18 mmHg  PW A Wave -99 mmHg  PW V Wave 16 mmHg  PW Mean 11 mmHg  AO Systolic Pressure [id number] mmHg  AO Diastolic Pressure 66 mmHg  AO Mean 95 mmHg  LV Systolic Pressure [id number] mmHg  LV Diastolic Pressure 4 mmHg  LV EDP 15 mmHg  Arterial Occlusion Pressure Extended Systolic Pressure 0000000 mmHg  Arterial Occlusion Pressure Extended Diastolic Pressure 72 mmHg  Arterial Occlusion Pressure Extended Mean Pressure 106 mmHg  Left Ventricular Apex Extended Systolic Pressure [id number] mmHg  Left Ventricular Apex Extended Diastolic Pressure 3 mmHg  Left Ventricular Apex Extended EDP Pressure 12 mmHg      Impression:  Patient has stage D severe  symptomatic aortic stenosis. He presents with a long history of progressive symptoms of exertional shortness of breath and fatigue consistent with chronic diastolic congestive heart failure, New York Heart Association function class IIb. I have personally reviewed the patient's recent transthoracic and transesophageal echocardiograms, CT scan and cardiac catheterization. The patient's aortic valve is tricuspid with moderate to severe thickening, calcification, and restricted leaflet mobility involving all 3 leaflets. Previous transthoracic echocardiograms have demonstratedtransvalvular gradients consistent with moderate disease, butrecent transesophageal echocardiogram confirms that the patient really has severe aortic stenosis. Peak velocity across the aortic valve measured greater than 4 m/s corresponding to mean transvalvular gradient estimated 48 mmHg. Left ventricular systolic function remains preserved. Diagnostic cardiac catheterization is notable for the presence of moderate nonobstructive coronary artery disease. Peak to peak gradient across the aortic valve was estimated 40 mmHg. CT scan of the chest reveals normal-sized ascending thoracic aorta with no significant calcification in the aortic root proximal aorta.  Risks associated with conventional surgical aortic valve replacement will be reasonably low but somewhat elevated because of the patient's numerous comorbid medical problems.  Given the patient's history of long-standing atrial fibrillation and problems with taking long-term anticoagulation, he would certainly benefit from obliteration of his left atrial appendage and he might do well following Maze procedure.  The patient did reasonably well with his physical therapy evaluation, although it seems likely he will likely need home health physical therapy during his early postoperative recovery.    Plan:  The patient and his wife were again counseled at length regarding treatment  alternatives for management of severe aortic stenosis including continued medical  therapy versus proceeding with aortic valve replacement in the near future.  The natural history of aortic stenosis was reviewed, as was long term prognosis with medical therapy alone.  Surgical options were discussed at length including conventional surgical aortic valve replacement through either a full median sternotomy or using minimally invasive techniques.  Other alternatives including rapid-deployment bioprosthetic tissue valve replacement, transcatheter aortic valve replacement, patch enlargement of the aortic root, stentless porcine aortic root replacement, valve repair, the Ross autograft procedure, and homograft aortic root replacement were discussed.  Discussion was held comparing the relative risks of mechanical valve replacement with need for lifelong anticoagulation versus use of a bioprosthetic tissue valve and the associated potential for late structural valve deterioration and failure.  This discussion was placed in the context of the patient's particular circumstances, and as a result the patient specifically requests that their valve be replaced using a bioprosthetic tissue valve .  The patient understands and accepts all potential associated risks of surgery including but not limited to risk of death, stroke, myocardial infarction, congestive heart failure, respiratory failure, renal failure, pneumonia, bleeding requiring blood transfusion and or reexploration, arrhythmia, heart block or bradycardia requiring permanent pacemaker, aortic dissection or other major vascular complication, pleural effusions or other delayed complications related to continued congestive heart failure, and other late complications related to valve replacement including structural valve deterioration and failure, thrombosis, endocarditis, or paravalvular leak.  The relative risks and benefits of performing a maze procedure with clipping of  the LA appendage at the time of their surgery was discussed at length, including the expected likelihood of long term freedom from recurrent symptomatic atrial fibrillation and/or atrial flutter.   We plan to proceed with surgery on Thursday, 01/27/2016. The patient has been instructed to stop taking Eliquis 7 days prior to surgery. All of his questions have been addressed.    Valentina Gu. Roxy Manns, MD 01/10/2016 11:56 AM

## 2016-01-26 NOTE — Progress Notes (Signed)
Anesthesia Chart Review: Patient is a 75 year old male scheduled for AV replacement, MAZE procedure, clipping of LA appendage on 01/27/16 by Dr. Roxy Manns.  See Dr. Guy Sandifer 01/10/16 office visit regarding details pertaining to history and pertinent cardiac testing. Cath showed severe AS, but non-osbtructive CAD.   PCP is Dr. [name]. Cardiologist is Dr. Lovena Le.   Meds include Eliquis, Tikosyn, Lasix, magnesium oxide, metformin, Ditropan. He reported being instructed to hold Eliquis 01/20/16.  BP (!) 154/61   Pulse 76   Temp 36.4 C   Resp 18   Ht 6\' 2"  (1.88 m)   Wt 260 lb (117.9 kg)   SpO2 100%   BMI 33.38 kg/m   01/25/16 PFTs: FVC 3.41 (70%), FEV1 2.49 (71%), DLCO unc 22.82 (60%).  01/25/16 Carotid U/S: Summary: Findings are consistent with a 1-39 percent stenosis involving the right internal carotid artery and the left internal carotid artery.  Preoperative labs noted. Cr 1.50, stable when compared to labs since at least 03/2015. A1c 6.4.   If no acute changes then anticipate that he can proceed as planned.  George Hugh [facility] Short Stay Center/Anesthesiology Phone [phone] 01/26/2016 11:34 AM

## 2016-01-26 NOTE — Anesthesia Preprocedure Evaluation (Signed)
Anesthesia Evaluation  Patient identified by MRN, date of birth, ID band Patient awake    Reviewed: Allergy & Precautions, NPO status , Patient's Chart, lab work & pertinent test results  History of Anesthesia Complications Negative for: history of anesthetic complications  Airway Mallampati: III  TM Distance: >3 FB Neck ROM: Full    Dental  (+) Teeth Intact, Dental Advisory Given   Pulmonary neg pulmonary ROS,    Pulmonary exam normal breath sounds clear to auscultation       Cardiovascular hypertension, Pt. on medications (-) angina+ CAD and +CHF  Normal cardiovascular examValvular problems/murmurs: moderate AS. AS  Rhythm:Irregular Rate:Bradycardia     Neuro/Psych  Neuromuscular disease negative psych ROS   GI/Hepatic Neg liver ROS, GERD  Medicated,  Endo/Other  diabetes, Type 2, Oral Hypoglycemic AgentsObesity   Renal/GU Renal InsufficiencyRenal disease     Musculoskeletal  (+) Arthritis , Osteoarthritis,    Abdominal   Peds  Hematology negative hematology ROS (+)   Anesthesia Other Findings Day of surgery medications reviewed with the patient.  Reproductive/Obstetrics                             Anesthesia Physical  Anesthesia Plan  ASA: IV  Anesthesia Plan: General   Post-op Pain Management:    Induction: Intravenous  Airway Management Planned: Oral ETT  Additional Equipment: PA Cath, Ultrasound Guidance Line Placement, CVP, Arterial line and TEE  Intra-op Plan:   Post-operative Plan: Post-operative intubation/ventilation  Informed Consent: I have reviewed the patients History and Physical, chart, labs and discussed the procedure including the risks, benefits and alternatives for the proposed anesthesia with the patient or authorized representative who has indicated his/her understanding and acceptance.   Dental advisory given  Plan Discussed with:  CRNA  Anesthesia Plan Comments:         Anesthesia Quick Evaluation

## 2016-01-27 ENCOUNTER — Inpatient Hospital Stay (HOSPITAL_COMMUNITY): Payer: PPO | Admitting: Anesthesiology

## 2016-01-27 ENCOUNTER — Encounter (HOSPITAL_COMMUNITY): Payer: Self-pay | Admitting: Thoracic Surgery (Cardiothoracic Vascular Surgery)

## 2016-01-27 ENCOUNTER — Inpatient Hospital Stay (HOSPITAL_COMMUNITY): Payer: PPO

## 2016-01-27 ENCOUNTER — Inpatient Hospital Stay (HOSPITAL_COMMUNITY)
Admission: RE | Admit: 2016-01-27 | Discharge: 2016-02-02 | DRG: 220 | Disposition: A | Discharge status: Home Health | Payer: PPO | Source: Ambulatory Visit | Attending: Thoracic Surgery (Cardiothoracic Vascular Surgery) | Admitting: Thoracic Surgery (Cardiothoracic Vascular Surgery)

## 2016-01-27 ENCOUNTER — Encounter (HOSPITAL_COMMUNITY)
Admission: RE | Disposition: A | Discharge status: Home Health | Payer: Self-pay | Source: Ambulatory Visit | Attending: Thoracic Surgery (Cardiothoracic Vascular Surgery)

## 2016-01-27 DIAGNOSIS — Z9842 Cataract extraction status, left eye: Secondary | ICD-10-CM

## 2016-01-27 DIAGNOSIS — Q211 Atrial septal defect: Secondary | ICD-10-CM

## 2016-01-27 DIAGNOSIS — I10 Essential (primary) hypertension: Secondary | ICD-10-CM | POA: Diagnosis present

## 2016-01-27 DIAGNOSIS — I4891 Unspecified atrial fibrillation: Secondary | ICD-10-CM

## 2016-01-27 DIAGNOSIS — I481 Persistent atrial fibrillation: Secondary | ICD-10-CM | POA: Diagnosis not present

## 2016-01-27 DIAGNOSIS — I080 Rheumatic disorders of both mitral and aortic valves: Secondary | ICD-10-CM | POA: Diagnosis not present

## 2016-01-27 DIAGNOSIS — N179 Acute kidney failure, unspecified: Secondary | ICD-10-CM | POA: Diagnosis not present

## 2016-01-27 DIAGNOSIS — I350 Nonrheumatic aortic (valve) stenosis: Secondary | ICD-10-CM

## 2016-01-27 DIAGNOSIS — Z7984 Long term (current) use of oral hypoglycemic drugs: Secondary | ICD-10-CM

## 2016-01-27 DIAGNOSIS — K219 Gastro-esophageal reflux disease without esophagitis: Secondary | ICD-10-CM | POA: Diagnosis not present

## 2016-01-27 DIAGNOSIS — E11319 Type 2 diabetes mellitus with unspecified diabetic retinopathy without macular edema: Secondary | ICD-10-CM | POA: Diagnosis not present

## 2016-01-27 DIAGNOSIS — Z7901 Long term (current) use of anticoagulants: Secondary | ICD-10-CM

## 2016-01-27 DIAGNOSIS — J9811 Atelectasis: Secondary | ICD-10-CM

## 2016-01-27 DIAGNOSIS — K8020 Calculus of gallbladder without cholecystitis without obstruction: Secondary | ICD-10-CM | POA: Diagnosis present

## 2016-01-27 DIAGNOSIS — D6959 Other secondary thrombocytopenia: Secondary | ICD-10-CM | POA: Diagnosis not present

## 2016-01-27 DIAGNOSIS — I130 Hypertensive heart and chronic kidney disease with heart failure and stage 1 through stage 4 chronic kidney disease, or unspecified chronic kidney disease: Secondary | ICD-10-CM | POA: Diagnosis not present

## 2016-01-27 DIAGNOSIS — D62 Acute posthemorrhagic anemia: Secondary | ICD-10-CM | POA: Diagnosis not present

## 2016-01-27 DIAGNOSIS — Z96641 Presence of right artificial hip joint: Secondary | ICD-10-CM | POA: Diagnosis present

## 2016-01-27 DIAGNOSIS — Z9841 Cataract extraction status, right eye: Secondary | ICD-10-CM

## 2016-01-27 DIAGNOSIS — R251 Tremor, unspecified: Secondary | ICD-10-CM | POA: Diagnosis present

## 2016-01-27 DIAGNOSIS — I482 Chronic atrial fibrillation: Secondary | ICD-10-CM | POA: Diagnosis not present

## 2016-01-27 DIAGNOSIS — M169 Osteoarthritis of hip, unspecified: Secondary | ICD-10-CM | POA: Diagnosis present

## 2016-01-27 DIAGNOSIS — E1122 Type 2 diabetes mellitus with diabetic chronic kidney disease: Secondary | ICD-10-CM | POA: Diagnosis present

## 2016-01-27 DIAGNOSIS — Z9889 Other specified postprocedural states: Secondary | ICD-10-CM

## 2016-01-27 DIAGNOSIS — I440 Atrioventricular block, first degree: Secondary | ICD-10-CM | POA: Diagnosis not present

## 2016-01-27 DIAGNOSIS — E1142 Type 2 diabetes mellitus with diabetic polyneuropathy: Secondary | ICD-10-CM | POA: Diagnosis not present

## 2016-01-27 DIAGNOSIS — N183 Chronic kidney disease, stage 3 (moderate): Secondary | ICD-10-CM | POA: Diagnosis not present

## 2016-01-27 DIAGNOSIS — E785 Hyperlipidemia, unspecified: Secondary | ICD-10-CM | POA: Diagnosis present

## 2016-01-27 DIAGNOSIS — I5032 Chronic diastolic (congestive) heart failure: Secondary | ICD-10-CM | POA: Diagnosis present

## 2016-01-27 DIAGNOSIS — Z953 Presence of xenogenic heart valve: Secondary | ICD-10-CM

## 2016-01-27 DIAGNOSIS — E669 Obesity, unspecified: Secondary | ICD-10-CM | POA: Diagnosis not present

## 2016-01-27 DIAGNOSIS — Z6836 Body mass index (BMI) 36.0-36.9, adult: Secondary | ICD-10-CM | POA: Diagnosis not present

## 2016-01-27 DIAGNOSIS — I509 Heart failure, unspecified: Secondary | ICD-10-CM | POA: Diagnosis not present

## 2016-01-27 DIAGNOSIS — E113599 Type 2 diabetes mellitus with proliferative diabetic retinopathy without macular edema, unspecified eye: Secondary | ICD-10-CM | POA: Diagnosis present

## 2016-01-27 DIAGNOSIS — I2510 Atherosclerotic heart disease of native coronary artery without angina pectoris: Secondary | ICD-10-CM | POA: Diagnosis present

## 2016-01-27 DIAGNOSIS — Z8679 Personal history of other diseases of the circulatory system: Secondary | ICD-10-CM

## 2016-01-27 DIAGNOSIS — Z23 Encounter for immunization: Secondary | ICD-10-CM

## 2016-01-27 DIAGNOSIS — N182 Chronic kidney disease, stage 2 (mild): Secondary | ICD-10-CM | POA: Diagnosis present

## 2016-01-27 DIAGNOSIS — E663 Overweight: Secondary | ICD-10-CM | POA: Diagnosis present

## 2016-01-27 DIAGNOSIS — I358 Other nonrheumatic aortic valve disorders: Secondary | ICD-10-CM | POA: Diagnosis not present

## 2016-01-27 HISTORY — PX: TEE WITHOUT CARDIOVERSION: SHX5443

## 2016-01-27 HISTORY — PX: MAZE: SHX5063

## 2016-01-27 HISTORY — DX: Presence of xenogenic heart valve: Z95.3

## 2016-01-27 HISTORY — DX: Other specified postprocedural states: Z98.890

## 2016-01-27 HISTORY — DX: Personal history of other diseases of the circulatory system: Z86.79

## 2016-01-27 HISTORY — PX: AORTIC VALVE REPLACEMENT: SHX41

## 2016-01-27 LAB — POCT I-STAT, CHEM 8
BUN: 17 mg/dL (ref 6–20)
BUN: 16 mg/dL (ref 6–20)
BUN: 16 mg/dL (ref 6–20)
BUN: 17 mg/dL (ref 6–20)
BUN: 19 mg/dL (ref 6–20)
BUN: 20 mg/dL (ref 6–20)
BUN: 21 mg/dL — AB (ref 6–20)
CALCIUM ION: 1.29 mmol/L (ref 1.15–1.40)
CALCIUM ION: 1.29 mmol/L (ref 1.15–1.40)
CALCIUM ION: 1.18 mmol/L (ref 1.15–1.40)
CALCIUM ION: 1.23 mmol/L (ref 1.15–1.40)
CHLORIDE: 103 mmol/L (ref 101–111)
CHLORIDE: 102 mmol/L (ref 101–111)
CHLORIDE: 100 mmol/L — AB (ref 101–111)
CHLORIDE: 98 mmol/L — AB (ref 101–111)
CHLORIDE: 98 mmol/L — AB (ref 101–111)
CHLORIDE: 101 mmol/L (ref 101–111)
CREATININE: 1.1 mg/dL (ref 0.61–1.24)
Calcium, Ion: 1.18 mmol/L (ref 1.15–1.40)
Calcium, Ion: 1.17 mmol/L (ref 1.15–1.40)
Calcium, Ion: 1.18 mmol/L (ref 1.15–1.40)
Chloride: 103 mmol/L (ref 101–111)
Creatinine, Ser: 1.1 mg/dL (ref 0.61–1.24)
Creatinine, Ser: 1 mg/dL (ref 0.61–1.24)
Creatinine, Ser: 1.1 mg/dL (ref 0.61–1.24)
Creatinine, Ser: 1.1 mg/dL (ref 0.61–1.24)
Creatinine, Ser: 1 mg/dL (ref 0.61–1.24)
Creatinine, Ser: 1.3 mg/dL — ABNORMAL HIGH (ref 0.61–1.24)
GLUCOSE: 96 mg/dL (ref 65–99)
GLUCOSE: 86 mg/dL (ref 65–99)
GLUCOSE: 86 mg/dL (ref 65–99)
Glucose, Bld: 111 mg/dL — ABNORMAL HIGH (ref 65–99)
Glucose, Bld: 117 mg/dL — ABNORMAL HIGH (ref 65–99)
Glucose, Bld: 142 mg/dL — ABNORMAL HIGH (ref 65–99)
Glucose, Bld: 121 mg/dL — ABNORMAL HIGH (ref 65–99)
HCT: 36 % — ABNORMAL LOW (ref 39.0–52.0)
HCT: 32 % — ABNORMAL LOW (ref 39.0–52.0)
HCT: 27 % — ABNORMAL LOW (ref 39.0–52.0)
HEMATOCRIT: 29 % — AB (ref 39.0–52.0)
HEMATOCRIT: 28 % — AB (ref 39.0–52.0)
HEMATOCRIT: 29 % — AB (ref 39.0–52.0)
HEMATOCRIT: 29 % — AB (ref 39.0–52.0)
HEMOGLOBIN: 10.9 g/dL — AB (ref 13.0–17.0)
HEMOGLOBIN: 9.9 g/dL — AB (ref 13.0–17.0)
HEMOGLOBIN: 9.9 g/dL — AB (ref 13.0–17.0)
Hemoglobin: 12.2 g/dL — ABNORMAL LOW (ref 13.0–17.0)
Hemoglobin: 9.2 g/dL — ABNORMAL LOW (ref 13.0–17.0)
Hemoglobin: 9.5 g/dL — ABNORMAL LOW (ref 13.0–17.0)
Hemoglobin: 9.9 g/dL — ABNORMAL LOW (ref 13.0–17.0)
POTASSIUM: 4.7 mmol/L (ref 3.5–5.1)
POTASSIUM: 5.3 mmol/L — AB (ref 3.5–5.1)
POTASSIUM: 5.6 mmol/L — AB (ref 3.5–5.1)
POTASSIUM: 5.9 mmol/L — AB (ref 3.5–5.1)
POTASSIUM: 5.6 mmol/L — AB (ref 3.5–5.1)
Potassium: 4.6 mmol/L (ref 3.5–5.1)
Potassium: 5.4 mmol/L — ABNORMAL HIGH (ref 3.5–5.1)
SODIUM: 139 mmol/L (ref 135–145)
SODIUM: 137 mmol/L (ref 135–145)
SODIUM: 133 mmol/L — AB (ref 135–145)
SODIUM: 134 mmol/L — AB (ref 135–145)
SODIUM: 134 mmol/L — AB (ref 135–145)
SODIUM: 136 mmol/L (ref 135–145)
Sodium: 139 mmol/L (ref 135–145)
TCO2: 27 mmol/L (ref 0–100)
TCO2: 27 mmol/L (ref 0–100)
TCO2: 27 mmol/L (ref 0–100)
TCO2: 26 mmol/L (ref 0–100)
TCO2: 26 mmol/L (ref 0–100)
TCO2: 27 mmol/L (ref 0–100)
TCO2: 20 mmol/L (ref 0–100)

## 2016-01-27 LAB — POCT I-STAT 3, ART BLOOD GAS (G3+)
ACID-BASE DEFICIT: 5 mmol/L — AB (ref 0.0–2.0)
Acid-Base Excess: 2 mmol/L (ref 0.0–2.0)
Acid-base deficit: 2 mmol/L (ref 0.0–2.0)
BICARBONATE: 26.4 mmol/L (ref 20.0–28.0)
Bicarbonate: 24.6 mmol/L (ref 20.0–28.0)
Bicarbonate: 20.7 mmol/L (ref 20.0–28.0)
O2 SAT: 96 %
O2 Saturation: 100 %
O2 Saturation: 100 %
PCO2 ART: 48.4 mmHg — AB (ref 32.0–48.0)
PCO2 ART: 39.9 mmHg (ref 32.0–48.0)
PH ART: 7.313 — AB (ref 7.350–7.450)
PH ART: 7.43 (ref 7.350–7.450)
TCO2: 26 mmol/L (ref 0–100)
TCO2: 28 mmol/L (ref 0–100)
TCO2: 22 mmol/L (ref 0–100)
pCO2 arterial: 40.1 mmHg (ref 32.0–48.0)
pH, Arterial: 7.315 — ABNORMAL LOW (ref 7.350–7.450)
pO2, Arterial: 472 mmHg — ABNORMAL HIGH (ref 83.0–108.0)
pO2, Arterial: 451 mmHg — ABNORMAL HIGH (ref 83.0–108.0)
pO2, Arterial: 83 mmHg (ref 83.0–108.0)

## 2016-01-27 LAB — CBC
HCT: 31 % — ABNORMAL LOW (ref 39.0–52.0)
HEMATOCRIT: 35.2 % — AB (ref 39.0–52.0)
HEMOGLOBIN: 11.4 g/dL — AB (ref 13.0–17.0)
Hemoglobin: 9.8 g/dL — ABNORMAL LOW (ref 13.0–17.0)
MCH: 32.9 pg (ref 26.0–34.0)
MCH: 32 pg (ref 26.0–34.0)
MCHC: 32.4 g/dL (ref 30.0–36.0)
MCHC: 31.6 g/dL (ref 30.0–36.0)
MCV: 101.7 fL — AB (ref 78.0–100.0)
MCV: 101.3 fL — ABNORMAL HIGH (ref 78.0–100.0)
PLATELETS: 129 10*3/uL — AB (ref 150–400)
Platelets: 84 10*3/uL — ABNORMAL LOW (ref 150–400)
RBC: 3.46 MIL/uL — AB (ref 4.22–5.81)
RBC: 3.06 MIL/uL — AB (ref 4.22–5.81)
RDW: 13.8 % (ref 11.5–15.5)
RDW: 13.8 % (ref 11.5–15.5)
WBC: 8.1 10*3/uL (ref 4.0–10.5)
WBC: 10.2 10*3/uL (ref 4.0–10.5)

## 2016-01-27 LAB — APTT: aPTT: 45 seconds — ABNORMAL HIGH (ref 24–36)

## 2016-01-27 LAB — CREATININE, SERUM
CREATININE: 1.41 mg/dL — AB (ref 0.61–1.24)
GFR, EST AFRICAN AMERICAN: 54 mL/min — AB (ref >60)
GFR, EST NON AFRICAN AMERICAN: 47 mL/min — AB (ref >60)

## 2016-01-27 LAB — GLUCOSE, CAPILLARY
GLUCOSE-CAPILLARY: 96 mg/dL (ref 65–99)
GLUCOSE-CAPILLARY: 102 mg/dL — AB (ref 65–99)
GLUCOSE-CAPILLARY: 102 mg/dL — AB (ref 65–99)
GLUCOSE-CAPILLARY: 117 mg/dL — AB (ref 65–99)
GLUCOSE-CAPILLARY: 124 mg/dL — AB (ref 65–99)
Glucose-Capillary: 120 mg/dL — ABNORMAL HIGH (ref 65–99)
Glucose-Capillary: 110 mg/dL — ABNORMAL HIGH (ref 65–99)
Glucose-Capillary: 112 mg/dL — ABNORMAL HIGH (ref 65–99)
Glucose-Capillary: 117 mg/dL — ABNORMAL HIGH (ref 65–99)

## 2016-01-27 LAB — MAGNESIUM: Magnesium: 2.6 mg/dL — ABNORMAL HIGH (ref 1.7–2.4)

## 2016-01-27 LAB — PROTIME-INR
INR: 1.42
Prothrombin Time: 17.5 seconds — ABNORMAL HIGH (ref 11.4–15.2)

## 2016-01-27 LAB — HEMOGLOBIN AND HEMATOCRIT, BLOOD
HCT: 26.7 % — ABNORMAL LOW (ref 39.0–52.0)
HEMOGLOBIN: 8.6 g/dL — AB (ref 13.0–17.0)

## 2016-01-27 LAB — POCT I-STAT 4, (NA,K, GLUC, HGB,HCT)
GLUCOSE: 126 mg/dL — AB (ref 65–99)
HEMATOCRIT: 33 % — AB (ref 39.0–52.0)
HEMOGLOBIN: 11.2 g/dL — AB (ref 13.0–17.0)
POTASSIUM: 5.4 mmol/L — AB (ref 3.5–5.1)
SODIUM: 136 mmol/L (ref 135–145)

## 2016-01-27 SURGERY — REPLACEMENT, AORTIC VALVE, OPEN
Anesthesia: General | Site: Chest

## 2016-01-27 MED ORDER — LACTATED RINGERS IV SOLN: INTRAVENOUS | Status: DC

## 2016-01-27 MED ORDER — SODIUM CHLORIDE 0.9 % IV SOLN
INTRAVENOUS | Status: DC
  Administered 2016-01-27: 16:00:00 via INTRAVENOUS

## 2016-01-27 MED ORDER — MIDAZOLAM HCL 10 MG/2ML IJ SOLN
INTRAMUSCULAR | Status: AC
  Filled 2016-01-27: qty 2

## 2016-01-27 MED ORDER — PHENYLEPHRINE HCL 10 MG/ML IJ SOLN
INTRAMUSCULAR | Status: DC | PRN
  Administered 2016-01-27: 40 ug via INTRAVENOUS
  Administered 2016-01-27: 15 ug via INTRAVENOUS
  Administered 2016-01-27: 40 ug via INTRAVENOUS

## 2016-01-27 MED ORDER — POTASSIUM CHLORIDE 10 MEQ/50ML IV SOLN: 10.0000 meq | INTRAVENOUS | Status: AC

## 2016-01-27 MED ORDER — MIDAZOLAM HCL 2 MG/2ML IJ SOLN: 2.0000 mg | INTRAMUSCULAR | Status: DC | PRN

## 2016-01-27 MED ORDER — 0.9 % SODIUM CHLORIDE (POUR BTL) OPTIME
TOPICAL | Status: DC | PRN
  Administered 2016-01-27: 4000 mL

## 2016-01-27 MED ORDER — ASPIRIN 81 MG PO CHEW: 324.0000 mg | CHEWABLE_TABLET | Freq: Every day | ORAL | Status: DC

## 2016-01-27 MED ORDER — FENTANYL CITRATE (PF) 250 MCG/5ML IJ SOLN
INTRAMUSCULAR | Status: AC
Start: 2016-01-27 — End: 2016-01-27
  Filled 2016-01-27: qty 25

## 2016-01-27 MED ORDER — OXYCODONE HCL 5 MG PO TABS
5.0000 mg | ORAL_TABLET | ORAL | Status: DC | PRN
  Administered 2016-01-28: 10 mg via ORAL
  Filled 2016-01-27 (×2): qty 2

## 2016-01-27 MED ORDER — PROTAMINE SULFATE 10 MG/ML IV SOLN
INTRAVENOUS | Status: AC
  Filled 2016-01-27: qty 15

## 2016-01-27 MED ORDER — PROTAMINE SULFATE 10 MG/ML IV SOLN
INTRAVENOUS | Status: DC | PRN
  Administered 2016-01-27: 390 mg via INTRAVENOUS
  Administered 2016-01-27: 10 mg via INTRAVENOUS

## 2016-01-27 MED ORDER — SODIUM CHLORIDE 0.45 % IV SOLN
INTRAVENOUS | Status: DC | PRN
  Administered 2016-01-27: 16:00:00 via INTRAVENOUS

## 2016-01-27 MED ORDER — CEFUROXIME SODIUM 1.5 G IJ SOLR
1.5000 g | Freq: Two times a day (BID) | INTRAMUSCULAR | Status: AC
  Administered 2016-01-27 – 2016-01-29 (×4): 1.5 g via INTRAVENOUS
  Filled 2016-01-27 (×4): qty 1.5

## 2016-01-27 MED ORDER — TRAMADOL HCL 50 MG PO TABS: 50.0000 mg | ORAL_TABLET | ORAL | Status: DC | PRN

## 2016-01-27 MED ORDER — ALBUMIN HUMAN 5 % IV SOLN
250.0000 mL | INTRAVENOUS | Status: AC | PRN
  Administered 2016-01-27 (×4): 250 mL via INTRAVENOUS
  Filled 2016-01-27 (×2): qty 250

## 2016-01-27 MED ORDER — ACETAMINOPHEN 500 MG PO TABS
1000.0000 mg | ORAL_TABLET | Freq: Four times a day (QID) | ORAL | Status: AC
  Administered 2016-01-28 – 2016-02-01 (×18): 1000 mg via ORAL
  Filled 2016-01-27 (×18): qty 2

## 2016-01-27 MED ORDER — SODIUM CHLORIDE 0.9 % IR SOLN
Status: DC | PRN
  Administered 2016-01-27: 3000 mL

## 2016-01-27 MED ORDER — SUCCINYLCHOLINE CHLORIDE 200 MG/10ML IV SOSY
PREFILLED_SYRINGE | INTRAVENOUS | Status: AC
  Filled 2016-01-27: qty 10

## 2016-01-27 MED ORDER — METOPROLOL TARTRATE 25 MG/10 ML ORAL SUSPENSION
12.5000 mg | Freq: Two times a day (BID) | ORAL | Status: DC
Start: 2016-01-27 — End: 2016-01-28

## 2016-01-27 MED ORDER — PHENYLEPHRINE HCL 10 MG/ML IJ SOLN
0.0000 ug/min | INTRAVENOUS | Status: DC
  Administered 2016-01-27: 10 ug/min via INTRAVENOUS
  Administered 2016-01-28: 50 ug/min via INTRAVENOUS
  Filled 2016-01-27 (×2): qty 2

## 2016-01-27 MED ORDER — ROCURONIUM BROMIDE 10 MG/ML (PF) SYRINGE
PREFILLED_SYRINGE | INTRAVENOUS | Status: AC
  Filled 2016-01-27: qty 40

## 2016-01-27 MED ORDER — METOPROLOL TARTRATE 5 MG/5ML IV SOLN: 2.5000 mg | INTRAVENOUS | Status: DC | PRN

## 2016-01-27 MED ORDER — MORPHINE SULFATE (PF) 2 MG/ML IV SOLN: 2.0000 mg | INTRAVENOUS | Status: DC | PRN

## 2016-01-27 MED ORDER — MAGNESIUM SULFATE 4 GM/100ML IV SOLN
4.0000 g | Freq: Once | INTRAVENOUS | Status: AC
  Administered 2016-01-27: 4 g via INTRAVENOUS
  Filled 2016-01-27: qty 100

## 2016-01-27 MED ORDER — NITROGLYCERIN IN D5W 200-5 MCG/ML-% IV SOLN: 0.0000 ug/min | INTRAVENOUS | Status: DC

## 2016-01-27 MED ORDER — SODIUM CHLORIDE 0.9 % IV SOLN
INTRAVENOUS | Status: DC
  Administered 2016-01-27: 23:00:00 via INTRAVENOUS

## 2016-01-27 MED ORDER — ACETAMINOPHEN 160 MG/5ML PO SOLN: 1000.0000 mg | Freq: Four times a day (QID) | ORAL | Status: DC

## 2016-01-27 MED ORDER — BISACODYL 5 MG PO TBEC
10.0000 mg | DELAYED_RELEASE_TABLET | Freq: Every day | ORAL | Status: DC
  Administered 2016-01-28 – 2016-02-01 (×4): 10 mg via ORAL
  Filled 2016-01-27 (×4): qty 2

## 2016-01-27 MED ORDER — SODIUM CHLORIDE 0.9% FLUSH: 3.0000 mL | INTRAVENOUS | Status: DC | PRN

## 2016-01-27 MED ORDER — MORPHINE SULFATE (PF) 2 MG/ML IV SOLN: 1.0000 mg | INTRAVENOUS | Status: DC | PRN

## 2016-01-27 MED ORDER — CHLORHEXIDINE GLUCONATE 4 % EX LIQD: 30.0000 mL | CUTANEOUS | Status: DC

## 2016-01-27 MED ORDER — ROCURONIUM BROMIDE 10 MG/ML (PF) SYRINGE
PREFILLED_SYRINGE | INTRAVENOUS | Status: DC | PRN
Start: 2016-01-27 — End: 2016-01-27

## 2016-01-27 MED ORDER — DOFETILIDE 250 MCG PO CAPS
250.0000 ug | ORAL_CAPSULE | Freq: Two times a day (BID) | ORAL | Status: DC
  Filled 2016-01-27 (×2): qty 1

## 2016-01-27 MED ORDER — ACETAMINOPHEN 650 MG RE SUPP
650.0000 mg | Freq: Once | RECTAL | Status: AC
  Administered 2016-01-27: 650 mg via RECTAL

## 2016-01-27 MED ORDER — ALBUMIN HUMAN 5 % IV SOLN
INTRAVENOUS | Status: DC | PRN
  Administered 2016-01-27: 13:00:00 via INTRAVENOUS

## 2016-01-27 MED ORDER — SODIUM CHLORIDE 0.9 % IJ SOLN
INTRAMUSCULAR | Status: AC
  Filled 2016-01-27: qty 10

## 2016-01-27 MED ORDER — GELATIN ABSORBABLE MT POWD
OROMUCOSAL | Status: DC | PRN
  Administered 2016-01-27: 4 mL via TOPICAL
  Administered 2016-01-27: 16 mL via TOPICAL

## 2016-01-27 MED ORDER — METOPROLOL TARTRATE 12.5 MG HALF TABLET: 12.5000 mg | ORAL_TABLET | Freq: Two times a day (BID) | ORAL | Status: DC

## 2016-01-27 MED ORDER — ORAL CARE MOUTH RINSE
15.0000 mL | OROMUCOSAL | Status: DC
  Administered 2016-01-27 – 2016-01-28 (×4): 15 mL via OROMUCOSAL

## 2016-01-27 MED ORDER — ONDANSETRON HCL 4 MG/2ML IJ SOLN: 4.0000 mg | Freq: Four times a day (QID) | INTRAMUSCULAR | Status: DC | PRN

## 2016-01-27 MED ORDER — EPHEDRINE 5 MG/ML INJ
INTRAVENOUS | Status: AC
  Filled 2016-01-27: qty 10

## 2016-01-27 MED ORDER — ARTIFICIAL TEARS OP OINT
TOPICAL_OINTMENT | OPHTHALMIC | Status: AC
  Filled 2016-01-27: qty 3.5

## 2016-01-27 MED ORDER — VANCOMYCIN HCL IN DEXTROSE 1-5 GM/200ML-% IV SOLN
1000.0000 mg | Freq: Once | INTRAVENOUS | Status: AC
Start: 2016-01-27 — End: 2016-01-27
  Administered 2016-01-27: 1000 mg via INTRAVENOUS
  Filled 2016-01-27 (×2): qty 200

## 2016-01-27 MED ORDER — PROTAMINE SULFATE 10 MG/ML IV SOLN
INTRAVENOUS | Status: AC
Start: 2016-01-27 — End: 2016-01-27
  Filled 2016-01-27: qty 25

## 2016-01-27 MED ORDER — CHLORHEXIDINE GLUCONATE 0.12 % MT SOLN
OROMUCOSAL | Status: AC
  Filled 2016-01-27: qty 15

## 2016-01-27 MED ORDER — DOCUSATE SODIUM 100 MG PO CAPS
200.0000 mg | ORAL_CAPSULE | Freq: Every day | ORAL | Status: DC
  Administered 2016-01-28 – 2016-02-02 (×6): 200 mg via ORAL
  Filled 2016-01-27 (×6): qty 2

## 2016-01-27 MED ORDER — HEPARIN SODIUM (PORCINE) 1000 UNIT/ML IJ SOLN
INTRAMUSCULAR | Status: AC
  Filled 2016-01-27: qty 1

## 2016-01-27 MED ORDER — INSULIN REGULAR BOLUS VIA INFUSION
0.0000 [IU] | Freq: Three times a day (TID) | INTRAVENOUS | Status: DC
Start: 2016-01-27 — End: 2016-01-28
  Filled 2016-01-27: qty 10

## 2016-01-27 MED ORDER — MIDAZOLAM HCL 5 MG/5ML IJ SOLN
INTRAMUSCULAR | Status: DC | PRN
  Administered 2016-01-27: 2 mg via INTRAVENOUS
  Administered 2016-01-27 (×2): 1 mg via INTRAVENOUS
  Administered 2016-01-27: 2 mg via INTRAVENOUS
  Administered 2016-01-27: 3 mg via INTRAVENOUS
  Administered 2016-01-27 (×3): 1 mg via INTRAVENOUS

## 2016-01-27 MED ORDER — SODIUM CHLORIDE 0.9 % IV SOLN
Freq: Once | INTRAVENOUS | Status: AC
  Administered 2016-01-27: 20:00:00 via INTRAVENOUS

## 2016-01-27 MED ORDER — ASPIRIN EC 325 MG PO TBEC
325.0000 mg | DELAYED_RELEASE_TABLET | Freq: Every day | ORAL | Status: DC
  Administered 2016-01-28 – 2016-01-31 (×4): 325 mg via ORAL
  Filled 2016-01-27 (×5): qty 1

## 2016-01-27 MED ORDER — LACTATED RINGERS IV SOLN
INTRAVENOUS | Status: DC | PRN
  Administered 2016-01-27: 07:00:00 via INTRAVENOUS

## 2016-01-27 MED ORDER — BISACODYL 10 MG RE SUPP: 10.0000 mg | Freq: Every day | RECTAL | Status: DC

## 2016-01-27 MED ORDER — ROCURONIUM BROMIDE 100 MG/10ML IV SOLN
INTRAVENOUS | Status: DC | PRN
  Administered 2016-01-27: 20 mg via INTRAVENOUS
  Administered 2016-01-27: 50 mg via INTRAVENOUS
  Administered 2016-01-27: 30 mg via INTRAVENOUS
  Administered 2016-01-27: 100 mg via INTRAVENOUS
  Administered 2016-01-27 (×2): 50 mg via INTRAVENOUS

## 2016-01-27 MED ORDER — HEPARIN SODIUM (PORCINE) 1000 UNIT/ML IJ SOLN
INTRAMUSCULAR | Status: DC | PRN
  Administered 2016-01-27: 40000 [IU] via INTRAVENOUS

## 2016-01-27 MED ORDER — FENTANYL CITRATE (PF) 250 MCG/5ML IJ SOLN
INTRAMUSCULAR | Status: AC
  Filled 2016-01-27: qty 10

## 2016-01-27 MED ORDER — PROPOFOL 10 MG/ML IV BOLUS
INTRAVENOUS | Status: DC | PRN
  Administered 2016-01-27: 30 mg via INTRAVENOUS
  Administered 2016-01-27: 40 mg via INTRAVENOUS

## 2016-01-27 MED ORDER — PHENYLEPHRINE 40 MCG/ML (10ML) SYRINGE FOR IV PUSH (FOR BLOOD PRESSURE SUPPORT)
PREFILLED_SYRINGE | INTRAVENOUS | Status: AC
  Filled 2016-01-27: qty 30

## 2016-01-27 MED ORDER — PROPOFOL 10 MG/ML IV BOLUS
INTRAVENOUS | Status: AC
  Filled 2016-01-27: qty 20

## 2016-01-27 MED ORDER — MIDAZOLAM HCL 2 MG/2ML IJ SOLN
INTRAMUSCULAR | Status: AC
  Filled 2016-01-27: qty 2

## 2016-01-27 MED ORDER — DEXMEDETOMIDINE HCL IN NACL 200 MCG/50ML IV SOLN
0.0000 ug/kg/h | INTRAVENOUS | Status: DC
  Administered 2016-01-27: 0.5 ug/kg/h via INTRAVENOUS
  Filled 2016-01-27 (×2): qty 50

## 2016-01-27 MED ORDER — FAMOTIDINE IN NACL 20-0.9 MG/50ML-% IV SOLN
20.0000 mg | Freq: Two times a day (BID) | INTRAVENOUS | Status: AC
  Administered 2016-01-27 (×2): 20 mg via INTRAVENOUS
  Filled 2016-01-27: qty 50

## 2016-01-27 MED ORDER — LACTATED RINGERS IV SOLN: 500.0000 mL | Freq: Once | INTRAVENOUS | Status: DC | PRN

## 2016-01-27 MED ORDER — CHLORHEXIDINE GLUCONATE 0.12 % MT SOLN
15.0000 mL | OROMUCOSAL | Status: AC
  Administered 2016-01-27: 16:00:00 via OROMUCOSAL

## 2016-01-27 MED ORDER — SODIUM CHLORIDE 0.9 % IV SOLN
INTRAVENOUS | Status: DC
  Administered 2016-01-27: 0.6 [IU]/h via INTRAVENOUS
  Administered 2016-01-27: 0.7 [IU]/h via INTRAVENOUS
  Filled 2016-01-27: qty 2.5

## 2016-01-27 MED ORDER — LACTATED RINGERS IV SOLN
INTRAVENOUS | Status: DC | PRN
  Administered 2016-01-27 (×2): via INTRAVENOUS

## 2016-01-27 MED ORDER — ACETAMINOPHEN 160 MG/5ML PO SOLN: 650.0000 mg | Freq: Once | ORAL | Status: AC

## 2016-01-27 MED ORDER — CHLORHEXIDINE GLUCONATE 0.12 % MT SOLN
15.0000 mL | Freq: Two times a day (BID) | OROMUCOSAL | Status: DC
  Administered 2016-01-27: 15 mL via OROMUCOSAL

## 2016-01-27 MED ORDER — LACTATED RINGERS IV SOLN
INTRAVENOUS | Status: DC | PRN
  Administered 2016-01-27 (×2): via INTRAVENOUS

## 2016-01-27 MED ORDER — SODIUM CHLORIDE 0.9% FLUSH
3.0000 mL | Freq: Two times a day (BID) | INTRAVENOUS | Status: DC
  Administered 2016-01-28 – 2016-02-02 (×10): 3 mL via INTRAVENOUS

## 2016-01-27 MED ORDER — PANTOPRAZOLE SODIUM 40 MG PO TBEC
40.0000 mg | DELAYED_RELEASE_TABLET | Freq: Every day | ORAL | Status: DC
  Administered 2016-01-29 – 2016-02-02 (×5): 40 mg via ORAL
  Filled 2016-01-27 (×5): qty 1

## 2016-01-27 MED ORDER — FENTANYL CITRATE (PF) 250 MCG/5ML IJ SOLN
INTRAMUSCULAR | Status: DC | PRN
  Administered 2016-01-27: 50 ug via INTRAVENOUS
  Administered 2016-01-27: 250 ug via INTRAVENOUS
  Administered 2016-01-27 (×2): 100 ug via INTRAVENOUS
  Administered 2016-01-27: 150 ug via INTRAVENOUS
  Administered 2016-01-27: 250 ug via INTRAVENOUS
  Administered 2016-01-27: 50 ug via INTRAVENOUS
  Administered 2016-01-27 (×2): 150 ug via INTRAVENOUS
  Administered 2016-01-27: 100 ug via INTRAVENOUS
  Administered 2016-01-27: 150 ug via INTRAVENOUS
  Administered 2016-01-27: 250 ug via INTRAVENOUS

## 2016-01-27 MED ORDER — SODIUM CHLORIDE 0.9 % IV SOLN
250.0000 mL | INTRAVENOUS | Status: DC
  Administered 2016-01-28: 19:00:00 via INTRAVENOUS

## 2016-01-27 MED FILL — Magnesium Sulfate Inj 50%: INTRAMUSCULAR | Qty: 10 | Status: AC

## 2016-01-27 MED FILL — Potassium Chloride Inj 2 mEq/ML: INTRAVENOUS | Qty: 40 | Status: AC

## 2016-01-27 MED FILL — Magnesium Sulfate Inj 50%: INTRAMUSCULAR | Qty: 2 | Status: AC

## 2016-01-27 MED FILL — Heparin Sodium (Porcine) Inj 1000 Unit/ML: INTRAMUSCULAR | Qty: 30 | Status: AC

## 2016-01-27 SURGICAL SUPPLY — 119 items
ADAPTER CARDIO PERF ANTE/RETRO (ADAPTER) ×6 IMPLANT
ADPR PRFSN 84XANTGRD RTRGD (ADAPTER) ×4
ARTICLIP LAA PROCLIP II 45 (Clip) ×3 IMPLANT
ATTRACTOMAT 16X20 MAGNETIC DRP (DRAPES) ×3 IMPLANT
BAG DECANTER FOR FLEXI CONT (MISCELLANEOUS) ×3 IMPLANT
BLADE STERNUM SYSTEM 6 (BLADE) ×6 IMPLANT
BLADE SURG 11 STRL SS (BLADE) ×6 IMPLANT
CANISTER SUCTION 2500CC (MISCELLANEOUS) ×6 IMPLANT
CANN PRFSN 3/8X14X24FR PCFC (MISCELLANEOUS)
CANN PRFSN 3/8XCNCT ST RT ANG (MISCELLANEOUS)
CANNULA AORTIC ROOT 9FR (CANNULA) ×1 IMPLANT
CANNULA EZ GLIDE 8.0 24FR (CANNULA) ×1 IMPLANT
CANNULA EZ GLIDE AORTIC 21FR (CANNULA) ×3 IMPLANT
CANNULA FEM VENOUS REMOTE 22FR (CANNULA) ×1 IMPLANT
CANNULA GUNDRY RCSP 15FR (MISCELLANEOUS) ×6 IMPLANT
CANNULA PRFSN 3/8X14X24FR PCFC (MISCELLANEOUS) IMPLANT
CANNULA PRFSN 3/8XCNCT RT ANG (MISCELLANEOUS) IMPLANT
CANNULA SUMP PERICARDIAL (CANNULA) ×1 IMPLANT
CANNULA VEN MTL TIP RT (MISCELLANEOUS)
CANNULA VENNOUS METAL TIP 20FR (CANNULA) ×1 IMPLANT
CATH HEART VENT LEFT (CATHETERS) ×2 IMPLANT
CATH THORACIC 28FR RT ANG (CATHETERS) IMPLANT
CATH THORACIC 36FR (CATHETERS) ×1 IMPLANT
CATH THORACIC 36FR RT ANG (CATHETERS) ×2 IMPLANT
CLAMP ISOLATOR SYNERGY LG (MISCELLANEOUS) ×3 IMPLANT
CLIP FOGARTY SPRING 6M (CLIP) IMPLANT
CONN 1/2X1/2X1/2  BEN (MISCELLANEOUS) ×2
CONN 1/2X1/2X1/2 BEN (MISCELLANEOUS) ×2 IMPLANT
CONN 3/8X1/2 ST GISH (MISCELLANEOUS) ×5 IMPLANT
CONN ST 1/4X3/8  BEN (MISCELLANEOUS) ×3
CONN ST 1/4X3/8 BEN (MISCELLANEOUS) IMPLANT
CONT SPEC 4OZ CLIKSEAL STRL BL (MISCELLANEOUS) ×1 IMPLANT
COVER SURGICAL LIGHT HANDLE (MISCELLANEOUS) ×6 IMPLANT
CRADLE DONUT ADULT HEAD (MISCELLANEOUS) ×6 IMPLANT
DEVICE ATRICLIP LAA PRCLPII 45 (Clip) IMPLANT
DEVICE SUT CK QUICK LOAD MINI (Prosthesis & Implant Heart) ×2 IMPLANT
DRAIN CHANNEL 32F RND 10.7 FF (WOUND CARE) ×6 IMPLANT
DRAPE BILATERAL SPLIT (DRAPES) ×1 IMPLANT
DRAPE CARDIOVASCULAR INCISE (DRAPES) ×3
DRAPE CV SPLIT W-CLR ANES SCRN (DRAPES) ×1 IMPLANT
DRAPE INCISE IOBAN 66X45 STRL (DRAPES) ×6 IMPLANT
DRAPE SLUSH/WARMER DISC (DRAPES) ×6 IMPLANT
DRAPE SRG 135X102X78XABS (DRAPES) IMPLANT
DRSG AQUACEL AG ADV 3.5X14 (GAUZE/BANDAGES/DRESSINGS) ×1 IMPLANT
DRSG COVADERM 4X14 (GAUZE/BANDAGES/DRESSINGS) ×6 IMPLANT
ELECT REM PT RETURN 9FT ADLT (ELECTROSURGICAL) ×12
ELECTRODE REM PT RTRN 9FT ADLT (ELECTROSURGICAL) ×8 IMPLANT
FELT TEFLON 1X6 (MISCELLANEOUS) ×6 IMPLANT
GAUZE SPONGE 4X4 12PLY STRL (GAUZE/BANDAGES/DRESSINGS) ×12 IMPLANT
GLOVE BIO SURGEON STRL SZ 6 (GLOVE) IMPLANT
GLOVE BIO SURGEON STRL SZ 6.5 (GLOVE) ×1 IMPLANT
GLOVE BIO SURGEON STRL SZ7 (GLOVE) ×1 IMPLANT
GLOVE BIO SURGEON STRL SZ7.5 (GLOVE) IMPLANT
GLOVE BIOGEL PI IND STRL 6.5 (GLOVE) IMPLANT
GLOVE BIOGEL PI INDICATOR 6.5 (GLOVE) ×5
GLOVE ECLIPSE 6.5 STRL STRAW (GLOVE) ×1 IMPLANT
GLOVE ORTHO TXT STRL SZ7.5 (GLOVE) ×9 IMPLANT
GOWN STRL REUS W/ TWL LRG LVL3 (GOWN DISPOSABLE) ×16 IMPLANT
GOWN STRL REUS W/TWL LRG LVL3 (GOWN DISPOSABLE) ×33
HEMOSTAT POWDER SURGIFOAM 1G (HEMOSTASIS) ×20 IMPLANT
INSERT FOGARTY XLG (MISCELLANEOUS) ×7 IMPLANT
KIT BASIN OR (CUSTOM PROCEDURE TRAY) ×6 IMPLANT
KIT DILATOR VASC 18G NDL (KITS) ×1 IMPLANT
KIT DRAINAGE VACCUM ASSIST (KITS) ×1 IMPLANT
KIT ROOM TURNOVER OR (KITS) ×6 IMPLANT
KIT SUCTION CATH 14FR (SUCTIONS) ×18 IMPLANT
KIT SUT CK MINI COMBO 4X17 (Prosthesis & Implant Heart) ×12 IMPLANT
LEAD PACING MYOCARDI (MISCELLANEOUS) ×3 IMPLANT
LINE VENT (MISCELLANEOUS) ×1 IMPLANT
LOOP VESSEL SUPERMAXI WHITE (MISCELLANEOUS) ×3 IMPLANT
NS IRRIG 1000ML POUR BTL (IV SOLUTION) ×30 IMPLANT
PACK OPEN HEART (CUSTOM PROCEDURE TRAY) ×6 IMPLANT
PAD ARMBOARD 7.5X6 YLW CONV (MISCELLANEOUS) ×12 IMPLANT
PROBE CRYO2-ABLATION MALLABLE (MISCELLANEOUS) ×1 IMPLANT
SET CARDIOPLEGIA MPS 5001102 (MISCELLANEOUS) ×1 IMPLANT
SET IRRIG TUBING LAPAROSCOPIC (IRRIGATION / IRRIGATOR) ×6 IMPLANT
SOLUTION ANTI FOG 6CC (MISCELLANEOUS) ×1 IMPLANT
SPONGE GAUZE 4X4 12PLY STER LF (GAUZE/BANDAGES/DRESSINGS) ×2 IMPLANT
SUCKER INTRACARDIAC WEIGHTED (SUCKER) ×3 IMPLANT
SUT BONE WAX W31G (SUTURE) ×6 IMPLANT
SUT ETHIBON 2 0 V 52N 30 (SUTURE) ×7 IMPLANT
SUT ETHIBON EXCEL 2-0 V-5 (SUTURE) IMPLANT
SUT ETHIBOND 2 0 SH (SUTURE) ×6
SUT ETHIBOND 2 0 SH 36X2 (SUTURE) ×4 IMPLANT
SUT ETHIBOND 2 0 V4 (SUTURE) IMPLANT
SUT ETHIBOND 2 0V4 GREEN (SUTURE) IMPLANT
SUT ETHIBOND 4 0 RB 1 (SUTURE) IMPLANT
SUT ETHIBOND V-5 VALVE (SUTURE) IMPLANT
SUT ETHIBOND X763 2 0 SH 1 (SUTURE) ×15 IMPLANT
SUT MNCRL AB 3-0 PS2 18 (SUTURE) ×12 IMPLANT
SUT PDS AB 1 CTX 36 (SUTURE) ×12 IMPLANT
SUT PROLENE 3 0 SH 1 (SUTURE) ×3 IMPLANT
SUT PROLENE 3 0 SH1 36 (SUTURE) ×5 IMPLANT
SUT PROLENE 4 0 RB 1 (SUTURE) ×24
SUT PROLENE 4 0 SH DA (SUTURE) ×9 IMPLANT
SUT PROLENE 4-0 RB1 .5 CRCL 36 (SUTURE) ×8 IMPLANT
SUT PROLENE 5 0 C 1 36 (SUTURE) IMPLANT
SUT PROLENE 6 0 C 1 30 (SUTURE) IMPLANT
SUT SILK  1 MH (SUTURE) ×5
SUT SILK 1 MH (SUTURE) ×4 IMPLANT
SUT SILK 2 0 SH CR/8 (SUTURE) IMPLANT
SUT SILK 3 0 SH CR/8 (SUTURE) IMPLANT
SUT STEEL 6MS V (SUTURE) IMPLANT
SUT STEEL STERNAL CCS#1 18IN (SUTURE) ×1 IMPLANT
SUT STEEL SZ 6 DBL 3X14 BALL (SUTURE) ×2 IMPLANT
SUT VIC AB 1 CTX 18 (SUTURE) ×1 IMPLANT
SUT VIC AB 2-0 CTX 27 (SUTURE) ×2 IMPLANT
SUTURE E-PAK OPEN HEART (SUTURE) ×3 IMPLANT
SYSTEM SAHARA CHEST DRAIN ATS (WOUND CARE) ×7 IMPLANT
TAPE CLOTH SURG 4X10 WHT LF (GAUZE/BANDAGES/DRESSINGS) ×2 IMPLANT
TAPE PAPER 3X10 WHT MICROPORE (GAUZE/BANDAGES/DRESSINGS) ×1 IMPLANT
TOWEL OR 17X24 6PK STRL BLUE (TOWEL DISPOSABLE) ×12 IMPLANT
TOWEL OR 17X26 10 PK STRL BLUE (TOWEL DISPOSABLE) ×12 IMPLANT
TRAY FOLEY IC TEMP SENS 16FR (CATHETERS) ×3 IMPLANT
TUBE SUCT INTRACARD DLP 20F (MISCELLANEOUS) ×1 IMPLANT
UNDERPAD 30X30 (UNDERPADS AND DIAPERS) ×6 IMPLANT
VALVE MAGNA EASE AORTIC 27MM (Prosthesis & Implant Heart) ×1 IMPLANT
VENT LEFT HEART 12002 (CATHETERS) ×3
WATER STERILE IRR 1000ML POUR (IV SOLUTION) ×12 IMPLANT

## 2016-01-27 NOTE — Progress Notes (Signed)
  Echocardiogram Echocardiogram Transesophageal has been performed.  Diamond Nickel 01/27/2016, 9:16 AM

## 2016-01-27 NOTE — Interval H&P Note (Signed)
History and Physical Interval Note:  01/27/2016 6:40 AM  Bill [name]  has presented today for surgery, with the diagnosis of AS AFIB  The various methods of treatment have been discussed with the patient and family. After consideration of risks, benefits and other options for treatment, the patient has consented to  Procedure(s): AORTIC VALVE REPLACEMENT (AVR) (N/A) MAZE (N/A) TRANSESOPHAGEAL ECHOCARDIOGRAM (TEE) (N/A) as a surgical intervention .  The patient's history has been reviewed, patient examined, no change in status, stable for surgery.  I have reviewed the patient's chart and labs.  Questions were answered to the patient's satisfaction.     Rexene Alberts

## 2016-01-27 NOTE — Progress Notes (Signed)
Patient ID: Bill [name], male   DOB: [birth year], 75 y.o.   MRN: [id number]   SICU Evening Rounds:   Hemodynamically stable  CI = 2.0  Has started to wake up on vent  Urine output good  CT output low initially but then put out 180 cc of clotting bloody fluid the past hr from the anterior mediastinal tube. Plts were 84K postop.  CBC    Component Value Date/Time   WBC 8.1 01/27/2016 1452   RBC 3.46 (L) 01/27/2016 1452   HGB 11.2 (L) 01/27/2016 1459   HCT 33.0 (L) 01/27/2016 1459   PLT 84 (L) 01/27/2016 1452   MCV 101.7 (H) 01/27/2016 1452   MCH 32.9 01/27/2016 1452   MCHC 32.4 01/27/2016 1452   RDW 13.8 01/27/2016 1452   LYMPHSABS 1.0 09/08/2015 0821   MONOABS 0.6 09/08/2015 0821   EOSABS 0.2 09/08/2015 0821   BASOSABS 0.0 09/08/2015 0821     BMET    Component Value Date/Time   NA 136 01/27/2016 1459   K 5.4 (H) 01/27/2016 1459   CL 98 (L) 01/27/2016 1318   CO2 21 (L) 01/25/2016 1254   GLUCOSE 126 (H) 01/27/2016 1459   GLUCOSE 109 (H) 04/17/2006 1259   BUN 20 01/27/2016 1318   CREATININE 1.00 01/27/2016 1318   CREATININE 2.28 (H) 02/23/2015 1113   CALCIUM 10.0 01/25/2016 1254   GFRNONAA 43 (L) 01/25/2016 1254   GFRAA 50 (L) 01/25/2016 1254     A/P:  Stable postop course. Increased chest tube output last hr with thrombocytopenia. Will give platelets.

## 2016-01-27 NOTE — Brief Op Note (Addendum)
01/27/2016  12:44 PM  PATIENT:  Bill [name]  75 y.o. male  PRE-OPERATIVE DIAGNOSIS:  Aortic Stenosis, Atrial Fibrillation    POST-OPERATIVE DIAGNOSIS:  Aortic Stenosis, Atrial Fibrillation   PROCEDURE:  Procedure(s):  AORTIC VALVE REPLACEMENT (AVR) (N/A) -27 mm Edwards Magna Ease Pericardial Tissue Valve  MAZE (N/A) -Complete Bi-Atrial Lesion set using Cryothermy/RF Ablation -Clipping LA Appendage  PATENT FORAMEN OVALE CLOSURE (N/A)  TRANSESOPHAGEAL ECHOCARDIOGRAM (TEE) (N/A)  SURGEON:    Rexene Alberts, MD  ASSISTANTS:  Ellwood Handler, PA-C  ANESTHESIA:   Nolon Nations, MD  CROSSCLAMP TIME:   120'  CARDIOPULMONARY BYPASS TIME: 160'  FINDINGS:  Severe calcific aortic stenosis  Normal LV systolic function  Mild mitral regurgitation  Patent foramen ovale  COMPLICATIONS: None  BASELINE WEIGHT: 118 kg  PATIENT DISPOSITION:   TO SICU IN STABLE CONDITION  Rexene Alberts, MD 01/27/2016 2:10 PM

## 2016-01-27 NOTE — Anesthesia Procedure Notes (Signed)
Procedures

## 2016-01-27 NOTE — Op Note (Signed)
CARDIOTHORACIC SURGERY OPERATIVE NOTE  Date of Procedure:  01/27/2016  Preoperative Diagnosis:   Severe Aortic Stenosis   Recurrent Persistent Atrial Fibrillation  Postoperative Diagnosis: Same   Procedure:    Aortic Valve Replacement  Edwards Magna Ease Pericardial Tissue Valve (size 27 mm, model # 3300TFX, serial # [id number])   Maze Procedure  Complete bilateral atrial lesion set using cryothermy and bipolar radiofrequency ablation  Clipping of Left Atrial Appendage (Atriclip, size 49mm)   Closure of Patent Foramen Ovale   Surgeon: Valentina Gu. Roxy Manns, MD  Assistant: Ellwood Handler, PA-C  Anesthesia: Nolon Nations, MD  Operative Findings:  Severe calcific aortic stenosis  Normal LV systolic function  Mild mitral regurgitation  Patent foramen ovale              BRIEF CLINICAL NOTE AND INDICATIONS FOR SURGERY  Patient is a 75 year old obese white male with history of aortic stenosis, chronic persistent atrial fibrillation, chronic diastolic congestive heart failure, hypertension, type 2 diabetes mellitus with complications, and stage IIIchronic kidney disease Who has been referred for surgical consultation to discuss treatment options for management of severe symptomatic aortic stenosis. The patient has a long-standing history of chronic diastolic congestive heart failure and atrial fibrillation. He has been followed for several years by Dr. Lovena Le. Transthoracic echocardiograms have suggested the presence of moderate aortic stenosis with preserved left ventricular systolic function. The patient also has been treated for atrial fibrillation which has failed medical therapy with Tikosyn. He has been chronically anticoagulated using Eliquis but he recently suffered a retinal hemorrhage that required surgical intervention. The patient states that he has been instructed to resume taking Eliquis at the previous dose but he has decreased the dose on his own because of  concerns regarding risk of bleeding. He was referred to Dr. [name] to consider placement of a Watchman left atrial occluder. Concerns were raised regarding whether or not the patient's aortic stenosis might be severe. The patient subsequently underwent transesophageal echocardiogram which confirmed the presence of severe aortic stenosis. The patient was referred for surgical consultation to discuss treatment options further.   DETAILS OF THE OPERATIVE PROCEDURE  Preparation:  The patient is brought to the operating room on the above mentioned date and central monitoring was established by the anesthesia team including placement of Swan-Ganz catheter and radial arterial line. The patient is placed in the supine position on the operating table.  Intravenous antibiotics are administered. General endotracheal anesthesia is induced uneventfully. A Foley catheter is placed.  Baseline transesophageal echocardiogram was performed.  Findings were notable for normal LV systolic function with mild LV hypertrophy.  There was severe aortic stenosis.  The aortic valve was trileaflet.  There was trivial aortic insufficiency.  There was mild mitral regurgitation.  The left atrium was dilated.  The patient's chest, abdomen, both groins, and both lower extremities are prepared and draped in a sterile manner. A time out procedure is performed.   Surgical Approach:  A median sternotomy incision was performed and the pericardium is opened. The ascending aorta is normal in appearance.    Extracorporeal Cardiopulmonary Bypass and Myocardial Protection:  The patient is heparinized systemically.  The right common femoral vein is cannulated with the Seldinger technique and a guidewire is advanced under transesophageal echocardiogram guidance through the right atrium. The femoral vein is cannulated with a long 22 French femoral venous cannula.  The ascending aorta and the right atrium are cannulated for cardiopulmonary  bypass.  Adequate heparinization is verified.   A retrograde  cardioplegia cannula is placed through the right atrium into the coronary sinus.  The operative field was continuously flooded with carbon dioxide gas.  The entire pre-bypass portion of the operation was notable for stable hemodynamics.  Cardiopulmonary bypass was begun and the surface of the heart is inspected.  Vacuum assist venous drainage is utilized.  A second venous cannula is placed directly into the superior vena cava.  A cardioplegia cannula is placed in the ascending aorta.  A temperature probe was placed in the interventricular septum.  The patient is cooled to 32C systemic temperature.  The aortic cross clamp is applied and cold blood cardioplegia is delivered initially in an antegrade fashion through the aortic root.  Supplemental cardioplegia is given retrograde through the coronary sinus catheter.  Iced saline slush is applied for topical hypothermia.  The initial cardioplegic arrest is rapid with early diastolic arrest.  Repeat doses of cardioplegia are administered intermittently throughout the entire cross clamp portion of the operation through the aortic root and the coronary sinus catheter in order to maintain completely flat electrocardiogram and septal myocardial temperature below 15C.  Myocardial protection was felt to be excellent.   Maze Procedure (left atrial lesion set):  The AtriCure Synergy bipolar radiofrequency ablation clamp is used for all radiofrequency ablation lesions for the maze procedure.  The Atricure CryoICE nitrous oxide cryothermy system is utilized for all cryothermy ablation lesions.   The heart is retracted towards the surgeon's side and the left sided pulmonary veins exposed.  An elliptical ablation lesion is created around the base of the left sided pulmonary veins.  A similar elliptical lesion was created around the base of the left atrial appendage.  The left atrial appendage was  obliterated using an Atricure left atrial appendage clip (Atriclip, size 9mm).  The heart was replaced into the pericardial sac.  A left atriotomy incision was performed through the interatrial groove and extended partially across the back wall of the left atrium after opening the oblique sinus inferiorly.  An obvious patent foramen ovale was identified in the fossa ovalis.  The floor of the left atrium and the mitral valve were exposed using a self-retaining retractor.    An ablation lesion was placed around the right sided pulmonary veins using the bipolar clamp with one limb of the clamp along the endocardial surface and one along the epicardial surface posteriorly.  A bipolar ablation lesion was placed across the dome of the left atrium from the cephalad apex of the atriotomy incision to reach the cephalad apex of the elliptical lesion around the left sided pulmonary veins.  A similar bipolar lesion was placed across the back wall of the left atrium from the caudad apex of the atriotomy incision to reach the caudad apex of the elliptical lesion around the left sided pulmonary veins, thereby completing a box.  Finally another bipolar lesion was placed across the back wall of the left atrium from the caudad apex of the atriotomy incision towards the posterior mitral valve annulus.  This lesion was completed along the endocardial surface onto the posterior mitral annulus with a 3 minute duration cryothermy lesion, followed by a second cryothermy lesion along the posterior epicardial surface of the left atrium to the coronary sinus.  This completes the entire left side lesion set of the Cox maze procedure.   Closure of Patent Foramen Ovale:  The patent foramen ovale was oversewn using a 2 layer closure of running 4-0 Prolene suture.  The left atriotomy was closed using  a 2-layer closure of running 3-0 Prolene suture after placing a sump drain across the mitral valve to serve as a left ventricular vent.      Aortic Valve Replacement:  An oblique transverse aortotomy incision was performed.  The aortic valve was inspected and notable for severe aortic stenosis.  The aortic valve leaflets were excised sharply and the aortic annulus decalcified.  Decalcification was notably straightforward.  The aortic annulus was sized to accept a 27 mm prosthesis.  The aortic root and left ventricle were irrigated with copious cold saline solution.  Aortic valve replacement was performed using interrupted horizontal mattress 2-0 Ethibond pledgeted sutures with pledgets in the subannular position.  An [facility] Ease pericardial tissue valve (size 27 mm, model # 3300TFX, serial # [id number]) was implanted uneventfully. The valve seated appropriately with adequate space beneath the left main and right coronary artery.  The aortotomy was closed using a 2-layer closure of running 4-0 Prolene suture.  One final dose of warm retrograde "hot shot" cardioplegia was administered retrograde through the coronary sinus catheter while all air was evacuated through the aortic root.  The aortic cross clamp was removed after a total cross clamp time of 120 minutes.   Maze Procedure (right atrial lesion set):  The retrograde cardioplegia cannula was removed and the small hole in the right atrium extended a short distance.  The AtriCure Synergy bipolar radiofrequency ablation clamp is utilized to create a series of linear lesions in the right atrium, each with one limb of the clamp along the endocardial surface and the other along the epicardial surface. The first lesion is placed from the posterior apex of the atriotomy incision and along the lateral wall of the right atrium to reach the lateral aspect of the superior vena cava. A second lesion is placed in the opposite direction from the posterior apex of the atriotomy incision along the lateral wall to reach the lateral aspect of the inferior vena cava. A third lesion is placed from  the midportion of the atriotomy incision extending at a right angle to reach the tip of the right atrial appendage. A fourth lesion is placed from the anterior apex of the atriotomy incision in an anterior and inferior direction to reach the acute margin of the heart. Finally, the cryotherapy probe is utilized to complete the right atrial lesion set by placing the probe along the endocardial surface of the right atrium from the anterior apex of the atriotomy incision to reach the tricuspid annulus at the 2:00 position. The right atriotomy incision is closed with a 2 layer closure of running 4-0 Prolene suture.   Procedure Completion:  Epicardial pacing wires are fixed to the right ventricular outflow tract and to the right atrial appendage. The patient is rewarmed to 37C temperature. The left ventricular vent is removed.  The patient is weaned and disconnected from cardiopulmonary bypass.  The patient's rhythm at separation from bypass was junctional.  The patient was weaned from cardioplegic bypass without any inotropic support. Total cardiopulmonary bypass time for the operation was 160 minutes.  Followup transesophageal echocardiogram performed after separation from bypass revealed a well-seated aortic valve prosthesis that was functioning normally and without any sign of perivalvular leak.  Left ventricular function was unchanged from preoperatively.  The aortic and superior vena cava cannulae were removed uneventfully. Protamine was administered to reverse the anticoagulation. The femoral venous cannula was removed and manual pressure held on the groin for 30 minutes.  The mediastinum and pleural space  were inspected for hemostasis and irrigated with saline solution. The mediastinum and and both pleural spaces were drained using 4 chest tubes placed through separate stab incisions inferiorly.  The soft tissues anterior to the aorta were reapproximated loosely. The sternum is closed with double  strength sternal wire. The soft tissues anterior to the sternum were closed in multiple layers and the skin is closed with a running subcuticular skin closure.  The post-bypass portion of the operation was notable for stable rhythm and hemodynamics.  No blood products were administered during the operation.   Disposition:  The patient tolerated the procedure well and is transported to the surgical intensive care in stable condition. There are no intraoperative complications. All sponge instrument and needle counts are verified correct at completion of the operation.    Valentina Gu. Roxy Manns MD 01/27/2016 2:11 PM

## 2016-01-27 NOTE — Progress Notes (Signed)
RT note- increased chest tube drainage, placed back to full support.

## 2016-01-27 NOTE — Progress Notes (Signed)
RT note- increased respiratory rate on ventilator post ABG

## 2016-01-27 NOTE — Transfer of Care (Signed)
Immediate Anesthesia Transfer of Care Note  Patient: Bill [name]  Procedure(s) Performed: Procedure(s): AORTIC VALVE REPLACEMENT (AVR) (N/A) MAZE (N/A) TRANSESOPHAGEAL ECHOCARDIOGRAM (TEE) (N/A) PATENT FORAMEN OVALE CLOSURE (N/A)  Patient Location: SICU  Anesthesia Type:General  Level of Consciousness: Patient remains intubated per anesthesia plan  Airway & Oxygen Therapy: Patient remains intubated per anesthesia plan  Post-op Assessment: Report given to RN and Post -op Vital signs reviewed and stable  Post vital signs: Reviewed and stable  Last Vitals:  Vitals:   01/27/16 0603  BP: (!) 150/70  Pulse: 86  Resp: 18  Temp: 36.6 C    Last Pain:  Vitals:   01/27/16 0603  TempSrc: Oral         Complications: No apparent anesthesia complications

## 2016-01-27 NOTE — Anesthesia Procedure Notes (Signed)
Procedure Name: Intubation Date/Time: 01/27/2016 8:06 AM Performed by: Clearnce Sorrel Pre-anesthesia Checklist: Patient identified, Emergency Drugs available, Suction available, Patient being monitored and Timeout performed Patient Re-evaluated:Patient Re-evaluated prior to inductionOxygen Delivery Method: Circle system utilized Intubation Type: IV induction Ventilation: Mask ventilation without difficulty and Oral airway inserted - appropriate to patient size Laryngoscope Size: Mac and 4 Grade View: Grade I Tube type: Oral Laser Tube: Cuffed inflated with minimal occlusive pressure - saline Tube size: 8.0 mm Number of attempts: 1 Airway Equipment and Method: Stylet Placement Confirmation: ETT inserted through vocal cords under direct vision,  positive ETCO2 and breath sounds checked- equal and bilateral Secured at: 23 cm Tube secured with: Tape Dental Injury: Teeth and Oropharynx as per pre-operative assessment

## 2016-01-28 ENCOUNTER — Inpatient Hospital Stay (HOSPITAL_COMMUNITY): Payer: PPO

## 2016-01-28 ENCOUNTER — Encounter (HOSPITAL_COMMUNITY): Payer: Self-pay | Admitting: Thoracic Surgery (Cardiothoracic Vascular Surgery)

## 2016-01-28 DIAGNOSIS — Z4682 Encounter for fitting and adjustment of non-vascular catheter: Secondary | ICD-10-CM | POA: Diagnosis not present

## 2016-01-28 DIAGNOSIS — J9811 Atelectasis: Secondary | ICD-10-CM | POA: Diagnosis not present

## 2016-01-28 LAB — POCT I-STAT 3, ART BLOOD GAS (G3+)
ACID-BASE DEFICIT: 7 mmol/L — AB (ref 0.0–2.0)
ACID-BASE DEFICIT: 6 mmol/L — AB (ref 0.0–2.0)
Bicarbonate: 19.3 mmol/L — ABNORMAL LOW (ref 20.0–28.0)
Bicarbonate: 19.5 mmol/L — ABNORMAL LOW (ref 20.0–28.0)
O2 Saturation: 98 %
O2 Saturation: 96 %
PH ART: 7.294 — AB (ref 7.350–7.450)
PH ART: 7.302 — AB (ref 7.350–7.450)
TCO2: 20 mmol/L (ref 0–100)
TCO2: 21 mmol/L (ref 0–100)
pCO2 arterial: 40.1 mmHg (ref 32.0–48.0)
pCO2 arterial: 39.9 mmHg (ref 32.0–48.0)
pO2, Arterial: 113 mmHg — ABNORMAL HIGH (ref 83.0–108.0)
pO2, Arterial: 90 mmHg (ref 83.0–108.0)

## 2016-01-28 LAB — CREATININE, SERUM
CREATININE: 2.08 mg/dL — AB (ref 0.61–1.24)
GFR calc non Af Amer: 29 mL/min — ABNORMAL LOW (ref >60)
GFR, EST AFRICAN AMERICAN: 34 mL/min — AB (ref >60)

## 2016-01-28 LAB — GLUCOSE, CAPILLARY
GLUCOSE-CAPILLARY: 131 mg/dL — AB (ref 65–99)
GLUCOSE-CAPILLARY: 132 mg/dL — AB (ref 65–99)
GLUCOSE-CAPILLARY: 121 mg/dL — AB (ref 65–99)
GLUCOSE-CAPILLARY: 121 mg/dL — AB (ref 65–99)
GLUCOSE-CAPILLARY: 115 mg/dL — AB (ref 65–99)
GLUCOSE-CAPILLARY: 195 mg/dL — AB (ref 65–99)
GLUCOSE-CAPILLARY: 170 mg/dL — AB (ref 65–99)
GLUCOSE-CAPILLARY: 163 mg/dL — AB (ref 65–99)
Glucose-Capillary: 104 mg/dL — ABNORMAL HIGH (ref 65–99)
Glucose-Capillary: 115 mg/dL — ABNORMAL HIGH (ref 65–99)
Glucose-Capillary: 121 mg/dL — ABNORMAL HIGH (ref 65–99)
Glucose-Capillary: 122 mg/dL — ABNORMAL HIGH (ref 65–99)
Glucose-Capillary: 129 mg/dL — ABNORMAL HIGH (ref 65–99)
Glucose-Capillary: 140 mg/dL — ABNORMAL HIGH (ref 65–99)
Glucose-Capillary: 210 mg/dL — ABNORMAL HIGH (ref 65–99)
Glucose-Capillary: 91 mg/dL (ref 65–99)

## 2016-01-28 LAB — CBC
HCT: 25.6 % — ABNORMAL LOW (ref 39.0–52.0)
HCT: 23.8 % — ABNORMAL LOW (ref 39.0–52.0)
Hemoglobin: 8 g/dL — ABNORMAL LOW (ref 13.0–17.0)
Hemoglobin: 7.8 g/dL — ABNORMAL LOW (ref 13.0–17.0)
MCH: 31.7 pg (ref 26.0–34.0)
MCH: 32.8 pg (ref 26.0–34.0)
MCHC: 31.3 g/dL (ref 30.0–36.0)
MCHC: 32.8 g/dL (ref 30.0–36.0)
MCV: 101.6 fL — ABNORMAL HIGH (ref 78.0–100.0)
MCV: 100 fL (ref 78.0–100.0)
PLATELETS: 108 10*3/uL — AB (ref 150–400)
Platelets: 120 10*3/uL — ABNORMAL LOW (ref 150–400)
RBC: 2.52 MIL/uL — ABNORMAL LOW (ref 4.22–5.81)
RBC: 2.38 MIL/uL — ABNORMAL LOW (ref 4.22–5.81)
RDW: 13.8 % (ref 11.5–15.5)
RDW: 14.1 % (ref 11.5–15.5)
WBC: 8.2 10*3/uL (ref 4.0–10.5)
WBC: 10.8 10*3/uL — AB (ref 4.0–10.5)

## 2016-01-28 LAB — POCT I-STAT, CHEM 8
BUN: 26 mg/dL — ABNORMAL HIGH (ref 6–20)
CALCIUM ION: 1.15 mmol/L (ref 1.15–1.40)
CREATININE: 2.1 mg/dL — AB (ref 0.61–1.24)
Chloride: 103 mmol/L (ref 101–111)
GLUCOSE: 198 mg/dL — AB (ref 65–99)
HCT: 24 % — ABNORMAL LOW (ref 39.0–52.0)
HEMOGLOBIN: 8.2 g/dL — AB (ref 13.0–17.0)
POTASSIUM: 4.9 mmol/L (ref 3.5–5.1)
Sodium: 135 mmol/L (ref 135–145)
TCO2: 19 mmol/L (ref 0–100)

## 2016-01-28 LAB — PREPARE PLATELET PHERESIS: UNIT DIVISION: 0

## 2016-01-28 LAB — BASIC METABOLIC PANEL
Anion gap: 8 (ref 5–15)
BUN: 19 mg/dL (ref 6–20)
CO2: 20 mmol/L — AB (ref 22–32)
CREATININE: 1.65 mg/dL — AB (ref 0.61–1.24)
Calcium: 7.8 mg/dL — ABNORMAL LOW (ref 8.9–10.3)
Chloride: 107 mmol/L (ref 101–111)
GFR calc non Af Amer: 38 mL/min — ABNORMAL LOW (ref >60)
GFR, EST AFRICAN AMERICAN: 45 mL/min — AB (ref >60)
Glucose, Bld: 134 mg/dL — ABNORMAL HIGH (ref 65–99)
Potassium: 4.9 mmol/L (ref 3.5–5.1)
SODIUM: 135 mmol/L (ref 135–145)

## 2016-01-28 LAB — MAGNESIUM
MAGNESIUM: 2.2 mg/dL (ref 1.7–2.4)
Magnesium: 2.4 mg/dL (ref 1.7–2.4)

## 2016-01-28 LAB — PLATELET COUNT: Platelets: 90 10*3/uL — ABNORMAL LOW (ref 150–400)

## 2016-01-28 MED ORDER — INSULIN DETEMIR 100 UNIT/ML ~~LOC~~ SOLN
20.0000 [IU] | Freq: Once | SUBCUTANEOUS | Status: AC
  Administered 2016-01-28: 20 [IU] via SUBCUTANEOUS
  Filled 2016-01-28: qty 0.2

## 2016-01-28 MED ORDER — INSULIN ASPART 100 UNIT/ML ~~LOC~~ SOLN
0.0000 [IU] | SUBCUTANEOUS | Status: DC
  Administered 2016-01-28: 4 [IU] via SUBCUTANEOUS
  Administered 2016-01-28: 8 [IU] via SUBCUTANEOUS
  Administered 2016-01-28 (×2): 4 [IU] via SUBCUTANEOUS

## 2016-01-28 MED ORDER — MORPHINE SULFATE (PF) 2 MG/ML IV SOLN
1.0000 mg | INTRAVENOUS | Status: DC | PRN
  Administered 2016-01-29 – 2016-01-30 (×2): 2 mg via INTRAVENOUS
  Filled 2016-01-28 (×2): qty 1

## 2016-01-28 MED ORDER — ORAL CARE MOUTH RINSE
15.0000 mL | Freq: Two times a day (BID) | OROMUCOSAL | Status: DC
  Administered 2016-01-28 – 2016-01-31 (×6): 15 mL via OROMUCOSAL

## 2016-01-28 MED ORDER — FUROSEMIDE 10 MG/ML IJ SOLN
20.0000 mg | Freq: Once | INTRAMUSCULAR | Status: AC
  Administered 2016-01-28: 20 mg via INTRAVENOUS
  Filled 2016-01-28: qty 2

## 2016-01-28 MED ORDER — WARFARIN SODIUM 2.5 MG PO TABS
2.5000 mg | ORAL_TABLET | Freq: Every day | ORAL | Status: DC
  Administered 2016-01-28 – 2016-02-01 (×5): 2.5 mg via ORAL
  Filled 2016-01-28 (×5): qty 1

## 2016-01-28 MED ORDER — WARFARIN - PHYSICIAN DOSING INPATIENT
Freq: Every day | Status: DC
  Administered 2016-01-28: 18:00:00
  Administered 2016-01-29: 1
  Administered 2016-01-31 – 2016-02-01 (×2)

## 2016-01-28 NOTE — Care Management Note (Addendum)
Case Management Note  Patient Details  Name: Bill [name] MRN: [id number] Date of Birth: 1940-06-21  Subjective/Objective:   S/p AVR and MAZE                 Action/Plan:  PTA from home independent with wife, already has walker and 3:1 in the home but doesn't use them currently.  Wife will be with pt at discharge.  CM will continue to monitor for disposition needs   Expected Discharge Date:                  Expected Discharge Plan:  Home/Self Care  In-House Referral:     Discharge planning Services  CM Consult  Post Acute Care Choice:    Choice offered to:     DME Arranged:    DME Agency:     HH Arranged:    HH Agency:     Status of Service:  In process, will continue to follow  If discussed at Long Length of Stay Meetings, dates discussed:    Additional Comments:  Maryclare Labrador, RN 01/28/2016, 11:10 AM

## 2016-01-28 NOTE — Procedures (Signed)
Extubation Procedure Note  Patient Details:   Name: Bill [name] DOB: [birth year] MRN: [id number]   Airway Documentation:     Evaluation  O2 sats: stable throughout Complications: No apparent complications Patient did tolerate procedure well. Bilateral Breath Sounds: Clear, Diminished   Yes Pt extubated to 4 L/m humidified [place] subsequent to successfully completing rapid wean protocol and respiratory mechanics with following measurements: VC .92 L, NIF -25. No stridor noted and Pt was able to speak post extubation.   Delana Manganello A Wadell Craddock 01/28/2016, 1:35 AM

## 2016-01-28 NOTE — Progress Notes (Signed)
[address]       [place] [address]             [phone]        CARDIOTHORACIC SURGERY PROGRESS NOTE   R1 Day Post-Op Procedure(s) (LRB): AORTIC VALVE REPLACEMENT (AVR) (N/A) MAZE (N/A) TRANSESOPHAGEAL ECHOCARDIOGRAM (TEE) (N/A) PATENT FORAMEN OVALE CLOSURE (N/A)  Subjective: Looks good.  Minimal pain, only with movement and deep breaths.  Breathing comfortably.  Objective: Vital signs: BP Readings from Last 1 Encounters:  01/28/16 (!) 97/58   Pulse Readings from Last 1 Encounters:  01/28/16 89   Resp Readings from Last 1 Encounters:  01/28/16 18   Temp Readings from Last 1 Encounters:  01/28/16 99.9 F (37.7 C) (Core (Comment))    Hemodynamics: PAP: (21-44)/(8-26) 29/14 CO:  [3 L/min-5.1 L/min] 4.9 L/min CI:  [1.2 L/min/m2-2.1 L/min/m2] 2.1 L/min/m2  Physical Exam:  Rhythm:   Junctional bradycardia - will AAI pace w/ long 1st degree AV block  Breath sounds: clear  Heart sounds:  RRR w/out murmur  Incisions:  Dressings dry, intact  Abdomen:  Soft, non-distended, non-tender  Extremities:  Warm, well-perfused  Chest tubes:  Low volume thin serosanguinous output, no air leak     Intake/Output from previous day: 09/07 0701 - 09/08 0700 In: 6965.7 [I.V.:4909.7; Blood:656; IV Piggyback:1400] Out: [id number]; Blood:800; Chest Tube:1270] Intake/Output this shift: No intake/output data recorded.  Lab Results:  CBC: Recent Labs  01/27/16 2104 01/27/16 2106 01/28/16 0408  WBC 10.2  --  8.2  HGB 9.8* 9.9* 8.0*  HCT 31.0* 29.0* 25.6*  PLT 129*  --  120*    BMET:  Recent Labs  01/25/16 1254  01/27/16 2106 01/28/16 0408  NA 136  < > 136 135  K 4.0  < > 5.4* 4.9  CL 107  < > 101 107  CO2 21*  --   --  20*  GLUCOSE 129*  < > 121* 134*  BUN 17  < > 21* 19  CREATININE 1.50*  < > 1.30* 1.65*  CALCIUM 10.0  --   --  7.8*  < > = values in this interval not displayed.   PT/INR:   Recent Labs  01/27/16 1546  LABPROT  17.5*  INR 1.42    CBG (last 3)   Recent Labs  01/28/16 0509 01/28/16 0606 01/28/16 0704  GLUCAP 122* 129* 121*    ABG    Component Value Date/Time   PHART 7.302 (L) 01/28/2016 0228   PCO2ART 39.9 01/28/2016 0228   PO2ART 90.0 01/28/2016 0228   HCO3 19.5 (L) 01/28/2016 0228   TCO2 21 01/28/2016 0228   ACIDBASEDEF 6.0 (H) 01/28/2016 0228   O2SAT 96.0 01/28/2016 0228    CXR: PORTABLE CHEST 1 VIEW  COMPARISON:  01/27/2016 .  FINDINGS: Interim extubation. Swan-Ganz catheter stable position. Mediastinal drainage catheter stable position. Bilateral chest tubes in stable position. No pneumothorax. Bilateral chest tubes in stable position. Prior cardiac valve replacement. Stable cardiomegaly. Bibasilar atelectasis. Developing left lower lobe infiltrate cannot be excluded. Small left pleural effusion .  IMPRESSION: 1. Interim extubation. Remaining lines and tubes including bilateral chest tubes in stable position. No pneumothorax.  2. Persistent bibasilar atelectasis. Developing left lower lobe infiltrate. Small left pleural effusion cannot be excluded.  3.  Prior cardiac valve replacement.  Stable cardiomegaly.   Electronically Signed   By: Marcello Moores  Register   On: 01/28/2016 07:58   Assessment/Plan: S/P Procedure(s) (LRB): AORTIC VALVE REPLACEMENT (AVR) (  N/A) MAZE (N/A) TRANSESOPHAGEAL ECHOCARDIOGRAM (TEE) (N/A) PATENT FORAMEN OVALE CLOSURE (N/A)  Overall doing very well POD1 Remains pacer-dependent with very slow junctional rhythm under pacer Stable hemodynamics on low dose Neo drip for BP support Expected post op acute blood loss anemia, Hgb 8.0 this morning Chronic diastolic CHF with expected post-op volume excess Post op thrombocytopenia, platelet count 120k this morning Expected post op atelectasis, mild Type II diabetes mellitus, excellent glycemic control Neuromuscular disorder w/ resting tremor at baseline   Mobilize  Wean Neo  off  Leave chest tubes in place until output decreased  Continue pacing and hold beta blockers and Tikosyn for now  Start coumadin slowly    Rexene Alberts, MD 01/28/2016 8:15 AM

## 2016-01-28 NOTE — Progress Notes (Addendum)
[facility] BRIEF SICU PROGRESS NOTE  1 Day Post-Op  S/P Procedure(s) (LRB): AORTIC VALVE REPLACEMENT (AVR) (N/A) MAZE (N/A) TRANSESOPHAGEAL ECHOCARDIOGRAM (TEE) (N/A) PATENT FORAMEN OVALE CLOSURE (N/A)   Doing well Stable paced rhythm and BP off Neo Breathing comfortably w/ O2 sats 100% on 2 L/min UOP adequate Hgb down slightly 7.8  Plan: Continue current plan.  Watch Hgb for now.  Rexene Alberts, MD 01/28/2016 5:52 PM

## 2016-01-29 ENCOUNTER — Other Ambulatory Visit: Payer: Self-pay | Admitting: Internal Medicine

## 2016-01-29 ENCOUNTER — Inpatient Hospital Stay (HOSPITAL_COMMUNITY): Payer: PPO

## 2016-01-29 DIAGNOSIS — J90 Pleural effusion, not elsewhere classified: Secondary | ICD-10-CM | POA: Diagnosis not present

## 2016-01-29 DIAGNOSIS — I482 Chronic atrial fibrillation, unspecified: Secondary | ICD-10-CM

## 2016-01-29 LAB — GLUCOSE, CAPILLARY
GLUCOSE-CAPILLARY: 140 mg/dL — AB (ref 65–99)
Glucose-Capillary: 113 mg/dL — ABNORMAL HIGH (ref 65–99)
Glucose-Capillary: 125 mg/dL — ABNORMAL HIGH (ref 65–99)
Glucose-Capillary: 200 mg/dL — ABNORMAL HIGH (ref 65–99)
Glucose-Capillary: 172 mg/dL — ABNORMAL HIGH (ref 65–99)

## 2016-01-29 LAB — CBC
HCT: 22.4 % — ABNORMAL LOW (ref 39.0–52.0)
Hemoglobin: 7.4 g/dL — ABNORMAL LOW (ref 13.0–17.0)
MCH: 32.9 pg (ref 26.0–34.0)
MCHC: 33 g/dL (ref 30.0–36.0)
MCV: 99.6 fL (ref 78.0–100.0)
PLATELETS: 86 10*3/uL — AB (ref 150–400)
RBC: 2.25 MIL/uL — AB (ref 4.22–5.81)
RDW: 14.2 % (ref 11.5–15.5)
WBC: 8.7 10*3/uL (ref 4.0–10.5)

## 2016-01-29 LAB — BASIC METABOLIC PANEL
Anion gap: 6 (ref 5–15)
BUN: 28 mg/dL — AB (ref 6–20)
CALCIUM: 8.1 mg/dL — AB (ref 8.9–10.3)
CO2: 21 mmol/L — AB (ref 22–32)
CREATININE: 2.24 mg/dL — AB (ref 0.61–1.24)
Chloride: 108 mmol/L (ref 101–111)
GFR calc non Af Amer: 27 mL/min — ABNORMAL LOW (ref >60)
GFR, EST AFRICAN AMERICAN: 31 mL/min — AB (ref >60)
Glucose, Bld: 134 mg/dL — ABNORMAL HIGH (ref 65–99)
Potassium: 4.7 mmol/L (ref 3.5–5.1)
SODIUM: 135 mmol/L (ref 135–145)

## 2016-01-29 LAB — PREPARE RBC (CROSSMATCH)

## 2016-01-29 LAB — PROTIME-INR
INR: 1.26
PROTHROMBIN TIME: 15.9 s — AB (ref 11.4–15.2)

## 2016-01-29 MED ORDER — SODIUM CHLORIDE 0.9% FLUSH: 3.0000 mL | INTRAVENOUS | Status: DC | PRN

## 2016-01-29 MED ORDER — VITAMIN B-12 1000 MCG PO TABS
1000.0000 ug | ORAL_TABLET | Freq: Every day | ORAL | Status: DC
  Administered 2016-01-29 – 2016-02-02 (×5): 1000 ug via ORAL
  Filled 2016-01-29 (×5): qty 1

## 2016-01-29 MED ORDER — DOFETILIDE 250 MCG PO CAPS
250.0000 ug | ORAL_CAPSULE | Freq: Two times a day (BID) | ORAL | Status: DC
  Administered 2016-01-29 – 2016-02-02 (×9): 250 ug via ORAL
  Filled 2016-01-29 (×9): qty 1

## 2016-01-29 MED ORDER — INSULIN ASPART 100 UNIT/ML ~~LOC~~ SOLN
0.0000 [IU] | Freq: Three times a day (TID) | SUBCUTANEOUS | Status: DC
  Administered 2016-01-29: 2 [IU] via SUBCUTANEOUS
  Administered 2016-01-29: 4 [IU] via SUBCUTANEOUS
  Administered 2016-01-29: 8 [IU] via SUBCUTANEOUS
  Administered 2016-01-30: 2 [IU] via SUBCUTANEOUS
  Administered 2016-01-30: 4 [IU] via SUBCUTANEOUS
  Administered 2016-01-30: 2 [IU] via SUBCUTANEOUS
  Administered 2016-01-30 – 2016-01-31 (×2): 4 [IU] via SUBCUTANEOUS
  Administered 2016-01-31: 8 [IU] via SUBCUTANEOUS
  Administered 2016-01-31 – 2016-02-01 (×3): 2 [IU] via SUBCUTANEOUS
  Administered 2016-02-01: 4 [IU] via SUBCUTANEOUS
  Administered 2016-02-01 – 2016-02-02 (×4): 2 [IU] via SUBCUTANEOUS

## 2016-01-29 MED ORDER — FUROSEMIDE 10 MG/ML IJ SOLN
40.0000 mg | Freq: Once | INTRAMUSCULAR | Status: AC
  Administered 2016-01-29: 40 mg via INTRAVENOUS
  Filled 2016-01-29: qty 4

## 2016-01-29 MED ORDER — FUROSEMIDE 10 MG/ML IJ SOLN
40.0000 mg | Freq: Once | INTRAMUSCULAR | Status: AC
  Administered 2016-01-29: 40 mg via INTRAVENOUS

## 2016-01-29 MED ORDER — SODIUM CHLORIDE 0.9% FLUSH
3.0000 mL | Freq: Two times a day (BID) | INTRAVENOUS | Status: DC
  Administered 2016-01-29 – 2016-02-01 (×7): 3 mL via INTRAVENOUS

## 2016-01-29 MED ORDER — MOVING RIGHT ALONG BOOK
Freq: Once | Status: AC
  Administered 2016-01-29: 10:00:00
  Filled 2016-01-29: qty 1

## 2016-01-29 MED ORDER — FUROSEMIDE 10 MG/ML IJ SOLN
INTRAMUSCULAR | Status: AC
  Filled 2016-01-29: qty 4

## 2016-01-29 MED ORDER — SODIUM CHLORIDE 0.9 % IV SOLN
Freq: Once | INTRAVENOUS | Status: AC
  Administered 2016-01-29: 10:00:00 via INTRAVENOUS

## 2016-01-29 MED ORDER — SODIUM CHLORIDE 0.9 % IV SOLN: 250.0000 mL | INTRAVENOUS | Status: DC | PRN

## 2016-01-29 NOTE — Progress Notes (Signed)
[address]       [place] [address]             [phone]        CARDIOTHORACIC SURGERY PROGRESS NOTE   R2 Days Post-Op Procedure(s) (LRB): AORTIC VALVE REPLACEMENT (AVR) (N/A) MAZE (N/A) TRANSESOPHAGEAL ECHOCARDIOGRAM (TEE) (N/A) PATENT FORAMEN OVALE CLOSURE (N/A)  Subjective: Looks good and feels well but couldn't sleep much last night due to noise in ICU  Objective: Vital signs: BP Readings from Last 1 Encounters:  01/29/16 127/61   Pulse Readings from Last 1 Encounters:  01/29/16 80   Resp Readings from Last 1 Encounters:  01/29/16 18   Temp Readings from Last 1 Encounters:  01/29/16 97.5 F (36.4 C) (Oral)    Hemodynamics: PAP: (34)/(14) 34/14  Physical Exam:  Rhythm:   Sinus w/ long 1st degree AV block, some 2nd degree  Breath sounds: clear  Heart sounds:  RRR  Incisions:  Dressing dry, intact  Abdomen:  Soft, non-distended, non-tender  Extremities:  Warm, well-perfused  Chest tubes:  Decreasing but significant volume thin serosanguinous output via pleural tubes, no air leak    Intake/Output from previous day: 09/08 0701 - 09/09 0700 In: 705.7 [I.V.:605.7; IV Piggyback:100] Out: [id number] [Urine:795; Chest Tube:810] Intake/Output this shift: No intake/output data recorded.  Lab Results:  CBC: Recent Labs  01/28/16 1700 01/29/16 0450  WBC 10.8* 8.7  HGB 7.8* 7.4*  HCT 23.8* 22.4*  PLT 108* 86*    BMET:  Recent Labs  01/28/16 0408 01/28/16 1656 01/28/16 1700 01/29/16 0450  NA 135 135  --  135  K 4.9 4.9  --  4.7  CL 107 103  --  108  CO2 20*  --   --  21*  GLUCOSE 134* 198*  --  134*  BUN 19 26*  --  28*  CREATININE 1.65* 2.10* 2.08* 2.24*  CALCIUM 7.8*  --   --  8.1*     PT/INR:   Recent Labs  01/29/16 0450  LABPROT 15.9*  INR 1.26    CBG (last 3)   Recent Labs  01/28/16 2317 01/29/16 0441 01/29/16 0728  GLUCAP 163* 113* 125*    ABG    Component Value Date/Time   PHART 7.302 (L)  01/28/2016 0228   PCO2ART 39.9 01/28/2016 0228   PO2ART 90.0 01/28/2016 0228   HCO3 19.5 (L) 01/28/2016 0228   TCO2 19 01/28/2016 1656   ACIDBASEDEF 6.0 (H) 01/28/2016 0228   O2SAT 96.0 01/28/2016 0228    CXR: Mild bibasilar atelectasis L>R - overall looks good  Assessment/Plan: S/P Procedure(s) (LRB): AORTIC VALVE REPLACEMENT (AVR) (N/A) MAZE (N/A) TRANSESOPHAGEAL ECHOCARDIOGRAM (TEE) (N/A) PATENT FORAMEN OVALE CLOSURE (N/A)  Overall stable POD2 Rhythm improving under pacer, now sinus w/ long 1st degree block and occasional 2nd degree BP stable off drips Expected post op acute blood loss anemia, Hgb down further to 7.4 this morning Chronic diastolic CHF with expected post-op volume excess, weight 12 lbs above preop baseline Acute on chronic renal insufficiency, likely acute kidney injury due to pre-renal azotemia and CPB - creatinine essentially stable overnight at 2.2 - preop baseline 1.3 Post op thrombocytopenia, platelet count 120k this morning Expected post op atelectasis, mild Type II diabetes mellitus, excellent glycemic control Neuromuscular disorder w/ resting tremor at baseline   Continue to hold beta blockers and Tikosyn for now and turn pacer to VVI backup  Transfuse 2 units PRBC's  Lasix to stimulate diuresis after transfusion  D/C mediastinal tubes but leave pleural tubes in place for now  Mobilize  PT consult   Rexene Alberts, MD 01/29/2016 9:13 AM

## 2016-01-29 NOTE — Progress Notes (Signed)
Patient is presenting with different heart rhythms. Dr. Roxy Manns called and informed. Stated he would order medications and nursing could restart pacer at previous rate. He had turned the pacer down during his assessment. Will continue to monitor.

## 2016-01-29 NOTE — Anesthesia Postprocedure Evaluation (Signed)
Anesthesia Post Note  Patient: [name]  Procedure(s) Performed: Procedure(s) (LRB): AORTIC VALVE REPLACEMENT (AVR) (N/A) MAZE (N/A) TRANSESOPHAGEAL ECHOCARDIOGRAM (TEE) (N/A) PATENT FORAMEN OVALE CLOSURE (N/A)  Patient location during evaluation: SICU Anesthesia Type: General Level of consciousness: sedated and patient remains intubated per anesthesia plan Pain management: pain level controlled Vital Signs Assessment: post-procedure vital signs reviewed and stable Respiratory status: patient remains intubated per anesthesia plan Cardiovascular status: stable Anesthetic complications: no     Last Vitals:  Vitals:   01/29/16 0700 01/29/16 0733  BP: 127/61   Pulse: 80   Resp: 18   Temp:  36.4 C    Last Pain:  Vitals:   01/29/16 0733  TempSrc: Oral  PainSc:    Pain Goal: Patients Stated Pain Goal: 2 (01/28/16 0800)               Bill [name]

## 2016-01-30 LAB — GLUCOSE, CAPILLARY
GLUCOSE-CAPILLARY: 135 mg/dL — AB (ref 65–99)
GLUCOSE-CAPILLARY: 167 mg/dL — AB (ref 65–99)
Glucose-Capillary: 191 mg/dL — ABNORMAL HIGH (ref 65–99)
Glucose-Capillary: 195 mg/dL — ABNORMAL HIGH (ref 65–99)

## 2016-01-30 LAB — TYPE AND SCREEN
ABO/RH(D): A POS
Antibody Screen: NEGATIVE
UNIT DIVISION: 0
Unit division: 0

## 2016-01-30 LAB — BASIC METABOLIC PANEL
Anion gap: 8 (ref 5–15)
BUN: 35 mg/dL — AB (ref 6–20)
CHLORIDE: 105 mmol/L (ref 101–111)
CO2: 21 mmol/L — ABNORMAL LOW (ref 22–32)
CREATININE: 2.34 mg/dL — AB (ref 0.61–1.24)
Calcium: 8.4 mg/dL — ABNORMAL LOW (ref 8.9–10.3)
GFR, EST AFRICAN AMERICAN: 29 mL/min — AB (ref >60)
GFR, EST NON AFRICAN AMERICAN: 25 mL/min — AB (ref >60)
Glucose, Bld: 125 mg/dL — ABNORMAL HIGH (ref 65–99)
POTASSIUM: 4.4 mmol/L (ref 3.5–5.1)
SODIUM: 134 mmol/L — AB (ref 135–145)

## 2016-01-30 LAB — CBC
HCT: 28.4 % — ABNORMAL LOW (ref 39.0–52.0)
HEMOGLOBIN: 9.2 g/dL — AB (ref 13.0–17.0)
MCH: 31.9 pg (ref 26.0–34.0)
MCHC: 32.4 g/dL (ref 30.0–36.0)
MCV: 98.6 fL (ref 78.0–100.0)
Platelets: 105 10*3/uL — ABNORMAL LOW (ref 150–400)
RBC: 2.88 MIL/uL — AB (ref 4.22–5.81)
RDW: 15.2 % (ref 11.5–15.5)
WBC: 10.2 10*3/uL (ref 4.0–10.5)

## 2016-01-30 LAB — PROTIME-INR
INR: 1.37
PROTHROMBIN TIME: 17 s — AB (ref 11.4–15.2)

## 2016-01-30 MED ORDER — FUROSEMIDE 40 MG PO TABS
40.0000 mg | ORAL_TABLET | Freq: Two times a day (BID) | ORAL | Status: DC
  Administered 2016-01-31 – 2016-02-01 (×4): 40 mg via ORAL
  Filled 2016-01-30 (×4): qty 1

## 2016-01-30 MED ORDER — FUROSEMIDE 10 MG/ML IJ SOLN
40.0000 mg | Freq: Two times a day (BID) | INTRAMUSCULAR | Status: DC
  Administered 2016-01-30: 40 mg via INTRAVENOUS
  Filled 2016-01-30: qty 4

## 2016-01-30 NOTE — Progress Notes (Signed)
Dr. Roxy Manns in to see patient. Turned off Pacer and Patient is currently SR 1st degree AVB per Dr. Roxy Manns. Patient is alert and orientedX4, Denies pain, will continue to monitor.

## 2016-01-30 NOTE — Evaluation (Signed)
Physical Therapy Evaluation Patient Details Name: Bill [name] MRN: [id number] DOB: Mar 12, 1941 Today's Date: 01/30/2016   History of Present Illness  Patient is a 75 year old obese white male with history of aortic stenosis, chronic persistent atrial fibrillation, chronic diastolic congestive heart failure, hypertension, type 2 diabetes mellitus with complications, and stage IIIchronic kidney disease, retinal hemorrhage that required surgical intervention,  admitted with sever symptomatic aortic stenosis; Now s/p Aortic Valve replacement, MAZE, and patent foramen ovale closure; of note, pt has a Neuromuscular disorder w/ resting tremor at baseline  Clinical Impression   Patient is s/p above surgery resulting in functional limitations due to the deficits listed below (see PT Problem List).  Patient will benefit from skilled PT to increase their independence and safety with mobility to allow discharge to the venue listed below.       Follow Up Recommendations Home health PT;Supervision/Assistance - 24 hour    Equipment Recommendations  3in1 (PT)    Recommendations for Other Services OT consult     Precautions / Restrictions Precautions Precautions: Sternal      Mobility  Bed Mobility Overal bed mobility: Needs Assistance Bed Mobility: Supine to Sit     Supine to sit: Min assist     General bed mobility comments: Cues for techniwue; min assist to elevate trunk to sit  Transfers Overall transfer level: Needs assistance   Transfers: Sit to/from Stand Sit to Stand: Min assist         General transfer comment: Cues for hand placement and safety  Ambulation/Gait Ambulation/Gait assistance: Min guard Ambulation Distance (Feet): 400 Feet Assistive device:  (pushing wheelchair) Gait Pattern/deviations: Step-through pattern Gait velocity: slow Gait velocity interpretation: Below normal speed for age/gender General Gait Details: Slow moving; two standing rest breaks;  cues to self-monitor for activity tolerance  Stairs            Wheelchair Mobility    Modified Rankin (Stroke Patients Only)       Balance                                             Pertinent Vitals/Pain Pain Assessment: Faces Faces Pain Scale: Hurts a little bit Pain Location: Did not specify; states he "feels weird" Pain Descriptors / Indicators: Discomfort Pain Intervention(s): Repositioned;Monitored during session    Home Living Family/patient expects to be discharged to:: Private residence Living Arrangements: Spouse/significant other Available Help at Discharge: Family;Available 24 hours/day Type of Home: House Home Access: Stairs to enter Entrance Stairs-Rails: Right;Left;Can reach both Entrance Stairs-Number of Steps: 4 Home Layout: One level Home Equipment: Walker - 2 wheels      Prior Function Level of Independence: Independent         Comments: As of Outpt PT visit on 01/05/16: 6 minute walk 1200' (70% of normal for age/gender); Mild balance deficits with 180 degree turns, leading to a TUG score of 13s, indicative of fall risk     Hand Dominance        Extremity/Trunk Assessment   Upper Extremity Assessment: Generalized weakness           Lower Extremity Assessment: Generalized weakness         Communication   Communication: No difficulties  Cognition Arousal/Alertness: Awake/alert Behavior During Therapy: WFL for tasks assessed/performed;Flat affect Overall Cognitive Status: Within Functional Limits for tasks assessed  General Comments General comments (skin integrity, edema, etc.): VSS; walked on Room Air    Exercises        Assessment/Plan    PT Assessment Patient needs continued PT services  PT Diagnosis Difficulty walking;Generalized weakness   PT Problem List Decreased strength;Decreased activity tolerance;Decreased balance;Decreased mobility;Decreased knowledge of use  of DME;Cardiopulmonary status limiting activity  PT Treatment Interventions DME instruction;Gait training;Stair training;Functional mobility training;Therapeutic activities;Therapeutic exercise;Patient/family education   PT Goals (Current goals can be found in the Care Plan section) Acute Rehab PT Goals Patient Stated Goal: back to normal PT Goal Formulation: With patient Time For Goal Achievement: 02/13/16 Potential to Achieve Goals: Good    Frequency Min 3X/week   Barriers to discharge        Co-evaluation               End of Session Equipment Utilized During Treatment: Gait belt Activity Tolerance: Patient tolerated treatment well Patient left: in chair;with call bell/phone within reach Nurse Communication: Mobility status         Time: 1212-1238 PT Time Calculation (min) (ACUTE ONLY): 26 min   Charges:   PT Evaluation $PT Eval Moderate Complexity: 1 Procedure PT Treatments $Gait Training: 8-22 mins   PT G CodesRoney Marion Hamff 01/30/2016, 2:05 PM  Roney Marion, [place] Pager [phone] Office [phone]

## 2016-01-30 NOTE — Progress Notes (Addendum)
      [address]       [place] [address]             [phone]        CARDIOTHORACIC SURGERY PROGRESS NOTE   R3 Days Post-Op Procedure(s) (LRB): AORTIC VALVE REPLACEMENT (AVR) (N/A) MAZE (N/A) TRANSESOPHAGEAL ECHOCARDIOGRAM (TEE) (N/A) PATENT FORAMEN OVALE CLOSURE (N/A)  Subjective: Looks and feels well.  Slept better last night.  Ambulated around SICU this morning - did well but became dyspneic.  Appetite improving  Objective: Vital signs: BP Readings from Last 1 Encounters:  01/30/16 131/67   Pulse Readings from Last 1 Encounters:  01/30/16 70   Resp Readings from Last 1 Encounters:  01/30/16 18   Temp Readings from Last 1 Encounters:  01/30/16 98.2 F (36.8 C) (Oral)    Hemodynamics:    Physical Exam:  Rhythm:   Sinus w/ long 1st degree AV block  Breath sounds: clear  Heart sounds:  RRR  Incisions:  Dressing dry, intact  Abdomen:  Soft, non-distended, non-tender  Extremities:  Warm, well-perfused  Chest tubes:  low volume thin serosanguinous output, no air leak    Intake/Output from previous day: 09/09 0701 - 09/10 0700 In: 1180 [I.V.:240; Blood:940] Out: 2450 [Urine:1855; Chest Tube:595] Intake/Output this shift: Total I/O In: -  Out: 100 [Urine:50; Chest Tube:50]  Lab Results:  CBC: Recent Labs  01/29/16 0450 01/30/16 0637  WBC 8.7 10.2  HGB 7.4* 9.2*  HCT 22.4* 28.4*  PLT 86* 105*    BMET:  Recent Labs  01/29/16 0450 01/30/16 0637  NA 135 134*  K 4.7 4.4  CL 108 105  CO2 21* 21*  GLUCOSE 134* 125*  BUN 28* 35*  CREATININE 2.24* 2.34*  CALCIUM 8.1* 8.4*     PT/INR:   Recent Labs  01/30/16 0637  LABPROT 17.0*  INR 1.37    CBG (last 3)   Recent Labs  01/29/16 1539 01/29/16 1907 01/30/16 0708  GLUCAP 172* 140* 135*    ABG    Component Value Date/Time   PHART 7.302 (L) 01/28/2016 0228   PCO2ART 39.9 01/28/2016 0228   PO2ART 90.0 01/28/2016 0228   HCO3 19.5 (L) 01/28/2016 0228   TCO2 19  01/28/2016 1656   ACIDBASEDEF 6.0 (H) 01/28/2016 0228   O2SAT 96.0 01/28/2016 0228    CXR: n/a  Assessment/Plan: S/P Procedure(s) (LRB): AORTIC VALVE REPLACEMENT (AVR) (N/A) MAZE (N/A) TRANSESOPHAGEAL ECHOCARDIOGRAM (TEE) (N/A) PATENT FORAMEN OVALE CLOSURE (N/A)  Doing well POD3 Maintaining NSR w/ long 1st degree AV block and stable BP Breathing comfortably w/ O2 sats 96-100% on RA Expected post op acute blood loss anemia, Hgb up appropriately to 9.2 this morning following transfusion Chronic diastolic CHF with expected post-op volume excess, I/O negative 1270 mL yesterday but weight still 12 lbs above baseline CKD stage III w/ baseline creatinine 1.5-1.8 - creatinine today essentially stable 2.3 and making good UOP Post op thrombocytopenia, platelet count 105k this morning Expected post op atelectasis, mild Type II diabetes mellitus, excellent glycemic control Neuromuscular disorder w/ resting tremor at baseline Obesity Physical deconditioning   Mobilize  D/C chest tubes  D/C foley  Diuresis  Continue CBG's and SSI  PT consult  Coumadin  Transfer step down   Rexene Alberts, MD 01/30/2016 9:42 AM

## 2016-01-31 ENCOUNTER — Inpatient Hospital Stay (HOSPITAL_COMMUNITY): Payer: PPO

## 2016-01-31 DIAGNOSIS — I5032 Chronic diastolic (congestive) heart failure: Secondary | ICD-10-CM | POA: Diagnosis not present

## 2016-01-31 DIAGNOSIS — J90 Pleural effusion, not elsewhere classified: Secondary | ICD-10-CM | POA: Diagnosis not present

## 2016-01-31 DIAGNOSIS — D62 Acute posthemorrhagic anemia: Secondary | ICD-10-CM | POA: Diagnosis not present

## 2016-01-31 DIAGNOSIS — I481 Persistent atrial fibrillation: Secondary | ICD-10-CM | POA: Diagnosis not present

## 2016-01-31 DIAGNOSIS — N179 Acute kidney failure, unspecified: Secondary | ICD-10-CM | POA: Diagnosis not present

## 2016-01-31 DIAGNOSIS — E1142 Type 2 diabetes mellitus with diabetic polyneuropathy: Secondary | ICD-10-CM | POA: Diagnosis not present

## 2016-01-31 DIAGNOSIS — J9811 Atelectasis: Secondary | ICD-10-CM | POA: Diagnosis not present

## 2016-01-31 DIAGNOSIS — E1122 Type 2 diabetes mellitus with diabetic chronic kidney disease: Secondary | ICD-10-CM | POA: Diagnosis not present

## 2016-01-31 DIAGNOSIS — I130 Hypertensive heart and chronic kidney disease with heart failure and stage 1 through stage 4 chronic kidney disease, or unspecified chronic kidney disease: Secondary | ICD-10-CM | POA: Diagnosis not present

## 2016-01-31 DIAGNOSIS — I080 Rheumatic disorders of both mitral and aortic valves: Secondary | ICD-10-CM | POA: Diagnosis not present

## 2016-01-31 DIAGNOSIS — N183 Chronic kidney disease, stage 3 (moderate): Secondary | ICD-10-CM | POA: Diagnosis not present

## 2016-01-31 DIAGNOSIS — Q211 Atrial septal defect: Secondary | ICD-10-CM | POA: Diagnosis not present

## 2016-01-31 DIAGNOSIS — D6959 Other secondary thrombocytopenia: Secondary | ICD-10-CM | POA: Diagnosis not present

## 2016-01-31 LAB — PROTIME-INR
INR: 1.45
Prothrombin Time: 17.7 seconds — ABNORMAL HIGH (ref 11.4–15.2)

## 2016-01-31 LAB — BASIC METABOLIC PANEL
ANION GAP: 8 (ref 5–15)
BUN: 38 mg/dL — ABNORMAL HIGH (ref 6–20)
CALCIUM: 8.6 mg/dL — AB (ref 8.9–10.3)
CO2: 24 mmol/L (ref 22–32)
CREATININE: 2.11 mg/dL — AB (ref 0.61–1.24)
Chloride: 103 mmol/L (ref 101–111)
GFR, EST AFRICAN AMERICAN: 33 mL/min — AB (ref >60)
GFR, EST NON AFRICAN AMERICAN: 29 mL/min — AB (ref >60)
Glucose, Bld: 132 mg/dL — ABNORMAL HIGH (ref 65–99)
Potassium: 4 mmol/L (ref 3.5–5.1)
Sodium: 135 mmol/L (ref 135–145)

## 2016-01-31 LAB — CBC
HEMATOCRIT: 28.6 % — AB (ref 39.0–52.0)
HEMOGLOBIN: 9.5 g/dL — AB (ref 13.0–17.0)
MCH: 32.6 pg (ref 26.0–34.0)
MCHC: 33.2 g/dL (ref 30.0–36.0)
MCV: 98.3 fL (ref 78.0–100.0)
Platelets: 134 10*3/uL — ABNORMAL LOW (ref 150–400)
RBC: 2.91 MIL/uL — ABNORMAL LOW (ref 4.22–5.81)
RDW: 14.8 % (ref 11.5–15.5)
WBC: 7.9 10*3/uL (ref 4.0–10.5)

## 2016-01-31 LAB — GLUCOSE, CAPILLARY
GLUCOSE-CAPILLARY: 120 mg/dL — AB (ref 65–99)
GLUCOSE-CAPILLARY: 244 mg/dL — AB (ref 65–99)
Glucose-Capillary: 170 mg/dL — ABNORMAL HIGH (ref 65–99)
Glucose-Capillary: 127 mg/dL — ABNORMAL HIGH (ref 65–99)

## 2016-01-31 LAB — MAGNESIUM: MAGNESIUM: 2.1 mg/dL (ref 1.7–2.4)

## 2016-01-31 MED ORDER — METFORMIN HCL 500 MG PO TABS
500.0000 mg | ORAL_TABLET | Freq: Two times a day (BID) | ORAL | Status: DC
  Administered 2016-01-31 – 2016-02-02 (×4): 500 mg via ORAL
  Filled 2016-01-31 (×5): qty 1

## 2016-01-31 MED ORDER — OXYBUTYNIN CHLORIDE 5 MG PO TABS
5.0000 mg | ORAL_TABLET | Freq: Three times a day (TID) | ORAL | Status: DC
  Administered 2016-01-31 – 2016-02-01 (×4): 5 mg via ORAL
  Filled 2016-01-31 (×4): qty 1

## 2016-01-31 MED ORDER — COUMADIN BOOK
Freq: Once | Status: AC
  Administered 2016-01-31: 18:00:00
  Filled 2016-01-31: qty 1

## 2016-01-31 MED ORDER — WARFARIN VIDEO
Freq: Once | Status: AC
  Administered 2016-01-31: 18:00:00

## 2016-01-31 MED FILL — Sodium Bicarbonate IV Soln 8.4%: INTRAVENOUS | Qty: 50 | Status: AC

## 2016-01-31 MED FILL — Mannitol IV Soln 20%: INTRAVENOUS | Qty: 500 | Status: AC

## 2016-01-31 MED FILL — Sodium Chloride IV Soln 0.9%: INTRAVENOUS | Qty: 2000 | Status: AC

## 2016-01-31 MED FILL — Heparin Sodium (Porcine) Inj 1000 Unit/ML: INTRAMUSCULAR | Qty: 2500 | Status: AC

## 2016-01-31 MED FILL — Lidocaine HCl IV Inj 20 MG/ML: INTRAVENOUS | Qty: 5 | Status: AC

## 2016-01-31 MED FILL — Heparin Sodium (Porcine) Inj 1000 Unit/ML: INTRAMUSCULAR | Qty: 60 | Status: AC

## 2016-01-31 MED FILL — Electrolyte-R (PH 7.4) Solution: INTRAVENOUS | Qty: 4000 | Status: AC

## 2016-01-31 NOTE — Progress Notes (Signed)
CARDIAC REHAB PHASE I   PRE:  Rate/Rhythm: 72 afib    BP: sitting 129/63    SaO2: 97 RA  MODE:  Ambulation: 300 ft   POST:  Rate/Rhythm: 104 afib    BP: sitting 151/85     SaO2: 98 RA  Pt tolerated fairly well but c/o SOB. Needed x1 rest stop due to SOB. Pt required min assist with gait belt to stand (unable to power all the way up). Fairly steady walking with RW, slow pace. VSS. To bed after walk, encouraged x2 more walks today and gave instructions for IS. [place], ACSM 01/31/2016 9:23 AM

## 2016-01-31 NOTE — Progress Notes (Signed)
PT Cancellation Note  Patient Details Name: Bill [name] MRN: [id number] DOB: 1940-12-06   Cancelled Treatment:    Reason Eval/Treat Not Completed: Other (comment) (pt just ambulated with cardiac rehab and unable to be seen at this time. will attempt next date)   Melford Aase 01/31/2016, 10:02 AM Elwyn Reach, [place]

## 2016-01-31 NOTE — Care Management Important Message (Signed)
Important Message  Patient Details  Name: Bill [name] MRN: [id number] Date of Birth: 1941/05/15   Medicare Important Message Given:  Yes    [name] Montine Circle 01/31/2016, 1:41 PM

## 2016-01-31 NOTE — Progress Notes (Signed)
Physical Therapy Treatment Patient Details Name: Bill [name] MRN: [id number] DOB: 1941-03-17 Today's Date: February 14, 2016    History of Present Illness Patient is a 75 year old obese white male with history of aortic stenosis, chronic persistent atrial fibrillation, chronic diastolic congestive heart failure, hypertension, type 2 diabetes mellitus with complications, and stage IIIchronic kidney disease, retinal hemorrhage that required surgical intervention,  admitted with sever symptomatic aortic stenosis; Now s/p Aortic Valve replacement, MAZE, and patent foramen ovale closure; of note, pt has a Neuromuscular disorder w/ resting tremor at baseline    PT Comments    Pt making steady progress.  Follow Up Recommendations  Home health PT;Supervision for mobility/OOB     Equipment Recommendations  3in1 (PT)    Recommendations for Other Services       Precautions / Restrictions Precautions Precautions: Sternal    Mobility  Bed Mobility                  Transfers Overall transfer level: Needs assistance Equipment used: Rolling walker (2 wheeled) Transfers: Sit to/from Stand Sit to Stand: Min assist         General transfer comment: Cues for hand placement and safety  Ambulation/Gait Ambulation/Gait assistance: Supervision Ambulation Distance (Feet): 170 Feet Assistive device: Rolling walker (2 wheeled) Gait Pattern/deviations: Step-through pattern;Decreased stride length Gait velocity: slow Gait velocity interpretation: Below normal speed for age/gender General Gait Details: One standing rest break. Dyspnea 2/4.   Stairs            Wheelchair Mobility    Modified Rankin (Stroke Patients Only)       Balance Overall balance assessment: Needs assistance Sitting-balance support: No upper extremity supported;Feet supported Sitting balance-Leahy Scale: Good     Standing balance support: No upper extremity supported Standing balance-Leahy Scale:  Fair                      Cognition Arousal/Alertness: Awake/alert Behavior During Therapy: WFL for tasks assessed/performed;Flat affect Overall Cognitive Status: Within Functional Limits for tasks assessed                      Exercises      General Comments        Pertinent Vitals/Pain Pain Assessment: No/denies pain    Home Living                      Prior Function            PT Goals (current goals can now be found in the care plan section) Progress towards PT goals: Progressing toward goals    Frequency  Min 3X/week    PT Plan Current plan remains appropriate    Co-evaluation             End of Session Equipment Utilized During Treatment: Gait belt Activity Tolerance: Patient tolerated treatment well Patient left: in chair;with call bell/phone within reach     Time: 1446-1500 PT Time Calculation (min) (ACUTE ONLY): 14 min  Charges:  $Gait Training: 8-22 mins                    G Codes:      Krissie Merrick 02-14-2016, 3:08 PM [employer] PT [phone]

## 2016-01-31 NOTE — Progress Notes (Addendum)
[address]       [place] [address]             [phone]      4 Days Post-Op Procedure(s) (LRB): AORTIC VALVE REPLACEMENT (AVR) (N/A) MAZE (N/A) TRANSESOPHAGEAL ECHOCARDIOGRAM (TEE) (N/A) PATENT FORAMEN OVALE CLOSURE (N/A) Subjective: Feels ok, no specific c/o  Objective: Vital signs in last 24 hours: Temp:  [97.8 F (36.6 C)-98.5 F (36.9 C)] 97.8 F (36.6 C) (09/11 0556) Pulse Rate:  [66-79] 77 (09/11 0556) Cardiac Rhythm: Atrial fibrillation (09/11 0700) Resp:  [15-20] 18 (09/11 0556) BP: (108-159)/(56-99) 122/65 (09/11 0556) SpO2:  [92 %-100 %] 92 % (09/11 0556) Weight:  [265 lb 14.4 oz (120.6 kg)] 265 lb 14.4 oz (120.6 kg) (09/11 0556)  Hemodynamic parameters for last 24 hours:    Intake/Output from previous day: 09/10 0701 - 09/11 0700 In: -  Out: 1020 [Urine:950; Chest Tube:70] Intake/Output this shift: No intake/output data recorded.  General appearance: alert, cooperative and no distress Heart: irregularly irregular rhythm and no murmur Lungs: clear to auscultation bilaterally Abdomen: benign Extremities: + LE edema Wound: incis healing well  Lab Results:  Recent Labs  01/30/16 0637 01/31/16 0548  WBC 10.2 7.9  HGB 9.2* 9.5*  HCT 28.4* 28.6*  PLT 105* 134*   BMET:  Recent Labs  01/30/16 0637 01/31/16 0548  NA 134* 135  K 4.4 4.0  CL 105 103  CO2 21* 24  GLUCOSE 125* 132*  BUN 35* 38*  CREATININE 2.34* 2.11*  CALCIUM 8.4* 8.6*    PT/INR:  Recent Labs  01/31/16 0548  LABPROT 17.7*  INR 1.45   ABG    Component Value Date/Time   PHART 7.302 (L) 01/28/2016 0228   HCO3 19.5 (L) 01/28/2016 0228   TCO2 19 01/28/2016 1656   ACIDBASEDEF 6.0 (H) 01/28/2016 0228   O2SAT 96.0 01/28/2016 0228   CBG (last 3)   Recent Labs  01/30/16 1717 01/30/16 2200 01/31/16 0535  GLUCAP 195* 167* 120*    Meds Scheduled Meds: . acetaminophen  1,000 mg Oral Q6H  . aspirin EC  325 mg Oral Daily  . bisacodyl  10  mg Oral Daily   Or  . bisacodyl  10 mg Rectal Daily  . docusate sodium  200 mg Oral Daily  . dofetilide  250 mcg Oral BID  . furosemide  40 mg Intravenous BID  . furosemide  40 mg Oral BID  . insulin aspart  0-24 Units Subcutaneous TID AC & HS  . mouth rinse  15 mL Mouth Rinse BID  . pantoprazole  40 mg Oral Daily  . sodium chloride flush  3 mL Intravenous Q12H  . sodium chloride flush  3 mL Intravenous Q12H  . vitamin B-12  1,000 mcg Oral Daily  . warfarin  2.5 mg Oral q1800  . Warfarin - Physician Dosing Inpatient   Does not apply q1800   Continuous Infusions: . sodium chloride 250 mL (01/29/16 0700)   PRN Meds:.sodium chloride, metoprolol, morphine injection, ondansetron (ZOFRAN) IV, oxyCODONE, sodium chloride flush, sodium chloride flush, traMADol  Xrays No results found.  Assessment/Plan: S/P Procedure(s) (LRB): AORTIC VALVE REPLACEMENT (AVR) (N/A) MAZE (N/A) TRANSESOPHAGEAL ECHOCARDIOGRAM (TEE) (N/A) PATENT FORAMEN OVALE CLOSURE (N/A)  1 doing well 2 afib with CVR- on tikosyn/coumadin , BP is quite variable currently Syst 108-159 range- monitor 3 H/H is stable, platelet count improved 4 cont diuretic for volume overload- creat is improved 5 sugars fairly well controlled- resume  glucophage today 6 push rehab/pulm toilet- routine  LOS: 4 days    Bill [name],Bill [name] 01/31/2016  I have seen and examined the patient and agree with the assessment and plan as outlined.  Overall looks remarkably good.  Rexene Alberts, MD 01/31/2016 8:44 AM

## 2016-01-31 NOTE — Discharge Instructions (Addendum)
Information on my medicine - Coumadin®   (Warfarin) ° °This medication education was reviewed with me or my healthcare representative as part of my discharge preparation.  The pharmacist that spoke with me during my hospital stay was:  [place], Jennifer Danielle, RPH ° °Why was Coumadin prescribed for you? °Coumadin was prescribed for you because you have a blood clot or a medical condition that can cause an increased risk of forming blood clots. Blood clots can cause serious health problems by blocking the flow of blood to the heart, lung, or brain. Coumadin can prevent harmful blood clots from forming. °As a reminder your indication for Coumadin is:   Blood Clot Prevention After Heart Valve Surgery ° °What test will check on my response to Coumadin? °While on Coumadin (warfarin) you will need to have an INR test regularly to ensure that your dose is keeping you in the desired range. The INR (international normalized ratio) number is calculated from the result of the laboratory test called prothrombin time (PT). ° °If an INR APPOINTMENT HAS NOT ALREADY BEEN MADE FOR YOU please schedule an appointment to have this lab work done by your health care provider within 7 days. °Your INR goal is usually a number between:  2 to 3 or your provider may give you a more narrow range like 2-2.5.  Ask your health care provider during an office visit what your goal INR is. ° °What  do you need to  know  About  COUMADIN? °Take Coumadin (warfarin) exactly as prescribed by your healthcare provider about the same time each day.  DO NOT stop taking without talking to the doctor who prescribed the medication.  Stopping without other blood clot prevention medication to take the place of Coumadin may increase your risk of developing a new clot or stroke.  Get refills before you run out. ° °What do you do if you miss a dose? °If you miss a dose, take it as soon as you remember on the same day then continue your regularly scheduled regimen  the next day.  Do not take two doses of Coumadin at the same time. ° °Important Safety Information °A possible side effect of Coumadin (Warfarin) is an increased risk of bleeding. You should call your healthcare provider right away if you experience any of the following: °? Bleeding from an injury or your nose that does not stop. °? Unusual colored urine (red or dark brown) or unusual colored stools (red or black). °? Unusual bruising for unknown reasons. °? A serious fall or if you hit your head (even if there is no bleeding). ° °Some foods or medicines interact with Coumadin® (warfarin) and might alter your response to warfarin. To help avoid this: °? Eat a balanced diet, maintaining a consistent amount of Vitamin K. °? Notify your provider about major diet changes you plan to make. °? Avoid alcohol or limit your intake to 1 drink for women and 2 drinks for men per day. °(1 drink is 5 oz. wine, 12 oz. beer, or 1.5 oz. liquor.) ° °Make sure that ANY health care provider who prescribes medication for you knows that you are taking Coumadin (warfarin).  Also make sure the healthcare provider who is monitoring your Coumadin knows when you have started a new medication including herbals and non-prescription products. ° °Coumadin® (Warfarin)  Major Drug Interactions  °Increased Warfarin Effect Decreased Warfarin Effect  °Alcohol (large quantities) °Antibiotics (esp. Septra/Bactrim, Flagyl, Cipro) °Amiodarone (Cordarone) °Aspirin (ASA) °Cimetidine (Tagamet) °Megestrol (Megace) °NSAIDs (  ibuprofen, naproxen, etc.) Piroxicam (Feldene) Propafenone (Rythmol SR) Propranolol (Inderal) Isoniazid (INH) Posaconazole (Noxafil) Barbiturates (Phenobarbital) Carbamazepine (Tegretol) Chlordiazepoxide (Librium) Cholestyramine (Questran) Griseofulvin Oral Contraceptives Rifampin Sucralfate (Carafate) Vitamin K   Coumadin (Warfarin) Major Herbal Interactions  Increased Warfarin Effect Decreased Warfarin Effect   Garlic Ginseng Ginkgo biloba Coenzyme Q10 Green tea St. Johns wort    Coumadin (Warfarin) FOOD Interactions  Eat a consistent number of servings per week of foods HIGH in Vitamin K (1 serving =  cup)  Collards (cooked, or boiled & drained) Kale (cooked, or boiled & drained) Mustard greens (cooked, or boiled & drained) Parsley *serving size only =  cup Spinach (cooked, or boiled & drained) Swiss chard (cooked, or boiled & drained) Turnip greens (cooked, or boiled & drained)  Eat a consistent number of servings per week of foods MEDIUM-HIGH in Vitamin K (1 serving = 1 cup)  Asparagus (cooked, or boiled & drained) Broccoli (cooked, boiled & drained, or raw & chopped) Brussel sprouts (cooked, or boiled & drained) *serving size only =  cup Lettuce, raw (green leaf, endive, romaine) Spinach, raw Turnip greens, raw & chopped   These websites have more information on Coumadin (warfarin):  FailFactory.se; VeganReport.com.au;  Aortic Valve Replacement, Care After Refer to this sheet in the next few weeks. These instructions provide you with information on caring for yourself after your procedure. Your health care provider may also give you specific instructions. Your treatment has been planned according to current medical practices, but problems sometimes occur. Call your health care provider if you have any problems or questions after your procedure. HOME CARE INSTRUCTIONS   Take medicines only as directed by your health care provider.  If your health care provider has prescribed elastic stockings, wear them as directed.  Take frequent naps or rest often throughout the day.  Avoid lifting over 10 lbs (4.5 kg) or pushing or pulling things with your arms for 6-8 weeks or as directed by your health care provider.  Avoid driving or airplane travel for 4-6 weeks after surgery or as directed by your health care provider. If you are riding in a car for an extended  period, stop every 1-2 hours to stretch your legs. Keep a record of your medicines and medical history with you when traveling.  Do not drive or operate heavy machinery while taking pain medicine. (narcotics).  Do not cross your legs.  Do not use any tobacco products including cigarettes, chewing tobacco, or electronic cigarettes. If you need help quitting, ask your health care provider.  Do not take baths, swim, or use a hot tub until your health care provider approves. Take showers once your health care provider approves. Pat incisions dry. Do not rub incisions with a washcloth or towel.  Avoid climbing stairs and using the handrail to pull yourself up for the first 2-3 weeks after surgery.  Return to work as directed by your health care provider.  Drink enough fluid to keep your urine clear or pale yellow.  Do not strain to have a bowel movement. Eat high-fiber foods if you become constipated. You may also take a medicine to help you have a bowel movement (laxative) as directed by your health care provider.  Resume sexual activity as directed by your health care provider. Men should not use medicines for erectile dysfunction until their doctor says it isokay.  If you had a certain type of heart condition in the past, you may need to take antibiotic medicine before having dental work or  surgery. Let your dentist and health care providers know if you had one or more of the following:  Previous endocarditis.  An artificial (prosthetic) heart valve.  Congenital heart disease. SEEK MEDICAL CARE IF:  You develop a skin rash.   You experience sudden changes in your weight.  You have a fever. SEEK IMMEDIATE MEDICAL CARE IF:   You develop chest pain that is not coming from your incision.  You have drainage (pus), redness, swelling, or pain at your incision site.   You develop shortness of breath or have difficulty breathing.   You have increased bleeding from your incision  site.   You develop light-headedness.  MAKE SURE YOU:   Understand these directions.  Will watch your condition.  Will get help right away if you are not doing well or get worse.   This information is not intended to replace advice given to you by your health care provider. Make sure you discuss any questions you have with your health care provider.   Document Released: 11/24/2004 Document Revised: 05/29/2014 Document Reviewed: 02/20/2012 Elsevier Interactive Patient Education [employer].

## 2016-02-01 LAB — GLUCOSE, CAPILLARY
GLUCOSE-CAPILLARY: 173 mg/dL — AB (ref 65–99)
Glucose-Capillary: 149 mg/dL — ABNORMAL HIGH (ref 65–99)
Glucose-Capillary: 157 mg/dL — ABNORMAL HIGH (ref 65–99)
Glucose-Capillary: 154 mg/dL — ABNORMAL HIGH (ref 65–99)

## 2016-02-01 LAB — PROTIME-INR
INR: 1.52
Prothrombin Time: 18.4 seconds — ABNORMAL HIGH (ref 11.4–15.2)

## 2016-02-01 MED ORDER — VITAMIN D 1000 UNITS PO TABS
1000.0000 [IU] | ORAL_TABLET | Freq: Every morning | ORAL | Status: DC
  Administered 2016-02-02: 1000 [IU] via ORAL
  Filled 2016-02-01: qty 1

## 2016-02-01 MED ORDER — GLIPIZIDE 5 MG PO TABS: 5.0000 mg | ORAL_TABLET | Freq: Every day | ORAL | Status: DC

## 2016-02-01 MED ORDER — ADULT MULTIVITAMIN W/MINERALS CH
1.0000 | ORAL_TABLET | Freq: Every day | ORAL | Status: DC
  Administered 2016-02-02: 1 via ORAL
  Filled 2016-02-01 (×2): qty 1

## 2016-02-01 MED ORDER — OXYBUTYNIN CHLORIDE 5 MG PO TABS
5.0000 mg | ORAL_TABLET | Freq: Three times a day (TID) | ORAL | Status: DC
  Administered 2016-02-02: 5 mg via ORAL
  Filled 2016-02-01: qty 1

## 2016-02-01 MED ORDER — TRAMADOL HCL 50 MG PO TABS: 50.0000 mg | ORAL_TABLET | Freq: Two times a day (BID) | ORAL | Status: DC | PRN

## 2016-02-01 MED ORDER — GLIPIZIDE 10 MG PO TABS
10.0000 mg | ORAL_TABLET | Freq: Every day | ORAL | Status: DC
  Administered 2016-02-02: 10 mg via ORAL
  Filled 2016-02-01: qty 1

## 2016-02-01 MED ORDER — ASPIRIN EC 81 MG PO TBEC
81.0000 mg | DELAYED_RELEASE_TABLET | Freq: Every day | ORAL | Status: DC
  Administered 2016-02-01 – 2016-02-02 (×2): 81 mg via ORAL
  Filled 2016-02-01: qty 1

## 2016-02-01 MED ORDER — GLIPIZIDE 5 MG PO TABS: 5.0000 mg | ORAL_TABLET | Freq: Two times a day (BID) | ORAL | Status: DC

## 2016-02-01 MED ORDER — FUROSEMIDE 40 MG PO TABS
40.0000 mg | ORAL_TABLET | Freq: Every day | ORAL | Status: DC
  Administered 2016-02-02: 40 mg via ORAL
  Filled 2016-02-01: qty 1

## 2016-02-01 NOTE — Progress Notes (Signed)
CARDIAC REHAB PHASE I   PRE:  Rate/Rhythm: 77 afib    BP: sitting 141/68    SaO2: 97 RA  MODE:  Ambulation: 350 ft   POST:  Rate/Rhythm: 115 afib    BP: sitting 163/77     SaO2: 100 RA  Pt able to stand almost independently, min assist to power up. Steady walking with RW. C/o SOB toward end and did not want to increase distance more. Return to recliner. Has RW at home.  [place], ACSM 02/01/2016 11:57 AM

## 2016-02-01 NOTE — Progress Notes (Addendum)
      [address]       [place] [address]             [phone]        5 Days Post-Op Procedure(s) (LRB): AORTIC VALVE REPLACEMENT (AVR) (N/A) MAZE (N/A) TRANSESOPHAGEAL ECHOCARDIOGRAM (TEE) (N/A) PATENT FORAMEN OVALE CLOSURE (N/A)  Subjective: Patient states his breathing is "ok". Has no complaints this am.  Objective: Vital signs in last 24 hours: Temp:  [98.1 F (36.7 C)-98.7 F (37.1 C)] 98.5 F (36.9 C) (09/12 0531) Pulse Rate:  [62-97] 71 (09/12 0531) Cardiac Rhythm: Atrial fibrillation (09/11 1945) Resp:  [18] 18 (09/12 0531) BP: (118-154)/(57-83) 150/65 (09/12 0531) SpO2:  [95 %-98 %] 95 % (09/12 0531) Weight:  [261 lb 0.4 oz (118.4 kg)] 261 lb 0.4 oz (118.4 kg) (09/12 0531)  Pre op weight 118 kg Current Weight  02/01/16 261 lb 0.4 oz (118.4 kg)      Intake/Output from previous day: 09/11 0701 - 09/12 0700 In: 1200 [P.O.:1200] Out: 1701 [Urine:1700; Stool:1]   Physical Exam:  Cardiovascular: IRRR IRRR, no murmur Pulmonary: Clear to auscultation bilaterally Abdomen: Soft, non tender, bowel sounds present. Extremities: Mild bilateral lower extremity edema. Wound: Clean and dry.  No erythema or signs of infection.  Lab Results: CBC: Recent Labs  01/30/16 0637 01/31/16 0548  WBC 10.2 7.9  HGB 9.2* 9.5*  HCT 28.4* 28.6*  PLT 105* 134*   BMET:  Recent Labs  01/30/16 0637 01/31/16 0548  NA 134* 135  K 4.4 4.0  CL 105 103  CO2 21* 24  GLUCOSE 125* 132*  BUN 35* 38*  CREATININE 2.34* 2.11*  CALCIUM 8.4* 8.6*    PT/INR:  Lab Results  Component Value Date   INR 1.52 02/01/2016   INR 1.45 01/31/2016   INR 1.37 01/30/2016   ABG:  INR: Will add last result for INR, ABG once components are confirmed Will add last 4 CBG results once components are confirmed  Assessment/Plan:  1. CV - A fib with HR 70's. On Tikosyn 250 mcg bid and Coumadin. INR slightly increased from 1.45 to 1.52. Will continue with Coumadin 2.5  mg daily. Will decrease ecasa to 81 mg daily as is on Coumadin. 2.  Pulmonary - On room air. Encourage incentive spirometer. 3. Volume Overload - On Lasix 40 mg bid. 4.  Acute blood loss anemia - Last H and H stable at 9.5 and 28.6 5. Thrombocytopenia-last platelets up to 134,000. 6. DM-CBGs 170/127/149. On Metformin 500 mg bid and Insulin. Pre op HGA1C  6.4. 7. CKD stage III-baseline creatinine 1.5-1.8. Creatinine yesterday down to 2.11. Not drawn today. Will recheck in am.  ZIMMERMAN,DONIELLE MPA-C 02/01/2016,7:51 AM  I have seen and examined the patient and agree with the assessment and plan as outlined.  Doing well.  Anticipate possible d/c home 1-2 days.  Rexene Alberts, MD 02/01/2016 11:04 AM

## 2016-02-01 NOTE — Discharge Summary (Signed)
Physician Discharge Summary       [address]       [place] [address]             [phone]    Patient ID: Bill [name] MRN: [id number] DOB/AGE: 1941-03-22 75 y.o.  Admit date: 01/27/2016 Discharge date: 02/02/2016  Admission Diagnoses: 1. Severe aortic stenosis 2. Recurrent, persistent atrial fibrillation  Active Diagnoses:  1. PFO 2. Type 2 diabetes, controlled, with retinopathy ([place]) 3. HLD (hyperlipidemia) 4. Obesity, Class II, BMI 35-39.9, with comorbidity ([place]) 5. Essential hypertension 6. Degenerative arthritis of hip 7. CKD stage 3 due to type 2 diabetes mellitus ([place]) 8. Chronic diastolic heart failure ([place]) 9. Diabetic peripheral neuropathy associated with type 2 diabetes mellitus ([place]) 10. ABL anemia 11. Mild thrombocytopenia   Procedure (s):   Aortic Valve Replacement                       Edwards Magna Ease Pericardial Tissue Valve (size 27 mm, model # 3300TFX, serial # [id number])   Maze Procedure                       Complete bilateral atrial lesion set using cryothermy and bipolar radiofrequency ablation                       Clipping of Left Atrial Appendage (Atriclip, size 7mm)   Closure of Patent Foramen Ovale by Dr. Roxy Manns on 01/27/2016  History of Presenting Illness: Patient is a 75 year old obese white male with history of aortic stenosis, chronic persistent atrial fibrillation, chronic diastolic congestive heart failure, hypertension, type 2 diabetes mellitus with complications, and stage IIIchronic kidney disease who has been referred for surgical consultation to discuss treatment options for management of severe symptomatic aortic stenosis. The patient has a long-standing history of chronic diastolic congestive heart failure and atrial fibrillation. He has been followed for several years by Dr. Lovena Le. Transthoracic echocardiograms have suggested the presence of moderate aortic stenosis with preserved left ventricular  systolic function. The patient also has been treated for atrial fibrillation which has failed medical therapy with Tikosyn. He has been chronically anticoagulated using Eliquis but he recently suffered a retinal hemorrhage that required surgical intervention. The patient states that he has been instructed to resume taking Eliquis at the previous dose but he has decreased the dose on his own because of concerns regarding risk of bleeding. He was referred to Dr. [name] to consider placement of a Watchman left atrial occluder. Concerns were raised regarding whether or not the patient's aortic stenosis might be severe. The patient subsequently underwent transesophageal echocardiogram which confirmed the presence of severe aortic stenosis. The patient was referred for surgical consultation to discuss treatment options further.  The patient is married and lives locally with his wife in [place]. 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 He retired in 1989. He has been obese for much of his adult life. He does not exercise on a regular basis although he does enjoy gardening and yard work. He reports a long-standing history of exertional shortness of breath dating back at least 4 or 5 years. Symptoms have been slowly progressing. He now gets short of breath with moderate level activity and this limits his physical activities to a significant degree. He gets short of breath just walking to the mailbox and back. He denies any  history of resting shortness of breath, PND, orthopnea, dizzy spells, syncope, or lower extremity edema. He has never had any chest pain or chest tightness either with activity or at rest. He does not experience palpitations and cannot tell when he is in sinus rhythm or atrial fibrillation. His activity level is also notable for some problems with arthritis as well as poor balance. He has a chronic tremor particularly  involving his right arm.  He was originally seen in consultation on 12/27/2015. Since then, the patient underwent CT scan of the chest, cardiac catheterization, and a formal physical therapy evaluation. He returns to our office today to review the results of these tests and discuss treatment options further. He reports no new problems or complaints since his last visit.  The patient and his wife were againcounseled at length regarding treatment alternatives for management of severe aortic stenosis including continued medical therapy versus proceeding with aortic valve replacement in the near future. The natural history of aortic stenosis was reviewed, as was long term prognosis with medical therapy alone. Surgical options were discussed at length including conventional surgical aortic valve replacement through either a full median sternotomy or using minimally invasive techniques. Other alternatives including rapid-deployment bioprosthetic tissue valve replacement, transcatheter aortic valve replacement, patch enlargement of the aortic root, stentless porcine aortic root replacement, valve repair, the Ross autograft procedure, and homograft aortic root replacement were discussed. Discussion was held comparing the relative risks of mechanical valve replacement with need for lifelong anticoagulation versus use of a bioprosthetic tissue valve and the associated potential for late structural valve deterioration and failure. This discussion was placed in the context of the patient's particular circumstances, and as a result the patient specifically requests that their valve be replaced using a bioprosthetic tissue valve. The patient understands and accepts all potential associated risks of surgery. He was admitted on 01/27/2016 in order to undergo an AVR, MAZE, and closure of PFO.  Brief Hospital Course:  The patient was extubated early the morning of post operative day one without difficulty. He remained  afebrile and hemodynamically stable. He was weaned off of Neo Synephrine drip and was initially pacer dependent with a slow junctional rhythm. He then maintained SR with a first degree AV block;however, he went back into a fib with a controlled ventricular rate. Gordy Councilman, a line, chest tubes, and foley were removed early in the post operative course. Tikosyn was restarted. He had a history of CKD stage III (baseline creatinine around 1.5-1.8). His creatinine went as high as 2.24 post op, but gradually decreased. His last creatinine was down to 1.72. He had ABL anemia. He did require a post op transfusion on 01/29/2016. His last H and H was stable at 9.5 and 28.6. He had mild thrombocytopenia. His last platelet count was up to 134,000. He was weaned off the insulin drip.  The patient's HGA1C pre op was  6.4. Once he was tolerating a diet, Metformin and Insulin were restarted.  The patient's glucose remained well controlled. He was volume overloaded and diuresed. He was started on Coumadin and his PT and INR were monitored daily. His last INR was 1.68 and he is currently on Coumadin 2.5 mg daily. The patient was felt surgically stable for transfer from the ICU to PCTU for further convalescence on 01/30/2016. He continues to progress with cardiac rehab. He was ambulating on room air. He has been tolerating a diet and has had a bowel movement. Epicardial pacing wires have been removed. Chest tube  sutures will be removed prior to discharge. The patient is felt surgically stable for discharge today.   Latest Vital Signs: Blood pressure 134/62, pulse 73, temperature 97.4 F (36.3 C), temperature source Oral, resp. rate 18, height 6\' 2"  (1.88 m), weight 260 lb 14.4 oz (118.3 kg), SpO2 95 %.  Physical Exam: Cardiovascular: IRRR IRRR, no murmur Pulmonary: Clear to auscultation bilaterally Abdomen: Soft, non tender, bowel sounds present. Extremities: Mild bilateral lower extremity edema. Wound: Clean and dry.  No  erythema or signs of infection.  Discharge Condition:Stable and discharged to home.  Recent laboratory studies:  Lab Results  Component Value Date   WBC 7.9 01/31/2016   HGB 9.5 (L) 01/31/2016   HCT 28.6 (L) 01/31/2016   MCV 98.3 01/31/2016   PLT 134 (L) 01/31/2016   Lab Results  Component Value Date   NA 137 02/02/2016   K 4.2 02/02/2016   CL 101 02/02/2016   CO2 26 02/02/2016   CREATININE 1.72 (H) 02/02/2016   GLUCOSE 157 (H) 02/02/2016    Diagnostic Studies: Dg Chest 2 View  Result Date: 01/31/2016 CLINICAL DATA:  Atelectasis.  Recent aortic valve replacement. EXAM: CHEST  2 VIEW COMPARISON:  01/29/2016 FINDINGS: Aortic valve prosthesis and atrial clip observed. Epicardial pacer leads noted. Right IJ introducer sheath and prior chest tubes and mediastinal drain removed. Atherosclerotic aortic arch. Small bilateral pleural effusions with associated passive atelectasis. No pneumothorax. Thoracic spondylosis. Mild enlargement of the cardiopericardial silhouette , no edema. IMPRESSION: 1. Small residual bilateral pleural effusions with associated passive atelectasis. Mild enlargement of the cardiopericardial silhouette, without edema. 2. Aortic valve prosthesis and atrial clip noted. 3. Atherosclerotic aortic arch. Electronically Signed   By: Van Clines M.D.   On: 01/31/2016 08:06   Discharge Instructions    AMB Referral to [facility]    Complete by:  As directed    Reason for consult:  Post hospital monitoring; HF; post Aortic valve   Diagnoses of:  Heart Failure   Expected date of contact:  1-3 days (reserved for hospital discharges)   Please assign to community nurse for transition of care calls and assess for home visits. Questions please call:   Natividad Brood, RN BSN [facility] Liaison  [phone] business mobile phone Toll free office [phone]     Discharge Medications:   Medication List    STOP taking these medications     apixaban 5 MG Tabs tablet Commonly known as:  ELIQUIS   ibuprofen 400 MG tablet Commonly known as:  ADVIL,MOTRIN     TAKE these medications   aspirin 81 MG EC tablet Take 1 tablet (81 mg total) by mouth daily.   cholecalciferol 1000 units tablet Commonly known as:  VITAMIN D Take 1,000 Units by mouth every morning.   dofetilide 250 MCG capsule Commonly known as:  TIKOSYN Take 1 capsule (250 mcg total) by mouth 2 (two) times daily.   furosemide 20 MG tablet Commonly known as:  LASIX Take 20 mg by mouth daily. Per Patient, Dr. Lovena Le stated he could take 40 mg per day by mouth, but patient doesn't always take 40mg  by mouth daily   glipiZIDE 10 MG tablet Commonly known as:  GLUCOTROL Take 5-10 mg by mouth 2 (two) times daily before a meal. Take 10 mg by mouth daily with breakfast, 5 mg daily with dinner   glucose blood test strip Commonly known as:  ONE TOUCH ULTRA TEST Use to check sugar once daily and as needed.  Dx:E11.319 **One Touch Ultra**   Magnesium Oxide 250 MG Tabs Take 1 tablet by mouth daily as needed (supplement).   metFORMIN 500 MG tablet Commonly known as:  GLUCOPHAGE Take 500 mg by mouth 2 (two) times daily with a meal.   multivitamin with minerals Tabs tablet Take by mouth daily. 1 packet of 5 vitamins daily   multivitamin-lutein Caps capsule Take 3 capsules by mouth daily.   oxybutynin 5 MG tablet Commonly known as:  DITROPAN Take 1 tablet (5 mg total) by mouth 3 (three) times daily.   oxyCODONE 5 MG immediate release tablet Commonly known as:  Oxy IR/ROXICODONE Take 1 tablet (5 mg total) by mouth every 4 (four) hours as needed for severe pain.   vitamin B-12 1000 MCG tablet Commonly known as:  CYANOCOBALAMIN Take 1,000 mcg by mouth daily.   warfarin 2.5 MG tablet Commonly known as:  COUMADIN Take 1 tablet (2.5 mg total) by mouth daily at 6 PM. Or as directed.       Follow Up Appointments: Follow-up Information    Rexene Alberts, MD  Follow up on 03/06/2016.   Specialty:  Cardiothoracic Surgery Why:  PA/LAT CXR to be taken (at [facility] which is in the same building as Dr. Guy Sandifer office) on 03/06/2016 at 9:30 am;Appointment time is at 10:00 am Contact information: [place] [address] [phone]        [name], MD .   Specialty:  Cardiology Why:  PT and INR (as is on Coumadin for a fib) needs to be drawn on Friday 02/04/2016. Also, call to make a follow up appointment for 2 weeks Contact information: [address] [place] [address] [phone]         The patient has been discharged on:   1.Beta Blocker:  Yes [  x ]                              No   [   ]                              If No, reason:  2.Ace Inhibitor/ARB: Yes [   ]                                     No  [  x  ]                                     If No, reason:Labile BP, elevated creatinine  3.Statin:   Yes [   ]                  No  [ x  ]                  If No, reason:No CAD  4.S[place]a Commons:  Yes  [ x  ]                  No   [   ]                  If No, reason:  Signed: ZIMMERMAN,DONIELLE MPA-C 02/02/2016, 8:35 AM

## 2016-02-01 NOTE — Consult Note (Signed)
   [facility] Inpatient Consult   02/01/2016  DAVINDER MIGNEAULT March 23, 1941 [id number]  Patient evaluated for community based chronic disease management services with [place] [facility] as a benefit of patient's [employer] Medicare Insurance. Chart review reveals the Patient is a 75 year old obese white male with history of aortic stenosis, chronic persistent atrial fibrillation, chronic diastolic congestive heart failure, hypertension, type 2 diabetes mellitus with complications, and stage IIIchronic kidney disease Who has been referred for surgical consultation to discuss treatment options for management of severe symptomatic aortic stenosis. The patient has a long-standing history of chronic diastolic congestive heart failure and atrial fibrillation. Spoke with patient at bedside to explain [place] Management services. Patient states, "I would like a nurse to follow up on me when I get home."   Patient will receive post hospital discharge call and will be evaluated for monthly home visits for assessments and disease process education. Consent form signed.   Left contact information and [facility] literature at bedside. Made Inpatient Case Manager aware that [place] Management following. Of note, [facility] Care Management services does not replace or interfere with any services that are arranged by inpatient case management or social work.  For additional questions or referrals please contact:     Natividad Brood, RN BSN [facility] Liaison  [phone] business mobile phone Toll free office [phone]

## 2016-02-02 LAB — PROTIME-INR
INR: 1.68
PROTHROMBIN TIME: 20 s — AB (ref 11.4–15.2)

## 2016-02-02 LAB — BASIC METABOLIC PANEL
ANION GAP: 10 (ref 5–15)
BUN: 32 mg/dL — ABNORMAL HIGH (ref 6–20)
CALCIUM: 9 mg/dL (ref 8.9–10.3)
CO2: 26 mmol/L (ref 22–32)
Chloride: 101 mmol/L (ref 101–111)
Creatinine, Ser: 1.72 mg/dL — ABNORMAL HIGH (ref 0.61–1.24)
GFR calc Af Amer: 42 mL/min — ABNORMAL LOW (ref >60)
GFR calc non Af Amer: 37 mL/min — ABNORMAL LOW (ref >60)
GLUCOSE: 157 mg/dL — AB (ref 65–99)
Potassium: 4.2 mmol/L (ref 3.5–5.1)
Sodium: 137 mmol/L (ref 135–145)

## 2016-02-02 LAB — GLUCOSE, CAPILLARY
GLUCOSE-CAPILLARY: 122 mg/dL — AB (ref 65–99)
Glucose-Capillary: 142 mg/dL — ABNORMAL HIGH (ref 65–99)

## 2016-02-02 MED ORDER — INFLUENZA VAC SPLIT QUAD 0.5 ML IM SUSY
0.5000 mL | PREFILLED_SYRINGE | Freq: Once | INTRAMUSCULAR | Status: AC
  Administered 2016-02-02: 0.5 mL via INTRAMUSCULAR

## 2016-02-02 MED ORDER — ASPIRIN 81 MG PO TBEC: 81.0000 mg | DELAYED_RELEASE_TABLET | Freq: Every day | ORAL | Status: DC

## 2016-02-02 MED ORDER — WARFARIN SODIUM 2.5 MG PO TABS: 2.5000 mg | ORAL_TABLET | Freq: Every day | ORAL | 1 refills | Status: DC

## 2016-02-02 MED ORDER — OXYCODONE HCL 5 MG PO TABS: 5.0000 mg | ORAL_TABLET | ORAL | 0 refills | Status: DC | PRN

## 2016-02-02 MED ORDER — INFLUENZA VAC SPLIT QUAD 0.5 ML IM SUSY
0.5000 mL | PREFILLED_SYRINGE | INTRAMUSCULAR | Status: DC
Start: 2016-02-03 — End: 2016-02-02

## 2016-02-02 NOTE — Care Management Important Message (Signed)
Important Message  Patient Details  Name: Bill [name] MRN: [id number] Date of Birth: [birth year]   Medicare Important Message Given:  Yes    [name] May 02/02/2016, 10:59 AM

## 2016-02-02 NOTE — Care Management Note (Signed)
Case Management Note Previous CM note initiated by Maryclare Labrador, RN 01/28/2016, 11:10 AM   Patient Details  Name: Bill [name] MRN: [id number] Date of Birth: 1941/03/08  Subjective/Objective:   S/p AVR and MAZE                 Action/Plan:  PTA from home independent with wife, already has walker and 3:1 in the home but doesn't use them currently.  Wife will be with pt at discharge.  CM will continue to monitor for disposition needs   Expected Discharge Date:     02/02/16             Expected Discharge Plan:  Home/Self Care  In-House Referral:     Discharge planning Services  CM Consult  Post Acute Care Choice:    Choice offered to:     DME Arranged:    DME Agency:     HH Arranged:    [place] Agency:     Status of Service:  Completed, signed off  If discussed at [place] of Stay Meetings, dates discussed:    Discharge Disposition: Home self care   Additional Comments:  Dawayne Patricia, RN 02/02/2016, 11:58 AM [phone]

## 2016-02-02 NOTE — Progress Notes (Addendum)
      [address]       [place] [address]             [phone]        6 Days Post-Op Procedure(s) (LRB): AORTIC VALVE REPLACEMENT (AVR) (N/A) MAZE (N/A) TRANSESOPHAGEAL ECHOCARDIOGRAM (TEE) (N/A) PATENT FORAMEN OVALE CLOSURE (N/A)  Subjective: Patient about to eat breakfast. No specific complaints this am.  Objective: Vital signs in last 24 hours: Temp:  [97.4 F (36.3 C)-97.8 F (36.6 C)] 97.4 F (36.3 C) (09/13 0522) Pulse Rate:  [52-73] 73 (09/13 0522) Cardiac Rhythm: Atrial fibrillation (09/12 2000) Resp:  [17-18] 18 (09/13 0522) BP: (121-134)/(54-62) 134/62 (09/13 0522) SpO2:  [95 %-96 %] 95 % (09/13 0522) Weight:  [260 lb 14.4 oz (118.3 kg)] 260 lb 14.4 oz (118.3 kg) (09/13 0522)  Pre op weight 118 kg Current Weight  02/02/16 260 lb 14.4 oz (118.3 kg)      Intake/Output from previous day: 09/12 0701 - 09/13 0700 In: 600 [P.O.:600] Out: 1775 [Urine:1775]   Physical Exam:  Cardiovascular: IRRR IRRR, no murmur Pulmonary: Clear to auscultation bilaterally Abdomen: Soft, non tender, bowel sounds present. Extremities: Mild bilateral lower extremity edema. Wound: Clean and dry.  No erythema or signs of infection.  Lab Results: CBC:  Recent Labs  01/31/16 0548  WBC 7.9  HGB 9.5*  HCT 28.6*  PLT 134*   BMET:   Recent Labs  01/31/16 0548 02/02/16 0336  NA 135 137  K 4.0 4.2  CL 103 101  CO2 24 26  GLUCOSE 132* 157*  BUN 38* 32*  CREATININE 2.11* 1.72*  CALCIUM 8.6* 9.0    PT/INR:  Lab Results  Component Value Date   INR 1.68 02/02/2016   INR 1.52 02/01/2016   INR 1.45 01/31/2016   ABG:  INR: Will add last result for INR, ABG once components are confirmed Will add last 4 CBG results once components are confirmed  Assessment/Plan:  1. CV - A fib with HR 60-70's. On Tikosyn 250 mcg bid and Coumadin. INR slightly increased from 1.52 to 1.68. Will continue with Coumadin 2.5 mg daily.  2.  Pulmonary - On room  air. Encourage incentive spirometer. 3. Volume Overload - On Lasix 40 mg daily. Prior to surgery, he was taking 20-40 mg daily depending on weight so will resume this at discharge. 4.  Acute blood loss anemia - Last H and H stable at 9.5 and 28.6 5. Thrombocytopenia-last platelets up to 134,000. 6. DM-CBGs 170/127/149. On Metformin 500 mg bid, Glucotrol 5 mg daily, and Insulin. Pre op HGA1C  6.4. 7. CKD stage III-baseline creatinine 1.5-1.8. Creatinine down to 1.72. 8. Will discuss discharge disposition with Dr. Haynes Bast today vs am  ZIMMERMAN,DONIELLE MPA-C 02/02/2016,8:06 AM   I have seen and examined the patient and agree with the assessment and plan as outlined.  D/C home today.  Rexene Alberts, MD 02/02/2016 10:17 AM

## 2016-02-02 NOTE — Progress Notes (Signed)
Patient in a stable condition, this RN went over discharge instructions with patient and wife at bedside, they verbalised understanding, paper priscriptions given to patient, iv removed, tele dc ccmd notified, patient belongings at bedside, patient taken off the floor by a volunteer on a wheelchair by a volunteer

## 2016-02-02 NOTE — Progress Notes (Signed)
Physical Therapy Treatment Patient Details Name: Bill [name] MRN: [id number] DOB: 05-Jul-1940 Today's Date: 02/02/2016    History of Present Illness Patient is a 75 year old obese white male with history of aortic stenosis, chronic persistent atrial fibrillation, chronic diastolic congestive heart failure, hypertension, type 2 diabetes mellitus with complications, and stage IIIchronic kidney disease, retinal hemorrhage that required surgical intervention,  admitted with sever symptomatic aortic stenosis; Now s/p Aortic Valve replacement, MAZE, and patent foramen ovale closure; of note, pt has a Neuromuscular disorder w/ resting tremor at baseline    PT Comments    Pt ambulated 250' with RW and min-guard A as well as ascending and descending 4 stairs. Discussed d/c plan and car transfer, reviewed sternal precautions, and use of RW as pt tends to leave it and walk around room without it. PT will continue to follow.   Follow Up Recommendations  Home health PT;Supervision for mobility/OOB     Equipment Recommendations  3in1 (PT)    Recommendations for Other Services OT consult     Precautions / Restrictions Precautions Precautions: Sternal Precaution Comments: reviewed precautions again with pt (he could only state, "I can't chop any wood") Restrictions Weight Bearing Restrictions: Yes Other Position/Activity Restrictions: sternal precautions    Mobility  Bed Mobility Overal bed mobility: Needs Assistance Bed Mobility: Sit to Supine       Sit to supine: Min assist   General bed mobility comments: could not get both legs into bed, min A given, vc's for maintaining precautions with repositioning in bed  Transfers Overall transfer level: Needs assistance Equipment used: None Transfers: Sit to/from Stand Sit to Stand: Min guard         General transfer comment: pt stood from bed, toilet, and recliner with min-guard without use of hands, uses momentum and takes increased  time but was successful without breaking precautions  Ambulation/Gait Ambulation/Gait assistance: Min guard Ambulation Distance (Feet): 250 Feet Assistive device: Rolling walker (2 wheeled) Gait Pattern/deviations: Step-through pattern Gait velocity: decreased Gait velocity interpretation: Below normal speed for age/gender General Gait Details: 2 standing rest breaks 2/4 DOE. ALso rested 1 min before and after stairs. Pt reports back pain with increased ambulation and that this is baseline for him   Stairs Stairs: Yes Stairs assistance: Min guard Stair Management: One rail Right;Step to pattern;Forwards Number of Stairs: 4 General stair comments: handles stairs well with use of rail, HR peaked at 116 bpm (was in a-fib, though this was the case when he first got up and went to the bathroom)  Wheelchair Mobility    Modified Rankin (Stroke Patients Only)       Balance Overall balance assessment: Needs assistance Sitting-balance support: No upper extremity supported Sitting balance-Leahy Scale: Good     Standing balance support: No upper extremity supported Standing balance-Leahy Scale: Fair Standing balance comment: moving about room without RW with no LOB, but can be slightly impulsive with movement so encouraged to use RW                    Cognition Arousal/Alertness: Awake/alert Behavior During Therapy: WFL for tasks assessed/performed;Flat affect Overall Cognitive Status: Within Functional Limits for tasks assessed (slightly slow to respond)       Memory: Decreased recall of precautions              Exercises      General Comments General comments (skin integrity, edema, etc.): HR 76-116 bpm, O2 sats 98% on RA  Pertinent Vitals/Pain Pain Assessment: No/denies pain    Home Living Family/patient expects to be discharged to:: Private residence Living Arrangements: Spouse/significant other Available Help at Discharge: Family;Available 24  hours/day                Prior Function            PT Goals (current goals can now be found in the care plan section) Acute Rehab PT Goals PT Goal Formulation: With patient Time For Goal Achievement: 02/13/16 Potential to Achieve Goals: Good Progress towards PT goals: Progressing toward goals    Frequency  Min 3X/week    PT Plan Current plan remains appropriate    Co-evaluation             End of Session Equipment Utilized During Treatment: Gait belt Activity Tolerance: Patient tolerated treatment well Patient left: in bed;with call bell/phone within reach;with family/visitor present     Time: [id number] PT Time Calculation (min) (ACUTE ONLY): 33 min  Charges:  $Gait Training: 23-37 mins                    G Codes:     Leighton Roach, PT  Acute Rehab Services  [phone]  Leighton Roach 02/02/2016, 12:39 PM

## 2016-02-02 NOTE — Progress Notes (Signed)
Ed with Pt and wife.  Pt and wife were understanding of ed.  Discussed [facility] and pt is interested.  Referral to [facility] [place].  Mentioned dc video but pt declined.   [id number]  Cleda Mccreedy, MS (orientation) Yves Dill CES, ACSM 12:51 PM 02/02/2016

## 2016-02-03 ENCOUNTER — Other Ambulatory Visit: Payer: Self-pay

## 2016-02-03 NOTE — Patient Outreach (Signed)
[facility]) Care Management  02/03/2016  Bill [name] May 08, 1941 [id number]   Assessment: 75 year old with recent admission 9/7-9/13 due to severe symptomatic aortic stenosis, persistent atrial fibrillation. Open heart surgery during admission. RNCM called to complete transition of care. member reports some chest soreness, bur denies it as any concern. Member reports he took tylenol for this and has not taken anything else for this discomfort. Member denies any questions or concerns.  No questions or concerns.  RNCM contact number and 24hour nurse advice line provided. RNCM also provided assigned care coordinator contact information.  Plan: update assigned care coordinator.  Covering RNCM: Thea Silversmith, RN, MSN, [place] Coordinator Cell: [phone]

## 2016-02-04 ENCOUNTER — Other Ambulatory Visit: Payer: Self-pay

## 2016-02-04 ENCOUNTER — Telehealth: Payer: Self-pay | Admitting: Cardiovascular Disease

## 2016-02-04 NOTE — Telephone Encounter (Signed)
New message    Pt was just released from the hosp and states that his discharge papers say that he has an appt w/Dr. [name] today. It is not showing in the system. He would like to speak to the nurse.

## 2016-02-04 NOTE — Telephone Encounter (Signed)
I spoke with the pt and made him aware that he does not have a pending appointment with Dr [name] today.  I did advise him of the scheduled follow-up on 9/18 for INR, 9/27 Richardson Dopp PA-C and 10/16 with Dr Roxy Manns.  Patient contacted regarding discharge from [facility] on 02/02/2016.  Patient understands to follow up with provider.  All appointment reviewed with the pt.  Patient understands discharge instructions? yes Patient understands medications and regiment? yes Patient understands to bring all medications to this visit? yes

## 2016-02-07 ENCOUNTER — Ambulatory Visit (INDEPENDENT_AMBULATORY_CARE_PROVIDER_SITE_OTHER): Payer: PPO | Admitting: Pharmacist

## 2016-02-07 ENCOUNTER — Encounter: Payer: Self-pay | Admitting: Cardiovascular Disease

## 2016-02-07 ENCOUNTER — Ambulatory Visit (INDEPENDENT_AMBULATORY_CARE_PROVIDER_SITE_OTHER): Payer: PPO | Admitting: Cardiovascular Disease

## 2016-02-07 ENCOUNTER — Other Ambulatory Visit: Payer: Self-pay

## 2016-02-07 ENCOUNTER — Ambulatory Visit
Admission: RE | Admit: 2016-02-07 | Discharge: 2016-02-07 | Disposition: A | Discharge status: Home / Self Care | Payer: PPO | Source: Ambulatory Visit | Attending: Cardiovascular Disease | Admitting: Cardiovascular Disease

## 2016-02-07 ENCOUNTER — Ambulatory Visit (HOSPITAL_COMMUNITY): Payer: PPO | Attending: Cardiology

## 2016-02-07 VITALS — BP 124/70 | HR 94 | Ht 74.0 in | Wt 259.8 lb

## 2016-02-07 DIAGNOSIS — I359 Nonrheumatic aortic valve disorder, unspecified: Secondary | ICD-10-CM

## 2016-02-07 DIAGNOSIS — Z8679 Personal history of other diseases of the circulatory system: Secondary | ICD-10-CM

## 2016-02-07 DIAGNOSIS — R0602 Shortness of breath: Secondary | ICD-10-CM | POA: Diagnosis not present

## 2016-02-07 DIAGNOSIS — R0600 Dyspnea, unspecified: Secondary | ICD-10-CM

## 2016-02-07 DIAGNOSIS — Z9889 Other specified postprocedural states: Secondary | ICD-10-CM

## 2016-02-07 DIAGNOSIS — Z952 Presence of prosthetic heart valve: Secondary | ICD-10-CM | POA: Insufficient documentation

## 2016-02-07 DIAGNOSIS — Z954 Presence of other heart-valve replacement: Secondary | ICD-10-CM

## 2016-02-07 DIAGNOSIS — Z5181 Encounter for therapeutic drug level monitoring: Secondary | ICD-10-CM

## 2016-02-07 DIAGNOSIS — Z953 Presence of xenogenic heart valve: Secondary | ICD-10-CM

## 2016-02-07 LAB — ECHOCARDIOGRAM COMPLETE
HEIGHTINCHES: 74 in
Weight: 4156.8 oz

## 2016-02-07 LAB — POCT INR: INR: 3.4

## 2016-02-07 MED ORDER — POTASSIUM CHLORIDE CRYS ER 20 MEQ PO TBCR: 20.0000 meq | EXTENDED_RELEASE_TABLET | Freq: Every day | ORAL | 3 refills | Status: DC

## 2016-02-07 MED ORDER — FUROSEMIDE 40 MG PO TABS: 40.0000 mg | ORAL_TABLET | Freq: Every day | ORAL | 3 refills | Status: DC

## 2016-02-07 MED ORDER — PERFLUTREN LIPID MICROSPHERE
1.0000 mL | INTRAVENOUS | Status: AC | PRN
  Administered 2016-02-07: 3 mL via INTRAVENOUS

## 2016-02-07 NOTE — Patient Instructions (Addendum)
Medication Instructions:  Your physician has recommended you make the following change in your medication:  1. Take an additional 40mg  of Furosemide today, please increase to Furosemide 40mg  twice a day for 2 days and then decrease to 40mg  daily 2. START Potassium Chloride 17mEq take one tablet by mouth daily   Labwork: Your physician recommends that you have lab work at appointment next week with Richardson Dopp PA-C: BMP  Testing/Procedures: Your physician has requested that you have an echocardiogram. Echocardiography is a painless test that uses sound waves to create images of your heart. It provides your doctor with information about the size and shape of your heart and how well your heart's chambers and valves are working. This procedure takes approximately one hour. There are no restrictions for this procedure.  A chest x-ray takes a picture of the organs and structures inside the chest, including the heart, lungs, and blood vessels. This test can show several things, including, whether the heart is enlarges; whether fluid is building up in the lungs; and whether pacemaker / defibrillator leads are still in place. ([facility], 1st floor)  Follow-Up: Please keep your scheduled follow up appointment next week with Richardson Dopp PA-C.   Any Other Special Instructions Will Be Listed Below (If Applicable).     If you need a refill on your cardiac medications before your next appointment, please call your pharmacy.

## 2016-02-07 NOTE — Progress Notes (Signed)
Cardiology Office Note Date:  02/07/2016   ID:  Bill [name], DOB 10/29/1940, MRN [id number]  PCP:  Bill Bush, MD  Cardiologist:  Bill Mocha, MD    Chief Complaint  Patient presents with  . Shortness of Breath     History of Present Illness: Bill [name] is a 75 y.o. male who presents for an add-on visit after recent hospitalization For aortic valve replacement, Maze procedure, and PFO closure. The patient underwent cardiac surgery by Dr. Roxy [name] 01/27/2016. He underwent aortic valve replacement with a 27 mm Magna Ease pericardial tissue valve, maze procedure with clipping of the left atrial appendage, and closure of a patent foramen ovale. Chronic medical problems include persistent atrial fibrillation, chronic diastolic heart failure, hypertension, stage III chronic kidney disease, and type 2 diabetes. He was discharged September 13 after a 6 day hospitalization. His postoperative course was notable for periods of atrial fibrillation and requirement of a pack red blood cell transfusion. Overall he did quite well. Tikosyn was restarted postoperatively. He was discharged on a combination of aspirin and warfarin.  States that since discharge from the hospital he has been short of breath. Feels like he needs to take deep breaths most of the time. He is here with his wife today and she feels his breathing is worse than it was prior to surgery. He is only able to sleep in a recliner. He's not sleeping well. He denies edema and feels like he's lost all of the fluid gained at the time of surgery. He denies lightheadedness or syncope. He's using the incentive spirometer and doing exercises as instructed.   Past Medical History:  Diagnosis Date  . Aortic stenosis 12/2010  . Atrial fibrillation ([name])   . CHF (congestive heart failure) ([name])   . Constipation   . Coronary artery disease   . Exogenous obesity   . GERD (gastroesophageal reflux disease)    occasional  . Heart murmur      as a child  . History of chicken pox   . Hypertension   . Kidney stones remote  . Lyme disease 2012   ?titers negative  . Neuromuscular disorder ([name])    tremor right arm  . S/P aortic valve replacement with bioprosthetic valve 01/27/2016   27 mm [facility] Ease bovine pericardial tissue valve  . S/P Maze operation for atrial fibrillation 01/27/2016   Complete bilateral atrial lesion set using bipolar radiofrequency and cryothermy ablation with clipping of LA appendage  . Shortness of breath dyspnea   . Type 2 diabetes, controlled, with retinopathy (Bill [name]) 1980s  . Urge incontinence   . Vitamin B12 deficiency    IF normal (2014)    Past Surgical History:  Procedure Laterality Date  . AORTIC VALVE REPLACEMENT N/A 01/27/2016   Procedure: AORTIC VALVE REPLACEMENT (AVR);  Surgeon: Bill Alberts, MD;  Location: [name];  Service: Open Heart Surgery;  Laterality: N/A;  . BACK SURGERY  20 yrs ago   removal of lower back cartilage due to damage. Patient does not remember date of surgery.  Marland Kitchen CARDIAC CATHETERIZATION N/A 01/05/2016   Procedure: Right/Left Heart Cath and Coronary Angiography;  Surgeon: Bill Blanks, MD;  Location: [name];  Service: Cardiovascular;  Laterality: N/A;  . CARDIOVERSION N/A 02/25/2015   Procedure: CARDIOVERSION;  Surgeon: Bill Latch, MD;  Location: [name];  Service: Cardiovascular;  Laterality: N/A;  . CATARACT EXTRACTION Bilateral 2015   [name]  . COLONOSCOPY    .  EYE SURGERY     left eye "cleaned"  . finger sx    . kidney stone removal    . MAZE N/A 01/27/2016   Procedure: MAZE;  Surgeon: Bill Alberts, MD;  Location: [name];  Service: Open Heart Surgery;  Laterality: N/A;  . TEE WITHOUT CARDIOVERSION N/A 11/05/2015   Procedure: TRANSESOPHAGEAL ECHOCARDIOGRAM (TEE);  Surgeon: Bill Casino, MD;  Location: [name];  Service: Cardiovascular;  Laterality: N/A;  . TEE WITHOUT CARDIOVERSION N/A 01/27/2016    Procedure: TRANSESOPHAGEAL ECHOCARDIOGRAM (TEE);  Surgeon: Bill Alberts, MD;  Location: [name];  Service: Open Heart Surgery;  Laterality: N/A;  . TOTAL HIP ARTHROPLASTY  03/12/2012   RIGHT TOTAL HIP ARTHROPLASTY ANTERIOR APPROACH;  Surgeon: Bill Rossetti, MD;  . US ECHOCARDIOGRAPHY  12/2010   EF 50-55%, mild LVH, normal wall motion, high LV filling pressures, mild AS, LA mildly dilated    Current Outpatient Prescriptions  Medication Sig Dispense Refill  . aspirin EC 81 MG EC tablet Take 1 tablet (81 mg total) by mouth daily.    . cholecalciferol (VITAMIN D) 1000 UNITS tablet Take 1,000 Units by mouth every morning.     . dofetilide (TIKOSYN) 250 MCG capsule Take 1 capsule (250 mcg total) by mouth 2 (two) times daily. 60 capsule 11  . furosemide (LASIX) 20 MG tablet Take 20 mg by mouth daily. Per Patient, Dr. Lovena [name] stated he could take 40 mg per day by mouth, but patient doesn't always take 40mg  by mouth daily    . glipiZIDE (GLUCOTROL) 10 MG tablet Take 5-10 mg by mouth 2 (two) times daily before a meal. Take 10 mg by mouth daily with breakfast, 5 mg daily with dinner    . glucose blood (ONE TOUCH ULTRA TEST) test strip Use to check sugar once daily and as needed. Dx:E11.319 **One Touch Ultra** 100 each 2  . Magnesium Oxide 250 MG TABS Take 1 tablet by mouth daily as needed (supplement).     . metFORMIN (GLUCOPHAGE) 500 MG tablet Take 500 mg by mouth 2 (two) times daily with a meal.     . Multiple Vitamin (MULTIVITAMIN WITH MINERALS) TABS tablet Take by mouth daily. 1 packet of 5 vitamins daily    . multivitamin-lutein (OCUVITE-LUTEIN) CAPS capsule Take 3 capsules by mouth daily.    Marland Kitchen oxybutynin (DITROPAN) 5 MG tablet Take 1 tablet (5 mg total) by mouth 3 (three) times daily. 270 tablet 3  . oxyCODONE (OXY IR/ROXICODONE) 5 MG immediate release tablet Take 1 tablet (5 mg total) by mouth every 4 (four) hours as needed for severe pain. 28 tablet 0  . vitamin B-12 (CYANOCOBALAMIN) 1000  MCG tablet Take 1,000 mcg by mouth daily.    Marland Kitchen warfarin (COUMADIN) 2.5 MG tablet Take 1 tablet (2.5 mg total) by mouth daily at 6 PM. Or as directed. 30 tablet 1   No current facility-administered medications for this visit.     Allergies:   No known allergies   Social History:  The patient  reports that he has never smoked. He has never used smokeless tobacco. He reports that he drinks alcohol. He reports that he does not use drugs.   Family History:  The patient's family history includes Cancer in his brother; Diabetes in his father and mother; Heart disease in his father and mother.    ROS:  Please see the history of present illness.  Otherwise, review of systems is positive for fatigue, insomnia.  All other systems are  reviewed and negative.    PHYSICAL EXAM: VS:  BP 124/70   Pulse 94   Ht 6\' 2"  (1.88 m)   Wt 117.8 kg (259 lb 12.8 oz)   SpO2 99%   BMI 33.36 kg/m  , BMI Body mass index is 33.36 kg/m. GEN: elderly male, in no acute distress  HEENT: normal  Neck: no JVD, no masses. No carotid bruits Cardiac: irregularly irregular without murmur or gallop       Respiratory:  Rales both bases Chest: sternal scar healing well GI: soft, nontender, nondistended, + BS MS: no deformity or atrophy  Ext: no pretibial edema, pedal pulses 2+= bilaterally Skin: warm and dry, no rash Neuro:  Strength and sensation are intact Psych: euthymic mood, full affect  EKG:  EKG is ordered today. The ekg ordered today shows atrial fibrillation, low voltage QRS, HR 81 bpm, age indeterminate inferior infarct  Recent Labs: 01/25/2016: ALT 21 01/31/2016: Hemoglobin 9.5; Magnesium 2.1; Platelets 134 02/02/2016: BUN 32; Creatinine, Ser 1.72; Potassium 4.2; Sodium 137   Lipid Panel     Component Value Date/Time   CHOL 170 09/08/2015 0821   TRIG 63.0 09/08/2015 0821   TRIG 71 04/17/2006 1259   HDL 61.50 09/08/2015 0821   CHOLHDL 3 09/08/2015 0821   VLDL 12.6 09/08/2015 0821   LDLCALC 96  09/08/2015 0821   LDLDIRECT 124.4 12/31/2012 0739      Wt Readings from Last 3 Encounters:  02/07/16 117.8 kg (259 lb 12.8 oz)  02/02/16 118.3 kg (260 lb 14.4 oz)  01/25/16 117.9 kg (260 lb)     Cardiac Studies Reviewed: Chest x-ray 01/31/2016: IMPRESSION: 1. Small residual bilateral pleural effusions with associated passive atelectasis. Mild enlargement of the cardiopericardial silhouette, without edema. 2. Aortic valve prosthesis and atrial clip noted. 3. Atherosclerotic aortic arch.  ASSESSMENT AND PLAN: 1.  Acute on chronic diastolic heart failure 2. Aortic valve disease s/p bioprosthetic AVR 3. Atrial fibrillation, persistent, s/p MAZE 4. Pleural effusions, small 5. CKD Stage 3 secondary to diabetes  Pt worked in for an acute illness visit after complaining of progressive shortness of breath and orthopnea when he came in to the [facility]. He is here with his wife, known to me from preoperative assessment. Exam suggests volume overload and lasix will be increased to 40 mg BID x 2 days then 40 mg daily. He is in AF, rate-controlled, but understands he is in the early period after Maze and this is very common. Will check an echo to rule out effusion since shows low voltage. Will check a CXR to evaluate for pleural effusion. Follow-up next week as planned with Richardson Dopp. Check metabolic panel prior to FU with Scott.  Current medicines are reviewed with the patient today.  The patient does not have concerns regarding medicines.  Labs/ tests ordered today include:  No orders of the defined types were placed in this encounter.   Disposition:   FU as planned   ADDENDUM: Echo and CXR reviewed. Normal valve function, normal LV function, and trivial effusion seen. CXR with small bilateral effusions. Plan as above with increased diuretics and FU next week.  Deatra James, MD  02/07/2016 11:46 AM    [address],  [place], [place]  [address] Phone: [phone]; Fax: [phone]

## 2016-02-10 ENCOUNTER — Encounter: Payer: Self-pay | Admitting: *Deleted

## 2016-02-10 ENCOUNTER — Other Ambulatory Visit: Payer: Self-pay | Admitting: *Deleted

## 2016-02-10 NOTE — Patient Outreach (Addendum)
[facility]) [place] Telephone Outreach, Transition of Care, day 8 02/10/2016  Bill [name] 1941-02-21 [id number]  Successful telephone outreach to Bill [name], 75 y/o male referred to [facility] for transition of care after recent hospitalization September 7-13, 2017 for symptomatic Aortic stenosis and persistent Atrial fibrillation.  Patient underwent surgical AVR on January 27, 2016 and was discharged home to self care.  Patient has co-existing diagnosis of CHF, DM, HTN, CKD, hyperlipemia, and obesity.  HIPAA/ identity verified with patient.  Today, patient reports that he "is doing okay," but states that he has "gotten back to normal" with his breathing.  However, patient also reported that he has been unable to lie down in bed to sleep because "it is too hard to breathe," stating that he has been sleeping in his recliner.  Patient reports that he made his cardiology provider aware of this during scheduled office visit on Monday 02/07/16, at which time his lasix dosing was increased.  Patient stated that this increase has helped "some," but reports that he is still sleeping in his recliner, "because I just sleep better there."  Patient states that he will keep his cardiology provider aware of this symptom he is experiencing, and we discussed symptoms that would necessitate him contacting his providers/ EMS.  Patient reports that he has attended all scheduled provider appointments, and denies transportation needs/ issues, stating that his wife is taking him to and from provider appointments.  Patient states that he is monitoring and recording his daily weights, and reports a weight today of 250.5 lbs.  Patient reports that he has all of his medications and is taking as they are prescribed, but states that he is unable to go over his medications today due to time limitations.  During our conversation today, patient verbalized that he is not sure he  "needs any nurse" coming to his home to check on him.  [place] services were thoroughly discussed with him, with differences between [facility] and [facility] explained.  Patient agreed to [place] services, and in-home visit was scheduled today.  Patient denies further needs, questions, concerns, issues or problems, and confirmed that he has the phone numbers for his providers, [facility] CM main number, my direct phone number, and the 24-hour [facility] nurse advice line phone numbers.    Plan:   Patient will take his medications as they are prescribed and will attend all scheduled provider appointments.  Patient will continue to monitor and record his daily weights.  Patient will promptly notify his providers for any new concerns, issues, or problems that arise.  [place] involvement to continue with telephone outreach scheduled for next week and initial in-home visit scheduled for next month.  Oneta Rack, RN, BSN, [employer] [facility] Care Management  [phone]

## 2016-02-15 NOTE — Progress Notes (Signed)
Cardiology Office Note:    Date:  02/16/2016   ID:  Bill [name], DOB Oct 25, 1940, MRN [id number]  PCP:  [name], MD  Cardiologist:  Dr. [name]   Electrophysiologist:  Dr. Cristopher Peru   Referring MD: [name], MD   Chief Complaint  Patient presents with  . Hospitalization Follow-up    s/p AVR, MAZE, PFO closure    History of Present Illness:    Bill [name] is a 75 y.o. male with a hx of aortic stenosis, diastolic HF, persistent atrial fibrillation, HTN, CKD, diabetes. He has a hx of retinal bleeding and was felt to be a poor candidate for long term anticoagulation.  He was seen by Dr. [name] initially for possible Watchman LAA occlusion device.  However, he was noted to have evidence of worsening aortic stenosis.  TEE confirmed severe AS and he was ultimately referred to Dr. Roxy Manns for surgical replacement.    He underwent bioprosthetic AVR, Maze procedure and PFO closure by Dr. Roxy Manns 01/27/16. Postoperative course was notable for continued atrial fibrillation. He was in junctional rhythm and NSR at one point. He also required transfusion with PRBCs secondary to anemia.  He was previously on Eliquis but was placed Coumadin post op.  He was DC'd from the hospital 02/02/16. He was added on with Dr. [name] on 02/07/16 secondary to dyspnea.  He had been sleeping in a recliner.  He was felt to be volume overloaded.  Dr. [name] increased his Lasix. Rate was controlled in atrial fibrillation. Echocardiogram was obtained and demonstrated vigorous LV function, normally functioning AVR and trivial pericardial effusion.  Chest x-ray demonstrated small, slightly larger, bilateral pleural effusions.    He returns with his wife.  He is doing well. His breathing is improved and he feels "almost back to normal."  He denies orthopnea, PND.  He denies edema.  His chest remains sore. He denies fevers or cough.  He denies syncope or near syncope.  He has had some issues with epistaxis.      Prior CV studies that were reviewed today include:    Echo 02/07/16 Mod conc LVH, vigorous LVF, EF 65-70, no RWMA, AVR ok, mean 9 mmHg, MAC, trivial MR, mod LAE, trivial pericardial effusion  Carotid US 01/25/16 Bilateral 1-39% ICA stenosis  LHC 01/05/16 LAD prox 20, mid 30, dist 20 // LCx ok // RCA mid 30, dist 20, RPDA 20 1. Mild non-obstructive CAD 2. Hazy non-flow limiting mid LAD lesion which has the appearance of an eccentric calcified plaque, best seen in the cranial views. This is not seen in the caudal views. Cannot exclude eccentric thrombus but low suspicion for intra-coronary thrombus given the clinical scenario.  3. Severe aortic valve stenosis (peak to peak gradient visually is 40 mm Hg. The gradient reported does not account for catheter whip and variation seen in the waveforms during pullback. No AVA is reported due to this inaccurate data. I did not recross the aortic valve to remeasure).   Recommendations: Will continue planning for AVR, MAZE.   TEE 6/17 Severe aortic stenosis - AVA 0.93 cm2 with mean gradient of 43 mmHg.   Echo 10/21/15 Mod conc LVH, EF 55-60, no RWmA, mod AS, mean 69mHg, MAC, mild MR, mod RAE   Past Medical History:  Diagnosis Date  . Aortic stenosis 12/2010   s/p bioprosthetic AVR 9/17 // Echo 6/17: EF 55-60, mean AV 25 mmHg // TEE 6/17: severe AS, AVA 0.93, mean AV 43 mmHg // post op  Echo 02/07/16: mod conc LVH, EF 65-70, vigorous LVF, AVR ok, mean 9 mmHg, MAC, trivial MR, mod LAE, trivial pericardial eff  . Chronic diastolic CHF (congestive heart failure) ([place])   . Coronary artery disease    LHC 8/17: pLAD 20, mLAD 30, dLAD 20, mRCA 30, dRCA 20, RPDA 20  . Exogenous obesity   . GERD (gastroesophageal reflux disease)    occasional  . History of chicken pox   . Hypertension   . Kidney stones remote  . Lyme disease 2012   ?titers negative  . Neuromuscular disorder (HCC)    tremor right arm  . Persistent atrial fibrillation (HCC)    Tikosyn //  s/p Maze procedure 9/17 // Anticoagulation undesirable 2/2 hx of retinal hemorrhage (worked up for [employer] but referred for Maze at time of AVR 2/2 aortic stenosis)   . PFO (patent foramen ovale)    s/p PFO closure at time of AVR in 9/17  . S/P aortic valve replacement with bioprosthetic valve 01/27/2016   27 mm [facility] Ease bovine pericardial tissue valve  . S/P Maze operation for atrial fibrillation 01/27/2016   Complete bilateral atrial lesion set using bipolar radiofrequency and cryothermy ablation with clipping of LA appendage  . Type 2 diabetes, controlled, with retinopathy ([place]) 1980s  . Urge incontinence   . Vitamin B12 deficiency    IF normal (2014)    Past Surgical History:  Procedure Laterality Date  . AORTIC VALVE REPLACEMENT N/A 01/27/2016   Procedure: AORTIC VALVE REPLACEMENT (AVR);  Surgeon: Rexene Alberts, MD;  Location: [place];  Service: Open Heart Surgery;  Laterality: N/A;  . BACK SURGERY  20 yrs ago   removal of lower back cartilage due to damage. Patient does not remember date of surgery.  Marland Kitchen CARDIAC CATHETERIZATION N/A 01/05/2016   Procedure: Right/Left Heart Cath and Coronary Angiography;  Surgeon: Burnell Blanks, MD;  Location: [place] CV LAB;  Service: Cardiovascular;  Laterality: N/A;  . CARDIOVERSION N/A 02/25/2015   Procedure: CARDIOVERSION;  Surgeon: Skeet Latch, MD;  Location: [facility] ENDOSCOPY;  Service: Cardiovascular;  Laterality: N/A;  . CATARACT EXTRACTION Bilateral 2015   [place] [facility] [place]  . COLONOSCOPY    . EYE SURGERY     left eye "cleaned"  . finger sx    . kidney stone removal    . MAZE N/A 01/27/2016   Procedure: MAZE;  Surgeon: Rexene Alberts, MD;  Location: [place];  Service: Open Heart Surgery;  Laterality: N/A;  . TEE WITHOUT CARDIOVERSION N/A 11/05/2015   Procedure: TRANSESOPHAGEAL ECHOCARDIOGRAM (TEE);  Surgeon: Pixie Casino, MD;  Location: [place];  Service: Cardiovascular;  Laterality: N/A;  . TEE WITHOUT  CARDIOVERSION N/A 01/27/2016   Procedure: TRANSESOPHAGEAL ECHOCARDIOGRAM (TEE);  Surgeon: Rexene Alberts, MD;  Location: [place];  Service: Open Heart Surgery;  Laterality: N/A;  . TOTAL HIP ARTHROPLASTY  03/12/2012   RIGHT TOTAL HIP ARTHROPLASTY ANTERIOR APPROACH;  Surgeon: Mcarthur Rossetti, MD;  . US ECHOCARDIOGRAPHY  12/2010   EF 50-55%, mild LVH, normal wall motion, high LV filling pressures, mild AS, LA mildly dilated    Current Medications: Outpatient Medications Prior to Visit  Medication Sig Dispense Refill  . aspirin EC 81 MG EC tablet Take 1 tablet (81 mg total) by mouth daily.    . cholecalciferol (VITAMIN D) 1000 UNITS tablet Take 1,000 Units by mouth every morning.     . dofetilide (TIKOSYN) 250 MCG capsule Take 1 capsule (250 mcg total) by  mouth 2 (two) times daily. 60 capsule 11  . furosemide (LASIX) 40 MG tablet Take 1 tablet (40 mg total) by mouth daily. 30 tablet 3  . glipiZIDE (GLUCOTROL) 10 MG tablet Take 5-10 mg by mouth 2 (two) times daily before a meal. Take 10 mg by mouth daily with breakfast, 5 mg daily with dinner    . glucose blood (ONE TOUCH ULTRA TEST) test strip Use to check sugar once daily and as needed. Dx:E11.319 **One Touch Ultra** 100 each 2  . Magnesium Oxide 250 MG TABS Take 1 tablet by mouth daily as needed (supplement).     . metFORMIN (GLUCOPHAGE) 500 MG tablet Take 500 mg by mouth 2 (two) times daily with a meal.     . Multiple Vitamin (MULTIVITAMIN WITH MINERALS) TABS tablet Take by mouth daily. 1 packet of 5 vitamins daily    . multivitamin-lutein (OCUVITE-LUTEIN) CAPS capsule Take 3 capsules by mouth daily.    Marland Kitchen oxybutynin (DITROPAN) 5 MG tablet Take 1 tablet (5 mg total) by mouth 3 (three) times daily. 270 tablet 3  . potassium chloride SA (K-DUR,KLOR-CON) 20 MEQ tablet Take 1 tablet (20 mEq total) by mouth daily. 30 tablet 3  . vitamin B-12 (CYANOCOBALAMIN) 1000 MCG tablet Take 1,000 mcg by mouth daily.    Marland Kitchen warfarin (COUMADIN) 2.5 MG tablet  Take 1 tablet (2.5 mg total) by mouth daily at 6 PM. Or as directed. 30 tablet 1  . oxyCODONE (OXY IR/ROXICODONE) 5 MG immediate release tablet Take 1 tablet (5 mg total) by mouth every 4 (four) hours as needed for severe pain. (Patient not taking: Reported on 02/16/2016) 28 tablet 0   No facility-administered medications prior to visit.       Allergies:   No known allergies   Social History   Social History  . Marital status: Married    Spouse name: N/A  . Number of children: N/A  . Years of education: N/A   Social History Main Topics  . Smoking status: Never Smoker  . Smokeless tobacco: Never Used  . Alcohol use Yes     Comment: Regular (bourbon and coke 2-3/day)  . Drug use: No  . Sexual activity: No   Other Topics Concern  . None   Social History Narrative   Caffeine: coffee 1 cup/day   Lives with wife [name]), 1 cat   Occupation: retired, worked for [employer]   Edu: BS   Activity: no regular exercise   Diet: some water, fruits/vegetables daily      Advanced directives: has living will at home. Does not want prolonged life support "no extraordinary measures" but would accept temporary measures. Would want wife to be HCPOA.      Family History:  The patient's family history includes Cancer in his brother; Diabetes in his father and mother; Heart disease in his father and mother.   ROS:   Please see the history of present illness.    ROS All other systems reviewed and are negative.   EKGs/Labs/Other Test Reviewed:    EKG:  EKG is  ordered today.  The ekg ordered today demonstrates Junctional rhythm vs NSR with long 1st degree AVB (PR ~ 400 ms), QTc 496 ms  Recent Labs: 01/25/2016: ALT 21 01/31/2016: Hemoglobin 9.5; Magnesium 2.1; Platelets 134 02/02/2016: BUN 32; Creatinine, Ser 1.72; Potassium 4.2; Sodium 137   Recent Lipid Panel    Component Value Date/Time   CHOL 170 09/08/2015 0821   TRIG 63.0 09/08/2015 0821   TRIG  71 04/17/2006 1259   HDL 61.50 09/08/2015  0821   CHOLHDL 3 09/08/2015 0821   VLDL 12.6 09/08/2015 0821   LDLCALC 96 09/08/2015 0821   LDLDIRECT 124.4 12/31/2012 0739    Physical Exam:    VS:  BP 120/70   Pulse 82   Ht 6\' 2"  (1.88 m)   Wt 237 lb 12.8 oz (107.9 kg)   SpO2 95%   BMI 30.53 kg/m     Wt Readings from Last 3 Encounters:  02/16/16 237 lb 12.8 oz (107.9 kg)  02/07/16 259 lb 12.8 oz (117.8 kg)  02/02/16 260 lb 14.4 oz (118.3 kg)     Physical Exam  Constitutional: He is oriented to person, place, and time. He appears well-developed and well-nourished. No distress.  HENT:  Head: Normocephalic and atraumatic.  Eyes: No scleral icterus.  Neck: No JVD present.  Cardiovascular: Normal rate, regular rhythm, S1 normal and S2 normal.   No murmur heard. Pulmonary/Chest: He has decreased breath sounds. He has no wheezes. He has no rhonchi. He has no rales.  Abdominal: Soft. There is no tenderness.  Musculoskeletal: He exhibits no edema.  Neurological: He is alert and oriented to person, place, and time.  Skin: Skin is warm and dry.  Psychiatric: He has a normal mood and affect.    ASSESSMENT:    1. S/P aortic valve replacement with bioprosthetic valve + maze procedure   2. Persistent atrial fibrillation ([place])   3. Chronic diastolic heart failure ([place])   4. Essential hypertension   5. Coronary artery disease involving native coronary artery of native heart without angina pectoris   6. CKD stage 3 due to type 2 diabetes mellitus (HCC)   7. Junctional rhythm   8. Epistaxis    PLAN:    In order of problems listed above:  1. S/p AVR - He is recovering nicely from his recent AVR, Maze and PFO closure.  He did have some issues with volume excess after DC but this is improved with increasing his Furosemide. He had a FU with with normally functioning bioprosthetic AVR.  He did not have a significant pericardial effusion.  He sees Dr. Roxy Manns in a few weeks.  He is interested in [facility].    -  Refer to [facility].  -  Continue  SBE prophylaxis..  2. Persistent AFib - s/p Maze. Rhythm today looks junctional vs NSR with long 1st degree AVB.  He remains on Coumadin, ASA 81.  Continue Tikosyn.  BMET, Mg2+ today given recent increase in Furosemide.    3. Chronic diastolic CHF - Volume improved.  Weight is down considerably by our scales.  He feels much better.  Get FU BMET, Mg2+ today.   4. HTN - BP is controlled.   5. CAD - Non-obstructive by LHC prior to AVR.  I am not sure why he is not on a statin.  Will see if he is willing to take Pravastatin.    6. CKD - Recent SCr 1.72.  Repeat BMET today given increase in Furosemide.    7. Junctional Rhythm - I reviewed his ECG today with Dr. Lovena Le.  His rhythm appears to be junctional rhythm with normal HR vs NSR with long 1st degree AVB.  He is not symptomatic.  Will get a 48 Hr Holter to assess his rhythm further and FU with Dr. Cristopher Peru in 3-4 weeks.   8. Epistaxis - He has had a few episodes of this.  I have asked him to  let us know if this continues.  Will need to refer to ENT to get area cauterized to help prevent recurrent epistaxis.     Medication Adjustments/Labs and Tests Ordered: Current medicines are reviewed at length with the patient today.  Concerns regarding medicines are outlined above.  Medication changes, Labs and Tests ordered today are outlined in the Patient Instructions noted below. Patient Instructions  Medication Instructions:  Your physician recommends that you continue on your current medications as directed. Please refer to the Current Medication list given to you today.  Labwork: TODAY BMET, MAGNESIUM LEVEL  Testing/Procedures: 1. Your physician has recommended that you wear a 48 HOUR holter monitor. Holter monitors are medical devices that record the heart's electrical activity. Doctors most often use these monitors to diagnose arrhythmias. Arrhythmias are problems with the speed or rhythm of the heartbeat. The monitor is a small, portable  device. You can wear one while you do your normal daily activities. This is usually used to diagnose what is causing palpitations/syncope (passing out).  Follow-Up: 1. DR. Lovena Le IN THE NEXT 3-4 WEEKS PER Bunnie Lederman, PAC  2. DR. [name] IN 2 MONTHS   Any Other Special Instructions Will Be Listed Below (If Applicable). I WILL HAVE THE CHECK OUT PLEASE CHECK ON THE REFERRAL TO CARDIAC REHAB FOR YOU; THE REFERRAL IS IN THE SYSTEM; CARDIAC REHAB PHONE # IS [phone]  CALL IF YOU HAVE ANOTHER NOSE BLEED [phone]  If you need a refill on your cardiac medications before your next appointment, please call your pharmacy.  Signed, Richardson Dopp, PA-C  02/16/2016 12:17 PM    [facility] [address], [place], [place]  [address] Phone: [phone]; Fax: [phone]

## 2016-02-16 ENCOUNTER — Other Ambulatory Visit: Payer: PPO | Admitting: *Deleted

## 2016-02-16 ENCOUNTER — Other Ambulatory Visit: Payer: Self-pay | Admitting: Internal Medicine

## 2016-02-16 ENCOUNTER — Telehealth: Payer: Self-pay | Admitting: *Deleted

## 2016-02-16 ENCOUNTER — Ambulatory Visit (INDEPENDENT_AMBULATORY_CARE_PROVIDER_SITE_OTHER): Payer: PPO | Admitting: Physician Assistant

## 2016-02-16 ENCOUNTER — Encounter: Payer: Self-pay | Admitting: *Deleted

## 2016-02-16 ENCOUNTER — Other Ambulatory Visit: Payer: Self-pay | Admitting: *Deleted

## 2016-02-16 ENCOUNTER — Encounter: Payer: Self-pay | Admitting: Physician Assistant

## 2016-02-16 ENCOUNTER — Ambulatory Visit (INDEPENDENT_AMBULATORY_CARE_PROVIDER_SITE_OTHER): Payer: PPO | Admitting: Pharmacist

## 2016-02-16 VITALS — BP 120/70 | HR 82 | Ht 74.0 in | Wt 237.8 lb

## 2016-02-16 DIAGNOSIS — N183 Chronic kidney disease, stage 3 unspecified: Secondary | ICD-10-CM

## 2016-02-16 DIAGNOSIS — I10 Essential (primary) hypertension: Secondary | ICD-10-CM

## 2016-02-16 DIAGNOSIS — I481 Persistent atrial fibrillation: Secondary | ICD-10-CM | POA: Diagnosis not present

## 2016-02-16 DIAGNOSIS — Z954 Presence of other heart-valve replacement: Secondary | ICD-10-CM

## 2016-02-16 DIAGNOSIS — I2510 Atherosclerotic heart disease of native coronary artery without angina pectoris: Secondary | ICD-10-CM

## 2016-02-16 DIAGNOSIS — Z953 Presence of xenogenic heart valve: Secondary | ICD-10-CM

## 2016-02-16 DIAGNOSIS — R040 Epistaxis: Secondary | ICD-10-CM

## 2016-02-16 DIAGNOSIS — E1122 Type 2 diabetes mellitus with diabetic chronic kidney disease: Secondary | ICD-10-CM | POA: Diagnosis not present

## 2016-02-16 DIAGNOSIS — Z9889 Other specified postprocedural states: Secondary | ICD-10-CM

## 2016-02-16 DIAGNOSIS — Z8679 Personal history of other diseases of the circulatory system: Secondary | ICD-10-CM | POA: Diagnosis not present

## 2016-02-16 DIAGNOSIS — I5032 Chronic diastolic (congestive) heart failure: Secondary | ICD-10-CM

## 2016-02-16 DIAGNOSIS — E785 Hyperlipidemia, unspecified: Secondary | ICD-10-CM

## 2016-02-16 DIAGNOSIS — Z5181 Encounter for therapeutic drug level monitoring: Secondary | ICD-10-CM

## 2016-02-16 DIAGNOSIS — I4819 Other persistent atrial fibrillation: Secondary | ICD-10-CM

## 2016-02-16 DIAGNOSIS — I482 Chronic atrial fibrillation, unspecified: Secondary | ICD-10-CM

## 2016-02-16 DIAGNOSIS — I498 Other specified cardiac arrhythmias: Secondary | ICD-10-CM

## 2016-02-16 LAB — BASIC METABOLIC PANEL
BUN: 19 mg/dL (ref 7–25)
CO2: 23 mmol/L (ref 20–31)
Calcium: 10.3 mg/dL (ref 8.6–10.3)
Chloride: 98 mmol/L (ref 98–110)
Creat: 1.62 mg/dL — ABNORMAL HIGH (ref 0.70–1.18)
Glucose, Bld: 92 mg/dL (ref 65–99)
Potassium: 4.5 mmol/L (ref 3.5–5.3)
Sodium: 136 mmol/L (ref 135–146)

## 2016-02-16 LAB — MAGNESIUM: Magnesium: 1.9 mg/dL (ref 1.5–2.5)

## 2016-02-16 LAB — POCT INR: INR: 2.9

## 2016-02-16 MED ORDER — PRAVASTATIN SODIUM 20 MG PO TABS: 20.0000 mg | ORAL_TABLET | Freq: Every evening | ORAL | 3 refills | Status: DC

## 2016-02-16 MED ORDER — DOFETILIDE 250 MCG PO CAPS: 250.0000 ug | ORAL_CAPSULE | Freq: Two times a day (BID) | ORAL | 8 refills | Status: DC

## 2016-02-16 NOTE — Patient Instructions (Addendum)
Medication Instructions:  Your physician recommends that you continue on your current medications as directed. Please refer to the Current Medication list given to you today.  ADDENDUM : 02/16/16 @ 2:08 PM; PER SCOTT WEAVER, PAC TO HAVE PT START PRAVASTATIN 20 MG DAILY; RX HAS BEEN SENT IN   Labwork: TODAY BMET, MAGNESIUM LEVEL  03/31/16 YOU WILL NEED FASTING CHOLESTEROL PANEL; COME IN SOMETIME FIRST THING ON 03/31/16 LAB OPENS AT 7:30 AM   Testing/Procedures: 1. Your physician has recommended that you wear a 48 HOUR holter monitor. Holter monitors are medical devices that record the heart's electrical activity. Doctors most often use these monitors to diagnose arrhythmias. Arrhythmias are problems with the speed or rhythm of the heartbeat. The monitor is a small, portable device. You can wear one while you do your normal daily activities. This is usually used to diagnose what is causing palpitations/syncope (passing out).  Follow-Up: 1. DR. Lovena Le IN THE NEXT 3-4 WEEKS PER SCOTT WEAVER, PAC  2. DR. [name] IN 2 MONTHS   Any Other Special Instructions Will Be Listed Below (If Applicable). I WILL HAVE THE CHECK OUT PLEASE CHECK ON THE REFERRAL TO CARDIAC REHAB FOR YOU; THE REFERRAL IS IN THE SYSTEM; CARDIAC REHAB PHONE # IS [phone]  CALL IF YOU HAVE ANOTHER NOSE BLEED [phone]  If you need a refill on your cardiac medications before your next appointment, please call your pharmacy.

## 2016-02-16 NOTE — Addendum Note (Signed)
Addended by: Michae Kava on: 02/16/2016 02:15 PM   Modules accepted: Orders

## 2016-02-16 NOTE — Telephone Encounter (Signed)
Pt has been notified of lab results by phone with verbal understanding. 

## 2016-02-16 NOTE — Patient Outreach (Signed)
[facility]) Care Management [place] Telephone Outreach, Transition of care day 14 02/16/2016  Bill [name] 11/01/1940 [id number]  Successful telephone outreach to Bill [name], 75 y/o male referred to [facility] for transition of care after recent hospitalization September 7-13, 2017 for symptomatic Aortic stenosis and persistent Atrial fibrillation.  Patient underwent surgical AVR on January 27, 2016 and was discharged home to self care.  Patient has co-existing diagnosis of CHF, DM, HTN, CKD, hyperlipemia, and obesity.  HIPAA/ identity verified with patient.  Today, patient reports that he "is doing much better," stating that he has attended all provider appointments, where his Lasix was increased during one visit, which "resulted in my breathing much better."  Patient states that he is now "able to sleep in the bed instead of the recliner."    Patient continues to report that he is monitoring and recording his daily weights, but is unable to verify his weight today, stating that he cannot remember exactly how much he weighed this morning. Patient again reports that he has all of his medications and is taking as they are prescribed.  We briefly discussed rationale for daily weights, as well as CHF zones; patient denied need for me to provide him with educational materials for self-health management of CHF and/or Atrial Fibrillation.  Patient denies further needs, questions, concerns, issues or problems, and we confirmed our previously scheduled [place] in-home visit for next week.  Plan:   Patient will take his medications as they are prescribed and will attend all scheduled provider appointments.  Patient will continue to monitor and record his daily weights.  Patient will promptly notify his providers for any new concerns, issues, or problems that arise.  [place] outreach to continue with scheduled in-home visit next  week.  Oneta Rack, RN, BSN, [employer] [facility] Care Management  [phone]

## 2016-02-16 NOTE — Telephone Encounter (Signed)
I called pt per Brynda Rim. PA to see if he would be willing to start on a statin due to [facility] did show some mild plaque. I explained to pt that the PA feels that he would benefit from starting a statin to help reduce or at least slow down further plaque build up. Pt is agreeable to this plan. Pt has been advised per Brynda Rim. PA to start Pravastatin 20 mg daily; Rx has been sent in ; LFT/FLP 03/31/16 Pt is agreeable to plan of care.     ADDENDUM MADE TO OV NOTE TODAY AND TO AVS: 02/16/16 @ 2:08 PM; PER SCOTT WEAVER, PAC TO HAVE PT START PRAVASTATIN 20 MG DAILY; RX HAS BEEN SENT IN.  I SENT OUT A NEW AVS TO THE PT TODAY AS WELL SHOWING THE CHANGES.

## 2016-02-18 ENCOUNTER — Encounter: Payer: Self-pay | Admitting: Thoracic Surgery (Cardiothoracic Vascular Surgery)

## 2016-02-20 ENCOUNTER — Encounter: Payer: Self-pay | Admitting: Family Medicine

## 2016-02-21 ENCOUNTER — Other Ambulatory Visit: Payer: Self-pay | Admitting: *Deleted

## 2016-02-21 ENCOUNTER — Encounter: Payer: Self-pay | Admitting: *Deleted

## 2016-02-21 MED ORDER — METFORMIN HCL 500 MG PO TABS: 500.0000 mg | ORAL_TABLET | Freq: Two times a day (BID) | ORAL | 3 refills | Status: DC

## 2016-02-21 NOTE — Patient Outreach (Signed)
[facility]) Care Management  [place] Initial Home Visit, Transition of Care day 19 02/21/2016  Bill [name] [birth year] [id number]  Bill [name] is an 75 y.o. male referred to [facility] for transition of care after recent hospitalization September 7-13, 2017 for symptomatic Aortic stenosis and persistent Atrial fibrillation. Patient underwent surgical AVR on January 27, 2016 and was discharged home to self care. Patient has co-existing diagnosis of CHF, DM, HTN, CKD, hyperlipemia, and obesity. Patient's wife is present in the home for today's home visit.  Today, Bill [name] reports that he continues to be "much better," after his recent hospital visits. Bill [name] continues to report that he is able to sleep in his bed again now that his Lasix dosing has been increased.  Bill [name] reports that he has all of his medications, and he verbalizes a good gereral understanding of the purpose, dosing, and scheduling of his medications. Medication reconciliation was performed during today's home visit; patient was recently discharged from hospital and all medications were reviewed with him thoroughly today.  Bill [name] reports that he has attended all scheduled provider appointments, and verbalizes an accurate report of upcoming provider appointments, stating that his wife will transport him to these appointments until he is released to drive by his surgeon.  Bill [name] denies community resource needs and reports a good support system through his family/ wife, who actively participate in his care and assist with any of his healthcare needs. No safety concerns were identified during today's home visit.  Bill [name] continues to report that he is monitoring his daily weights, but reports this morning that he has not continued recording his weight values, stating, "now that they have got my lasix straightened out, my weight hasn't changed."  Bill [name] reports a weight this  morning of 236 lbs.  We discussed the value of recording his weights over time, but patient insists that he does not need to record them, as he voices that he has a good understanding of signs/symptoms of fluid overload, and follows general action plan for CHF zones, which we discussed thoroughly during our visit today.  We also discussed general self-health management of Atrial Fibrillation.  Patient verbalizes a good understanding of self-health management of both CHF and Atrial Fibrillation today.  Patient also reports that he monitors his blood sugar levels every morning, and reports a blood sugar this morning of 150.  Bill [name] reports that he believes his blood sugar "is under control," and denies questions or concerns around self-health management of DM today.  Bill [name] denies further needs, questions, concerns, issues or problems today.  Subjective: "I am doing fine now that my Lasix has been increased.  I believe I have everything with my health under control."  Objective:    BP (!) 148/68   Pulse 74   Resp 16   Wt 236 lb (107 kg)   SpO2 91%   BMI 30.30 kg/m    Review of Systems  Constitutional: Positive for weight loss. Negative for malaise/fatigue.       Patient reports that he has lost 20+ pounds since his recent hospital visits  HENT: Positive for nosebleeds.        Patient reports occasional nosebleeds  Respiratory: Negative.  Negative for cough, sputum production, shortness of breath and wheezing.   Cardiovascular: Negative.  Negative for chest pain, palpitations and leg swelling.       Patient reports intermittent orthopnea after recent hospitalizations; now  resolved with increase Lasix dosing  Gastrointestinal: Negative.  Negative for abdominal pain and nausea.  Genitourinary: Negative.   Musculoskeletal: Negative.  Negative for falls and myalgias.  Neurological: Negative.  Negative for weakness.  Psychiatric/Behavioral: Negative.  Negative for depression. The  patient is not nervous/anxious.     Physical Exam  Constitutional: He is oriented to person, place, and time. He appears well-developed and well-nourished. No distress.  Cardiovascular: Normal rate and intact distal pulses.  An irregular rhythm present.  Pulses:      Radial pulses are 2+ on the right side, and 2+ on the left side.       Dorsalis pedis pulses are 1+ on the right side, and 1+ on the left side.  Slightly irregular heart sounds  Respiratory: Effort normal and breath sounds normal. No respiratory distress. He has no wheezes. He has no rales.  GI: Soft. Bowel sounds are normal.  Musculoskeletal: He exhibits no edema.  Neurological: He is alert and oriented to person, place, and time.  Skin: Skin is warm and dry.  Psychiatric: He has a normal mood and affect. His behavior is normal. Judgment and thought content normal.    Encounter Medications:   Outpatient Encounter Prescriptions as of 02/21/2016  Medication Sig  . acetaminophen (TYLENOL) 325 MG tablet Take 650 mg by mouth every 4 (four) hours as needed for mild pain or fever.  Marland Kitchen aspirin EC 81 MG EC tablet Take 1 tablet (81 mg total) by mouth daily.  . cholecalciferol (VITAMIN D) 1000 UNITS tablet Take 1,000 Units by mouth every morning.   . dofetilide (TIKOSYN) 250 MCG capsule Take 1 capsule (250 mcg total) by mouth 2 (two) times daily.  . furosemide (LASIX) 40 MG tablet Take 1 tablet (40 mg total) by mouth daily.  Marland Kitchen glipiZIDE (GLUCOTROL) 10 MG tablet Take 5-10 mg by mouth 2 (two) times daily before a meal. Take 10 mg by mouth daily with breakfast, 5 mg daily with dinner  . Magnesium Oxide 250 MG TABS Take 1 tablet by mouth daily as needed (supplement).   . metFORMIN (GLUCOPHAGE) 500 MG tablet Take 1 tablet (500 mg total) by mouth 2 (two) times daily with a meal.  . Multiple Vitamin (MULTIVITAMIN WITH MINERALS) TABS tablet Take by mouth daily. 1 packet of 5 vitamins daily  . multivitamin-lutein (OCUVITE-LUTEIN) CAPS capsule  Take 3 capsules by mouth daily.  Marland Kitchen oxybutynin (DITROPAN) 5 MG tablet Take 1 tablet (5 mg total) by mouth 3 (three) times daily.  . potassium chloride SA (K-DUR,KLOR-CON) 20 MEQ tablet Take 1 tablet (20 mEq total) by mouth daily.  . pravastatin (PRAVACHOL) 20 MG tablet Take 1 tablet (20 mg total) by mouth every evening.  . vitamin B-12 (CYANOCOBALAMIN) 1000 MCG tablet Take 1,000 mcg by mouth daily.  Marland Kitchen warfarin (COUMADIN) 2.5 MG tablet Take 1 tablet (2.5 mg total) by mouth daily at 6 PM. Or as directed.  Marland Kitchen glucose blood (ONE TOUCH ULTRA TEST) test strip Use to check sugar once daily and as needed. Dx:E11.319 **One Touch Ultra**   No facility-administered encounter medications on file as of 02/21/2016.     Functional Status:   In your present state of health, do you have any difficulty performing the following activities: 02/21/2016 01/28/2016  Hearing? N N  Vision? N N  Difficulty concentrating or making decisions? N N  Walking or climbing stairs? N Y  Dressing or bathing? N N  Doing errands, shopping? N N  Preparing Food and eating ?  N -  Using the Toilet? N -  In the past six months, have you accidently leaked urine? N -  Do you have problems with loss of bowel control? N -  Managing your Medications? N -  Managing your Finances? N -  Housekeeping or managing your Housekeeping? N -  Some recent data might be hidden    Fall/Depression Screening:    PHQ 2/9 Scores 02/21/2016 09/14/2015 09/08/2015 09/15/2014 09/03/2013 09/05/2012  PHQ - 2 Score 0 0 0 0 0 0    Assessment:  Mr. Israelson continues to feel better after his recent hospital visit/ AVR surgery, and verbalizes a good understanding of self-health management of chronic disease states of CHF, Atrial Fibrillation, and DM. Mr. Guidice reports a supportive family system and denies community resource needs.  No safety issues were identified during today's in-home visit.  Mr. Pathak is committed to attending his scheduled provider appointments and  to continued self-health management for his chronic disease states of CHF, Atrial Fibrillation, and DM.  Plan:   Patient will take his medications as they are prescribed and will attend all scheduled provider appointments.  Patient will continue to monitor his daily weights and any signs/ symptoms he experiences of fluid overload.  Patient will promptly notify his providers for any new concerns, issues, or problems that arise.  [place] outreach to continue with scheduled in-home visit next week.  I appreciate the opportunity to participate in Mr. Baehr's care,   Oneta Rack, RN, BSN, [employer] Coordinator [facility] Care Management  [phone]

## 2016-02-23 ENCOUNTER — Ambulatory Visit (INDEPENDENT_AMBULATORY_CARE_PROVIDER_SITE_OTHER): Payer: PPO | Admitting: *Deleted

## 2016-02-23 ENCOUNTER — Ambulatory Visit (INDEPENDENT_AMBULATORY_CARE_PROVIDER_SITE_OTHER): Payer: PPO

## 2016-02-23 ENCOUNTER — Encounter: Payer: Self-pay | Admitting: Family Medicine

## 2016-02-23 DIAGNOSIS — I481 Persistent atrial fibrillation: Secondary | ICD-10-CM

## 2016-02-23 DIAGNOSIS — Z953 Presence of xenogenic heart valve: Secondary | ICD-10-CM | POA: Diagnosis not present

## 2016-02-23 DIAGNOSIS — I4819 Other persistent atrial fibrillation: Secondary | ICD-10-CM

## 2016-02-23 DIAGNOSIS — Z5181 Encounter for therapeutic drug level monitoring: Secondary | ICD-10-CM

## 2016-02-23 DIAGNOSIS — Z8679 Personal history of other diseases of the circulatory system: Secondary | ICD-10-CM | POA: Diagnosis not present

## 2016-02-23 DIAGNOSIS — Z9889 Other specified postprocedural states: Secondary | ICD-10-CM

## 2016-02-23 LAB — POCT INR: INR: 2.4

## 2016-02-28 ENCOUNTER — Other Ambulatory Visit: Payer: Self-pay | Admitting: *Deleted

## 2016-02-28 ENCOUNTER — Encounter: Payer: Self-pay | Admitting: Internal Medicine

## 2016-02-28 NOTE — Patient Outreach (Signed)
[name]) Care Management [place] Telephone Outreach, Transition of Care, day 26 02/28/2016  Bill [name] [birth year] [id number]  Successful telephone outreach to Bill [name], 75 y.o. male referred to [facility] for transition of care after recent hospitalization September 7-13, 2017 for symptomatic Aortic stenosis and persistent Atrial fibrillation. Patient underwent surgical AVR on January 27, 2016 and was discharged home to self care. Patient has co-existing diagnosis of CHF, DM, HTN, CKD, hyperlipemia, and obesity. HIPAA/ identity verified.  Today, Bill [name] reports that he is "doing fine," and denies pain, problems, concerns, or issues.  Bill [name] reports that he has all of his medications and has continued taking them as they are prescribed. Bill [name] continues to reportthat he is monitoring his daily weights, and reports a weight today of 233 pounds.  We discussed/reviewed Bill [name] upcoming scheduled provider appointments, and he verbalizes an accurate report of scheduled appointments.  We discussed that Bill [name] has almost completed his transition of care period since his hospital discharge, and positive reinforcement and encouragement were provided to patient for the progress he has made thus far in his recuperation.  Bill [name] denies further needs, questions, concerns, issues or problems today.  Plan:   Patient will take his medications as they are prescribed and will attend all scheduled provider appointments.  Patient will continue to monitor his daily weights and any signs/ symptoms he experiences of fluid overload.  Patient will promptly notify his providers for any new concerns, issues, or problems that arise.  [place] outreach to continue with scheduled telephone call for transition of care next week.   Oneta Rack, RN, BSN, [employer] [facility] Care Management  [phone]

## 2016-03-01 ENCOUNTER — Encounter: Payer: Self-pay | Admitting: Physician Assistant

## 2016-03-01 ENCOUNTER — Ambulatory Visit (INDEPENDENT_AMBULATORY_CARE_PROVIDER_SITE_OTHER): Payer: PPO | Admitting: *Deleted

## 2016-03-01 DIAGNOSIS — Z8679 Personal history of other diseases of the circulatory system: Secondary | ICD-10-CM

## 2016-03-01 DIAGNOSIS — Z9889 Other specified postprocedural states: Secondary | ICD-10-CM

## 2016-03-01 DIAGNOSIS — Z953 Presence of xenogenic heart valve: Secondary | ICD-10-CM

## 2016-03-01 DIAGNOSIS — Z5181 Encounter for therapeutic drug level monitoring: Secondary | ICD-10-CM

## 2016-03-01 LAB — POCT INR: INR: 3

## 2016-03-01 NOTE — Progress Notes (Signed)
This is an addendum to previously dictated discharge summary: Patient was NOT discharged on a beta blocker as he was on Tikosyn for a fib. He was on Tikosyn prior to surgery. Will consider starting as an outpatient if not on Tikosyn.

## 2016-03-02 ENCOUNTER — Other Ambulatory Visit: Payer: Self-pay | Admitting: Thoracic Surgery (Cardiothoracic Vascular Surgery)

## 2016-03-02 DIAGNOSIS — Z952 Presence of prosthetic heart valve: Secondary | ICD-10-CM

## 2016-03-06 ENCOUNTER — Other Ambulatory Visit: Payer: Self-pay | Admitting: *Deleted

## 2016-03-06 ENCOUNTER — Encounter: Payer: Self-pay | Admitting: *Deleted

## 2016-03-06 ENCOUNTER — Encounter: Payer: Self-pay | Admitting: Thoracic Surgery (Cardiothoracic Vascular Surgery)

## 2016-03-06 ENCOUNTER — Ambulatory Visit
Admission: RE | Admit: 2016-03-06 | Discharge: 2016-03-06 | Disposition: A | Discharge status: Home / Self Care | Payer: PPO | Source: Ambulatory Visit | Attending: Thoracic Surgery (Cardiothoracic Vascular Surgery) | Admitting: Thoracic Surgery (Cardiothoracic Vascular Surgery)

## 2016-03-06 ENCOUNTER — Ambulatory Visit (INDEPENDENT_AMBULATORY_CARE_PROVIDER_SITE_OTHER): Payer: Self-pay | Admitting: Thoracic Surgery (Cardiothoracic Vascular Surgery)

## 2016-03-06 VITALS — BP 109/67 | HR 72 | Resp 16 | Ht 74.0 in | Wt 232.0 lb

## 2016-03-06 DIAGNOSIS — Z9889 Other specified postprocedural states: Secondary | ICD-10-CM

## 2016-03-06 DIAGNOSIS — Z952 Presence of prosthetic heart valve: Secondary | ICD-10-CM

## 2016-03-06 DIAGNOSIS — Z8679 Personal history of other diseases of the circulatory system: Secondary | ICD-10-CM

## 2016-03-06 DIAGNOSIS — J90 Pleural effusion, not elsewhere classified: Secondary | ICD-10-CM | POA: Diagnosis not present

## 2016-03-06 NOTE — Progress Notes (Signed)
[address]       [place] [address]             [place] OFFICE NOTE  Referring Provider is [name], MD  Primary Cardiologist is [name], MD PCP is [name], MD   HPI:  Patient returns to the office today for routine follow-up status post aortic valve replacement using a bioprosthetic tissue valve, Maze procedure with clipping of the left atrial appendage, and closure of a patent foramen ovale on 01/27/2016.  His early postoperative recovery in the hospital was uncomplicated and he was discharged home on the sixth postoperative day.  He did go back into atrial fibrillation prior to hospital discharge. Since hospital discharge the patient has done well. He has had his Coumadin dose monitored and adjusted through the Coumadin clinic. His most recent INR was 3.0. He was seen in follow-up by Richardson Dopp at [facility] 02/16/2016. EKG performed at that time revealed junctional rhythm. He was started on a statin at that time and a 48 hour Holter monitor was ordered. The patient states that the Holter monitor device was returned last week. Results remain pending. He returns to our office today and reports that he feels quite well. He has not had any significant pain in his chest and he is not taking any sort of pain relievers. He is walking up to one half a mile every day. He wants to resume driving an automobile. He denies any palpitations or dizzy spells. Appetite is fair. He has continued to lose weight. According to the patient he is now down 20 pounds below his preoperative baseline.  He has not heard from the outpatient cardiac rehabilitation program. Overall he is quite pleased with his progress.    Current Outpatient Prescriptions  Medication Sig Dispense Refill  . acetaminophen (TYLENOL) 325 MG tablet Take 650 mg by mouth every 4 (four) hours as needed for mild pain or fever.    Marland Kitchen aspirin EC 81 MG EC tablet Take  1 tablet (81 mg total) by mouth daily.    . cholecalciferol (VITAMIN D) 1000 UNITS tablet Take 1,000 Units by mouth every morning.     . dofetilide (TIKOSYN) 250 MCG capsule Take 1 capsule (250 mcg total) by mouth 2 (two) times daily. 60 capsule 8  . furosemide (LASIX) 40 MG tablet Take 1 tablet (40 mg total) by mouth daily. 30 tablet 3  . glipiZIDE (GLUCOTROL) 10 MG tablet Take 5-10 mg by mouth 2 (two) times daily before a meal. Take 10 mg by mouth daily with breakfast, 5 mg daily with dinner    . glucose blood (ONE TOUCH ULTRA TEST) test strip Use to check sugar once daily and as needed. Dx:E11.319 **One Touch Ultra** 100 each 2  . Magnesium Oxide 250 MG TABS Take 1 tablet by mouth daily as needed (supplement).     . metFORMIN (GLUCOPHAGE) 500 MG tablet Take 1 tablet (500 mg total) by mouth 2 (two) times daily with a meal. 60 tablet 3  . Multiple Vitamin (MULTIVITAMIN WITH MINERALS) TABS tablet Take by mouth daily. 1 packet of 5 vitamins daily    . multivitamin-lutein (OCUVITE-LUTEIN) CAPS capsule Take 3 capsules by mouth daily.    Marland Kitchen oxybutynin (DITROPAN) 5 MG tablet Take 1 tablet (5 mg total) by mouth 3 (three) times daily. 270 tablet 3  . potassium chloride SA (K-DUR,KLOR-CON) 20 MEQ tablet Take 1 tablet (20 mEq total)  by mouth daily. 30 tablet 3  . pravastatin (PRAVACHOL) 20 MG tablet Take 1 tablet (20 mg total) by mouth every evening. 90 tablet 3  . vitamin B-12 (CYANOCOBALAMIN) 1000 MCG tablet Take 1,000 mcg by mouth daily.    Marland Kitchen warfarin (COUMADIN) 2.5 MG tablet Take 1 tablet (2.5 mg total) by mouth daily at 6 PM. Or as directed. 30 tablet 1   No current facility-administered medications for this visit.       Physical Exam:   BP 109/67   Pulse 72   Resp 16   Ht 6\' 2"  (1.88 m)   Wt 232 lb (105.2 kg)   SpO2 97% Comment: RA  BMI 29.79 kg/m   General:  Well-appearing  Chest:   Clear to auscultation  CV:   Irregular rhythm  Incisions:  Healing nicely, sternum is  stable  Abdomen:  Soft and nontender  Extremities:  Warm and well-perfused  Diagnostic Tests:  CHEST  2 VIEW  COMPARISON:  Chest radiograph 02/07/2016.  FINDINGS: Stable cardiac and mediastinal contours status post median sternotomy. Interval decrease in small left pleural effusion with underlying pulmonary consolidation. Right lung is clear. Thoracic spine degenerative changes.  IMPRESSION: Interval decrease in size of small left pleural effusion with underlying consolidation favored represent atelectasis.   Electronically Signed   By: Lovey Newcomer M.D.   On: 03/06/2016 10:12    Impression:  Patient is doing well approximately 6 weeks status post aortic valve replacement using a bioprosthetic tissue valve, Maze procedure with clipping of the left atrial appendage, and closure of patent foramen ovale. His rhythm is irregular today but it is difficult to discern whether or not he is in an atypical flutter versus junctional rhythm with variable conduction versus atrial fibrillation. Clinically he looks quite good. He may be a bit dehydrated and his weight is 20 pounds below his preoperative baseline.  Plan:  I have instructed the patient to stop taking Lasix and potassium on a regular basis. The patient will continue to monitor his weight on a daily basis and resume taking Lasix and potassium as needed should he develop weight gain or other signs of fluid retention. The patient is scheduled to follow-up with Dr. Lovena Le later this week. Presumably results from his recent Holter monitor will be available. Given his recent valve replacement would recommend continuing warfarin anticoagulation for a total of 3 months from the time of surgery. At that time it would be reasonable to consider resuming NOAC versus stopping anticoagulation altogether depending upon his rhythm and anticipated risks of stroke.  At some point the patient will need routine follow-up echocardiogram performed.  I  have encouraged patient to continue to increase his physical activity but I have reminded him to refrain from heavy lifting or strenuous use of his arms or shoulders for at least another 6-8 weeks. I think he may resume driving an automobile. I have encouraged him to enroll and participate in outpatient cardiac rehabilitation program. The patient will return to our office for routine follow-up and rhythm check in approximately 2 months.      Valentina Gu. Roxy Manns, MD 03/06/2016 10:53 AM

## 2016-03-06 NOTE — Patient Outreach (Signed)
[facility]) Care Management [place] Telephone Outreach, Transition of Care day 33 03/06/2016  THEADORE BLUNCK 04-Feb-1941 [id number]  Successful telephone outreach to [name], 75 y.o.malereferred to [place] for transition of care after recent hospitalization September 7-13, 2017 for symptomatic Aortic stenosis and persistent Atrial fibrillation. Patient underwent surgical AVR on January 27, 2016 and was discharged home to self care. Patient has co-existing diagnosis of CHF, DM, HTN, CKD, hyperlipemia, and obesity. HIPAA/ identity verified.  Today, Mr. Brazie to report that he is "doing fine," and denies pain, problems, concerns, or issues.  Mr. Parisi reports that he attended follow up appointment with Dr. Roxy Manns this morning, and "got a good report," where he was told that he could resume driving and begin increasing his activity.  Patient reports that he has all of his medications and has continued taking them as they are prescribed. Mr. Walkins to reportthat he is monitoring his daily weights, and reports that he "has not gained any weight."  We discussed today that Mr. Degroff has completed his transition of care period since his hospital discharge, and has successfully met his previously established [place] CM goals.  Mr. Heslin states today that he does not believe he needs further [facility] CM follow up, as he is "doing so good."  Positive reinforcement and encouragement were provided to patient for the progress he has made in his recuperation.  Mr. Ureta further needs, questions, concerns, issues or problems today, and I confirmed that he has my direct telephone number, as well as the numbers for the main [facility] CM office and the [facility] 24-hour nurse advice line should he wish to contact [facility] in the future.  Plan:   Will close [place] CM case, as patient has successfully met his goals, and notify patient's  PCP of same.  Oneta Rack, RN, BSN, [place] Care Management  [phone]                 Oneta Rack, RN, BSN, [place] Coordinator [facility] Care Management  [phone]

## 2016-03-06 NOTE — Patient Instructions (Addendum)
Stop taking lasix and potassium on a daily basis but continue to monitor your weight on a daily basis.  Resume taking lasix and potassium if your weight goes up and/or you develop swelling or other signs of fluid retention.  Continue all other previous medications without any changes at this time  Continue to avoid any heavy lifting or strenuous use of your arms or shoulders for at least a total of three months from the time of surgery.  After three months you may gradually increase how much you lift or otherwise use your arms or chest as tolerated, with limits based upon whether or not activities lead to the return of significant discomfort.  You may return to driving an automobile as long as you are no longer requiring oral narcotic pain relievers during the daytime.  It would be wise to start driving only short distances during the daylight and gradually increase from there as you feel comfortable.  Endocarditis is a potentially serious infection of heart valves or inside lining of the heart.  It occurs more commonly in patients with diseased heart valves (such as patient's with aortic or mitral valve disease) and in patients who have undergone heart valve repair or replacement.  Certain surgical and dental procedures may put you at risk, such as dental cleaning, other dental procedures, or any surgery involving the respiratory, urinary, gastrointestinal tract, gallbladder or prostate gland.   To minimize your chances for develooping endocarditis, maintain good oral health and seek prompt medical attention for any infections involving the mouth, teeth, gums, skin or urinary tract.    Always notify your doctor or dentist about your underlying heart valve condition before having any invasive procedures. You will need to take antibiotics before certain procedures, including all routine dental cleanings or other dental procedures.  Your cardiologist or dentist should prescribe these antibiotics for you  to be taken ahead of time.

## 2016-03-09 ENCOUNTER — Encounter: Payer: Self-pay | Admitting: Internal Medicine

## 2016-03-09 ENCOUNTER — Ambulatory Visit (INDEPENDENT_AMBULATORY_CARE_PROVIDER_SITE_OTHER): Payer: PPO | Admitting: Internal Medicine

## 2016-03-09 ENCOUNTER — Ambulatory Visit (INDEPENDENT_AMBULATORY_CARE_PROVIDER_SITE_OTHER): Payer: PPO

## 2016-03-09 VITALS — BP 116/78 | HR 75 | Ht 74.0 in | Wt 238.0 lb

## 2016-03-09 DIAGNOSIS — I481 Persistent atrial fibrillation: Secondary | ICD-10-CM

## 2016-03-09 DIAGNOSIS — Z953 Presence of xenogenic heart valve: Secondary | ICD-10-CM | POA: Diagnosis not present

## 2016-03-09 DIAGNOSIS — Z9889 Other specified postprocedural states: Secondary | ICD-10-CM | POA: Diagnosis not present

## 2016-03-09 DIAGNOSIS — Z5181 Encounter for therapeutic drug level monitoring: Secondary | ICD-10-CM

## 2016-03-09 DIAGNOSIS — Z8679 Personal history of other diseases of the circulatory system: Secondary | ICD-10-CM | POA: Diagnosis not present

## 2016-03-09 DIAGNOSIS — I4819 Other persistent atrial fibrillation: Secondary | ICD-10-CM

## 2016-03-09 LAB — POCT INR: INR: 2.3

## 2016-03-09 NOTE — Patient Instructions (Addendum)
Your physician recommends that you continue on your current medications as directed. Please refer to the Current Medication list given to you today.  ONLY TAKE  FUROSEMIDE AS  NEEDED  Your physician wants you to follow-up in: [place] will receive a reminder letter in the mail two months in advance. If you don't receive a letter, please call our office to schedule the follow-up appointment.

## 2016-03-09 NOTE — Progress Notes (Signed)
HPI Bill [name] returns today for EP followup.He is a 75 yo man with multiple medical problems including AS (mod-severe) and MR, who has atrial fib, who has multiple stroke risk factors and has also had bleeding in his eye. He underwent valve replacement several weeks ago. He has had atrial fibrillation. He had a MAZE with his surgery. He has been placed on dofetilide. He is concerned about the $200/month cost. He does not have palpitations. Interpretation of his Holter was made more difficult by the low amplitude of his P waves. He appears to have NSR with marked first degree AV block. He is bradycardic much of the time but is asymptomatic. He has been weak and has been slow to get his strength back. He denies peripheral edema and has begun taking lasix prn.  Allergies  Allergen Reactions  . No Known Allergies      Current Outpatient Prescriptions  Medication Sig Dispense Refill  . acetaminophen (TYLENOL) 325 MG tablet Take 650 mg by mouth every 4 (four) hours as needed for mild pain or fever.    Marland Kitchen aspirin EC 81 MG EC tablet Take 1 tablet (81 mg total) by mouth daily.    . cholecalciferol (VITAMIN D) 1000 UNITS tablet Take 1,000 Units by mouth every morning.     . dofetilide (TIKOSYN) 250 MCG capsule Take 1 capsule (250 mcg total) by mouth 2 (two) times daily. 60 capsule 8  . furosemide (LASIX) 40 MG tablet Take 1 tablet (40 mg total) by mouth daily. 30 tablet 3  . glipiZIDE (GLUCOTROL) 10 MG tablet Take 5-10 mg by mouth 2 (two) times daily before a meal. Take 10 mg by mouth daily with breakfast, 5 mg daily with dinner    . glucose blood (ONE TOUCH ULTRA TEST) test strip Use to check sugar once daily and as needed. Dx:E11.319 **One Touch Ultra** 100 each 2  . Magnesium Oxide 250 MG TABS Take 1 tablet by mouth daily as needed (supplement).     . metFORMIN (GLUCOPHAGE) 500 MG tablet Take 1 tablet (500 mg total) by mouth 2 (two) times daily with a meal. 60 tablet 3  . Multiple Vitamin  (MULTIVITAMIN WITH MINERALS) TABS tablet Take by mouth daily. 1 packet of 5 vitamins daily    . multivitamin-lutein (OCUVITE-LUTEIN) CAPS capsule Take 3 capsules by mouth daily.    Marland Kitchen oxybutynin (DITROPAN) 5 MG tablet Take 1 tablet (5 mg total) by mouth 3 (three) times daily. 270 tablet 3  . potassium chloride SA (K-DUR,KLOR-CON) 20 MEQ tablet Take 1 tablet (20 mEq total) by mouth daily. 30 tablet 3  . pravastatin (PRAVACHOL) 20 MG tablet Take 1 tablet (20 mg total) by mouth every evening. 90 tablet 3  . vitamin B-12 (CYANOCOBALAMIN) 1000 MCG tablet Take 1,000 mcg by mouth daily.    Marland Kitchen warfarin (COUMADIN) 2.5 MG tablet Take 1 tablet (2.5 mg total) by mouth daily at 6 PM. Or as directed. 30 tablet 1   No current facility-administered medications for this visit.      Past Medical History:  Diagnosis Date  . Aortic stenosis 12/2010   s/p bioprosthetic AVR 9/17 // Echo 6/17: EF 55-60, mean AV 25 mmHg // TEE 6/17: severe AS, AVA 0.93, mean AV 43 mmHg // post op Echo 02/07/16: mod conc LVH, EF 65-70, vigorous LVF, AVR ok, mean 9 mmHg, MAC, trivial MR, mod LAE, trivial pericardial eff  . Chronic diastolic CHF (congestive heart failure) ([place])   . Coronary  artery disease    LHC 8/17: pLAD 20, mLAD 30, dLAD 20, mRCA 30, dRCA 20, RPDA 20  . Exogenous obesity   . GERD (gastroesophageal reflux disease)    occasional  . History of chicken pox   . Hypertension   . Kidney stones remote  . Lyme disease 2012   ?titers negative  . Neuromuscular disorder (HCC)    tremor right arm  . Persistent atrial fibrillation (HCC)    Tikosyn // s/p Maze procedure 9/17 // Anticoagulation undesirable 2/2 hx of retinal hemorrhage (worked up for [facility] [employer] [facility] but referred for Maze at time of AVR 2/2 aortic stenosis)   . PFO (patent foramen ovale)    s/p PFO closure at time of AVR in 9/17  . S/P aortic valve replacement with bioprosthetic valve 01/27/2016   27 mm [facility] Ease bovine pericardial tissue valve  . S/P Maze  operation for atrial fibrillation 01/27/2016   Complete bilateral atrial lesion set using bipolar radiofrequency and cryothermy ablation with clipping of LA appendage  . Type 2 diabetes, controlled, with retinopathy ([place]) 1980s  . Urge incontinence   . Vitamin B12 deficiency    IF normal (2014)    ROS:   All systems reviewed and negative except as noted in the HPI.   Past Surgical History:  Procedure Laterality Date  . AORTIC VALVE REPLACEMENT N/A 01/27/2016   Procedure: AORTIC VALVE REPLACEMENT (AVR);  Surgeon: Bill Alberts, MD;  Location: [place];  Service: Open Heart Surgery;  Laterality: N/A;  . BACK SURGERY  20 yrs ago   removal of lower back cartilage due to damage. Patient does not remember date of surgery.  Marland Kitchen CARDIAC CATHETERIZATION N/A 01/05/2016   Procedure: Right/Left Heart Cath and Coronary Angiography;  Surgeon: Bill Blanks, MD;  Location: [place] [facility];  Service: Cardiovascular;  Laterality: N/A;  . CARDIOVERSION N/A 02/25/2015   Procedure: CARDIOVERSION;  Surgeon: Bill Latch, MD;  Location: [facility] ENDOSCOPY;  Service: Cardiovascular;  Laterality: N/A;  . CATARACT EXTRACTION Bilateral 2015   Bill [name]  . COLONOSCOPY    . EYE SURGERY     left eye "cleaned"  . finger sx    . kidney stone removal    . MAZE N/A 01/27/2016   Procedure: MAZE;  Surgeon: Bill Alberts, MD;  Location: [place];  Service: Open Heart Surgery;  Laterality: N/A;  . TEE WITHOUT CARDIOVERSION N/A 11/05/2015   Procedure: TRANSESOPHAGEAL ECHOCARDIOGRAM (TEE);  Surgeon: Bill Casino, MD;  Location: [place];  Service: Cardiovascular;  Laterality: N/A;  . TEE WITHOUT CARDIOVERSION N/A 01/27/2016   Procedure: TRANSESOPHAGEAL ECHOCARDIOGRAM (TEE);  Surgeon: Bill Alberts, MD;  Location: [place];  Service: Open Heart Surgery;  Laterality: N/A;  . TOTAL HIP ARTHROPLASTY  03/12/2012   RIGHT TOTAL HIP ARTHROPLASTY ANTERIOR APPROACH;  Surgeon: Bill Rossetti, MD;  . US  ECHOCARDIOGRAPHY  12/2010   EF 50-55%, mild LVH, normal wall motion, high LV filling pressures, mild AS, LA mildly dilated     Family History  Problem Relation Age of Onset  . Diabetes Mother   . Heart disease Mother   . Diabetes Father   . Heart disease Father   . Cancer Brother     throat     Social History   Social History  . Marital status: Married    Spouse name: N/A  . Number of children: N/A  . Years of education: N/A   Occupational History  . Not on file.  Social History Main Topics  . Smoking status: Never Smoker  . Smokeless tobacco: Never Used  . Alcohol use Yes     Comment: Regular (bourbon and coke 2-3/day)  . Drug use: No  . Sexual activity: No   Other Topics Concern  . Not on file   Social History Narrative   Caffeine: coffee 1 cup/day   Lives with wife Bill [name]), 1 cat   Occupation: retired, worked for [employer]   Edu: BS   Activity: no regular exercise   Diet: some water, fruits/vegetables daily      Advanced directives: has living will at home. Does not want prolonged life support "no extraordinary measures" but would accept temporary measures. Would want wife to be HCPOA.      BP 116/78   Pulse 75   Ht 6\' 2"  (1.88 m)   Wt 238 lb (108 kg)   BMI 30.56 kg/m   Physical Exam:  Well appearing 75 yo man, NAD HEENT: Unremarkable Neck:  7 JVD, no thyromegally Back:  No CVA tenderness Lungs:  Scattered rales with no wheezes HEART:  Regular rate rhythm, 2/6 AS murmur, no rubs, no clicks Abd:  soft, positive bowel sounds, no organomegally, no rebound, no guarding Ext:  2 plus pulses, trace peripheral edema, no cyanosis, no clubbing Skin:  No rashes no nodules Neuro:  CN II through XII intact, motor grossly intact  EKG - atrial fib with a controlled VR   Assess/Plan: 1. Atrial fib - he is in atrial fib on his ECG today. I suspect he is going in and out of rhythm. He is asymptomatic. We discussed the treatment options. For now,  I would like him  to continue his Coumadin and dofetilide. I would consider stopping the dofetilide when I see him in March.  2. Aortic stenosis - he has undergone valve replacement. He is stable.  3. Chronic diastolic heart failure - his symptoms are class 2. He is now off of his lasix and appears euvolemic. 4. Bleeding - I do not have these records but he tells me he has had bleeding behind his eye. Will continue warfarin. I have some concerns about stopping his warfarin but realize he is at risk for bleeding. At this point, if he were to have another bleed I think we would be forced to stop his warfarin. Also, he is on low dose ASA. I will defer the decision to stop this drug to Bill [name].  Cristopher Peru, M.D.

## 2016-03-13 ENCOUNTER — Ambulatory Visit (INDEPENDENT_AMBULATORY_CARE_PROVIDER_SITE_OTHER): Payer: PPO | Admitting: Family Medicine

## 2016-03-13 ENCOUNTER — Encounter: Payer: Self-pay | Admitting: Family Medicine

## 2016-03-13 ENCOUNTER — Telehealth: Payer: Self-pay

## 2016-03-13 VITALS — BP 134/74 | HR 80 | Temp 98.2°F | Wt 242.0 lb

## 2016-03-13 DIAGNOSIS — R1310 Dysphagia, unspecified: Secondary | ICD-10-CM | POA: Diagnosis not present

## 2016-03-13 DIAGNOSIS — Z953 Presence of xenogenic heart valve: Secondary | ICD-10-CM | POA: Diagnosis not present

## 2016-03-13 DIAGNOSIS — Z5181 Encounter for therapeutic drug level monitoring: Secondary | ICD-10-CM | POA: Diagnosis not present

## 2016-03-13 DIAGNOSIS — R0600 Dyspnea, unspecified: Secondary | ICD-10-CM

## 2016-03-13 DIAGNOSIS — Z9889 Other specified postprocedural states: Secondary | ICD-10-CM

## 2016-03-13 DIAGNOSIS — R300 Dysuria: Secondary | ICD-10-CM | POA: Diagnosis not present

## 2016-03-13 DIAGNOSIS — Z8679 Personal history of other diseases of the circulatory system: Secondary | ICD-10-CM

## 2016-03-13 DIAGNOSIS — I480 Paroxysmal atrial fibrillation: Secondary | ICD-10-CM

## 2016-03-13 DIAGNOSIS — I8392 Asymptomatic varicose veins of left lower extremity: Secondary | ICD-10-CM | POA: Insufficient documentation

## 2016-03-13 LAB — POC URINALSYSI DIPSTICK (AUTOMATED)
BILIRUBIN UA: NEGATIVE
Glucose, UA: NEGATIVE
KETONES UA: 5
NITRITE UA: NEGATIVE
RBC UA: NEGATIVE
Urobilinogen, UA: NEGATIVE
pH, UA: 6

## 2016-03-13 LAB — POCT INR: INR: 3.9

## 2016-03-13 NOTE — Assessment & Plan Note (Signed)
Evidence of this on exam, anticipate pain stemming from inflamed vein with CVI changes. No signs of DVT or cellulitis.

## 2016-03-13 NOTE — Assessment & Plan Note (Signed)
Update INR on unknown antibiotic, presumed doxycycline.

## 2016-03-13 NOTE — Progress Notes (Signed)
BP 134/74   Pulse 80   Temp 98.2 F (36.8 C) (Oral)   Wt 242 lb (109.8 kg)   BMI 31.07 kg/m    CC: multiple issues Subjective:    Patient ID: Bill [name], male    DOB: Feb 10, 1941, 75 y.o.   MRN: [id number]  HPI: JACOBANTHONY [name] is a 75 y.o. male presenting on 03/13/2016 for Leg Pain (left); Urinary Tract Infection (burning with urination; has been taking leftover abx); Trouble swallowing; and Epistaxis (just started this AM)   Not seen here in 6 months. Recent AVR with MAZE 01/2016, recovered well however ongoing atrial fibrillation on coumadin and tikosyn.   5d h/o L leg pain, points to lateral lower leg, denies bug bites or inciting trauma.   Also with 5d h/o dysphagia without odynophagia. TEE performed 01/2016 but no trouble after this. + early satiety. No abd pain, nausea/vomiting, fevers/chills. Weight gain noted.   UTI - 5d h/o dysuria, started using unknown antibiotic likely doxycycline 7d course "starts with a D". Urine symptoms are improving.   Relevant past medical, surgical, family and social history reviewed and updated as indicated. Interim medical history since our last visit reviewed. Allergies and medications reviewed and updated. Current Outpatient Prescriptions on File Prior to Visit  Medication Sig  . acetaminophen (TYLENOL) 325 MG tablet Take 650 mg by mouth every 4 (four) hours as needed for mild pain or fever.  Marland Kitchen aspirin EC 81 MG EC tablet Take 1 tablet (81 mg total) by mouth daily.  . cholecalciferol (VITAMIN D) 1000 UNITS tablet Take 1,000 Units by mouth every morning.   . dofetilide (TIKOSYN) 250 MCG capsule Take 1 capsule (250 mcg total) by mouth 2 (two) times daily.  Marland Kitchen glipiZIDE (GLUCOTROL) 10 MG tablet Take 5-10 mg by mouth 2 (two) times daily before a meal. Take 10 mg by mouth daily with breakfast, 5 mg daily with dinner  . glucose blood (ONE TOUCH ULTRA TEST) test strip Use to check sugar once daily and as needed. Dx:E11.319 **One Touch Ultra**  .  Magnesium Oxide 250 MG TABS Take 1 tablet by mouth daily as needed (supplement).   . metFORMIN (GLUCOPHAGE) 500 MG tablet Take 1 tablet (500 mg total) by mouth 2 (two) times daily with a meal.  . Multiple Vitamin (MULTIVITAMIN WITH MINERALS) TABS tablet Take by mouth daily. 1 packet of 5 vitamins daily  . multivitamin-lutein (OCUVITE-LUTEIN) CAPS capsule Take 3 capsules by mouth daily.  Marland Kitchen oxybutynin (DITROPAN) 5 MG tablet Take 1 tablet (5 mg total) by mouth 3 (three) times daily.  . pravastatin (PRAVACHOL) 20 MG tablet Take 1 tablet (20 mg total) by mouth every evening.  . vitamin B-12 (CYANOCOBALAMIN) 1000 MCG tablet Take 1,000 mcg by mouth daily.  Marland Kitchen warfarin (COUMADIN) 2.5 MG tablet Take 1 tablet (2.5 mg total) by mouth daily at 6 PM. Or as directed.   No current facility-administered medications on file prior to visit.     Review of Systems Per HPI unless specifically indicated in ROS section     Objective:    BP 134/74   Pulse 80   Temp 98.2 F (36.8 C) (Oral)   Wt 242 lb (109.8 kg)   BMI 31.07 kg/m   Wt Readings from Last 3 Encounters:  03/13/16 242 lb (109.8 kg)  03/09/16 238 lb (108 kg)  03/06/16 232 lb (105.2 kg)    Physical Exam  Constitutional: He appears well-developed and well-nourished. No distress.  HENT:  Mouth/Throat: Oropharynx  is clear and moist. No oropharyngeal exudate.  Eyes: Conjunctivae are normal. Pupils are equal, round, and reactive to light.  Neck: Normal range of motion. Neck supple.  Cardiovascular: Normal rate, regular rhythm, normal heart sounds and intact distal pulses.   No murmur heard. Pulmonary/Chest: Effort normal and breath sounds normal. No respiratory distress. He has no wheezes. He has no rales.  Abdominal: Soft. Normal appearance and bowel sounds are normal. He exhibits no distension and no mass. There is no hepatosplenomegaly. There is no tenderness. There is no rigidity, no rebound, no guarding, no CVA tenderness and negative Murphy's  sign.  Musculoskeletal: He exhibits no edema.  No palpable cords  Neurological: He is alert.  Marked tremor present  Skin: Skin is warm and dry. No rash noted. No erythema.  Discomfort to palpation at small nodule along spider vein L lateral lower leg   Nursing note and vitals reviewed.  Results for orders placed or performed in visit on 03/13/16  POCT INR  Result Value Ref Range   INR 3.9   POCT Urinalysis Dipstick (Automated)  Result Value Ref Range   Color, UA dark yellow    Clarity, UA cloudy    Glucose, UA negative    Bilirubin, UA negative    Ketones, UA 5    Spec Grav, UA >=1.030    Blood, UA negative    pH, UA 6.0    Protein, UA 1+    Urobilinogen, UA negative    Nitrite, UA negative    Leukocytes, UA small (1+) (A) Negative   Lab Results  Component Value Date   HGBA1C 6.4 (H) 01/25/2016       Assessment & Plan:  New epistaxis endorsed this morning - our office heating system has been on all weekend and air was very dry. He has also been on abx that could possibly interact with his coumadin levels. Problem List Items Addressed This Visit    Atrial fibrillation (HCC) (Chronic)    Continue coumadin and tikosyn      Relevant Medications   furosemide (LASIX) 40 MG tablet   Dysphagia    Unclear cause. Start evaluation with barium swallow.       Relevant Orders   DG Esophagus   Dysuria    Check UA today despite 5+ d presumed doxycycline. Pt endorses sxs improving. Encouraged to seek evaluation prior to starting antibiotic. --> they will call us back with name of antibiotic.      Relevant Orders   POCT Urinalysis Dipstick (Automated) (Completed)   Encounter for therapeutic drug monitoring - Primary    Update INR on unknown antibiotic, presumed doxycycline.       Relevant Orders   POCT INR   S/P Maze operation for atrial fibrillation   Spider vein of left lower extremity    Evidence of this on exam, anticipate pain stemming from inflamed vein with CVI  changes. No signs of DVT or cellulitis.       Relevant Medications   furosemide (LASIX) 40 MG tablet    Other Visit Diagnoses    Status post aortic valve replacement with bioprosthetic valve       Relevant Orders   POCT INR   Dyspnea, unspecified type       Relevant Medications   furosemide (LASIX) 40 MG tablet   potassium chloride SA (K-DUR,KLOR-CON) 20 MEQ tablet       Follow up plan: No Follow-up on file.  [name], MD

## 2016-03-13 NOTE — Progress Notes (Signed)
Pre visit review using our clinic review tool, if applicable. No additional management support is needed unless otherwise documented below in the visit note. 

## 2016-03-13 NOTE — Assessment & Plan Note (Signed)
Continue coumadin and tikosyn

## 2016-03-13 NOTE — Assessment & Plan Note (Addendum)
Unclear cause. Start evaluation with barium swallow.

## 2016-03-13 NOTE — Telephone Encounter (Signed)
Pt left v/m pt was seen earlier today and pt was to cb with name of med taking; pt is presently taking doxycycline hyclate.

## 2016-03-13 NOTE — Patient Instructions (Addendum)
Urinalysis today.  INR today.  Call us with name of antibiotic. Finish antibiotic for now. Leg pain is possibly from vein - try rubbing voltaren gel or bengay and elevation of leg. Let us know if not improving.

## 2016-03-13 NOTE — Assessment & Plan Note (Addendum)
Check UA today despite 5+ d presumed doxycycline. Pt endorses sxs improving. Encouraged to seek evaluation prior to starting antibiotic. --> they will call us back with name of antibiotic.

## 2016-03-14 ENCOUNTER — Telehealth: Payer: Self-pay | Admitting: Family Medicine

## 2016-03-14 NOTE — Telephone Encounter (Signed)
Noted  

## 2016-03-14 NOTE — Telephone Encounter (Signed)
I don't see that anyone contacted patient yet re: INR and patient has not checked MyChart yet.  plz notify:  Mr Bitting,  Your coumadin level was thin due to doxycycline antibiotic. We have held coumadin for one daily, and the coumadin clinic should be in touch today to further dose coumadin.  Your urine test returned showing possible infection and urine was concentrated - increase water intake and let us know if ongoing symptoms despite finishing antibiotic.   Should have held coumadin x1 dose, then recommend 1/2 coumadin dose while on doxycycline then may restart prior dose. Recheck INR in 1 wk.

## 2016-03-14 NOTE — Telephone Encounter (Signed)
Noted.  May cancel.

## 2016-03-14 NOTE — Telephone Encounter (Signed)
Called the patient to schedule the Barium Swallow. He said he is not having the problem anymore and doesn't want the test scheduled. Please advise.

## 2016-03-15 ENCOUNTER — Ambulatory Visit (INDEPENDENT_AMBULATORY_CARE_PROVIDER_SITE_OTHER): Payer: PPO

## 2016-03-15 DIAGNOSIS — Z5181 Encounter for therapeutic drug level monitoring: Secondary | ICD-10-CM

## 2016-03-15 DIAGNOSIS — Z9889 Other specified postprocedural states: Secondary | ICD-10-CM

## 2016-03-15 DIAGNOSIS — Z8679 Personal history of other diseases of the circulatory system: Secondary | ICD-10-CM

## 2016-03-15 DIAGNOSIS — I480 Paroxysmal atrial fibrillation: Secondary | ICD-10-CM

## 2016-03-15 DIAGNOSIS — Z953 Presence of xenogenic heart valve: Secondary | ICD-10-CM

## 2016-03-15 NOTE — Telephone Encounter (Signed)
nvm - pt touched base with coumadin clinic today.

## 2016-03-23 ENCOUNTER — Ambulatory Visit (INDEPENDENT_AMBULATORY_CARE_PROVIDER_SITE_OTHER): Payer: PPO | Admitting: *Deleted

## 2016-03-23 DIAGNOSIS — Z8679 Personal history of other diseases of the circulatory system: Secondary | ICD-10-CM | POA: Diagnosis not present

## 2016-03-23 DIAGNOSIS — Z953 Presence of xenogenic heart valve: Secondary | ICD-10-CM

## 2016-03-23 DIAGNOSIS — I480 Paroxysmal atrial fibrillation: Secondary | ICD-10-CM

## 2016-03-23 DIAGNOSIS — Z5181 Encounter for therapeutic drug level monitoring: Secondary | ICD-10-CM | POA: Diagnosis not present

## 2016-03-23 DIAGNOSIS — Z9889 Other specified postprocedural states: Secondary | ICD-10-CM

## 2016-03-23 LAB — POCT INR: INR: 2.9

## 2016-03-31 ENCOUNTER — Other Ambulatory Visit: Payer: PPO

## 2016-03-31 ENCOUNTER — Telehealth: Payer: Self-pay | Admitting: Cardiovascular Disease

## 2016-03-31 ENCOUNTER — Telehealth: Payer: Self-pay | Admitting: *Deleted

## 2016-03-31 ENCOUNTER — Other Ambulatory Visit (INDEPENDENT_AMBULATORY_CARE_PROVIDER_SITE_OTHER): Payer: PPO

## 2016-03-31 DIAGNOSIS — E785 Hyperlipidemia, unspecified: Secondary | ICD-10-CM

## 2016-03-31 DIAGNOSIS — I2510 Atherosclerotic heart disease of native coronary artery without angina pectoris: Secondary | ICD-10-CM | POA: Diagnosis not present

## 2016-03-31 LAB — HEPATIC FUNCTION PANEL
ALBUMIN: 4 g/dL (ref 3.5–5.2)
ALT: 14 U/L (ref 0–53)
AST: 24 U/L (ref 0–37)
Alkaline Phosphatase: 56 U/L (ref 39–117)
BILIRUBIN DIRECT: 0.1 mg/dL (ref 0.0–0.3)
TOTAL PROTEIN: 7.8 g/dL (ref 6.0–8.3)
Total Bilirubin: 0.5 mg/dL (ref 0.2–1.2)

## 2016-03-31 LAB — LIPID PANEL
CHOL/HDL RATIO: 3
Cholesterol: 141 mg/dL (ref 0–200)
HDL: 49.5 mg/dL (ref >39.00)
LDL CALC: 74 mg/dL (ref 0–99)
NonHDL: 91.4
TRIGLYCERIDES: 87 mg/dL (ref 0.0–149.0)
VLDL: 17.4 mg/dL (ref 0.0–40.0)

## 2016-03-31 NOTE — Telephone Encounter (Signed)
New Message:    Pt is scheduled for lab work here today. Pt would like to know if he can go to [facility] to have his lab work. He says this is much closer to his house.

## 2016-03-31 NOTE — Telephone Encounter (Signed)
DPR ok to lmom. Lmom lab work looks good. Per [place] cholesterol #'s within acceptable limits. If any questions call back [phone].

## 2016-03-31 NOTE — Telephone Encounter (Signed)
Spoke with representative from [facility] and they said they were able to draw the labs as long as the order is in EPIC.  Called pt and advised him ok to have labs drawn at [facility].  Pt appreciative for assistance.

## 2016-03-31 NOTE — Addendum Note (Signed)
Addended by: Daralene Milch C on: 03/31/2016 10:20 AM   Modules accepted: Orders

## 2016-04-05 ENCOUNTER — Other Ambulatory Visit: Payer: Self-pay | Admitting: Family Medicine

## 2016-04-06 ENCOUNTER — Ambulatory Visit (INDEPENDENT_AMBULATORY_CARE_PROVIDER_SITE_OTHER): Payer: PPO | Admitting: *Deleted

## 2016-04-06 DIAGNOSIS — Z8679 Personal history of other diseases of the circulatory system: Secondary | ICD-10-CM | POA: Diagnosis not present

## 2016-04-06 DIAGNOSIS — Z953 Presence of xenogenic heart valve: Secondary | ICD-10-CM

## 2016-04-06 DIAGNOSIS — Z9889 Other specified postprocedural states: Secondary | ICD-10-CM | POA: Diagnosis not present

## 2016-04-06 DIAGNOSIS — Z5181 Encounter for therapeutic drug level monitoring: Secondary | ICD-10-CM | POA: Diagnosis not present

## 2016-04-06 DIAGNOSIS — I480 Paroxysmal atrial fibrillation: Secondary | ICD-10-CM

## 2016-04-06 LAB — POCT INR: INR: 3.2

## 2016-04-26 ENCOUNTER — Ambulatory Visit (INDEPENDENT_AMBULATORY_CARE_PROVIDER_SITE_OTHER): Payer: PPO | Admitting: *Deleted

## 2016-04-26 DIAGNOSIS — L570 Actinic keratosis: Secondary | ICD-10-CM | POA: Diagnosis not present

## 2016-04-26 DIAGNOSIS — Z8679 Personal history of other diseases of the circulatory system: Secondary | ICD-10-CM

## 2016-04-26 DIAGNOSIS — Z9889 Other specified postprocedural states: Secondary | ICD-10-CM | POA: Diagnosis not present

## 2016-04-26 DIAGNOSIS — Z953 Presence of xenogenic heart valve: Secondary | ICD-10-CM

## 2016-04-26 DIAGNOSIS — Z5181 Encounter for therapeutic drug level monitoring: Secondary | ICD-10-CM | POA: Diagnosis not present

## 2016-04-26 DIAGNOSIS — H61009 Unspecified perichondritis of external ear, unspecified ear: Secondary | ICD-10-CM | POA: Diagnosis not present

## 2016-04-26 LAB — POCT INR: INR: 2.4

## 2016-04-26 MED ORDER — WARFARIN SODIUM 2.5 MG PO TABS: 2.5000 mg | ORAL_TABLET | Freq: Every day | ORAL | 2 refills | Status: DC

## 2016-05-01 ENCOUNTER — Encounter: Payer: PPO | Admitting: Thoracic Surgery (Cardiothoracic Vascular Surgery)

## 2016-05-08 ENCOUNTER — Encounter: Payer: Self-pay | Admitting: Thoracic Surgery (Cardiothoracic Vascular Surgery)

## 2016-05-08 ENCOUNTER — Ambulatory Visit (INDEPENDENT_AMBULATORY_CARE_PROVIDER_SITE_OTHER): Payer: PPO | Admitting: Thoracic Surgery (Cardiothoracic Vascular Surgery)

## 2016-05-08 ENCOUNTER — Other Ambulatory Visit: Payer: Self-pay | Admitting: *Deleted

## 2016-05-08 VITALS — BP 151/82 | HR 96 | Resp 16 | Ht 74.0 in | Wt 238.0 lb

## 2016-05-08 DIAGNOSIS — Z9889 Other specified postprocedural states: Secondary | ICD-10-CM

## 2016-05-08 DIAGNOSIS — Z8679 Personal history of other diseases of the circulatory system: Secondary | ICD-10-CM | POA: Diagnosis not present

## 2016-05-08 DIAGNOSIS — Z953 Presence of xenogenic heart valve: Secondary | ICD-10-CM | POA: Diagnosis not present

## 2016-05-08 DIAGNOSIS — Z952 Presence of prosthetic heart valve: Secondary | ICD-10-CM | POA: Diagnosis not present

## 2016-05-08 MED ORDER — METFORMIN HCL 500 MG PO TABS: 500.0000 mg | ORAL_TABLET | Freq: Two times a day (BID) | ORAL | 1 refills | Status: DC

## 2016-05-08 NOTE — Progress Notes (Signed)
[address]       [place] [address]             [phone]     CARDIOTHORACIC SURGERY OFFICE NOTE  Referring Provider is [name], MD PCP is [name], MD   HPI:  Patient is a 75 year old male with history of aortic stenosis, chronic persistent atrial fibrillation, chronic diastolic congestive heart failure, hypertension, type 2 diabetes mellitus with complications, stage III chronic kidney disease, and somewhat limited physical mobility with chronic tremor who returns to the office for routine follow-up approximately 3 months status post aortic valve replacement using a bioprosthetic tissue valve, Maze procedure, and closure of patent foramen ovale on 01/27/2016. His early postoperative recovery was uncomplicated and follow-up echocardiogram performed 01/28/2016 revealed a normal functioning bioprosthetic tissue valve in the aortic position with normal left ventricular systolic function, ejection fraction estimated 65-70%. He was last seen here in follow-up in our office on 03/06/2016. He underwent a 48 hour Holter monitor and was last seen in follow-up by Dr. Lovena Le on 03/09/2016.  Holter monitor was interpreted as probable normal sinus rhythm or sinus bradycardia with marked first-degree AV block.  EKG performed 03/09/2016 was interpreted as atrial fibrillation. No medication changes were recommended at that time and the patient remains on Tikosyn which was started prior to the patient's surgery. The patient is currently anticoagulated using warfarin. He returns to our office today for follow-up. He reports that he feels well. He states that he gets short of breath with only with more strenuous physical exertion.  He is not very active physically so he does not experience shortness of breath often at all. He states that his breathing is perhaps a little bit better than it was prior to surgery but overall he has not experienced a dramatic change. He has not  enrolled in the outpatient cardiac rehabilitation program but he is interested in doing so. He denies any palpitations. He has not had any dizzy spells. He has no chest discomfort.   Current Outpatient Prescriptions  Medication Sig Dispense Refill  . acetaminophen (TYLENOL) 325 MG tablet Take 650 mg by mouth every 4 (four) hours as needed for mild pain or fever.    Marland Kitchen aspirin EC 81 MG EC tablet Take 1 tablet (81 mg total) by mouth daily.    . cholecalciferol (VITAMIN D) 1000 UNITS tablet Take 1,000 Units by mouth every morning.     . dofetilide (TIKOSYN) 250 MCG capsule Take 1 capsule (250 mcg total) by mouth 2 (two) times daily. 60 capsule 8  . glipiZIDE (GLUCOTROL) 10 MG tablet Take 1 tablet by mouth daily with breakfast and 1/2 tablet daily with dinner. 135 tablet 1  . glucose blood (ONE TOUCH ULTRA TEST) test strip Use to check sugar once daily and as needed. Dx:E11.319 **One Touch Ultra** 100 each 2  . Magnesium Oxide 250 MG TABS Take 1 tablet by mouth daily as needed (supplement).     . metFORMIN (GLUCOPHAGE) 500 MG tablet Take 1 tablet (500 mg total) by mouth 2 (two) times daily with a meal. 60 tablet 3  . Multiple Vitamin (MULTIVITAMIN WITH MINERALS) TABS tablet Take by mouth daily. 1 packet of 5 vitamins daily    . multivitamin-lutein (OCUVITE-LUTEIN) CAPS capsule Take 3 capsules by mouth daily.    Marland Kitchen oxybutynin (DITROPAN) 5 MG tablet Take 1 tablet (5 mg total) by mouth 3 (three) times daily. 270 tablet 3  . pravastatin (PRAVACHOL) 20 MG  tablet Take 1 tablet (20 mg total) by mouth every evening. 90 tablet 3  . vitamin B-12 (CYANOCOBALAMIN) 1000 MCG tablet Take 1,000 mcg by mouth daily.    Marland Kitchen warfarin (COUMADIN) 2.5 MG tablet Take 1 tablet (2.5 mg total) by mouth daily at 6 PM. Or as directed. 35 tablet 2   No current facility-administered medications for this visit.       Physical Exam:   BP (!) 151/82 (BP Location: Right Arm, Patient Position: Sitting, Cuff Size: Large)   Pulse 96    Resp 16   Ht 6\' 2"  (1.88 m)   Wt 238 lb (108 kg)   SpO2 97% Comment: ON RA  BMI 30.56 kg/m   General:  Well-appearing with continuous tremor  Chest:   Clear to auscultation  CV:   Regular rate and rhythm  Incisions:  Completely healed, sternum is stable  Abdomen:  Soft and nontender  Extremities:  Warm and well-perfused  Diagnostic Tests:  Transthoracic Echocardiography  Patient:    Bill [name], Bill [name] MR #:       [id number] Study Date: 02/07/2016 Gender:     M Age:        62 Height:     188 cm Weight:     117.8 kg BSA:        2.51 m^2 Pt. Status: Room:   ORDERING     [name], MD  REFERRING    [name], MD  SONOGRAPHER  Cindy Hazy, RDCS  ATTENDING    Ena Dawley, M.D.  PERFORMING   [facility], Outpatient  cc:  ------------------------------------------------------------------- LV EF: 65% -   70%  ------------------------------------------------------------------- Indications:      R06.00 Dyspnea.  I35.9 Aortic Valve Disorder. ECHO WITH DEFINITY.  ------------------------------------------------------------------- History:   PMH:  Acquired from the patient and from the patient&'s chart.  PMH:  Atrial Fibrillation. CHF. CAD. Murmur. Shortness of Breath. Neuromuscular Disorder. Lyme Disease.  Risk factors: Exogenous Obesity. Hypertension. Diabetes mellitus.  ------------------------------------------------------------------- Study Conclusions  - Left ventricle: The cavity size was normal. There was moderate   concentric hypertrophy. Systolic function was vigorous. The   estimated ejection fraction was in the range of 65% to 70%. Wall   motion was normal; there were no regional wall motion   abnormalities. - Aortic valve: A 27 mm [facility] Ease Bovine pericardial   bioprosthetic valve sits well in the aortic position. There is no   abnormal rocking motion. No aortic regurgitation or paravalvular   leak. Normal transaortic gradients.  Mean gradient (S): 9 mm Hg.   Peak gradient (S): 14 mm Hg. - Aortic root: The aortic root was normal in size. - Ascending aorta: The ascending aorta was normal in size. - Mitral valve: Calcified annulus. Mildly thickened leaflets .   There was trivial regurgitation. - Left atrium: The atrium was moderately dilated. - Right ventricle: The cavity size was normal. Wall thickness was   normal. Systolic function was normal. - Right atrium: The atrium was normal in size. - Pulmonary arteries: Systolic pressure was within the normal   range. - Inferior vena cava: The vessel was normal in size. - Pericardium, extracardiac: A trivial pericardial effusion was   identified posterior to the heart. Features were not consistent   with tamponade physiology.  Impressions:  - Since the last study on 11/05/2015 there is now a new 27 mm   [facility] Ease Bovine pericardial bioprosthetic valve in the   aortic position. There is no abnormal rocking motion.  No aortic   regurgitation or paravalvular leak. Normal transaortic gradients.  ------------------------------------------------------------------- Labs, prior tests, procedures, and surgery: Status post Aortic Valve Replacement-60mm [employer] Ease Bovine Pericardial Tissue Valve. Status post Maze procedure.  ------------------------------------------------------------------- Study data:   Study status:  Routine.  Procedure:  The patient reported no pain pre or post test. Transthoracic echocardiography for left ventricular function evaluation, for right ventricular function evaluation, and for assessment of valvular function. Image quality was suboptimal. Intravenous contrast (Definity) was administered to enhance regional wall motion assessment and opacify the LV.  Study completion:  There were no complications. Transthoracic echocardiography.  M-mode, complete 2D, spectral Doppler, and color Doppler.  Birthdate:  Patient  birthdate: [birth year].  Age:  Patient is 75 yr old.  Sex:  Gender: male. BMI: 33.3 kg/m^2.  Blood pressure:     124/70  Patient status: Outpatient.  Study date:  Study date: 02/07/2016. Study time: 01:12 PM.  Location:  [facility]  -------------------------------------------------------------------  ------------------------------------------------------------------- Left ventricle:  The cavity size was normal. There was moderate concentric hypertrophy. Systolic function was vigorous. The estimated ejection fraction was in the range of 65% to 70%. Wall motion was normal; there were no regional wall motion abnormalities. The study was not technically sufficient to allow evaluation of LV diastolic dysfunction due to atrial fibrillation.   ------------------------------------------------------------------- Aortic valve:  A 27 mm [facility] Ease Bovine pericardial bioprosthetic valve sits well in the aortic position. There is no abnormal rocking motion. No aortic regurgitation or paravalvular leak. Normal transaortic gradients. Mobility was not restricted. Doppler:  Transvalvular velocity was within the normal range. There was no stenosis. There was no regurgitation.    VTI ratio of LVOT to aortic valve: 0.43. Peak velocity ratio of LVOT to aortic valve: 0.38. Mean velocity ratio of LVOT to aortic valve: 0.38.    Mean gradient (S): 9 mm Hg. Peak gradient (S): 14 mm Hg.  ------------------------------------------------------------------- Aorta:  Aortic root: The aortic root was normal in size. Ascending aorta: The ascending aorta was normal in size.  ------------------------------------------------------------------- Mitral valve:   Calcified annulus. Mildly thickened leaflets . Mobility was not restricted.  Doppler:  Transvalvular velocity was within the normal range. There was no evidence for stenosis. There was trivial regurgitation.    Peak gradient (D): 9 mm  Hg.  ------------------------------------------------------------------- Left atrium:  The atrium was moderately dilated.  ------------------------------------------------------------------- Right ventricle:  The cavity size was normal. Wall thickness was normal. Systolic function was normal.  ------------------------------------------------------------------- Pulmonic valve:    Structurally normal valve.   Cusp separation was normal.  Doppler:  Transvalvular velocity was within the normal range. There was no evidence for stenosis. There was no regurgitation.  ------------------------------------------------------------------- Tricuspid valve:   Structurally normal valve.    Doppler: Transvalvular velocity was within the normal range. There was no regurgitation.  ------------------------------------------------------------------- Pulmonary artery:   The main pulmonary artery was normal-sized. Systolic pressure was within the normal range.  ------------------------------------------------------------------- Right atrium:  The atrium was normal in size.  ------------------------------------------------------------------- Pericardium:  A trivial pericardial effusion was identified posterior to the heart.  Doppler:  Features were not consistent with tamponade physiology.  ------------------------------------------------------------------- Systemic veins: Inferior vena cava: The vessel was normal in size.  ------------------------------------------------------------------- Measurements   Left ventricle                         Value        Reference  LV ID, ED, PLAX chordal  44.4  mm     43 - 52  LV ID, ES, PLAX chordal                30.4  mm     23 - 38  LV fx shortening, PLAX chordal         32    %      >=29  LV PW thickness, ED                    13.1  mm     ---------  IVS/LV PW ratio, ED                    1.11         <=1.3    Ventricular septum                      Value        Reference  IVS thickness, ED                      14.6  mm     ---------    LVOT                                   Value        Reference  LVOT peak velocity, S                  71    cm/s   ---------  LVOT mean velocity, S                  50.4  cm/s   ---------  LVOT VTI, S                            14.6  cm     ---------  LVOT peak gradient, S                  2     mm Hg  ---------    Aortic valve                           Value        Reference  Aortic valve peak velocity, S          188   cm/s   ---------  Aortic valve mean velocity, S          134   cm/s   ---------  Aortic valve VTI, S                    34.3  cm     ---------  Aortic mean gradient, S                8     mm Hg  ---------  Aortic peak gradient, S                14    mm Hg  ---------  VTI ratio, LVOT/AV                     0.43         ---------  Velocity ratio, peak, LVOT/AV          0.38         ---------  Velocity ratio, mean, LVOT/AV          0.38         ---------    Aorta                                  Value        Reference  Aortic root ID, ED                     35    mm     ---------  Ascending aorta ID, A-P, S             33    mm     ---------    Left atrium                            Value        Reference  LA ID, A-P, ES                         51    mm     ---------  LA ID/bsa, A-P                         2.03  cm/m^2 <=2.2  LA volume, S                           75    ml     ---------  LA volume/bsa, S                       29.8  ml/m^2 ---------  LA volume, ES, 1-p A4C                 109   ml     ---------  LA volume/bsa, ES, 1-p A4C             43.4  ml/m^2 ---------  LA volume, ES, 1-p A2C                 42    ml     ---------  LA volume/bsa, ES, 1-p A2C             16.7  ml/m^2 ---------    Mitral valve                           Value        Reference  Mitral E-wave peak velocity            147   cm/s   ---------  Mitral deceleration time                158   ms     150 - 230  Mitral peak gradient, D                9     mm Hg  ---------    Right ventricle                        Value        Reference  RV s&', lateral, S  7.94  cm/s   ---------  Legend: (L)  and  (H)  mark values outside specified reference range.  ------------------------------------------------------------------- Prepared and Electronically Authenticated by  Sanda Klein, MD 2017-09-18T16:05:40   2 channel telemetry rhythm strip demonstrates regular, narrow complex rhythm. There is severe baseline noise related to the patient's tremor making it impossible to discern the presence or absence of P waves or flutter waves.   Impression:  Patient is doing well approximately 3 months status post aortic valve replacement using a bioprosthetic tissue valve and Maze procedure. He describes stable symptoms of exertional shortness of breath and fatigue that occur only with more strenuous physical exertion, consistent with chronic diastolic congestive heart failure [place] Heart Association functional class II. He states that his breathing seems to be slightly improved in comparison with prior to surgery. His exercise tolerance remains limited, primarily because of the patient's chronic tremor and somewhat limited physical mobility. He might benefit from participation in the outpatient cardiac rehabilitation program. He denies any symptoms of palpitations, chest pain, or dizziness. He appears to remain in a regular, narrow complex rhythm consistent with sinus rhythm or ectopic atrial rhythm.      Plan:  We have not recommended any changes to the patient's current medications. The patient has asked about whether he should remain on Tikosyn, aspirin, and a statin.  Because of the presence of nonobstructive coronary artery disease it may be reasonable to continue the patient on low-dose aspirin and a statin, but we will defer this decision to Dr. [name]. It  might be reasonable to consider stopping Tikosyn in the near future.  His CHADS-VASC score is at least 4.  In the absence of bleeding complications he probably should remain on long-term anticoagulation indefinitely, although his left atrial appendage was clipped at the time of surgery.  The patient has been reminded regarding the importance of dental hygiene and the lifelong need for antibiotic prophylaxis for all dental cleanings and other related invasive procedures.  He will continue to follow-up with Dr. Lovena Le and Roderic Palau in the atrial fibrillation clinic for long-term surveillance following maze procedure. He has a routine follow-up appointment in the near future with Dr. [name]. He will return to our office for routine follow-up next September, approximately 1 year following his original surgery. He will call and return sooner should specific problems or questions arise.  I spent in excess of 15 minutes during the conduct of this office consultation and >50% of this time involved direct face-to-face encounter with the patient for counseling and/or coordination of their care.    Valentina Gu. Roxy Manns, MD 05/08/2016 11:51 AM

## 2016-05-08 NOTE — Patient Instructions (Signed)

## 2016-05-24 ENCOUNTER — Ambulatory Visit (INDEPENDENT_AMBULATORY_CARE_PROVIDER_SITE_OTHER): Payer: PPO | Admitting: Pharmacist

## 2016-05-24 DIAGNOSIS — Z953 Presence of xenogenic heart valve: Secondary | ICD-10-CM

## 2016-05-24 DIAGNOSIS — Z9889 Other specified postprocedural states: Secondary | ICD-10-CM

## 2016-05-24 DIAGNOSIS — Z5181 Encounter for therapeutic drug level monitoring: Secondary | ICD-10-CM | POA: Diagnosis not present

## 2016-05-24 DIAGNOSIS — Z8679 Personal history of other diseases of the circulatory system: Secondary | ICD-10-CM

## 2016-05-24 LAB — POCT INR: INR: 2.9

## 2016-06-20 ENCOUNTER — Ambulatory Visit (INDEPENDENT_AMBULATORY_CARE_PROVIDER_SITE_OTHER): Payer: PPO | Admitting: Primary Care

## 2016-06-20 VITALS — BP 142/62 | HR 88 | Wt 226.0 lb

## 2016-06-20 DIAGNOSIS — M62838 Other muscle spasm: Secondary | ICD-10-CM

## 2016-06-20 MED ORDER — TIZANIDINE HCL 2 MG PO TABS: ORAL_TABLET | ORAL | 0 refills | Status: DC

## 2016-06-20 NOTE — Patient Instructions (Signed)
Take Tizanidine 2 mg tablets for muscle spasms. Take 1 to 2 tablets by mouth every 8 hours as needed for neck stiffness. Caution as this may cause drowsiness.  Apply a heating pad three times daily for 30 minute intervals. Attempt to stretch your neck if possible.  Please call us if no improvement in 3-4 days.  It was a pleasure meeting you!

## 2016-06-20 NOTE — Progress Notes (Signed)
Subjective:    Patient ID: Bill [name], male    DOB: 29-Sep-1940, 76 y.o.   MRN: [id number]  HPI  Bill [name] is a 76 year old male with a history of chronic low back pain and degenerative arthritis of the hip who presents today with a chief complaint of neck and shoulder pain. His pain is located to the posterior neck from C3-4 through 6-7. His pain began Wednesday during the day. He denies recent injury/trauma. He's taken oxycodone and tylenol without improvement. He's used a heating pad without improvement. He denies fevers, cough, weakness, fatigue, numbness/tingling, His pain is worse with movement.   Review of Systems  Constitutional: Negative for fatigue and fever.  Respiratory: Negative for cough.   Musculoskeletal: Positive for myalgias, neck pain and neck stiffness.  Neurological: Negative for numbness.       Past Medical History:  Diagnosis Date  . Aortic stenosis 12/2010   s/p bioprosthetic AVR 9/17 // Echo 6/17: EF 55-60, mean AV 25 mmHg // TEE 6/17: severe AS, AVA 0.93, mean AV 43 mmHg // post op Echo 02/07/16: mod conc LVH, EF 65-70, vigorous LVF, AVR ok, mean 9 mmHg, MAC, trivial MR, mod LAE, trivial pericardial eff  . Chronic diastolic CHF (congestive heart failure) ([place])   . Coronary artery disease    LHC 8/17: pLAD 20, mLAD 30, dLAD 20, mRCA 30, dRCA 20, RPDA 20  . Exogenous obesity   . GERD (gastroesophageal reflux disease)    occasional  . History of chicken pox   . Hypertension   . Kidney stones remote  . Lyme disease 2012   ?titers negative  . Neuromuscular disorder (HCC)    tremor right arm  . Persistent atrial fibrillation (HCC)    Tikosyn // s/p Maze procedure 9/17 // Anticoagulation undesirable 2/2 hx of retinal hemorrhage (worked up for [employer] but referred for Maze at time of AVR 2/2 aortic stenosis)   . PFO (patent foramen ovale)    s/p PFO closure at time of AVR in 9/17  . S/P aortic valve replacement with bioprosthetic valve 01/27/2016   27 mm  [facility] Ease bovine pericardial tissue valve  . S/P Maze operation for atrial fibrillation 01/27/2016   Complete bilateral atrial lesion set using bipolar radiofrequency and cryothermy ablation with clipping of LA appendage  . Type 2 diabetes, controlled, with retinopathy ([place]) 1980s  . Urge incontinence   . Vitamin B12 deficiency    IF normal (2014)     Social History   Social History  . Marital status: Married    Spouse name: N/A  . Number of children: N/A  . Years of education: N/A   Occupational History  . Not on file.   Social History Main Topics  . Smoking status: Never Smoker  . Smokeless tobacco: Never Used  . Alcohol use Yes     Comment: Regular (bourbon and coke 2-3/day)  . Drug use: No  . Sexual activity: No   Other Topics Concern  . Not on file   Social History Narrative   Caffeine: coffee 1 cup/day   Lives with wife [name]), 1 cat   Occupation: retired, worked for [employer]   Edu: BS   Activity: no regular exercise   Diet: some water, fruits/vegetables daily      Advanced directives: has living will at home. Does not want prolonged life support "no extraordinary measures" but would accept temporary measures. Would want wife to be HCPOA.     Past  Surgical History:  Procedure Laterality Date  . AORTIC VALVE REPLACEMENT N/A 01/27/2016   Procedure: AORTIC VALVE REPLACEMENT (AVR);  Surgeon: Rexene Alberts, MD;  Location: [place];  Service: Open Heart Surgery;  Laterality: N/A;  . BACK SURGERY  20 yrs ago   removal of lower back cartilage due to damage. Patient does not remember date of surgery.  Marland Kitchen CARDIAC CATHETERIZATION N/A 01/05/2016   Procedure: Right/Left Heart Cath and Coronary Angiography;  Surgeon: Burnell Blanks, MD;  Location: [place] CV LAB;  Service: Cardiovascular;  Laterality: N/A;  . CARDIOVERSION N/A 02/25/2015   Procedure: CARDIOVERSION;  Surgeon: Skeet Latch, MD;  Location: [facility] ENDOSCOPY;  Service: Cardiovascular;  Laterality: N/A;   . CATARACT EXTRACTION Bilateral 2015   [facility]  . COLONOSCOPY    . EYE SURGERY     left eye "cleaned"  . finger sx    . kidney stone removal    . MAZE N/A 01/27/2016   Procedure: MAZE;  Surgeon: Rexene Alberts, MD;  Location: [place];  Service: Open Heart Surgery;  Laterality: N/A;  . TEE WITHOUT CARDIOVERSION N/A 11/05/2015   Procedure: TRANSESOPHAGEAL ECHOCARDIOGRAM (TEE);  Surgeon: Pixie Casino, MD;  Location: [place];  Service: Cardiovascular;  Laterality: N/A;  . TEE WITHOUT CARDIOVERSION N/A 01/27/2016   Procedure: TRANSESOPHAGEAL ECHOCARDIOGRAM (TEE);  Surgeon: Rexene Alberts, MD;  Location: [place];  Service: Open Heart Surgery;  Laterality: N/A;  . TOTAL HIP ARTHROPLASTY  03/12/2012   RIGHT TOTAL HIP ARTHROPLASTY ANTERIOR APPROACH;  Surgeon: Mcarthur Rossetti, MD;  . US ECHOCARDIOGRAPHY  12/2010   EF 50-55%, mild LVH, normal wall motion, high LV filling pressures, mild AS, LA mildly dilated    Family History  Problem Relation Age of Onset  . Diabetes Mother   . Heart disease Mother   . Diabetes Father   . Heart disease Father   . Cancer Brother     throat    Allergies  Allergen Reactions  . No Known Allergies     Current Outpatient Prescriptions on File Prior to Visit  Medication Sig Dispense Refill  . acetaminophen (TYLENOL) 325 MG tablet Take 650 mg by mouth every 4 (four) hours as needed for mild pain or fever.    Marland Kitchen aspirin EC 81 MG EC tablet Take 1 tablet (81 mg total) by mouth daily.    . cholecalciferol (VITAMIN D) 1000 UNITS tablet Take 1,000 Units by mouth every morning.     . dofetilide (TIKOSYN) 250 MCG capsule Take 1 capsule (250 mcg total) by mouth 2 (two) times daily. 60 capsule 8  . glipiZIDE (GLUCOTROL) 10 MG tablet Take 1 tablet by mouth daily with breakfast and 1/2 tablet daily with dinner. 135 tablet 1  . glucose blood (ONE TOUCH ULTRA TEST) test strip Use to check sugar once daily and as needed. Dx:E11.319 **One Touch Ultra**  100 each 2  . Magnesium Oxide 250 MG TABS Take 1 tablet by mouth daily as needed (supplement).     . metFORMIN (GLUCOPHAGE) 500 MG tablet Take 1 tablet (500 mg total) by mouth 2 (two) times daily with a meal. 180 tablet 1  . Multiple Vitamin (MULTIVITAMIN WITH MINERALS) TABS tablet Take by mouth daily. 1 packet of 5 vitamins daily    . multivitamin-lutein (OCUVITE-LUTEIN) CAPS capsule Take 3 capsules by mouth daily.    Marland Kitchen oxybutynin (DITROPAN) 5 MG tablet Take 1 tablet (5 mg total) by mouth 3 (three) times daily. 270 tablet 3  .  pravastatin (PRAVACHOL) 20 MG tablet Take 1 tablet (20 mg total) by mouth every evening. 90 tablet 3  . vitamin B-12 (CYANOCOBALAMIN) 1000 MCG tablet Take 1,000 mcg by mouth daily.    Marland Kitchen warfarin (COUMADIN) 2.5 MG tablet Take 1 tablet (2.5 mg total) by mouth daily at 6 PM. Or as directed. 35 tablet 2   No current facility-administered medications on file prior to visit.     BP (!) 142/62 (BP Location: Right Arm, Patient Position: Sitting)   Pulse 88   Wt 226 lb (102.5 kg)   BMI 29.02 kg/m    Objective:   Physical Exam  Constitutional: He is oriented to person, place, and time. He appears well-nourished.  Neck: Neck supple. Muscular tenderness present. No spinous process tenderness present. Decreased range of motion present.  Moderate decrease in ROM in all planes. Muscle tension noted to trapezius muscles of neck and upper shoulders.  Cardiovascular: Normal rate.   Pulmonary/Chest: Effort normal and breath sounds normal.  Neurological: He is alert and oriented to person, place, and time.  Skin: Skin is warm and dry.          Assessment & Plan:  Muscle Spasm:  Located to midline of neck and upper shoulders bilaterally. Exam today with moderate decrease in ROM, no radiculopathy. Obvious muscle tension to site of concern. No spinous process tenderness. Do not suspect bacterial or viral causes. Suspect muscle spasm. Rx for Tizanidine sent to pharmacy.  Discussed application of heating pad and to attempt stretching exercises.  Discussed healing process and to allow for 1-2 weeks for complete resolve. Follow up PRN.  Sheral Flow, NP

## 2016-06-23 ENCOUNTER — Ambulatory Visit: Payer: Self-pay | Admitting: Pharmacist

## 2016-06-23 DIAGNOSIS — Z9889 Other specified postprocedural states: Secondary | ICD-10-CM

## 2016-06-23 DIAGNOSIS — Z8679 Personal history of other diseases of the circulatory system: Secondary | ICD-10-CM

## 2016-06-23 DIAGNOSIS — Z953 Presence of xenogenic heart valve: Secondary | ICD-10-CM

## 2016-06-23 DIAGNOSIS — Z5181 Encounter for therapeutic drug level monitoring: Secondary | ICD-10-CM

## 2016-06-23 NOTE — Progress Notes (Signed)
This encounter was created in error - please disregard.

## 2016-06-23 NOTE — Addendum Note (Signed)
Addended by: Filbert Craze E on: 06/23/2016 11:15 AM   Modules accepted: Level of Service, SmartSet

## 2016-06-28 ENCOUNTER — Encounter: Payer: Self-pay | Admitting: Cardiovascular Disease

## 2016-06-28 ENCOUNTER — Ambulatory Visit (INDEPENDENT_AMBULATORY_CARE_PROVIDER_SITE_OTHER): Payer: PPO | Admitting: Cardiovascular Disease

## 2016-06-28 VITALS — BP 130/70 | HR 68 | Ht 74.0 in | Wt 240.0 lb

## 2016-06-28 DIAGNOSIS — Z9889 Other specified postprocedural states: Secondary | ICD-10-CM

## 2016-06-28 DIAGNOSIS — I10 Essential (primary) hypertension: Secondary | ICD-10-CM | POA: Diagnosis not present

## 2016-06-28 DIAGNOSIS — Z953 Presence of xenogenic heart valve: Secondary | ICD-10-CM

## 2016-06-28 DIAGNOSIS — Z8679 Personal history of other diseases of the circulatory system: Secondary | ICD-10-CM

## 2016-06-28 DIAGNOSIS — I5032 Chronic diastolic (congestive) heart failure: Secondary | ICD-10-CM | POA: Diagnosis not present

## 2016-06-28 NOTE — Patient Instructions (Addendum)
Medication Instructions:  Your physician has recommended you make the following change in your medication:  1. STOP Aspirin  Labwork: No new orders.   Testing/Procedures: No new orders.   Follow-Up: Your physician recommends that you schedule a follow-up appointment in: MARCH with Dr Lovena Le (EKG will be performed at that appointment)  Your physician wants you to follow-up in: September/October 2018 with Dr [name].  You will receive a reminder letter in the mail two months in advance. If you don't receive a letter, please call our office to schedule the follow-up appointment.  Any Other Special Instructions Will Be Listed Below (If Applicable).  You have been referred to Cardiac Rehabilitation. They should contact you by phone in regards to this program.     If you need a refill on your cardiac medications before your next appointment, please call your pharmacy.

## 2016-06-28 NOTE — Progress Notes (Signed)
Cardiology Office Note Date:  06/28/2016   ID:  Bill [name], DOB April 24, 1941, MRN [id number]  PCP:  [name], MD  Cardiologist:  [name], MD    Chief Complaint  Patient presents with  . aortic valve disease     History of Present Illness: Bill [name] is a 76 y.o. male who presents for follow-up of aortic valve disease, atrial fibrillation, and CAD.   He underwent bioprosthetic aortic valve replacement, Maze procedure, and clipping of the left atrial appendage in September 2017. He has had atrial fibrillation postoperatively and he has managed with dofetilide. He has been on ASA and warfarin.   The patient is here with his wife he denies symptoms of shortness of breath, chest pain, or edema. He has not been very active. States that he never heard from cardiac rehabilitation but would like to participate. He denies any bleeding problems.  Past Medical History:  Diagnosis Date  . Aortic stenosis 12/2010   s/p bioprosthetic AVR 9/17 // Echo 6/17: EF 55-60, mean AV 25 mmHg // TEE 6/17: severe AS, AVA 0.93, mean AV 43 mmHg // post op Echo 02/07/16: mod conc LVH, EF 65-70, vigorous LVF, AVR ok, mean 9 mmHg, MAC, trivial MR, mod LAE, trivial pericardial eff  . Chronic diastolic CHF (congestive heart failure) ([place])   . Coronary artery disease    LHC 8/17: pLAD 20, mLAD 30, dLAD 20, mRCA 30, dRCA 20, RPDA 20  . Exogenous obesity   . GERD (gastroesophageal reflux disease)    occasional  . History of chicken pox   . Hypertension   . Kidney stones remote  . Lyme disease 2012   ?titers negative  . Neuromuscular disorder (HCC)    tremor right arm  . Persistent atrial fibrillation (HCC)    Tikosyn // s/p Maze procedure 9/17 // Anticoagulation undesirable 2/2 hx of retinal hemorrhage (worked up for [facility] [facility] but referred for Maze at time of AVR 2/2 aortic stenosis)   . PFO (patent foramen ovale)    s/p PFO closure at time of AVR in 9/17  . S/P aortic valve replacement  with bioprosthetic valve 01/27/2016   27 mm [facility] Ease bovine pericardial tissue valve  . S/P Maze operation for atrial fibrillation 01/27/2016   Complete bilateral atrial lesion set using bipolar radiofrequency and cryothermy ablation with clipping of LA appendage  . Type 2 diabetes, controlled, with retinopathy ([place]) 1980s  . Urge incontinence   . Vitamin B12 deficiency    IF normal (2014)    Past Surgical History:  Procedure Laterality Date  . AORTIC VALVE REPLACEMENT N/A 01/27/2016   Procedure: AORTIC VALVE REPLACEMENT (AVR);  Surgeon: Rexene Alberts, MD;  Location: [place];  Service: Open Heart Surgery;  Laterality: N/A;  . BACK SURGERY  20 yrs ago   removal of lower back cartilage due to damage. Patient does not remember date of surgery.  Marland Kitchen CARDIAC CATHETERIZATION N/A 01/05/2016   Procedure: Right/Left Heart Cath and Coronary Angiography;  Surgeon: Burnell Blanks, MD;  Location: [place] CV LAB;  Service: Cardiovascular;  Laterality: N/A;  . CARDIOVERSION N/A 02/25/2015   Procedure: CARDIOVERSION;  Surgeon: Skeet Latch, MD;  Location: [facility] ENDOSCOPY;  Service: Cardiovascular;  Laterality: N/A;  . CATARACT EXTRACTION Bilateral 2015   [facility]  . COLONOSCOPY    . EYE SURGERY     left eye "cleaned"  . finger sx    . kidney stone removal    . MAZE  N/A 01/27/2016   Procedure: MAZE;  Surgeon: Rexene Alberts, MD;  Location: [place];  Service: Open Heart Surgery;  Laterality: N/A;  . TEE WITHOUT CARDIOVERSION N/A 11/05/2015   Procedure: TRANSESOPHAGEAL ECHOCARDIOGRAM (TEE);  Surgeon: Pixie Casino, MD;  Location: [place];  Service: Cardiovascular;  Laterality: N/A;  . TEE WITHOUT CARDIOVERSION N/A 01/27/2016   Procedure: TRANSESOPHAGEAL ECHOCARDIOGRAM (TEE);  Surgeon: Rexene Alberts, MD;  Location: [place];  Service: Open Heart Surgery;  Laterality: N/A;  . TOTAL HIP ARTHROPLASTY  03/12/2012   RIGHT TOTAL HIP ARTHROPLASTY ANTERIOR APPROACH;  Surgeon:  Mcarthur Rossetti, MD;  . US ECHOCARDIOGRAPHY  12/2010   EF 50-55%, mild LVH, normal wall motion, high LV filling pressures, mild AS, LA mildly dilated    Current Outpatient Prescriptions  Medication Sig Dispense Refill  . acetaminophen (TYLENOL) 325 MG tablet Take 650 mg by mouth every 4 (four) hours as needed for mild pain or fever.    Marland Kitchen aspirin EC 81 MG EC tablet Take 1 tablet (81 mg total) by mouth daily.    . cholecalciferol (VITAMIN D) 1000 UNITS tablet Take 1,000 Units by mouth every morning.     . dofetilide (TIKOSYN) 250 MCG capsule Take 1 capsule (250 mcg total) by mouth 2 (two) times daily. 60 capsule 8  . glipiZIDE (GLUCOTROL) 10 MG tablet Take 1 tablet by mouth daily with breakfast and 1/2 tablet daily with dinner. 135 tablet 1  . glucose blood (ONE TOUCH ULTRA TEST) test strip Use to check sugar once daily and as needed. Dx:E11.319 **One Touch Ultra** 100 each 2  . Magnesium Oxide 250 MG TABS Take 1 tablet by mouth daily as needed (supplement).     . metFORMIN (GLUCOPHAGE) 500 MG tablet Take 1 tablet (500 mg total) by mouth 2 (two) times daily with a meal. 180 tablet 1  . Multiple Vitamin (MULTIVITAMIN WITH MINERALS) TABS tablet Take by mouth daily. 1 packet of 5 vitamins daily    . multivitamin-lutein (OCUVITE-LUTEIN) CAPS capsule Take 3 capsules by mouth daily.    Marland Kitchen oxybutynin (DITROPAN) 5 MG tablet Take 1 tablet (5 mg total) by mouth 3 (three) times daily. 270 tablet 3  . tiZANidine (ZANAFLEX) 2 MG tablet Take 1 to 2 tablets by mouth every 8 hours as needed for muscle spasms. 30 tablet 0  . vitamin B-12 (CYANOCOBALAMIN) 1000 MCG tablet Take 1,000 mcg by mouth daily.    Marland Kitchen warfarin (COUMADIN) 2.5 MG tablet Take 1 tablet (2.5 mg total) by mouth daily at 6 PM. Or as directed. 35 tablet 2  . pravastatin (PRAVACHOL) 20 MG tablet Take 1 tablet (20 mg total) by mouth every evening. 90 tablet 3   No current facility-administered medications for this visit.     Allergies:   No  known allergies   Social History:  The patient  reports that he has never smoked. He has never used smokeless tobacco. He reports that he drinks alcohol. He reports that he does not use drugs.   Family History:  The patient's  family history includes Cancer in his brother; Diabetes in his father and mother; Heart disease in his father and mother.    ROS:  Please see the history of present illness.   All other systems are reviewed and negative.    PHYSICAL EXAM: VS:  BP 130/70   Pulse 68   Ht _0  (1.88 m)   Wt 240 lb (108.9 kg)   BMI 30.81 kg/m  , BMI  Body mass index is 30.81 kg/m. GEN: Well nourished, well developed, in no acute distress  HEENT: normal  Neck: no JVD, no masses. No carotid bruits Cardiac: irregular with 2/6 SEM at the RUSB      Respiratory:  clear to auscultation bilaterally, normal work of breathing GI: soft, nontender, nondistended, + BS MS: no deformity or atrophy  Ext: no pretibial edema, pedal pulses 2+= bilaterally Skin: warm and dry, no rash Neuro:  Strength and sensation are intact Psych: euthymic mood, full affect  EKG:  EKG is not ordered today.  Recent Labs: 01/31/2016: Hemoglobin 9.5; Platelets 134 02/16/2016: BUN 19; Creat 1.62; Magnesium 1.9; Potassium 4.5; Sodium 136 03/31/2016: ALT 14   Lipid Panel     Component Value Date/Time   CHOL 141 03/31/2016 1020   TRIG 87.0 03/31/2016 1020   TRIG 71 04/17/2006 1259   HDL 49.50 03/31/2016 1020   CHOLHDL 3 03/31/2016 1020   VLDL 17.4 03/31/2016 1020   LDLCALC 74 03/31/2016 1020   LDLDIRECT 124.4 12/31/2012 0739      Wt Readings from Last 3 Encounters:  06/28/16 240 lb (108.9 kg)  06/20/16 226 lb (102.5 kg)  05/08/16 238 lb (108 kg)     Cardiac Studies Reviewed: 2D Echo 02-07-2016: Study Conclusions  - Left ventricle: The cavity size was normal. There was moderate   concentric hypertrophy. Systolic function was vigorous. The   estimated ejection fraction was in the range of 65% to  70%. Wall   motion was normal; there were no regional wall motion   abnormalities. - Aortic valve: A 27 mm [facility] Ease Bovine pericardial   bioprosthetic valve sits well in the aortic position. There is no   abnormal rocking motion. No aortic regurgitation or paravalvular   leak. Normal transaortic gradients. Mean gradient (S): 9 mm Hg.   Peak gradient (S): 14 mm Hg. - Aortic root: The aortic root was normal in size. - Ascending aorta: The ascending aorta was normal in size. - Mitral valve: Calcified annulus. Mildly thickened leaflets .   There was trivial regurgitation. - Left atrium: The atrium was moderately dilated. - Right ventricle: The cavity size was normal. Wall thickness was   normal. Systolic function was normal. - Right atrium: The atrium was normal in size. - Pulmonary arteries: Systolic pressure was within the normal   range. - Inferior vena cava: The vessel was normal in size. - Pericardium, extracardiac: A trivial pericardial effusion was   identified posterior to the heart. Features were not consistent   with tamponade physiology.  Impressions:  - Since the last study on 11/05/2015 there is now a new 27 mm   [facility] Ease Bovine pericardial bioprosthetic valve in the   aortic position. There is no abnormal rocking motion. No aortic   regurgitation or paravalvular leak. Normal transaortic gradients.   ASSESSMENT AND PLAN: 1.  Chronic diastolic heart failure: The patient appears to be euvolemic on exam. He is not limited at his current activity level and likely has NYHA functional class II symptoms. Medications are reviewed and will be continued without change.  2. Persistent atrial fibrillation: The patient is status post maze procedure. He returns to see Dr. Lovena Le next month with a follow-up visit and EKG at that time. A decision will be made whether to continue dofetilide. He remains on warfarin. He's had no further bleeding issues.   3. CAD,  nonobstructive: no angina. Current medications will be continued. Advised to stop ASA since he is  anticoagulated with warfarin.   4. Aortic valve disease s/p bioprosthetic AVR: stable valve function by post-op echo.   Dispo: FU Dr Lovena Le next month as planned. Decision to be made regarding continuation of dofetilide. Encouraged increased exercise and formal referral to cardiac rehab is made today.   Current medicines are reviewed with the patient today.  The patient does not have concerns regarding medicines.  Labs/ tests ordered today include:  No orders of the defined types were placed in this encounter.   Disposition:   FU 6 months  Signed, [name], MD  06/28/2016 9:26 AM    [facility] [address], [place], [place]  [address] Phone: [phone]; Fax: [phone]

## 2016-06-28 NOTE — Progress Notes (Signed)
INR checked today in clinic 2.7.  Pt will continue 2.5mg  qd except 1.25mg  on Sunday and Thursday.  Pt will f/u in 4 weeks at [facility]. Pt to call and schedule next INR appointment.

## 2016-07-06 ENCOUNTER — Telehealth (HOSPITAL_COMMUNITY): Payer: Self-pay | Admitting: Family Medicine

## 2016-07-06 ENCOUNTER — Encounter (HOSPITAL_COMMUNITY): Payer: Self-pay | Admitting: Family Medicine

## 2016-07-06 NOTE — Telephone Encounter (Signed)
Mailed letter with Cardiac Rehab Program along with my chart message... KJ  °

## 2016-07-11 ENCOUNTER — Telehealth (HOSPITAL_COMMUNITY): Payer: Self-pay | Admitting: Family Medicine

## 2016-07-11 NOTE — Telephone Encounter (Signed)
S/w Mickel Baas with [employer] verifying benefits, $15 Co-pay,  No Deductible or Co-insurance, follow Medicare Guidelines, no limits of classes [place] of [place] $3400.00 pt has met $39.00, pt's balance is $3361.00. Reference # [id number]  Also sent pt msg through my chart letting him know insurance verification and that someone will contact him to set him up with the classes... KJ

## 2016-07-25 ENCOUNTER — Ambulatory Visit (INDEPENDENT_AMBULATORY_CARE_PROVIDER_SITE_OTHER): Payer: PPO

## 2016-07-25 DIAGNOSIS — Z953 Presence of xenogenic heart valve: Secondary | ICD-10-CM

## 2016-07-25 DIAGNOSIS — Z5181 Encounter for therapeutic drug level monitoring: Secondary | ICD-10-CM | POA: Diagnosis not present

## 2016-07-25 LAB — POCT INR: INR: 2.6

## 2016-07-25 NOTE — Patient Instructions (Signed)
Pre visit review using our clinic review tool, if applicable. No additional management support is needed unless otherwise documented below in the visit note. 

## 2016-08-02 ENCOUNTER — Other Ambulatory Visit: Payer: Self-pay | Admitting: *Deleted

## 2016-08-02 DIAGNOSIS — M62838 Other muscle spasm: Secondary | ICD-10-CM

## 2016-08-02 NOTE — Telephone Encounter (Signed)
Ok to refill? Last filled 06/20/16 #30 0RF. Requests 90 day supply

## 2016-08-03 MED ORDER — TIZANIDINE HCL 2 MG PO TABS: ORAL_TABLET | ORAL | 0 refills | Status: DC

## 2016-08-08 ENCOUNTER — Encounter (HOSPITAL_COMMUNITY)
Admission: RE | Admit: 2016-08-08 | Discharge: 2016-08-08 | Disposition: A | Discharge status: Home / Self Care | Payer: PPO | Source: Ambulatory Visit | Attending: Internal Medicine | Admitting: Internal Medicine

## 2016-08-08 VITALS — BP 131/67 | HR 80 | Ht 73.0 in | Wt 238.8 lb

## 2016-08-08 DIAGNOSIS — Z952 Presence of prosthetic heart valve: Secondary | ICD-10-CM

## 2016-08-08 DIAGNOSIS — Z9889 Other specified postprocedural states: Secondary | ICD-10-CM | POA: Diagnosis not present

## 2016-08-08 DIAGNOSIS — Z8679 Personal history of other diseases of the circulatory system: Secondary | ICD-10-CM | POA: Diagnosis not present

## 2016-08-08 DIAGNOSIS — Z953 Presence of xenogenic heart valve: Secondary | ICD-10-CM | POA: Diagnosis not present

## 2016-08-08 NOTE — Progress Notes (Signed)
Cardiac Individual Treatment Plan  Patient Details  Name: Bill [name] MRN: [id number] Date of Birth: [birth year] Referring Provider:     CARDIAC REHAB PHASE II ORIENTATION from 08/08/2016 in [place]  Referring Provider  Mikle Bosworth      Initial Encounter Date:    CARDIAC REHAB PHASE II ORIENTATION from 08/08/2016 in [place]  Date  08/08/16  Referring Provider  Mikle Bosworth      Visit Diagnosis: S/P AVR  Patient's Home Medications on Admission:  Current Outpatient Prescriptions:  .  cholecalciferol (VITAMIN D) 1000 UNITS tablet, Take 1,000 Units by mouth every morning. , Disp: , Rfl:  .  dofetilide (TIKOSYN) 250 MCG capsule, Take 1 capsule (250 mcg total) by mouth 2 (two) times daily., Disp: 60 capsule, Rfl: 8 .  glipiZIDE (GLUCOTROL) 10 MG tablet, Take 1 tablet by mouth daily with breakfast and 1/2 tablet daily with dinner., Disp: 135 tablet, Rfl: 1 .  glucose blood (ONE TOUCH ULTRA TEST) test strip, Use to check sugar once daily and as needed. Dx:E11.319 **One Touch Ultra**, Disp: 100 each, Rfl: 2 .  Magnesium Oxide 250 MG TABS, Take 1 tablet by mouth daily as needed (supplement). , Disp: , Rfl:  .  metFORMIN (GLUCOPHAGE) 500 MG tablet, Take 1 tablet (500 mg total) by mouth 2 (two) times daily with a meal., Disp: 180 tablet, Rfl: 1 .  Multiple Vitamin (MULTIVITAMIN WITH MINERALS) TABS tablet, Take by mouth daily. 1 packet of 5 vitamins daily, Disp: , Rfl:  .  multivitamin-lutein (OCUVITE-LUTEIN) CAPS capsule, Take 3 capsules by mouth daily., Disp: , Rfl:  .  oxybutynin (DITROPAN) 5 MG tablet, Take 1 tablet (5 mg total) by mouth 3 (three) times daily., Disp: 270 tablet, Rfl: 3 .  vitamin B-12 (CYANOCOBALAMIN) 1000 MCG tablet, Take 1,000 mcg by mouth daily., Disp: , Rfl:  .  warfarin (COUMADIN) 2.5 MG tablet, Take 1 tablet (2.5 mg total) by mouth daily at 6 PM. Or as directed., Disp: 35 tablet, Rfl: 2 .   acetaminophen (TYLENOL) 325 MG tablet, Take 650 mg by mouth every 4 (four) hours as needed for mild pain or fever., Disp: , Rfl:  .  pravastatin (PRAVACHOL) 20 MG tablet, Take 1 tablet (20 mg total) by mouth every evening., Disp: 90 tablet, Rfl: 3 .  tiZANidine (ZANAFLEX) 2 MG tablet, Take 1 to 2 tablets by mouth every 8 hours as needed for muscle spasms. (Patient not taking: Reported on 08/08/2016), Disp: 90 tablet, Rfl: 0  Past Medical History: Past Medical History:  Diagnosis Date  . Aortic stenosis 12/2010   s/p bioprosthetic AVR 9/17 // Echo 6/17: EF 55-60, mean AV 25 mmHg // TEE 6/17: severe AS, AVA 0.93, mean AV 43 mmHg // post op Echo 02/07/16: mod conc LVH, EF 65-70, vigorous LVF, AVR ok, mean 9 mmHg, MAC, trivial MR, mod LAE, trivial pericardial eff  . Chronic diastolic CHF (congestive heart failure) ([place])   . Coronary artery disease    LHC 8/17: pLAD 20, mLAD 30, dLAD 20, mRCA 30, dRCA 20, RPDA 20  . Exogenous obesity   . GERD (gastroesophageal reflux disease)    occasional  . History of chicken pox   . Hypertension   . Kidney stones remote  . Lyme disease 2012   ?titers negative  . Neuromuscular disorder (HCC)    tremor right arm  . Persistent atrial fibrillation (HCC)    Tikosyn // s/p Maze  procedure 9/17 // Anticoagulation undesirable 2/2 hx of retinal hemorrhage (worked up for [employer] but referred for Maze at time of AVR 2/2 aortic stenosis)   . PFO (patent foramen ovale)    s/p PFO closure at time of AVR in 9/17  . S/P aortic valve replacement with bioprosthetic valve 01/27/2016   27 mm [facility] Ease bovine pericardial tissue valve  . S/P Maze operation for atrial fibrillation 01/27/2016   Complete bilateral atrial lesion set using bipolar radiofrequency and cryothermy ablation with clipping of LA appendage  . Type 2 diabetes, controlled, with retinopathy ([place]) 1980s  . Urge incontinence   . Vitamin B12 deficiency    IF normal (2014)    Tobacco Use: History   Smoking Status  . Never Smoker  Smokeless Tobacco  . Never Used    Labs: Recent Review Flowsheet Data    Labs for ITP Cardiac and Pulmonary Rehab Latest Ref Rng & Units 01/27/2016 01/28/2016 01/28/2016 01/28/2016 03/31/2016   Cholestrol 0 - 200 mg/dL - - - - 141   LDLCALC 0 - 99 mg/dL - - - - 74   LDLDIRECT mg/dL - - - - -   HDL >39.00 mg/dL - - - - 49.50   Trlycerides 0.0 - 149.0 mg/dL - - - - 87.0   Hemoglobin A1c 4.8 - 5.6 % - - - - -   PHART 7.350 - 7.450 - 7.294(L) 7.302(L) - -   PCO2ART 32.0 - 48.0 mmHg - 40.1 39.9 - -   HCO3 20.0 - 28.0 mmol/L - 19.3(L) 19.5(L) - -   TCO2 0 - 100 mmol/L _0 -   ACIDBASEDEF 0.0 - 2.0 mmol/L - 7.0(H) 6.0(H) - -   O2SAT % - 98.0 96.0 - -      Capillary Blood Glucose: Lab Results  Component Value Date   GLUCAP 122 (H) 02/02/2016   GLUCAP 142 (H) 02/02/2016   GLUCAP 154 (H) 02/01/2016   GLUCAP 173 (H) 02/01/2016   GLUCAP 157 (H) 02/01/2016     Exercise Target Goals: Date: 08/08/16  Exercise Program Goal: Individual exercise prescription set with THRR, safety & activity barriers. Participant demonstrates ability to understand and report RPE using BORG scale, to self-measure pulse accurately, and to acknowledge the importance of the exercise prescription.  Exercise Prescription Goal: Starting with aerobic activity 30 plus minutes a day, 3 days per week for initial exercise prescription. Provide home exercise prescription and guidelines that participant acknowledges understanding prior to discharge.  Activity Barriers & Risk Stratification:     Activity Barriers & Cardiac Risk Stratification - 08/08/16 0923      Activity Barriers & Cardiac Risk Stratification   Activity Barriers Back Problems;Deconditioning;Muscular Weakness   Cardiac Risk Stratification High      6 Minute Walk:     6 Minute Walk    Row Name 08/08/16 1225         6 Minute Walk   Phase Initial     Distance 1221 feet     Walk Time 6 minutes     # of  Rest Breaks 0     MPH 2.31     METS 2.32     RPE 12     VO2 Peak 8.15     Symptoms No     Resting HR 80 bpm     Resting BP 131/67     Max Ex. HR 103 bpm     Max Ex. BP 144/70  2 Minute Post BP 128/74        Oxygen Initial Assessment:   Oxygen Re-Evaluation:   Oxygen Discharge (Final Oxygen Re-Evaluation):   Initial Exercise Prescription:     Initial Exercise Prescription - 08/08/16 1200      Date of Initial Exercise RX and Referring Provider   Date 08/08/16   Referring Provider Mikle Bosworth     NuStep   Level 3   SPM 60   Minutes 10   METs 2     Arm Ergometer   Level 1   Minutes 10   METs 1     Track   Laps 8   Minutes 10   METs 2.39     Prescription Details   Frequency (times per week) 3   Duration Progress to 30 minutes of continuous aerobic without signs/symptoms of physical distress     Intensity   THRR 40-80% of Max Heartrate 57-114   Ratings of Perceived Exertion 11-13   Perceived Dyspnea 0-4     Progression   Progression Continue to progress workloads to maintain intensity without signs/symptoms of physical distress.     Resistance Training   Training Prescription Yes   Weight 2lbs   Reps 10-15      Perform Capillary Blood Glucose checks as needed.  Exercise Prescription Changes:   Exercise Comments:   Exercise Goals and Review:     Exercise Goals    Row Name 08/08/16 [phone]             Exercise Goals   Increase Physical Activity Yes       Intervention Provide advice, education, support and counseling about physical activity/exercise needs.;Develop an individualized exercise prescription for aerobic and resistive training based on initial evaluation findings, risk stratification, comorbidities and participant's personal goals.       Expected Outcomes Achievement of increased cardiorespiratory fitness and enhanced flexibility, muscular endurance and strength shown through measurements of functional capacity and personal  statement of participant.       Increase Strength and Stamina Yes       Intervention Provide advice, education, support and counseling about physical activity/exercise needs.;Develop an individualized exercise prescription for aerobic and resistive training based on initial evaluation findings, risk stratification, comorbidities and participant's personal goals.       Expected Outcomes Achievement of increased cardiorespiratory fitness and enhanced flexibility, muscular endurance and strength shown through measurements of functional capacity and personal statement of participant.          Exercise Goals Re-Evaluation :    Discharge Exercise Prescription (Final Exercise Prescription Changes):   Nutrition:  Target Goals: Understanding of nutrition guidelines, daily intake of sodium <1549m, cholesterol <2023m calories 30% from fat and 7% or less from saturated fats, daily to have 5 or more servings of fruits and vegetables.  Biometrics:     Pre Biometrics - 08/08/16 1226      Pre Biometrics   Waist Circumference 43 inches   Hip Circumference 51.25 inches   Waist to Hip Ratio 0.84 %   Triceps Skinfold 24 mm   % Body Fat 32 %   Grip Strength 35 kg   Flexibility 9.5 in   Single Leg Stand 2 seconds       Nutrition Therapy Plan and Nutrition Goals:   Nutrition Discharge: Nutrition Scores:   Nutrition Goals Re-Evaluation:   Nutrition Goals Re-Evaluation:   Nutrition Goals Discharge (Final Nutrition Goals Re-Evaluation):   Psychosocial: Target Goals: Acknowledge presence or absence of  significant depression and/or stress, maximize coping skills, provide positive support system. Participant is able to verbalize types and ability to use techniques and skills needed for reducing stress and depression.  Initial Review & Psychosocial Screening:     Initial Psych Review & Screening - 08/08/16 1619      Initial Review   Current issues with None Identified     Family  Dynamics   Good Support System? Yes  spouse-Donna    Comments Upon brief assessment, no psychosocial needs identified, no intervention necessary.       Barriers   Psychosocial barriers to participate in program There are no identifiable barriers or psychosocial needs.     Screening Interventions   Interventions Encouraged to exercise      Quality of Life Scores:     Quality of Life - 08/08/16 1235      Quality of Life Scores   Health/Function Pre 25.53 %   Socioeconomic Pre 26.2 %   Psych/Spiritual Pre 26.57 %   Family Pre 28.5 %   GLOBAL Pre 26.33 %      PHQ-9: Recent Review Flowsheet Data    Depression screen [facility] 2/9 02/21/2016 09/14/2015 09/08/2015 09/15/2014 09/03/2013   Decreased Interest 0 0 0 0 0   Down, Depressed, Hopeless 0 0 0 0 0   PHQ - 2 Score 0 0 0 0 0     Interpretation of Total Score  Total Score Depression Severity:  1-4 = Minimal depression, 5-9 = Mild depression, 10-14 = Moderate depression, 15-19 = Moderately severe depression, 20-27 = Severe depression   Psychosocial Evaluation and Intervention:   Psychosocial Re-Evaluation:   Psychosocial Discharge (Final Psychosocial Re-Evaluation):   Vocational Rehabilitation: Provide vocational rehab assistance to qualifying candidates.   Vocational Rehab Evaluation & Intervention:     Vocational Rehab - 08/08/16 1619      Initial Vocational Rehab Evaluation & Intervention   Assessment shows need for Vocational Rehabilitation No  retired      Education: Education Goals: Education classes will be provided on a weekly basis, covering required topics. Participant will state understanding/return demonstration of topics presented.  Learning Barriers/Preferences:     Learning Barriers/Preferences - 08/08/16 0300      Learning Barriers/Preferences   Learning Barriers Sight   Learning Preferences Skilled Demonstration      Education Topics: Count Your Pulse:  -Group instruction provided by  verbal instruction, demonstration, patient participation and written materials to support subject.  Instructors address importance of being able to find your pulse and how to count your pulse when at home without a heart monitor.  Patients get hands on experience counting their pulse with staff help and individually.   Heart Attack, Angina, and Risk Factor Modification:  -Group instruction provided by verbal instruction, video, and written materials to support subject.  Instructors address signs and symptoms of angina and heart attacks.    Also discuss risk factors for heart disease and how to make changes to improve heart health risk factors.   Functional Fitness:  -Group instruction provided by verbal instruction, demonstration, patient participation, and written materials to support subject.  Instructors address safety measures for doing things around the house.  Discuss how to get up and down off the floor, how to pick things up properly, how to safely get out of a chair without assistance, and balance training.   Meditation and Mindfulness:  -Group instruction provided by verbal instruction, patient participation, and written materials to support subject.  Instructor addresses importance  of mindfulness and meditation practice to help reduce stress and improve awareness.  Instructor also leads participants through a meditation exercise.    Stretching for Flexibility and Mobility:  -Group instruction provided by verbal instruction, patient participation, and written materials to support subject.  Instructors lead participants through series of stretches that are designed to increase flexibility thus improving mobility.  These stretches are additional exercise for major muscle groups that are typically performed during regular warm up and cool down.   Hands Only CPR Anytime:  -Group instruction provided by verbal instruction, video, patient participation and written materials to support  subject.  Instructors co-teach with AHA video for hands only CPR.  Participants get hands on experience with mannequins.   Nutrition I class: Heart Healthy Eating:  -Group instruction provided by PowerPoint slides, verbal discussion, and written materials to support subject matter. The instructor gives an explanation and review of the Therapeutic Lifestyle Changes diet recommendations, which includes a discussion on lipid goals, dietary fat, sodium, fiber, plant stanol/sterol esters, sugar, and the components of a well-balanced, healthy diet.   Nutrition II class: Lifestyle Skills:  -Group instruction provided by PowerPoint slides, verbal discussion, and written materials to support subject matter. The instructor gives an explanation and review of label reading, grocery shopping for heart health, heart healthy recipe modifications, and ways to make healthier choices when eating out.   Diabetes Question & Answer:  -Group instruction provided by PowerPoint slides, verbal discussion, and written materials to support subject matter. The instructor gives an explanation and review of diabetes co-morbidities, pre- and post-prandial blood glucose goals, pre-exercise blood glucose goals, signs, symptoms, and treatment of hypoglycemia and hyperglycemia, and foot care basics.   Diabetes Blitz:  -Group instruction provided by PowerPoint slides, verbal discussion, and written materials to support subject matter. The instructor gives an explanation and review of the physiology behind type 1 and type 2 diabetes, diabetes medications and rational behind using different medications, pre- and post-prandial blood glucose recommendations and Hemoglobin A1c goals, diabetes diet, and exercise including blood glucose guidelines for exercising safely.    Portion Distortion:  -Group instruction provided by PowerPoint slides, verbal discussion, written materials, and food models to support subject matter. The instructor  gives an explanation of serving size versus portion size, changes in portions sizes over the last 20 years, and what consists of a serving from each food group.   Stress Management:  -Group instruction provided by verbal instruction, video, and written materials to support subject matter.  Instructors review role of stress in heart disease and how to cope with stress positively.     Exercising on Your Own:  -Group instruction provided by verbal instruction, power point, and written materials to support subject.  Instructors discuss benefits of exercise, components of exercise, frequency and intensity of exercise, and end points for exercise.  Also discuss use of nitroglycerin and activating EMS.  Review options of places to exercise outside of rehab.  Review guidelines for sex with heart disease.   Cardiac Drugs I:  -Group instruction provided by verbal instruction and written materials to support subject.  Instructor reviews cardiac drug classes: antiplatelets, anticoagulants, beta blockers, and statins.  Instructor discusses reasons, side effects, and lifestyle considerations for each drug class.   Cardiac Drugs II:  -Group instruction provided by verbal instruction and written materials to support subject.  Instructor reviews cardiac drug classes: angiotensin converting enzyme inhibitors (ACE-I), angiotensin II receptor blockers (ARBs), nitrates, and calcium channel blockers.  Instructor discusses reasons, side  effects, and lifestyle considerations for each drug class.   Anatomy and Physiology of the Circulatory System:  -Group instruction provided by verbal instruction, video, and written materials to support subject.  Reviews functional anatomy of heart, how it relates to various diagnoses, and what role the heart plays in the overall system.   Knowledge Questionnaire Score:     Knowledge Questionnaire Score - 08/08/16 1234      Knowledge Questionnaire Score   Pre Score 21/24       Core Components/Risk Factors/Patient Goals at Admission:     Personal Goals and Risk Factors at Admission - 08/08/16 0924      Core Components/Risk Factors/Patient Goals on Admission    Weight Management Yes;Weight Loss;Weight Maintenance;Obesity   Intervention Weight Management: Develop a combined nutrition and exercise program designed to reach desired caloric intake, while maintaining appropriate intake of nutrient and fiber, sodium and fats, and appropriate energy expenditure required for the weight goal.;Weight Management: Provide education and appropriate resources to help participant work on and attain dietary goals.;Weight Management/Obesity: Establish reasonable short term and long term weight goals.;Obesity: Provide education and appropriate resources to help participant work on and attain dietary goals.   Expected Outcomes Short Term: Continue to assess and modify interventions until short term weight is achieved;Weight Maintenance: Understanding of the daily nutrition guidelines, which includes 25-35% calories from fat, 7% or less cal from saturated fats, less than 229m cholesterol, less than 1.5gm of sodium, & 5 or more servings of fruits and vegetables daily;Weight Loss: Understanding of general recommendations for a balanced deficit meal plan, which promotes 1-2 lb weight loss per week and includes a negative energy balance of [phone] kcal/d;Understanding recommendations for meals to include 15-35% energy as protein, 25-35% energy from fat, 35-60% energy from carbohydrates, less than 2036mof dietary cholesterol, 20-35 gm of total fiber daily;Understanding of distribution of calorie intake throughout the day with the consumption of 4-5 meals/snacks   Diabetes Yes   Intervention Provide education about signs/symptoms and action to take for hypo/hyperglycemia.;Provide education about proper nutrition, including hydration, and aerobic/resistive exercise prescription along with  prescribed medications to achieve blood glucose in normal ranges: Fasting glucose 65-99 mg/dL   Expected Outcomes Short Term: Participant verbalizes understanding of the signs/symptoms and immediate care of hyper/hypoglycemia, proper foot care and importance of medication, aerobic/resistive exercise and nutrition plan for blood glucose control.;Long Term: Attainment of HbA1C < 7%.   Hypertension Yes   Intervention Provide education on lifestyle modifcations including regular physical activity/exercise, weight management, moderate sodium restriction and increased consumption of fresh fruit, vegetables, and low fat dairy, alcohol moderation, and smoking cessation.;Monitor prescription use compliance.   Expected Outcomes Short Term: Continued assessment and intervention until BP is < 140/9047mG in hypertensive participants. < 130/32m36m in hypertensive participants with diabetes, heart failure or chronic kidney disease.;Long Term: Maintenance of blood pressure at goal levels.   Personal Goal Other Yes   Personal Goal goal weight at 210, and to drop one pants size. Currently at 48 a23 desired pants size 46   Intervention Provide exercise programming and nutrition couseling to assist with weightloss goals    Expected Outcomes Pt will lose wt and get to desired pants size of 46.      Core Components/Risk Factors/Patient Goals Review:    Core Components/Risk Factors/Patient Goals at Discharge (Final Review):    ITP Comments:     ITP Comments    Row Name 08/08/16 [id number]  ITP Comments Dr. Fransico Him, Medical Director          Comments: Patient attended orientation from 0818 to 1007am  to review rules and guidelines for program. Completed 6 minute walk test, Intitial ITP, and exercise prescription.  VSS. Telemetry-atrial fibrillation,   Asymptomatic.

## 2016-08-08 NOTE — Progress Notes (Signed)
Cardiac Rehab Medication Review by a Pharmacist  Does the patient  feel that his/her medications are working for him/her?  yes  Has the patient been experiencing any side effects to the medications prescribed?  no  Does the patient measure his/her own blood pressure or blood glucose at home?  yes   Does the patient have any problems obtaining medications due to transportation or finances?   no  Understanding of regimen: good Understanding of indications: good Potential of compliance: good    Pharmacist comments: [name] is a pleasant Caucasian male presenting to cardiac rehab without assistance. He endorses compliance with his medications, and has no questions or concerns about his regimen at this time.    [name], PharmD PGY1 Pharmacy Resident [phone] (Pager) 08/08/2016 8:41 AM

## 2016-08-14 ENCOUNTER — Encounter (HOSPITAL_COMMUNITY): Payer: Self-pay

## 2016-08-14 ENCOUNTER — Encounter (HOSPITAL_COMMUNITY)
Admission: RE | Admit: 2016-08-14 | Discharge: 2016-08-14 | Disposition: A | Discharge status: Home / Self Care | Payer: PPO | Source: Ambulatory Visit | Attending: Internal Medicine | Admitting: Internal Medicine

## 2016-08-14 DIAGNOSIS — Z953 Presence of xenogenic heart valve: Secondary | ICD-10-CM | POA: Diagnosis not present

## 2016-08-14 DIAGNOSIS — Z952 Presence of prosthetic heart valve: Secondary | ICD-10-CM

## 2016-08-14 LAB — GLUCOSE, CAPILLARY
GLUCOSE-CAPILLARY: 119 mg/dL — AB (ref 65–99)
Glucose-Capillary: 192 mg/dL — ABNORMAL HIGH (ref 65–99)

## 2016-08-14 NOTE — Progress Notes (Signed)
Daily Session Note  Patient Details  Name: Bill [name] MRN: [id number] Date of Birth: 1941/01/21 Referring Provider:     CARDIAC REHAB PHASE II ORIENTATION from 08/08/2016 in [place]  Referring Provider  Mikle Bosworth      Encounter Date: 08/14/2016  Check In:     Session Check In - 08/14/16 0858      Check-In   Location [facility]   Staff Present Cleda Mccreedy, MS, Exercise Physiologist;Amber Fair, MS, ACSM RCEP, Exercise Physiologist;Kristle Wesch, RN, Marga Melnick, RN, BSN   Supervising physician immediately available to respond to emergencies [facility] immediately available   Physician(s) Dr. Tana Coast   Medication changes reported     No   Fall or balance concerns reported    No   Tobacco Cessation No Change   Warm-up and Cool-down Performed as group-led instruction   Resistance Training Performed Yes   VAD Patient? No     Pain Assessment   Currently in Pain? No/denies   Multiple Pain Sites No      Capillary Blood Glucose: No results found for this or any previous visit (from the past 24 hour(s)).    History  Smoking Status  . Never Smoker  Smokeless Tobacco  . Never Used    Goals Met:  Exercise tolerated well  Goals Unmet:  Not Applicable  Comments: Pt started cardiac rehab today.  Pt tolerated light exercise without difficulty. VSS, telemetry-sinus rhythm,  asymptomatic.  Medication list reconciled. Pt denies barriers to medicaiton compliance.  PSYCHOSOCIAL ASSESSMENT:  PHQ-0. Pt exhibits positive coping skills, hopeful outlook with supportive family. No psychosocial needs identified at this time, no psychosocial interventions necessary.    Pt enjoys gardening, carpentry (building birdhouses), mowing lawn, chopping trees and splitting wood.  Pt goals for cardiac rehab are to decrease fatigue and dyspnea. Pt is also interested in weight loss. Pt encouraged to participate in home exercise as  directed in addition to cardiac rehab activities.  Pt also encouraged to attend nutrition education classes.   Pt oriented to exercise equipment and routine.    Understanding verbalized.   Dr. Fransico Him is Medical Director for Cardiac Rehab at [facility].

## 2016-08-15 NOTE — Progress Notes (Signed)
Cardiac Individual Treatment Plan  Patient Details  Name: Bill [name] MRN: [id number] Date of Birth: March 27, 1941 Referring Provider:     CARDIAC REHAB PHASE II ORIENTATION from 08/08/2016 in [place]  Referring Provider  Mikle Bosworth      Initial Encounter Date:    CARDIAC REHAB PHASE II ORIENTATION from 08/08/2016 in [place]  Date  08/08/16  Referring Provider  Mikle Bosworth      Visit Diagnosis: No diagnosis found.  Patient's Home Medications on Admission:  Current Outpatient Prescriptions:  .  acetaminophen (TYLENOL) 325 MG tablet, Take 650 mg by mouth every 4 (four) hours as needed for mild pain or fever., Disp: , Rfl:  .  cholecalciferol (VITAMIN D) 1000 UNITS tablet, Take 1,000 Units by mouth every morning. , Disp: , Rfl:  .  dofetilide (TIKOSYN) 250 MCG capsule, Take 1 capsule (250 mcg total) by mouth 2 (two) times daily., Disp: 60 capsule, Rfl: 8 .  glipiZIDE (GLUCOTROL) 10 MG tablet, Take 1 tablet by mouth daily with breakfast and 1/2 tablet daily with dinner., Disp: 135 tablet, Rfl: 1 .  glucose blood (ONE TOUCH ULTRA TEST) test strip, Use to check sugar once daily and as needed. Dx:E11.319 **One Touch Ultra**, Disp: 100 each, Rfl: 2 .  Magnesium Oxide 250 MG TABS, Take 1 tablet by mouth daily as needed (supplement). , Disp: , Rfl:  .  metFORMIN (GLUCOPHAGE) 500 MG tablet, Take 1 tablet (500 mg total) by mouth 2 (two) times daily with a meal., Disp: 180 tablet, Rfl: 1 .  Multiple Vitamin (MULTIVITAMIN WITH MINERALS) TABS tablet, Take by mouth daily. 1 packet of 5 vitamins daily, Disp: , Rfl:  .  multivitamin-lutein (OCUVITE-LUTEIN) CAPS capsule, Take 3 capsules by mouth daily., Disp: , Rfl:  .  oxybutynin (DITROPAN) 5 MG tablet, Take 1 tablet (5 mg total) by mouth 3 (three) times daily., Disp: 270 tablet, Rfl: 3 .  pravastatin (PRAVACHOL) 20 MG tablet, Take 1 tablet (20 mg total) by mouth every  evening., Disp: 90 tablet, Rfl: 3 .  tiZANidine (ZANAFLEX) 2 MG tablet, Take 1 to 2 tablets by mouth every 8 hours as needed for muscle spasms., Disp: 90 tablet, Rfl: 0 .  vitamin B-12 (CYANOCOBALAMIN) 1000 MCG tablet, Take 1,000 mcg by mouth daily., Disp: , Rfl:  .  warfarin (COUMADIN) 2.5 MG tablet, Take 1 tablet (2.5 mg total) by mouth daily at 6 PM. Or as directed., Disp: 35 tablet, Rfl: 2  Past Medical History: Past Medical History:  Diagnosis Date  . Aortic stenosis 12/2010   s/p bioprosthetic AVR 9/17 // Echo 6/17: EF 55-60, mean AV 25 mmHg // TEE 6/17: severe AS, AVA 0.93, mean AV 43 mmHg // post op Echo 02/07/16: mod conc LVH, EF 65-70, vigorous LVF, AVR ok, mean 9 mmHg, MAC, trivial MR, mod LAE, trivial pericardial eff  . Chronic diastolic CHF (congestive heart failure) ([place])   . Coronary artery disease    LHC 8/17: pLAD 20, mLAD 30, dLAD 20, mRCA 30, dRCA 20, RPDA 20  . Exogenous obesity   . GERD (gastroesophageal reflux disease)    occasional  . History of chicken pox   . Hypertension   . Kidney stones remote  . Lyme disease 2012   ?titers negative  . Neuromuscular disorder (H[facility])    tremor right arm  . Persistent atrial fibrillation ([place])    Tikosyn // s/p Maze procedure 9/17 // Anticoagulation undesirable  2/2 hx of retinal hemorrhage (worked up for [employer] but referred for Maze at time of AVR 2/2 aortic stenosis)   . PFO (patent foramen ovale)    s/p PFO closure at time of AVR in 9/17  . S/P aortic valve replacement with bioprosthetic valve 01/27/2016   27 mm [facility] Ease bovine pericardial tissue valve  . S/P Maze operation for atrial fibrillation 01/27/2016   Complete bilateral atrial lesion set using bipolar radiofrequency and cryothermy ablation with clipping of LA appendage  . Type 2 diabetes, controlled, with retinopathy ([place]) 1980s  . Urge incontinence   . Vitamin B12 deficiency    IF normal (2014)    Tobacco Use: History  Smoking Status  . Never Smoker   Smokeless Tobacco  . Never Used    Labs: Recent Review Flowsheet Data    Labs for ITP Cardiac and Pulmonary Rehab Latest Ref Rng & Units 01/27/2016 01/28/2016 01/28/2016 01/28/2016 03/31/2016   Cholestrol 0 - 200 mg/dL - - - - 141   LDLCALC 0 - 99 mg/dL - - - - 74   LDLDIRECT mg/dL - - - - -   HDL >39.00 mg/dL - - - - 49.50   Trlycerides 0.0 - 149.0 mg/dL - - - - 87.0   Hemoglobin A1c 4.8 - 5.6 % - - - - -   PHART 7.350 - 7.450 - 7.294(L) 7.302(L) - -   PCO2ART 32.0 - 48.0 mmHg - 40.1 39.9 - -   HCO3 20.0 - 28.0 mmol/L - 19.3(L) 19.5(L) - -   TCO2 0 - 100 mmol/L _0 -   ACIDBASEDEF 0.0 - 2.0 mmol/L - 7.0(H) 6.0(H) - -   O2SAT % - 98.0 96.0 - -      Capillary Blood Glucose: Lab Results  Component Value Date   GLUCAP 192 (H) 08/14/2016   GLUCAP 119 (H) 08/14/2016   GLUCAP 122 (H) 02/02/2016   GLUCAP 142 (H) 02/02/2016   GLUCAP 154 (H) 02/01/2016     Exercise Target Goals:    Exercise Program Goal: Individual exercise prescription set with THRR, safety & activity barriers. Participant demonstrates ability to understand and report RPE using BORG scale, to self-measure pulse accurately, and to acknowledge the importance of the exercise prescription.  Exercise Prescription Goal: Starting with aerobic activity 30 plus minutes a day, 3 days per week for initial exercise prescription. Provide home exercise prescription and guidelines that participant acknowledges understanding prior to discharge.  Activity Barriers & Risk Stratification:     Activity Barriers & Cardiac Risk Stratification - 08/08/16 0923      Activity Barriers & Cardiac Risk Stratification   Activity Barriers Back Problems;Deconditioning;Muscular Weakness   Cardiac Risk Stratification High      6 Minute Walk:     6 Minute Walk    Row Name 08/08/16 1225         6 Minute Walk   Phase Initial     Distance 1221 feet     Walk Time 6 minutes     # of Rest Breaks 0     MPH 2.31     METS 2.32      RPE 12     VO2 Peak 8.15     Symptoms No     Resting HR 80 bpm     Resting BP 131/67     Max Ex. HR 103 bpm     Max Ex. BP 144/70     2 Minute Post BP 128/74  Oxygen Initial Assessment:   Oxygen Re-Evaluation:   Oxygen Discharge (Final Oxygen Re-Evaluation):   Initial Exercise Prescription:     Initial Exercise Prescription - 08/08/16 1200      Date of Initial Exercise RX and Referring Provider   Date 08/08/16   Referring Provider Mikle Bosworth     NuStep   Level 3   SPM 60   Minutes 10   METs 2     Arm Ergometer   Level 1   Minutes 10   METs 1     Track   Laps 8   Minutes 10   METs 2.39     Prescription Details   Frequency (times per week) 3   Duration Progress to 30 minutes of continuous aerobic without signs/symptoms of physical distress     Intensity   THRR 40-80% of Max Heartrate 57-114   Ratings of Perceived Exertion 11-13   Perceived Dyspnea 0-4     Progression   Progression Continue to progress workloads to maintain intensity without signs/symptoms of physical distress.     Resistance Training   Training Prescription Yes   Weight 2lbs   Reps 10-15      Perform Capillary Blood Glucose checks as needed.  Exercise Prescription Changes:     Exercise Prescription Changes    Row Name 08/14/16 1600             Response to Exercise   Blood Pressure (Admit) -       Blood Pressure (Exercise) -       Blood Pressure (Exit) -       Heart Rate (Admit) -       Heart Rate (Exercise) -       Heart Rate (Exit) -       Rating of Perceived Exertion (Exercise) -       Symptoms -       Duration -       Intensity -         Resistance Training   Training Prescription -       Weight -       Reps -       Time -         NuStep   Level -       SPM -       Minutes -       METs -         Arm Ergometer   Level -       Minutes -       METs -          Exercise Comments:   Exercise Goals and Review:     Exercise Goals     Row Name 08/08/16 0923             Exercise Goals   Increase Physical Activity Yes       Intervention Provide advice, education, support and counseling about physical activity/exercise needs.;Develop an individualized exercise prescription for aerobic and resistive training based on initial evaluation findings, risk stratification, comorbidities and participant's personal goals.       Expected Outcomes Achievement of increased cardiorespiratory fitness and enhanced flexibility, muscular endurance and strength shown through measurements of functional capacity and personal statement of participant.       Increase Strength and Stamina Yes       Intervention Provide advice, education, support and counseling about physical activity/exercise needs.;Develop an individualized exercise prescription for aerobic and resistive  training based on initial evaluation findings, risk stratification, comorbidities and participant's personal goals.       Expected Outcomes Achievement of increased cardiorespiratory fitness and enhanced flexibility, muscular endurance and strength shown through measurements of functional capacity and personal statement of participant.          Exercise Goals Re-Evaluation :    Discharge Exercise Prescription (Final Exercise Prescription Changes):     Exercise Prescription Changes - 08/14/16 1600      Response to Exercise   Blood Pressure (Admit) --   Blood Pressure (Exercise) --   Blood Pressure (Exit) --   Heart Rate (Admit) --   Heart Rate (Exercise) --   Heart Rate (Exit) --   Rating of Perceived Exertion (Exercise) --   Symptoms --   Duration --   Intensity --     Resistance Training   Training Prescription --   Weight --   Reps --   Time --     NuStep   Level --   SPM --   Minutes --   METs --     Arm Ergometer   Level --   Minutes --   METs --      Nutrition:  Target Goals: Understanding of nutrition guidelines, daily intake of sodium  <1582m, cholesterol <2074m calories 30% from fat and 7% or less from saturated fats, daily to have 5 or more servings of fruits and vegetables.  Biometrics:     Pre Biometrics - 08/08/16 1226      Pre Biometrics   Waist Circumference 43 inches   Hip Circumference 51.25 inches   Waist to Hip Ratio 0.84 %   Triceps Skinfold 24 mm   % Body Fat 32 %   Grip Strength 35 kg   Flexibility 9.5 in   Single Leg Stand 2 seconds       Nutrition Therapy Plan and Nutrition Goals:   Nutrition Discharge: Nutrition Scores:   Nutrition Goals Re-Evaluation:   Nutrition Goals Re-Evaluation:   Nutrition Goals Discharge (Final Nutrition Goals Re-Evaluation):   Psychosocial: Target Goals: Acknowledge presence or absence of significant depression and/or stress, maximize coping skills, provide positive support system. Participant is able to verbalize types and ability to use techniques and skills needed for reducing stress and depression.  Initial Review & Psychosocial Screening:     Initial Psych Review & Screening - 08/15/16 1651      Initial Review   Current issues with None Identified     Family Dynamics   Good Support System? Yes   Comments Upon brief assessment, no psychosocial needs identified, no intervention necessary.       Barriers   Psychosocial barriers to participate in program There are no identifiable barriers or psychosocial needs.     Screening Interventions   Interventions Yes   Expected Outcomes Long Term Goal: Stressors or current issues are controlled or eliminated.      Quality of Life Scores:     Quality of Life - 08/08/16 1235      Quality of Life Scores   Health/Function Pre 25.53 %   Socioeconomic Pre 26.2 %   Psych/Spiritual Pre 26.57 %   Family Pre 28.5 %   GLOBAL Pre 26.33 %      PHQ-9: Recent Review Flowsheet Data    [facility] 08/14/2016 02/21/2016 09/14/2015 09/08/2015 09/15/2014   Decreased Interest 0 0 0 0 0   Down,  Depressed, Hopeless 0 0 0 0 0   PHQ -  2 Score 0 0 0 0 0     Interpretation of Total Score  Total Score Depression Severity:  1-4 = Minimal depression, 5-9 = Mild depression, 10-14 = Moderate depression, 15-19 = Moderately severe depression, 20-27 = Severe depression   Psychosocial Evaluation and Intervention:     Psychosocial Evaluation - 08/15/16 1652      Psychosocial Evaluation & Interventions   Interventions Encouraged to exercise with the program and follow exercise prescription   Expected Outcomes pt will demonstrate positive outlook with good coping skills.    Continue Psychosocial Services  No Follow up required      Psychosocial Re-Evaluation:   Psychosocial Discharge (Final Psychosocial Re-Evaluation):   Vocational Rehabilitation: Provide vocational rehab assistance to qualifying candidates.   Vocational Rehab Evaluation & Intervention:     Vocational Rehab - 08/08/16 1619      Initial Vocational Rehab Evaluation & Intervention   Assessment shows need for Vocational Rehabilitation No  retired      Education: Education Goals: Education classes will be provided on a weekly basis, covering required topics. Participant will state understanding/return demonstration of topics presented.  Learning Barriers/Preferences:     Learning Barriers/Preferences - 08/08/16 1610      Learning Barriers/Preferences   Learning Barriers Sight   Learning Preferences Skilled Demonstration      Education Topics: Count Your Pulse:  -Group instruction provided by verbal instruction, demonstration, patient participation and written materials to support subject.  Instructors address importance of being able to find your pulse and how to count your pulse when at home without a heart monitor.  Patients get hands on experience counting their pulse with staff help and individually.   Heart Attack, Angina, and Risk Factor Modification:  -Group instruction provided by verbal  instruction, video, and written materials to support subject.  Instructors address signs and symptoms of angina and heart attacks.    Also discuss risk factors for heart disease and how to make changes to improve heart health risk factors.   Functional Fitness:  -Group instruction provided by verbal instruction, demonstration, patient participation, and written materials to support subject.  Instructors address safety measures for doing things around the house.  Discuss how to get up and down off the floor, how to pick things up properly, how to safely get out of a chair without assistance, and balance training.   Meditation and Mindfulness:  -Group instruction provided by verbal instruction, patient participation, and written materials to support subject.  Instructor addresses importance of mindfulness and meditation practice to help reduce stress and improve awareness.  Instructor also leads participants through a meditation exercise.    Stretching for Flexibility and Mobility:  -Group instruction provided by verbal instruction, patient participation, and written materials to support subject.  Instructors lead participants through series of stretches that are designed to increase flexibility thus improving mobility.  These stretches are additional exercise for major muscle groups that are typically performed during regular warm up and cool down.   Hands Only CPR Anytime:  -Group instruction provided by verbal instruction, video, patient participation and written materials to support subject.  Instructors co-teach with AHA video for hands only CPR.  Participants get hands on experience with mannequins.   Nutrition I class: Heart Healthy Eating:  -Group instruction provided by PowerPoint slides, verbal discussion, and written materials to support subject matter. The instructor gives an explanation and review of the Therapeutic Lifestyle Changes diet recommendations, which includes a discussion on  lipid goals, dietary fat, sodium,  fiber, plant stanol/sterol esters, sugar, and the components of a well-balanced, healthy diet.   Nutrition II class: Lifestyle Skills:  -Group instruction provided by PowerPoint slides, verbal discussion, and written materials to support subject matter. The instructor gives an explanation and review of label reading, grocery shopping for heart health, heart healthy recipe modifications, and ways to make healthier choices when eating out.   Diabetes Question & Answer:  -Group instruction provided by PowerPoint slides, verbal discussion, and written materials to support subject matter. The instructor gives an explanation and review of diabetes co-morbidities, pre- and post-prandial blood glucose goals, pre-exercise blood glucose goals, signs, symptoms, and treatment of hypoglycemia and hyperglycemia, and foot care basics.   Diabetes Blitz:  -Group instruction provided by PowerPoint slides, verbal discussion, and written materials to support subject matter. The instructor gives an explanation and review of the physiology behind type 1 and type 2 diabetes, diabetes medications and rational behind using different medications, pre- and post-prandial blood glucose recommendations and Hemoglobin A1c goals, diabetes diet, and exercise including blood glucose guidelines for exercising safely.    Portion Distortion:  -Group instruction provided by PowerPoint slides, verbal discussion, written materials, and food models to support subject matter. The instructor gives an explanation of serving size versus portion size, changes in portions sizes over the last 20 years, and what consists of a serving from each food group.   Stress Management:  -Group instruction provided by verbal instruction, video, and written materials to support subject matter.  Instructors review role of stress in heart disease and how to cope with stress positively.     Exercising on Your Own:  -Group  instruction provided by verbal instruction, power point, and written materials to support subject.  Instructors discuss benefits of exercise, components of exercise, frequency and intensity of exercise, and end points for exercise.  Also discuss use of nitroglycerin and activating EMS.  Review options of places to exercise outside of rehab.  Review guidelines for sex with heart disease.   Cardiac Drugs I:  -Group instruction provided by verbal instruction and written materials to support subject.  Instructor reviews cardiac drug classes: antiplatelets, anticoagulants, beta blockers, and statins.  Instructor discusses reasons, side effects, and lifestyle considerations for each drug class.   Cardiac Drugs II:  -Group instruction provided by verbal instruction and written materials to support subject.  Instructor reviews cardiac drug classes: angiotensin converting enzyme inhibitors (ACE-I), angiotensin II receptor blockers (ARBs), nitrates, and calcium channel blockers.  Instructor discusses reasons, side effects, and lifestyle considerations for each drug class.   Anatomy and Physiology of the Circulatory System:  -Group instruction provided by verbal instruction, video, and written materials to support subject.  Reviews functional anatomy of heart, how it relates to various diagnoses, and what role the heart plays in the overall system.   Knowledge Questionnaire Score:     Knowledge Questionnaire Score - 08/08/16 1234      Knowledge Questionnaire Score   Pre Score 21/24      Core Components/Risk Factors/Patient Goals at Admission:     Personal Goals and Risk Factors at Admission - 08/08/16 0924      Core Components/Risk Factors/Patient Goals on Admission    Weight Management Yes;Weight Loss;Obesity   Intervention Weight Management: Develop a combined nutrition and exercise program designed to reach desired caloric intake, while maintaining appropriate intake of nutrient and fiber,  sodium and fats, and appropriate energy expenditure required for the weight goal.;Weight Management: Provide education and appropriate resources to help  participant work on and attain dietary goals.;Weight Management/Obesity: Establish reasonable short term and long term weight goals.;Obesity: Provide education and appropriate resources to help participant work on and attain dietary goals.   Expected Outcomes Short Term: Continue to assess and modify interventions until short term weight is achieved;Weight Maintenance: Understanding of the daily nutrition guidelines, which includes 25-35% calories from fat, 7% or less cal from saturated fats, less than 266m cholesterol, less than 1.5gm of sodium, & 5 or more servings of fruits and vegetables daily;Weight Loss: Understanding of general recommendations for a balanced deficit meal plan, which promotes 1-2 lb weight loss per week and includes a negative energy balance of [phone] kcal/d;Understanding recommendations for meals to include 15-35% energy as protein, 25-35% energy from fat, 35-60% energy from carbohydrates, less than 2046mof dietary cholesterol, 20-35 gm of total fiber daily;Understanding of distribution of calorie intake throughout the day with the consumption of 4-5 meals/snacks   Diabetes Yes   Intervention Provide education about signs/symptoms and action to take for hypo/hyperglycemia.;Provide education about proper nutrition, including hydration, and aerobic/resistive exercise prescription along with prescribed medications to achieve blood glucose in normal ranges: Fasting glucose 65-99 mg/dL   Expected Outcomes Short Term: Participant verbalizes understanding of the signs/symptoms and immediate care of hyper/hypoglycemia, proper foot care and importance of medication, aerobic/resistive exercise and nutrition plan for blood glucose control.;Long Term: Attainment of HbA1C < 7%.   Hypertension Yes   Intervention Provide education on lifestyle  modifcations including regular physical activity/exercise, weight management, moderate sodium restriction and increased consumption of fresh fruit, vegetables, and low fat dairy, alcohol moderation, and smoking cessation.;Monitor prescription use compliance.   Expected Outcomes Short Term: Continued assessment and intervention until BP is < 140/9012mG in hypertensive participants. < 130/97m3m in hypertensive participants with diabetes, heart failure or chronic kidney disease.;Long Term: Maintenance of blood pressure at goal levels.   Personal Goal Other Yes   Personal Goal goal weight at 210, and to drop one pants size. Currently at 48 a82 desired pants size 46   Intervention Provide exercise programming and nutrition couseling to assist with weightloss goals    Expected Outcomes Pt will lose wt and get to desired pants size of 46.      Core Components/Risk Factors/Patient Goals Review:    Core Components/Risk Factors/Patient Goals at Discharge (Final Review):    ITP Comments:     ITP Comments    Row Name 08/08/16 0920           ITP Comments Dr. TracFransico Himdical Director          Comments: Pt is making expected progress toward personal goals after completing 2 sessions. Recommend continued exercise and life style modification education including  stress management and relaxation techniques to decrease cardiac risk profile.

## 2016-08-16 ENCOUNTER — Encounter (HOSPITAL_COMMUNITY)
Admission: RE | Admit: 2016-08-16 | Discharge: 2016-08-16 | Disposition: A | Discharge status: Home / Self Care | Payer: PPO | Source: Ambulatory Visit | Attending: Internal Medicine | Admitting: Internal Medicine

## 2016-08-16 DIAGNOSIS — Z952 Presence of prosthetic heart valve: Secondary | ICD-10-CM

## 2016-08-16 DIAGNOSIS — Z953 Presence of xenogenic heart valve: Secondary | ICD-10-CM | POA: Diagnosis not present

## 2016-08-16 LAB — GLUCOSE, CAPILLARY
Glucose-Capillary: 138 mg/dL — ABNORMAL HIGH (ref 65–99)
Glucose-Capillary: 142 mg/dL — ABNORMAL HIGH (ref 65–99)

## 2016-08-17 DIAGNOSIS — Z9841 Cataract extraction status, right eye: Secondary | ICD-10-CM | POA: Diagnosis not present

## 2016-08-17 DIAGNOSIS — E113593 Type 2 diabetes mellitus with proliferative diabetic retinopathy without macular edema, bilateral: Secondary | ICD-10-CM | POA: Diagnosis not present

## 2016-08-17 DIAGNOSIS — H40023 Open angle with borderline findings, high risk, bilateral: Secondary | ICD-10-CM | POA: Diagnosis not present

## 2016-08-17 DIAGNOSIS — Z9842 Cataract extraction status, left eye: Secondary | ICD-10-CM | POA: Diagnosis not present

## 2016-08-17 LAB — HM DIABETES EYE EXAM

## 2016-08-18 ENCOUNTER — Encounter (HOSPITAL_COMMUNITY)
Admission: RE | Admit: 2016-08-18 | Discharge: 2016-08-18 | Disposition: A | Discharge status: Home / Self Care | Payer: PPO | Source: Ambulatory Visit | Attending: Internal Medicine | Admitting: Internal Medicine

## 2016-08-18 DIAGNOSIS — Z953 Presence of xenogenic heart valve: Secondary | ICD-10-CM | POA: Diagnosis not present

## 2016-08-18 DIAGNOSIS — Z952 Presence of prosthetic heart valve: Secondary | ICD-10-CM

## 2016-08-18 LAB — GLUCOSE, CAPILLARY: GLUCOSE-CAPILLARY: 159 mg/dL — AB (ref 65–99)

## 2016-08-21 ENCOUNTER — Encounter (HOSPITAL_COMMUNITY)
Admission: RE | Admit: 2016-08-21 | Discharge: 2016-08-21 | Disposition: A | Discharge status: Home / Self Care | Payer: PPO | Source: Ambulatory Visit | Attending: Cardiovascular Disease | Admitting: Cardiovascular Disease

## 2016-08-21 DIAGNOSIS — Z8679 Personal history of other diseases of the circulatory system: Secondary | ICD-10-CM | POA: Diagnosis not present

## 2016-08-21 DIAGNOSIS — Z952 Presence of prosthetic heart valve: Secondary | ICD-10-CM

## 2016-08-21 DIAGNOSIS — Z953 Presence of xenogenic heart valve: Secondary | ICD-10-CM | POA: Insufficient documentation

## 2016-08-21 DIAGNOSIS — Z9889 Other specified postprocedural states: Secondary | ICD-10-CM | POA: Diagnosis not present

## 2016-08-21 NOTE — Progress Notes (Signed)
[name] 76 y.o. male Nutrition Note Spoke with pt. Nutrition Plan and Nutrition Survey goals reviewed with pt. Pt is not following the Therapeutic Lifestyle Changes diet. Per discussion, pt does not plan on changing his dietary habits at this time. Pt consumes a significant amount of processed foods (salami, bacon, sausage, hot dogs, pickles, potato chips). Pt with dx of CHF, which pt denies. Pt encouraged to decrease sodium consumed. Pt wants to lose wt. Pt is in the contemplative state of change. Pt is diabetic. Last A1c indicates blood glucose well-controlled. Pt checks CBG's once daily. Fasting CBG's reportedly "greater than 80 and less than 120 mg/dL." Pt states his fasting CBG's are usually around 100. Pt is on Coumadin. Pt states he eats "a lot of salads." Pt asked about vitamin K intake/Coumadin. Pt was aware he needed to watch his vitamin K intake, but was unaware how to do this. Consistent vitamin K intake discussed. Pt expressed understanding of the information reviewed. Pt aware of nutrition education classes offered and plans on attending nutrition classes.  Lab Results  Component Value Date   HGBA1C 6.4 (H) 01/25/2016   Wt Readings from Last 3 Encounters:  08/08/16 238 lb 12.1 oz (108.3 kg)  06/28/16 240 lb (108.9 kg)  06/20/16 226 lb (102.5 kg)    Nutrition Diagnosis ? Food-and nutrition-related knowledge deficit related to lack of exposure to information as related to diagnosis of: ? CHF ? DM ? Obesity related to excessive energy intake as evidenced by a BMI of 31.6  Nutrition Intervention ? Pt's individual nutrition plan reviewed with pt. ? Benefits of adopting Therapeutic Lifestyle Changes discussed when Medficts reviewed. ? Pt to attend the Portion Distortion class ? Pt to attend the Diabetes Q & A class ? Pt to attend the   ? Nutrition I class                  ? Nutrition II class     ? Diabetes Blitz class ? Pt given handouts for: ? Consistent vit K  diet ? Continue client-centered nutrition education by RD, as part of interdisciplinary care. Goal(s) ? Pt to identify and limit food sources of saturated fat, trans fat, and sodium ? Pt to identify food quantities necessary to achieve weight loss of 6-24 lb (2.7-10.9 kg) at graduation from cardiac rehab.  Monitor and Evaluate progress toward nutrition goal with team. Derek Mound, M.Ed, RD, LDN, CDE 08/21/2016 9:33 AM

## 2016-08-22 ENCOUNTER — Encounter: Payer: Self-pay | Admitting: Family Medicine

## 2016-08-22 ENCOUNTER — Ambulatory Visit (INDEPENDENT_AMBULATORY_CARE_PROVIDER_SITE_OTHER): Payer: PPO

## 2016-08-22 DIAGNOSIS — Z953 Presence of xenogenic heart valve: Secondary | ICD-10-CM

## 2016-08-22 DIAGNOSIS — Z5181 Encounter for therapeutic drug level monitoring: Secondary | ICD-10-CM | POA: Diagnosis not present

## 2016-08-22 LAB — POCT INR: INR: 2.4

## 2016-08-22 NOTE — Patient Instructions (Signed)
Pre visit review using our clinic review tool, if applicable. No additional management support is needed unless otherwise documented below in the visit note. 

## 2016-08-23 ENCOUNTER — Encounter (HOSPITAL_COMMUNITY)
Admission: RE | Admit: 2016-08-23 | Discharge: 2016-08-23 | Disposition: A | Discharge status: Home / Self Care | Payer: PPO | Source: Ambulatory Visit | Attending: Internal Medicine | Admitting: Internal Medicine

## 2016-08-23 DIAGNOSIS — Z952 Presence of prosthetic heart valve: Secondary | ICD-10-CM

## 2016-08-23 DIAGNOSIS — Z953 Presence of xenogenic heart valve: Secondary | ICD-10-CM | POA: Diagnosis not present

## 2016-08-23 LAB — GLUCOSE, CAPILLARY: Glucose-Capillary: 91 mg/dL (ref 65–99)

## 2016-08-24 ENCOUNTER — Ambulatory Visit (INDEPENDENT_AMBULATORY_CARE_PROVIDER_SITE_OTHER): Payer: PPO | Admitting: Internal Medicine

## 2016-08-24 ENCOUNTER — Encounter: Payer: Self-pay | Admitting: Internal Medicine

## 2016-08-24 VITALS — BP 120/68 | HR 88 | Ht 74.0 in | Wt 239.8 lb

## 2016-08-24 DIAGNOSIS — I5032 Chronic diastolic (congestive) heart failure: Secondary | ICD-10-CM

## 2016-08-24 DIAGNOSIS — I4819 Other persistent atrial fibrillation: Secondary | ICD-10-CM

## 2016-08-24 DIAGNOSIS — I480 Paroxysmal atrial fibrillation: Secondary | ICD-10-CM

## 2016-08-24 DIAGNOSIS — I481 Persistent atrial fibrillation: Secondary | ICD-10-CM | POA: Diagnosis not present

## 2016-08-24 NOTE — Patient Instructions (Signed)
Medication Instructions:  Your physician has recommended you make the following change in your medication:  Stop dofetilide after you finish current prescription.   Labwork: none  Testing/Procedures: none  Follow-Up: Your physician recommends that you schedule a follow-up appointment as needed with Dr. Lovena Le. Follow up with Dr. [name] as planned.     Any Other Special Instructions Will Be Listed Below (If Applicable).  Please send your blood sugar results to primary care. [place] office (where you have your coumadin level checked) to send in coumadin refill.    If you need a refill on your cardiac medications before your next appointment, please call your pharmacy.

## 2016-08-24 NOTE — Progress Notes (Signed)
HPI Mr. Heigl returns today for EP followup.He is a 76 yo man with multiple medical problems including AS (mod-severe) and MR, who has atrial fib, who has multiple stroke risk factors and has also had bleeding in his eye. He underwent valve replacement several months ago. He has had atrial fibrillation. He had a MAZE with his surgery. He has been placed on dofetilide though he has not maintained NSR. He feels ok. He has started cardiac rehab. No other complaints today. Allergies  Allergen Reactions  . No Known Allergies      Current Outpatient Prescriptions  Medication Sig Dispense Refill  . acetaminophen (TYLENOL) 325 MG tablet Take 650 mg by mouth every 4 (four) hours as needed for mild pain or fever.    . cholecalciferol (VITAMIN D) 1000 UNITS tablet Take 1,000 Units by mouth as directed.     . dofetilide (TIKOSYN) 250 MCG capsule Take 1 capsule (250 mcg total) by mouth 2 (two) times daily. 60 capsule 8  . glipiZIDE (GLUCOTROL) 10 MG tablet Take 1 tablet by mouth daily with breakfast and 1/2 tablet daily with dinner. 135 tablet 1  . glucose blood (ONE TOUCH ULTRA TEST) test strip Use to check sugar once daily and as needed. Dx:E11.319 **One Touch Ultra** 100 each 2  . Magnesium Oxide 250 MG TABS Take 1 tablet by mouth daily as needed (supplement).     . metFORMIN (GLUCOPHAGE) 500 MG tablet Take 1 tablet (500 mg total) by mouth 2 (two) times daily with a meal. 180 tablet 1  . Multiple Vitamin (MULTIVITAMIN WITH MINERALS) TABS tablet Take by mouth daily. 1 packet of 5 vitamins daily    . multivitamin-lutein (OCUVITE-LUTEIN) CAPS capsule Take 3 capsules by mouth as directed.     Marland Kitchen oxybutynin (DITROPAN) 5 MG tablet Take 1 tablet (5 mg total) by mouth 3 (three) times daily. 270 tablet 3  . pravastatin (PRAVACHOL) 20 MG tablet Take 1 tablet (20 mg total) by mouth every evening. 90 tablet 3  . tiZANidine (ZANAFLEX) 2 MG tablet Take 1 to 2 tablets by mouth every 8 hours as needed for  muscle spasms. 90 tablet 0  . vitamin B-12 (CYANOCOBALAMIN) 1000 MCG tablet Take 1,000 mcg by mouth as directed.     . warfarin (COUMADIN) 2.5 MG tablet Take 1 tablet (2.5 mg total) by mouth daily at 6 PM. Or as directed. 35 tablet 2   No current facility-administered medications for this visit.      Past Medical History:  Diagnosis Date  . Aortic stenosis 12/2010   s/p bioprosthetic AVR 9/17 // Echo 6/17: EF 55-60, mean AV 25 mmHg // TEE 6/17: severe AS, AVA 0.93, mean AV 43 mmHg // post op Echo 02/07/16: mod conc LVH, EF 65-70, vigorous LVF, AVR ok, mean 9 mmHg, MAC, trivial MR, mod LAE, trivial pericardial eff  . Chronic diastolic CHF (congestive heart failure) ([place])   . Coronary artery disease    LHC 8/17: pLAD 20, mLAD 30, dLAD 20, mRCA 30, dRCA 20, RPDA 20  . Exogenous obesity   . GERD (gastroesophageal reflux disease)    occasional  . History of chicken pox   . Hypertension   . Kidney stones remote  . Lyme disease 2012   ?titers negative  . Neuromuscular disorder ([facility])    tremor right arm  . Persistent atrial fibrillation ([place])    Tikosyn // s/p Maze procedure 9/17 // Anticoagulation undesirable 2/2 hx of retinal hemorrhage (worked up  for [facility] but referred for Maze at time of AVR 2/2 aortic stenosis)   . PFO (patent foramen ovale)    s/p PFO closure at time of AVR in 9/17  . S/P aortic valve replacement with bioprosthetic valve 01/27/2016   27 mm [facility] Ease bovine pericardial tissue valve  . S/P Maze operation for atrial fibrillation 01/27/2016   Complete bilateral atrial lesion set using bipolar radiofrequency and cryothermy ablation with clipping of LA appendage  . Type 2 diabetes, controlled, with retinopathy ([place]) 1980s  . Urge incontinence   . Vitamin B12 deficiency    IF normal (2014)    ROS:   All systems reviewed and negative except as noted in the HPI.   Past Surgical History:  Procedure Laterality Date  . AORTIC VALVE REPLACEMENT N/A 01/27/2016    Procedure: AORTIC VALVE REPLACEMENT (AVR);  Surgeon: Rexene Alberts, MD;  Location: [place];  Service: Open Heart Surgery;  Laterality: N/A;  . BACK SURGERY  20 yrs ago   removal of lower back cartilage due to damage. Patient does not remember date of surgery.  Marland Kitchen CARDIAC CATHETERIZATION N/A 01/05/2016   Procedure: Right/Left Heart Cath and Coronary Angiography;  Surgeon: Burnell Blanks, MD;  Location: [place] CV LAB;  Service: Cardiovascular;  Laterality: N/A;  . CARDIOVERSION N/A 02/25/2015   Procedure: CARDIOVERSION;  Surgeon: Skeet Latch, MD;  Location: [facility] ENDOSCOPY;  Service: Cardiovascular;  Laterality: N/A;  . CATARACT EXTRACTION Bilateral 2015   [facility]  . COLONOSCOPY    . EYE SURGERY     left eye "cleaned"  . finger sx    . kidney stone removal    . MAZE N/A 01/27/2016   Procedure: MAZE;  Surgeon: Rexene Alberts, MD;  Location: [place];  Service: Open Heart Surgery;  Laterality: N/A;  . TEE WITHOUT CARDIOVERSION N/A 11/05/2015   Procedure: TRANSESOPHAGEAL ECHOCARDIOGRAM (TEE);  Surgeon: Pixie Casino, MD;  Location: [place];  Service: Cardiovascular;  Laterality: N/A;  . TEE WITHOUT CARDIOVERSION N/A 01/27/2016   Procedure: TRANSESOPHAGEAL ECHOCARDIOGRAM (TEE);  Surgeon: Rexene Alberts, MD;  Location: [place];  Service: Open Heart Surgery;  Laterality: N/A;  . TOTAL HIP ARTHROPLASTY  03/12/2012   RIGHT TOTAL HIP ARTHROPLASTY ANTERIOR APPROACH;  Surgeon: Mcarthur Rossetti, MD;  . US ECHOCARDIOGRAPHY  12/2010   EF 50-55%, mild LVH, normal wall motion, high LV filling pressures, mild AS, LA mildly dilated     Family History  Problem Relation Age of Onset  . Diabetes Mother   . Heart disease Mother   . Diabetes Father   . Heart disease Father   . Cancer Brother     throat     Social History   Social History  . Marital status: Married    Spouse name: N/A  . Number of children: N/A  . Years of education: N/A   Occupational History  .  Not on file.   Social History Main Topics  . Smoking status: Never Smoker  . Smokeless tobacco: Never Used  . Alcohol use Yes     Comment: Regular (bourbon and coke 2-3/day)  . Drug use: No  . Sexual activity: No   Other Topics Concern  . Not on file   Social History Narrative   Caffeine: coffee 1 cup/day   Lives with wife [name]), 1 cat   Occupation: retired, worked for [employer]   Edu: BS   Activity: no regular exercise   Diet: some water, fruits/vegetables  daily      Advanced directives: has living will at home. Does not want prolonged life support "no extraordinary measures" but would accept temporary measures. Would want wife to be HCPOA.      BP 120/68   Pulse 88   Ht _0  (1.88 m)   Wt 239 lb 12.8 oz (108.8 kg)   SpO2 97%   BMI 30.79 kg/m   Physical Exam:  Well appearing 76 yo man, NAD HEENT: Unremarkable Neck:  7 JVD, no thyromegally Back:  No CVA tenderness Lungs:  Scattered rales with no wheezes HEART:  IRegular rate rhythm, 2/6 AS murmur, no rubs, no clicks Abd:  soft, positive bowel sounds, no organomegally, no rebound, no guarding Ext:  2 plus pulses, trace peripheral edema, no cyanosis, no clubbing Skin:  No rashes no nodules Neuro:  CN II through XII intact, motor grossly intact  EKG - atrial fib with a controlled VR   Assess/Plan: 1. Atrial fib - he is in atrial fib on his ECG today. I suspect he now always in atrial fib and I have recommended he stop Dofetilide once he runs out of his current script. 2. Aortic stenosis - he has undergone valve replacement. He is stable.  3. Chronic diastolic heart failure - his symptoms are class 2. He is now off of his lasix and appears euvolemic. 4. Bleeding - I do not have these records but he tells me he has had bleeding behind his eye. Will continue warfarin.  Cristopher Peru, M.D.

## 2016-08-25 ENCOUNTER — Other Ambulatory Visit: Payer: Self-pay | Admitting: Cardiovascular Disease

## 2016-08-25 ENCOUNTER — Encounter (HOSPITAL_COMMUNITY)
Admission: RE | Admit: 2016-08-25 | Discharge: 2016-08-25 | Disposition: A | Discharge status: Home / Self Care | Payer: PPO | Source: Ambulatory Visit | Attending: Internal Medicine | Admitting: Internal Medicine

## 2016-08-25 ENCOUNTER — Other Ambulatory Visit: Payer: Self-pay

## 2016-08-25 DIAGNOSIS — Z952 Presence of prosthetic heart valve: Secondary | ICD-10-CM

## 2016-08-25 DIAGNOSIS — Z953 Presence of xenogenic heart valve: Secondary | ICD-10-CM | POA: Diagnosis not present

## 2016-08-25 MED ORDER — WARFARIN SODIUM 2.5 MG PO TABS: 2.5000 mg | ORAL_TABLET | Freq: Every day | ORAL | 2 refills | Status: DC

## 2016-08-25 NOTE — Telephone Encounter (Signed)
Received in basket message from [facility] who used to manage patient's coumadin.  They received request to refill his coumadin.  Patient is compliant with med management and refills X 3 months approved by this Probation officer.

## 2016-08-25 NOTE — Progress Notes (Signed)
Reviewed home exercise with pt today.  Pt plans to 2-4 days at home for exercise.  Reviewed THR, pulse, RPE, sign and symptoms, NTG use, and when to call 911 or MD.  Also discussed weather considerations and indoor options.  Pt voiced understanding.

## 2016-08-28 ENCOUNTER — Encounter (HOSPITAL_COMMUNITY)
Admission: RE | Admit: 2016-08-28 | Discharge: 2016-08-28 | Disposition: A | Discharge status: Home / Self Care | Payer: PPO | Source: Ambulatory Visit | Attending: Internal Medicine | Admitting: Internal Medicine

## 2016-08-28 DIAGNOSIS — Z952 Presence of prosthetic heart valve: Secondary | ICD-10-CM

## 2016-08-28 DIAGNOSIS — Z953 Presence of xenogenic heart valve: Secondary | ICD-10-CM | POA: Diagnosis not present

## 2016-08-28 NOTE — Telephone Encounter (Signed)
This r/x was already approved by this Probation officer on 08/25/16.  Thanks.

## 2016-08-30 ENCOUNTER — Encounter (HOSPITAL_COMMUNITY)
Admission: RE | Admit: 2016-08-30 | Discharge: 2016-08-30 | Disposition: A | Discharge status: Home / Self Care | Payer: PPO | Source: Ambulatory Visit | Attending: Internal Medicine | Admitting: Internal Medicine

## 2016-08-30 DIAGNOSIS — Z953 Presence of xenogenic heart valve: Secondary | ICD-10-CM | POA: Diagnosis not present

## 2016-08-30 DIAGNOSIS — Z952 Presence of prosthetic heart valve: Secondary | ICD-10-CM

## 2016-09-01 ENCOUNTER — Encounter (HOSPITAL_COMMUNITY)
Admission: RE | Admit: 2016-09-01 | Discharge: 2016-09-01 | Disposition: A | Discharge status: Home / Self Care | Payer: PPO | Source: Ambulatory Visit | Attending: Internal Medicine | Admitting: Internal Medicine

## 2016-09-01 DIAGNOSIS — Z952 Presence of prosthetic heart valve: Secondary | ICD-10-CM

## 2016-09-01 DIAGNOSIS — Z953 Presence of xenogenic heart valve: Secondary | ICD-10-CM | POA: Diagnosis not present

## 2016-09-01 LAB — GLUCOSE, CAPILLARY
GLUCOSE-CAPILLARY: 149 mg/dL — AB (ref 65–99)
Glucose-Capillary: 161 mg/dL — ABNORMAL HIGH (ref 65–99)

## 2016-09-04 ENCOUNTER — Encounter (HOSPITAL_COMMUNITY)
Admission: RE | Admit: 2016-09-04 | Discharge: 2016-09-04 | Disposition: A | Discharge status: Home / Self Care | Payer: PPO | Source: Ambulatory Visit | Attending: Internal Medicine | Admitting: Internal Medicine

## 2016-09-04 DIAGNOSIS — Z952 Presence of prosthetic heart valve: Secondary | ICD-10-CM

## 2016-09-04 DIAGNOSIS — Z953 Presence of xenogenic heart valve: Secondary | ICD-10-CM | POA: Diagnosis not present

## 2016-09-04 LAB — GLUCOSE, CAPILLARY: GLUCOSE-CAPILLARY: 145 mg/dL — AB (ref 65–99)

## 2016-09-05 ENCOUNTER — Other Ambulatory Visit: Payer: Self-pay | Admitting: Cardiovascular Disease

## 2016-09-06 ENCOUNTER — Encounter (HOSPITAL_COMMUNITY)
Admission: RE | Admit: 2016-09-06 | Discharge: 2016-09-06 | Disposition: A | Discharge status: Home / Self Care | Payer: PPO | Source: Ambulatory Visit | Attending: Internal Medicine | Admitting: Internal Medicine

## 2016-09-06 DIAGNOSIS — Z952 Presence of prosthetic heart valve: Secondary | ICD-10-CM

## 2016-09-06 DIAGNOSIS — Z953 Presence of xenogenic heart valve: Secondary | ICD-10-CM | POA: Diagnosis not present

## 2016-09-07 ENCOUNTER — Ambulatory Visit (INDEPENDENT_AMBULATORY_CARE_PROVIDER_SITE_OTHER): Payer: PPO

## 2016-09-07 ENCOUNTER — Other Ambulatory Visit: Payer: Self-pay | Admitting: Family Medicine

## 2016-09-07 VITALS — BP 138/74 | HR 79 | Temp 97.8°F | Ht 72.03 in | Wt 241.5 lb

## 2016-09-07 DIAGNOSIS — Z0000 Encounter for general adult medical examination without abnormal findings: Secondary | ICD-10-CM | POA: Diagnosis not present

## 2016-09-07 DIAGNOSIS — E1122 Type 2 diabetes mellitus with diabetic chronic kidney disease: Secondary | ICD-10-CM

## 2016-09-07 DIAGNOSIS — E559 Vitamin D deficiency, unspecified: Secondary | ICD-10-CM

## 2016-09-07 DIAGNOSIS — E11319 Type 2 diabetes mellitus with unspecified diabetic retinopathy without macular edema: Secondary | ICD-10-CM

## 2016-09-07 DIAGNOSIS — E785 Hyperlipidemia, unspecified: Secondary | ICD-10-CM | POA: Diagnosis not present

## 2016-09-07 DIAGNOSIS — N183 Chronic kidney disease, stage 3 (moderate): Secondary | ICD-10-CM

## 2016-09-07 DIAGNOSIS — E538 Deficiency of other specified B group vitamins: Secondary | ICD-10-CM | POA: Diagnosis not present

## 2016-09-07 LAB — COMPREHENSIVE METABOLIC PANEL
ALT: 15 U/L (ref 0–53)
AST: 25 U/L (ref 0–37)
Albumin: 3.8 g/dL (ref 3.5–5.2)
Alkaline Phosphatase: 55 U/L (ref 39–117)
BUN: 18 mg/dL (ref 6–23)
CO2: 26 mEq/L (ref 19–32)
Calcium: 9.8 mg/dL (ref 8.4–10.5)
Chloride: 103 mEq/L (ref 96–112)
Creatinine, Ser: 1.35 mg/dL (ref 0.40–1.50)
GFR: 54.35 mL/min — AB (ref >60.00)
GLUCOSE: 97 mg/dL (ref 70–99)
POTASSIUM: 4.4 meq/L (ref 3.5–5.1)
Sodium: 138 mEq/L (ref 135–145)
Total Bilirubin: 0.7 mg/dL (ref 0.2–1.2)
Total Protein: 7.3 g/dL (ref 6.0–8.3)

## 2016-09-07 LAB — MICROALBUMIN / CREATININE URINE RATIO
Creatinine,U: 159.6 mg/dL
MICROALB/CREAT RATIO: 56.4 mg/g — AB (ref 0.0–30.0)
Microalb, Ur: 90 mg/dL — ABNORMAL HIGH (ref 0.0–1.9)

## 2016-09-07 LAB — CBC WITH DIFFERENTIAL/PLATELET
BASOS ABS: 0 10*3/uL (ref 0.0–0.1)
BASOS PCT: 0.8 % (ref 0.0–3.0)
Eosinophils Absolute: 0 10*3/uL (ref 0.0–0.7)
Eosinophils Relative: 0.9 % (ref 0.0–5.0)
HEMATOCRIT: 36.3 % — AB (ref 39.0–52.0)
Hemoglobin: 11.7 g/dL — ABNORMAL LOW (ref 13.0–17.0)
LYMPHS PCT: 15.6 % (ref 12.0–46.0)
Lymphs Abs: 0.9 10*3/uL (ref 0.7–4.0)
MCHC: 32.1 g/dL (ref 30.0–36.0)
MCV: 92.2 fl (ref 78.0–100.0)
MONOS PCT: 12.6 % — AB (ref 3.0–12.0)
Monocytes Absolute: 0.7 10*3/uL (ref 0.1–1.0)
NEUTROS ABS: 4 10*3/uL (ref 1.4–7.7)
Neutrophils Relative %: 70.1 % (ref 43.0–77.0)
PLATELETS: 208 10*3/uL (ref 150.0–400.0)
RBC: 3.94 Mil/uL — ABNORMAL LOW (ref 4.22–5.81)
RDW: 17 % — AB (ref 11.5–15.5)
WBC: 5.7 10*3/uL (ref 4.0–10.5)

## 2016-09-07 LAB — LIPID PANEL
CHOL/HDL RATIO: 2
Cholesterol: 131 mg/dL (ref 0–200)
HDL: 59.9 mg/dL (ref >39.00)
LDL Cholesterol: 58 mg/dL (ref 0–99)
NONHDL: 71.13
Triglycerides: 65 mg/dL (ref 0.0–149.0)
VLDL: 13 mg/dL (ref 0.0–40.0)

## 2016-09-07 LAB — VITAMIN B12: VITAMIN B 12: 289 pg/mL (ref 211–911)

## 2016-09-07 LAB — HEMOGLOBIN A1C: Hgb A1c MFr Bld: 6.1 % (ref 4.6–6.5)

## 2016-09-07 LAB — VITAMIN D 25 HYDROXY (VIT D DEFICIENCY, FRACTURES): VITD: 34.13 ng/mL (ref 30.00–100.00)

## 2016-09-07 NOTE — Progress Notes (Signed)
Pre visit review using our clinic review tool, if applicable. No additional management support is needed unless otherwise documented below in the visit note. 

## 2016-09-07 NOTE — Patient Instructions (Addendum)
Mr. Culp , Thank you for taking time to come for your Medicare Wellness Visit. I appreciate your ongoing commitment to your health goals. Please review the following plan we discussed and let me know if I can assist you in the future.   These are the goals we discussed: Goals    . Eat more fruits and vegetables          Starting 09/08/2015, I will attempt to eat at least 2 low glycemic fruits daily.     . portion control          Starting 09/07/2016, I will continue to reduce intake of evening snacks to 1/2 cup daily in an effort to lose weight.        This is a list of the screening recommended for you and due dates:  Health Maintenance  Topic Date Due  . Complete foot exam   09/14/2016*  . DTaP/Tdap/Td vaccine (1 - Tdap) 08/22/2021*  . Flu Shot  12/20/2016  . Hemoglobin A1C  03/09/2017  . Eye exam for diabetics  08/17/2017  . Urine Protein Check  09/07/2017  . Tetanus Vaccine  08/22/2021  . Pneumonia vaccines  Completed  *Topic was postponed. The date shown is not the original due date.     Preventive Care for Adults  A healthy lifestyle and preventive care can promote health and wellness. Preventive health guidelines for adults include the following key practices.  . A routine yearly physical is a good way to check with your health care provider about your health and preventive screening. It is a chance to share any concerns and updates on your health and to receive a thorough exam.  . Visit your dentist for a routine exam and preventive care every 6 months. Brush your teeth twice a day and floss once a day. Good oral hygiene prevents tooth decay and gum disease.  . The frequency of eye exams is based on your age, health, family medical history, use  of contact lenses, and other factors. Follow your health care provider's ecommendations for frequency of eye exams.  . Eat a healthy diet. Foods like vegetables, fruits, whole grains, low-fat dairy products, and lean protein  foods contain the nutrients you need without too many calories. Decrease your intake of foods high in solid fats, added sugars, and salt. Eat the right amount of calories for you. Get information about a proper diet from your health care provider, if necessary.  . Regular physical exercise is one of the most important things you can do for your health. Most adults should get at least 150 minutes of moderate-intensity exercise (any activity that increases your heart rate and causes you to sweat) each week. In addition, most adults need muscle-strengthening exercises on 2 or more days a week.  Silver Sneakers may be a benefit available to you. To determine eligibility, you may visit the website: www.silversneakers.com or contact program at 9342662790 Mon-Fri between 8AM-8PM.   . Maintain a healthy weight. The body mass index (BMI) is a screening tool to identify possible weight problems. It provides an estimate of body fat based on height and weight. Your health care provider can find your BMI and can help you achieve or maintain a healthy weight.   For adults 20 years and older: ? A BMI below 18.5 is considered underweight. ? A BMI of 18.5 to 24.9 is normal. ? A BMI of 25 to 29.9 is considered overweight. ? A BMI of 30 and above is considered  obese.   . Maintain normal blood lipids and cholesterol levels by exercising and minimizing your intake of saturated fat. Eat a balanced diet with plenty of fruit and vegetables. Blood tests for lipids and cholesterol should begin at age 56 and be repeated every 5 years. If your lipid or cholesterol levels are high, you are over 50, or you are at high risk for heart disease, you may need your cholesterol levels checked more frequently. Ongoing high lipid and cholesterol levels should be treated with medicines if diet and exercise are not working.  . If you smoke, find out from your health care provider how to quit. If you do not use tobacco, please do not  start.  . If you choose to drink alcohol, please do not consume more than 2 drinks per day. One drink is considered to be 12 ounces (355 mL) of beer, 5 ounces (148 mL) of wine, or 1.5 ounces (44 mL) of liquor.  . If you are 14-91 years old, ask your health care provider if you should take aspirin to prevent strokes.  . Use sunscreen. Apply sunscreen liberally and repeatedly throughout the day. You should seek shade when your shadow is shorter than you. Protect yourself by wearing long sleeves, pants, a wide-brimmed hat, and sunglasses year round, whenever you are outdoors.  . Once a month, do a whole body skin exam, using a mirror to look at the skin on your back. Tell your health care provider of new moles, moles that have irregular borders, moles that are larger than a pencil eraser, or moles that have changed in shape or color.

## 2016-09-08 ENCOUNTER — Encounter (HOSPITAL_COMMUNITY)
Admission: RE | Admit: 2016-09-08 | Discharge: 2016-09-08 | Disposition: A | Discharge status: Home / Self Care | Payer: PPO | Source: Ambulatory Visit | Attending: Internal Medicine | Admitting: Internal Medicine

## 2016-09-08 DIAGNOSIS — Z952 Presence of prosthetic heart valve: Secondary | ICD-10-CM

## 2016-09-08 DIAGNOSIS — Z953 Presence of xenogenic heart valve: Secondary | ICD-10-CM | POA: Diagnosis not present

## 2016-09-08 NOTE — Telephone Encounter (Signed)
RX refilled for 2 more months due to patient being compliant with coumadin management.

## 2016-09-08 NOTE — Progress Notes (Signed)
Subjective:   Bill [name] is a 76 y.o. male who presents for Medicare Annual/Subsequent preventive examination.  Review of Systems:  N/A       Objective:    Vitals: BP 138/74 (BP Location: Right Arm, Patient Position: Sitting, Cuff Size: Normal)   Pulse 79   Temp 97.8 F (36.6 C) (Oral)   Ht 6' 0.02" (1.829 m) Comment: no shoes  Wt 241 lb 8 oz (109.5 kg)   SpO2 96%   BMI 32.73 kg/m   Body mass index is 32.73 kg/m.  Tobacco History  Smoking Status  . Never Smoker  Smokeless Tobacco  . Never Used     Counseling given: Not Answered   Past Medical History:  Diagnosis Date  . Aortic stenosis 12/2010   s/p bioprosthetic AVR 9/17 // Echo 6/17: EF 55-60, mean AV 25 mmHg // TEE 6/17: severe AS, AVA 0.93, mean AV 43 mmHg // post op Echo 02/07/16: mod conc LVH, EF 65-70, vigorous LVF, AVR ok, mean 9 mmHg, MAC, trivial MR, mod LAE, trivial pericardial eff  . Chronic diastolic CHF (congestive heart failure) ([place])   . Coronary artery disease    LHC 8/17: pLAD 20, mLAD 30, dLAD 20, mRCA 30, dRCA 20, RPDA 20  . Exogenous obesity   . GERD (gastroesophageal reflux disease)    occasional  . History of chicken pox   . Hypertension   . Kidney stones remote  . Lyme disease 2012   ?titers negative  . Neuromuscular disorder (HCC)    tremor right arm  . Persistent atrial fibrillation (HCC)    Tikosyn // s/p Maze procedure 9/17 // Anticoagulation undesirable 2/2 hx of retinal hemorrhage (worked up for [employer] but referred for Maze at time of AVR 2/2 aortic stenosis)   . PFO (patent foramen ovale)    s/p PFO closure at time of AVR in 9/17  . S/P aortic valve replacement with bioprosthetic valve 01/27/2016   27 mm [facility] Ease bovine pericardial tissue valve  . S/P Maze operation for atrial fibrillation 01/27/2016   Complete bilateral atrial lesion set using bipolar radiofrequency and cryothermy ablation with clipping of LA appendage  . Type 2 diabetes, controlled, with  retinopathy ([place]) 1980s  . Urge incontinence   . Vitamin B12 deficiency    IF normal (2014)   Past Surgical History:  Procedure Laterality Date  . AORTIC VALVE REPLACEMENT N/A 01/27/2016   Procedure: AORTIC VALVE REPLACEMENT (AVR);  Surgeon: Rexene Alberts, MD;  Location: [place];  Service: Open Heart Surgery;  Laterality: N/A;  . BACK SURGERY  20 yrs ago   removal of lower back cartilage due to damage. Patient does not remember date of surgery.  Marland Kitchen CARDIAC CATHETERIZATION N/A 01/05/2016   Procedure: Right/Left Heart Cath and Coronary Angiography;  Surgeon: Burnell Blanks, MD;  [place] [facility];  Service: Cardiovascular;  Laterality: N/A;  . CARDIOVERSION N/A 02/25/2015   Procedure: CARDIOVERSION;  Surgeon: Skeet Latch, MD;  Location: [facility] ENDOSCOPY;  Service: Cardiovascular;  Laterality: N/A;  . CATARACT EXTRACTION Bilateral 2015   [place] [facility] [place]  . COLONOSCOPY    . EYE SURGERY     left eye "cleaned"  . finger sx    . kidney stone removal    . MAZE N/A 01/27/2016   Procedure: MAZE;  Surgeon: Rexene Alberts, MD;  Location: [place];  Service: Open Heart Surgery;  Laterality: N/A;  . TEE WITHOUT CARDIOVERSION N/A 11/05/2015   Procedure: TRANSESOPHAGEAL  ECHOCARDIOGRAM (TEE);  Surgeon: Pixie Casino, MD;  Location: [place];  Service: Cardiovascular;  Laterality: N/A;  . TEE WITHOUT CARDIOVERSION N/A 01/27/2016   Procedure: TRANSESOPHAGEAL ECHOCARDIOGRAM (TEE);  Surgeon: Rexene Alberts, MD;  Location: [place];  Service: Open Heart Surgery;  Laterality: N/A;  . TOTAL HIP ARTHROPLASTY  03/12/2012   RIGHT TOTAL HIP ARTHROPLASTY ANTERIOR APPROACH;  Surgeon: Mcarthur Rossetti, MD;  . US ECHOCARDIOGRAPHY  12/2010   EF 50-55%, mild LVH, normal wall motion, high LV filling pressures, mild AS, LA mildly dilated   Family History  Problem Relation Age of Onset  . Diabetes Mother   . Heart disease Mother   . Diabetes Father   . Heart disease Father   . Cancer  Brother     throat   History  Sexual Activity  . Sexual activity: No    Outpatient Encounter Prescriptions as of 09/07/2016  Medication Sig  . acetaminophen (TYLENOL) 325 MG tablet Take 650 mg by mouth every 4 (four) hours as needed for mild pain or fever.  . cholecalciferol (VITAMIN D) 1000 UNITS tablet Take 1,000 Units by mouth as directed.   Marland Kitchen glipiZIDE (GLUCOTROL) 10 MG tablet Take 1 tablet by mouth daily with breakfast and 1/2 tablet daily with dinner.  Marland Kitchen glucose blood (ONE TOUCH ULTRA TEST) test strip Use to check sugar once daily and as needed. Dx:E11.319 **One Touch Ultra**  . Magnesium Oxide 250 MG TABS Take 1 tablet by mouth daily as needed (supplement).   . metFORMIN (GLUCOPHAGE) 500 MG tablet Take 1 tablet (500 mg total) by mouth 2 (two) times daily with a meal.  . Multiple Vitamin (MULTIVITAMIN WITH MINERALS) TABS tablet Take by mouth daily. 1 packet of 5 vitamins daily  . multivitamin-lutein (OCUVITE-LUTEIN) CAPS capsule Take 3 capsules by mouth as directed.   Marland Kitchen oxybutynin (DITROPAN) 5 MG tablet Take 1 tablet (5 mg total) by mouth 3 (three) times daily.  . vitamin B-12 (CYANOCOBALAMIN) 1000 MCG tablet Take 1,000 mcg by mouth as directed.   . warfarin (COUMADIN) 2.5 MG tablet Take 1 tablet (2.5 mg total) by mouth daily at 6 PM. Or as directed.  . pravastatin (PRAVACHOL) 20 MG tablet Take 1 tablet (20 mg total) by mouth every evening.  . [DISCONTINUED] dofetilide (TIKOSYN) 250 MCG capsule Take 1 capsule (250 mcg total) by mouth 2 (two) times daily. (Patient not taking: Reported on 09/07/2016)  . [DISCONTINUED] tiZANidine (ZANAFLEX) 2 MG tablet Take 1 to 2 tablets by mouth every 8 hours as needed for muscle spasms. (Patient not taking: Reported on 09/07/2016)   No facility-administered encounter medications on file as of 09/07/2016.     Activities of Daily Living In your present state of health, do you have any difficulty performing the following activities: 09/07/2016 02/21/2016    Hearing? N N  Vision? N N  Difficulty concentrating or making decisions? N N  Walking or climbing stairs? N N  Dressing or bathing? N N  Doing errands, shopping? N N  Preparing Food and eating ? N N  Using the Toilet? N N  In the past six months, have you accidently leaked urine? Y N  Do you have problems with loss of bowel control? N N  Managing your Medications? N N  Managing your Finances? N N  Housekeeping or managing your Housekeeping? N N  Some recent data might be hidden    Patient Care Team: [name], MD as PCP - General (Family Medicine) Thelma Comp, OD  as Consulting Physician (Optometry) Evans Lance, MD as Consulting Physician (Cardiology) Sherlynn Stalls, MD as Consulting Physician (Ophthalmology) Malcolm Metro as Consulting Physician (Dentistry)   Assessment:     Hearing Screening   125Hz  250Hz  500Hz  1000Hz  2000Hz  3000Hz  4000Hz  6000Hz  8000Hz   Right ear:   40 40 40  40    Left ear:   40 40 40  40    Vision Screening Comments: Last eye exam with March 2018 with Dr. Maryruth Hancock  Exercise Activities and Dietary recommendations Current Exercise Habits: Structured exercise class (cardio rehab), Time (Minutes): 60, Frequency (Times/Week): 3, Weekly Exercise (Minutes/Week): 180, Intensity: Moderate  Goals    . Eat more fruits and vegetables          Starting 09/08/2015, I will attempt to eat at least 2 low glycemic fruits daily.     . portion control          Starting 09/07/2016, I will continue to reduce intake of evening snacks to 1/2 cup daily in an effort to lose weight.       Fall Risk Fall Risk  09/07/2016 08/08/2016 02/21/2016 09/14/2015 09/08/2015  Falls in the past year? No No No No No  Number falls in past yr: - - - - -  Injury with Fall? - - - - -  Risk for fall due to : - Impaired balance/gait (No Data) - -  Risk for fall due to (comments): - - None - -   Depression Screen PHQ 2/9 Scores 09/07/2016 08/14/2016 02/21/2016 09/14/2015  PHQ - 2 Score 0  0 0 0    Cognitive Function MMSE - Mini Mental State Exam 09/07/2016 09/08/2015  Orientation to time 5 5  Orientation to Place 5 5  Registration 3 3  Attention/ Calculation 0 0  Recall 3 3  Language- name 2 objects 0 0  Language- repeat 1 1  Language- follow 3 step command 3 3  Language- read & follow direction 0 0  Write a sentence 0 0  Copy design 0 0  Total score 20 20     PLEASE NOTE: A Mini-Cog screen was completed. Maximum score is 20. A value of 0 denotes this part of [place] MMSE was not completed or the patient failed this part of the Mini-Cog screening.   Mini-Cog Screening Orientation to Time - Max 5 pts Orientation to Place - Max 5 pts Registration - Max 3 pts Recall - Max 3 pts Language Repeat - Max 1 pts Language Follow 3 Step Command - Max 3 pts     Immunization History  Administered Date(s) Administered  . H1N1 06/03/2008  . Influenza Split 03/16/2011, 03/01/2012  . Influenza Whole 04/23/2007, 06/09/2009, 02/02/2010  . Influenza,inj,Quad PF,36+ Mos 02/26/2013, 02/18/2014, 02/19/2015, 02/02/2016  . Pneumococcal Conjugate-13 09/03/2013  . Pneumococcal Polysaccharide-23 04/17/2006  . Td 08/23/2011  . Zoster 10/08/2013   Screening Tests Health Maintenance  Topic Date Due  . FOOT EXAM  09/14/2016 (Originally 02/19/2016)  . DTaP/Tdap/Td (1 - Tdap) 08/22/2021 (Originally 08/24/2011)  . INFLUENZA VACCINE  12/20/2016  . HEMOGLOBIN A1C  03/09/2017  . OPHTHALMOLOGY EXAM  08/17/2017  . URINE MICROALBUMIN  09/07/2017  . TETANUS/TDAP  08/22/2021  . PNA vac Low Risk Adult  Completed      Plan:     I have personally reviewed and addressed the Medicare Annual Wellness questionnaire and have noted the following in the patient's chart:  A. Medical and social history B. Use of alcohol, tobacco or illicit drugs  C. Current medications and supplements D. Functional ability and status E.  Nutritional status F.  Physical activity G. Advance directives H. List of  other physicians I.  Hospitalizations, surgeries, and ER visits in previous 12 months J.  [place] to include hearing, vision, cognitive, depression L. Referrals and appointments - none  In addition, I have reviewed and discussed with patient certain preventive protocols, quality metrics, and best practice recommendations. A written personalized care plan for preventive services as well as general preventive health recommendations were provided to patient.  See attached scanned questionnaire for additional information.   Signed,   Lindell Noe, MHA, BS, LPN Health Coach

## 2016-09-08 NOTE — Progress Notes (Signed)
PCP notes:   Health maintenance:  Foot exam - PCP will address at next appt A1C - completed Microalbumin - completed  Abnormal screenings:   None  Patient concerns:   None  Nurse concerns:  None  Next PCP appt:   09/14/16 @ 1230

## 2016-09-10 NOTE — Progress Notes (Signed)
I reviewed health advisor's note, was available for consultation, and agree with documentation and plan.  

## 2016-09-11 ENCOUNTER — Encounter (HOSPITAL_COMMUNITY)
Admission: RE | Admit: 2016-09-11 | Discharge: 2016-09-11 | Disposition: A | Discharge status: Home / Self Care | Payer: PPO | Source: Ambulatory Visit | Attending: Internal Medicine | Admitting: Internal Medicine

## 2016-09-11 DIAGNOSIS — Z953 Presence of xenogenic heart valve: Secondary | ICD-10-CM | POA: Diagnosis not present

## 2016-09-11 DIAGNOSIS — Z952 Presence of prosthetic heart valve: Secondary | ICD-10-CM

## 2016-09-13 ENCOUNTER — Encounter (HOSPITAL_COMMUNITY)
Admission: RE | Admit: 2016-09-13 | Discharge: 2016-09-13 | Disposition: A | Discharge status: Home / Self Care | Payer: PPO | Source: Ambulatory Visit | Attending: Cardiovascular Disease | Admitting: Cardiovascular Disease

## 2016-09-13 DIAGNOSIS — Z952 Presence of prosthetic heart valve: Secondary | ICD-10-CM

## 2016-09-13 DIAGNOSIS — Z953 Presence of xenogenic heart valve: Secondary | ICD-10-CM | POA: Diagnosis not present

## 2016-09-13 NOTE — Progress Notes (Signed)
Cardiac Individual Treatment Plan  Patient Details  Name: Bill [name] MRN: [id number] Date of Birth: Dec 14, 1940 Referring Provider:     CARDIAC REHAB PHASE II ORIENTATION from 08/08/2016 in [facility]  Referring Provider  Mikle Bosworth      Initial Encounter Date:    CARDIAC REHAB PHASE II ORIENTATION from 08/08/2016 in [facility]  Date  08/08/16  Referring Provider  Mikle Bosworth      Visit Diagnosis: S/P AVR  Patient's Home Medications on Admission:  Current Outpatient Prescriptions:  .  acetaminophen (TYLENOL) 325 MG tablet, Take 650 mg by mouth every 4 (four) hours as needed for mild pain or fever., Disp: , Rfl:  .  cholecalciferol (VITAMIN D) 1000 UNITS tablet, Take 1,000 Units by mouth as directed. , Disp: , Rfl:  .  glipiZIDE (GLUCOTROL) 10 MG tablet, Take 1 tablet by mouth daily with breakfast and 1/2 tablet daily with dinner., Disp: 135 tablet, Rfl: 1 .  glucose blood (ONE TOUCH ULTRA TEST) test strip, Use to check sugar once daily and as needed. Dx:E11.319 **One Touch Ultra**, Disp: 100 each, Rfl: 2 .  Magnesium Oxide 250 MG TABS, Take 1 tablet by mouth daily as needed (supplement). , Disp: , Rfl:  .  metFORMIN (GLUCOPHAGE) 500 MG tablet, Take 1 tablet (500 mg total) by mouth 2 (two) times daily with a meal., Disp: 180 tablet, Rfl: 1 .  Multiple Vitamin (MULTIVITAMIN WITH MINERALS) TABS tablet, Take by mouth daily. 1 packet of 5 vitamins daily, Disp: , Rfl:  .  multivitamin-lutein (OCUVITE-LUTEIN) CAPS capsule, Take 3 capsules by mouth as directed. , Disp: , Rfl:  .  oxybutynin (DITROPAN) 5 MG tablet, Take 1 tablet (5 mg total) by mouth 3 (three) times daily., Disp: 270 tablet, Rfl: 3 .  pravastatin (PRAVACHOL) 20 MG tablet, Take 1 tablet (20 mg total) by mouth every evening., Disp: 90 tablet, Rfl: 3 .  vitamin B-12 (CYANOCOBALAMIN) 1000 MCG tablet, Take 1,000 mcg by mouth as directed. , Disp: , Rfl:  .   warfarin (COUMADIN) 2.5 MG tablet, Take 1 tablet (2.5 mg total) by mouth daily at 6 PM. Or as directed., Disp: 35 tablet, Rfl: 2 .  warfarin (COUMADIN) 2.5 MG tablet, TAKE 1 TABLET (2.5 MG TOTAL) BY MOUTH DAILY AT 6 PM. OR AS DIRECTED., Disp: 35 tablet, Rfl: 1  Past Medical History: Past Medical History:  Diagnosis Date  . Aortic stenosis 12/2010   s/p bioprosthetic AVR 9/17 // Echo 6/17: EF 55-60, mean AV 25 mmHg // TEE 6/17: severe AS, AVA 0.93, mean AV 43 mmHg // post op Echo 02/07/16: mod conc LVH, EF 65-70, vigorous LVF, AVR ok, mean 9 mmHg, MAC, trivial MR, mod LAE, trivial pericardial eff  . Chronic diastolic CHF (congestive heart failure) ([place])   . Coronary artery disease    LHC 8/17: pLAD 20, mLAD 30, dLAD 20, mRCA 30, dRCA 20, RPDA 20  . Exogenous obesity   . GERD (gastroesophageal reflux disease)    occasional  . History of chicken pox   . Hypertension   . Kidney stones remote  . Lyme disease 2012   ?titers negative  . Neuromuscular disorder (HCC)    tremor right arm  . Persistent atrial fibrillation (HCC)    Tikosyn // s/p Maze procedure 9/17 // Anticoagulation undesirable 2/2 hx of retinal hemorrhage (worked up for [employer] but referred for Maze at time of AVR 2/2 aortic stenosis)   .  PFO (patent foramen ovale)    s/p PFO closure at time of AVR in 9/17  . S/P aortic valve replacement with bioprosthetic valve 01/27/2016   27 mm [facility] Ease bovine pericardial tissue valve  . S/P Maze operation for atrial fibrillation 01/27/2016   Complete bilateral atrial lesion set using bipolar radiofrequency and cryothermy ablation with clipping of LA appendage  . Type 2 diabetes, controlled, with retinopathy ([place]) 1980s  . Urge incontinence   . Vitamin B12 deficiency    IF normal (2014)    Tobacco Use: History  Smoking Status  . Never Smoker  Smokeless Tobacco  . Never Used    Labs: Recent Review Flowsheet Data    Labs for ITP Cardiac and Pulmonary Rehab Latest Ref Rng &  Units 01/28/2016 01/28/2016 01/28/2016 03/31/2016 09/07/2016   Cholestrol 0 - 200 mg/dL - - - 141 131   LDLCALC 0 - 99 mg/dL - - - 74 58   LDLDIRECT mg/dL - - - - -   HDL >39.00 mg/dL - - - 49.50 59.90   Trlycerides 0.0 - 149.0 mg/dL - - - 87.0 65.0   Hemoglobin A1c 4.6 - 6.5 % - - - - 6.1   PHART 7.350 - 7.450 7.294(L) 7.302(L) - - -   PCO2ART 32.0 - 48.0 mmHg 40.1 39.9 - - -   HCO3 20.0 - 28.0 mmol/L 19.3(L) 19.5(L) - - -   TCO2 0 - 100 mmol/L _0 - -   ACIDBASEDEF 0.0 - 2.0 mmol/L 7.0(H) 6.0(H) - - -   O2SAT % 98.0 96.0 - - -      Capillary Blood Glucose: Lab Results  Component Value Date   GLUCAP 145 (H) 09/04/2016   GLUCAP 161 (H) 09/01/2016   GLUCAP 149 (H) 09/01/2016   GLUCAP 91 08/23/2016   GLUCAP 159 (H) 08/18/2016     Exercise Target Goals:    Exercise Program Goal: Individual exercise prescription set with THRR, safety & activity barriers. Participant demonstrates ability to understand and report RPE using BORG scale, to self-measure pulse accurately, and to acknowledge the importance of the exercise prescription.  Exercise Prescription Goal: Starting with aerobic activity 30 plus minutes a day, 3 days per week for initial exercise prescription. Provide home exercise prescription and guidelines that participant acknowledges understanding prior to discharge.  Activity Barriers & Risk Stratification:     Activity Barriers & Cardiac Risk Stratification - 08/08/16 0923      Activity Barriers & Cardiac Risk Stratification   Activity Barriers Back Problems;Deconditioning;Muscular Weakness   Cardiac Risk Stratification High      6 Minute Walk:     6 Minute Walk    Row Name 08/08/16 1225         6 Minute Walk   Phase Initial     Distance 1221 feet     Walk Time 6 minutes     # of Rest Breaks 0     MPH 2.31     METS 2.32     RPE 12     VO2 Peak 8.15     Symptoms No     Resting HR 80 bpm     Resting BP 131/67     Max Ex. HR 103 bpm     Max Ex. BP  144/70     2 Minute Post BP 128/74        Oxygen Initial Assessment:   Oxygen Re-Evaluation:   Oxygen Discharge (Final Oxygen Re-Evaluation):   Initial  Exercise Prescription:     Initial Exercise Prescription - 08/08/16 1200      Date of Initial Exercise RX and Referring Provider   Date 08/08/16   Referring Provider Mikle Bosworth     NuStep   Level 3   SPM 60   Minutes 10   METs 2     Arm Ergometer   Level 1   Minutes 10   METs 1     Track   Laps 8   Minutes 10   METs 2.39     Prescription Details   Frequency (times per week) 3   Duration Progress to 30 minutes of continuous aerobic without signs/symptoms of physical distress     Intensity   THRR 40-80% of Max Heartrate 57-114   Ratings of Perceived Exertion 11-13   Perceived Dyspnea 0-4     Progression   Progression Continue to progress workloads to maintain intensity without signs/symptoms of physical distress.     Resistance Training   Training Prescription Yes   Weight 2lbs   Reps 10-15      Perform Capillary Blood Glucose checks as needed.  Exercise Prescription Changes:     Exercise Prescription Changes    Row Name 08/14/16 1600 08/16/16 1000 08/29/16 1600 09/11/16 1600       Response to Exercise   Blood Pressure (Admit) - 118/60 126/68 124/60    Blood Pressure (Exercise) - 146/70 140/74 148/68    Blood Pressure (Exit) - 118/62 110/60 134/70    Heart Rate (Admit) - 75 bpm 97 bpm 97 bpm    Heart Rate (Exercise) - 85 bpm 105 bpm 104 bpm    Heart Rate (Exit) - 96 bpm 83 bpm 86 bpm    Rating of Perceived Exertion (Exercise) - 11 12 -1    Symptoms - none none none    Comments  - pt completed 2 exercise sessions and tolerating exercise fairly well pt completed 2 exercise sessions and tolerating exercise fairly well pt completed 2 exercise sessions and tolerating exercise fairly well    Duration - Continue with 30 min of aerobic exercise without signs/symptoms of physical distress.  Continue with 30 min of aerobic exercise without signs/symptoms of physical distress. Continue with 30 min of aerobic exercise without signs/symptoms of physical distress.    Intensity - THRR unchanged THRR unchanged THRR unchanged      Progression   Average METs  - 2.1 2.2 2.4      Resistance Training   Training Prescription - Yes Yes Yes    Weight - 2lbs 2lbs 4lbs    Reps - 10-15 10-15 10-15    Time - 10 Minutes 10 Minutes 10 Minutes      NuStep   Level - _0 SPM - 60 70 80    Minutes - _1 METs - 2.2 2.5 2.6      Arm Ergometer   Level - _2 Minutes - _3 METs - 2.2 2.2 2.2      Track   Laps  - _4 Minutes  - _5 METs  - 2.03 2.03 2.39      Home Exercise Plan   Plans to continue exercise at  -  - Home (comment) Home (comment)    Frequency  -  - Add 2 additional days to program exercise  sessions. Add 2 additional days to program exercise sessions.    Initial Home Exercises Provided  -  - 08/25/16 08/25/16       Exercise Comments:     Exercise Comments    Row Name 08/16/16 1058 08/25/16 0932 09/12/16 1359       Exercise Comments Pt completed 2 exercise sessions in cardiac rehab. Pt is tolerating exercise fairly well; will continue to monitor pt's progress Home exercise completed Reviewed METs and goals. Pt is tolerating exercise fairly well; will continue to monitor exercise progression and activity levels        Exercise Goals and Review:     Exercise Goals    Row Name 08/08/16 0923             Exercise Goals   Increase Physical Activity Yes       Intervention Provide advice, education, support and counseling about physical activity/exercise needs.;Develop an individualized exercise prescription for aerobic and resistive training based on initial evaluation findings, risk stratification, comorbidities and participant's personal goals.       Expected Outcomes Achievement of increased cardiorespiratory fitness and  enhanced flexibility, muscular endurance and strength shown through measurements of functional capacity and personal statement of participant.       Increase Strength and Stamina Yes       Intervention Provide advice, education, support and counseling about physical activity/exercise needs.;Develop an individualized exercise prescription for aerobic and resistive training based on initial evaluation findings, risk stratification, comorbidities and participant's personal goals.       Expected Outcomes Achievement of increased cardiorespiratory fitness and enhanced flexibility, muscular endurance and strength shown through measurements of functional capacity and personal statement of participant.          Exercise Goals Re-Evaluation :     Exercise Goals Re-Evaluation    Row Name 09/12/16 1400             Exercise Goal Re-Evaluation   Exercise Goals Review Increase Physical Activity;Increase Strenth and Stamina       Comments Pt stated " balance/functional is improving, but cannot feel a difference with strength/stamina" Pt is progressing and tolerating WL increases. Pt Nustep MET average and WL has increased since start of program. Pt is making small changes and progression.       Expected Outcomes Pt will continue with 30 minutes of aerobic activity and improve in functional fitness           Discharge Exercise Prescription (Final Exercise Prescription Changes):     Exercise Prescription Changes - 09/11/16 1600      Response to Exercise   Blood Pressure (Admit) 124/60   Blood Pressure (Exercise) 148/68   Blood Pressure (Exit) 134/70   Heart Rate (Admit) 97 bpm   Heart Rate (Exercise) 104 bpm   Heart Rate (Exit) 86 bpm   Rating of Perceived Exertion (Exercise) -1   Symptoms none   Comments pt completed 2 exercise sessions and tolerating exercise fairly well   Duration Continue with 30 min of aerobic exercise without signs/symptoms of physical distress.   Intensity THRR  unchanged     Progression   Average METs 2.4     Resistance Training   Training Prescription Yes   Weight 4lbs   Reps 10-15   Time 10 Minutes     NuStep   Level 4   SPM 80   Minutes 10   METs 2.6     Arm Ergometer   Level 1   Minutes  10   METs 2.2     Track   Laps 8   Minutes 10   METs 2.39     Home Exercise Plan   Plans to continue exercise at Home (comment)   Frequency Add 2 additional days to program exercise sessions.   Initial Home Exercises Provided 08/25/16      Nutrition:  Target Goals: Understanding of nutrition guidelines, daily intake of sodium <1533m, cholesterol <2057m calories 30% from fat and 7% or less from saturated fats, daily to have 5 or more servings of fruits and vegetables.  Biometrics:     Pre Biometrics - 08/08/16 1226      Pre Biometrics   Waist Circumference 43 inches   Hip Circumference 51.25 inches   Waist to Hip Ratio 0.84 %   Triceps Skinfold 24 mm   % Body Fat 32 %   Grip Strength 35 kg   Flexibility 9.5 in   Single Leg Stand 2 seconds       Nutrition Therapy Plan and Nutrition Goals:     Nutrition Therapy & Goals - 08/21/16 0932      Nutrition Therapy   Diet Carb Modified, Therapuetic Lifestyle Changes     Personal Nutrition Goals   Nutrition Goal Wt loss of 1-2 lb/week to a wt loss goal of 6-24 lb [facility] Pt to identify and limit food sources of saturated fat, trans fat, and sodium     Intervention Plan   Intervention Prescribe, educate and counsel regarding individualized specific dietary modifications aiming towards targeted core components such as weight, hypertension, lipid management, diabetes, heart failure and other comorbidities.;Nutrition handout(s) given to patient.  Consistent vitamin K intake   Expected Outcomes Short Term Goal: Understand basic principles of dietary content, such as calories, fat, sodium, cholesterol and nutrients.;Long Term Goal:  Adherence to prescribed nutrition plan.      Nutrition Discharge: Nutrition Scores:     Nutrition Assessments - 08/21/16 0932      MEDFICTS Scores   Pre Score 89      Nutrition Goals Re-Evaluation:   Nutrition Goals Re-Evaluation:   Nutrition Goals Discharge (Final Nutrition Goals Re-Evaluation):   Psychosocial: Target Goals: Acknowledge presence or absence of significant depression and/or stress, maximize coping skills, provide positive support system. Participant is able to verbalize types and ability to use techniques and skills needed for reducing stress and depression.  Initial Review & Psychosocial Screening:     Initial Psych Review & Screening - 08/15/16 1651      Initial Review   Current issues with None Identified     Family Dynamics   Good Support System? Yes   Comments Upon brief assessment, no psychosocial needs identified, no intervention necessary.       Barriers   Psychosocial barriers to participate in program There are no identifiable barriers or psychosocial needs.     Screening Interventions   Interventions Yes   Expected Outcomes Long Term Goal: Stressors or current issues are controlled or eliminated.      Quality of Life Scores:     Quality of Life - 08/25/16 1214      Quality of Life Scores   Health/Function Pre 25.53 %   Socioeconomic Pre 26.2 %   Psych/Spiritual Pre 26.57 %   Family Pre 28.5 %   GLOBAL Pre 26.33 %  QOL scores reviewed with pt. pt demonstrates positive hopeful outlook with good coping skills. pt verbalizes this as  accurate.  pt offered emotional support and reassurance.        PHQ-9: Recent Review Flowsheet Data    Depression screen [facility] 2/9 09/07/2016 08/14/2016 02/21/2016 09/14/2015 09/08/2015   Decreased Interest 0 0 0 0 0   Down, Depressed, Hopeless 0 0 0 0 0   PHQ - 2 Score 0 0 0 0 0     Interpretation of Total Score  Total Score Depression Severity:  1-4 = Minimal depression, 5-9 = Mild depression, 10-14  = Moderate depression, 15-19 = Moderately severe depression, 20-27 = Severe depression   Psychosocial Evaluation and Intervention:     Psychosocial Evaluation - 08/15/16 1652      Psychosocial Evaluation & Interventions   Interventions Encouraged to exercise with the program and follow exercise prescription   Expected Outcomes pt will demonstrate positive outlook with good coping skills.    Continue Psychosocial Services  No Follow up required      Psychosocial Re-Evaluation:     Psychosocial Re-Evaluation    [place] Name 09/12/16 [phone]             Psychosocial Re-Evaluation   Current issues with None Identified       Comments no psychosocial concerns identified, no interventions necessary.         Expected Outcomes pt will demonstrate good coping skills wtih positive hopeful outlook.        Interventions Encouraged to attend Cardiac Rehabilitation for the exercise       Continue Psychosocial Services  No Follow up required          Psychosocial Discharge (Final Psychosocial Re-Evaluation):     Psychosocial Re-Evaluation - 09/12/16 0724      Psychosocial Re-Evaluation   Current issues with None Identified   Comments no psychosocial concerns identified, no interventions necessary.     Expected Outcomes pt will demonstrate good coping skills wtih positive hopeful outlook.    Interventions Encouraged to attend Cardiac Rehabilitation for the exercise   Continue Psychosocial Services  No Follow up required      Vocational Rehabilitation: Provide vocational rehab assistance to qualifying candidates.   Vocational Rehab Evaluation & Intervention:     Vocational Rehab - 08/08/16 1619      Initial Vocational Rehab Evaluation & Intervention   Assessment shows need for Vocational Rehabilitation No  retired      Education: Education Goals: Education classes will be provided on a weekly basis, covering required topics. Participant will state understanding/return  demonstration of topics presented.  Learning Barriers/Preferences:     Learning Barriers/Preferences - 08/08/16 0962      Learning Barriers/Preferences   Learning Barriers Sight   Learning Preferences Skilled Demonstration      Education Topics: Count Your Pulse:  -Group instruction provided by verbal instruction, demonstration, patient participation and written materials to support subject.  Instructors address importance of being able to find your pulse and how to count your pulse when at home without a heart monitor.  Patients get hands on experience counting their pulse with staff help and individually.   Heart Attack, Angina, and Risk Factor Modification:  -Group instruction provided by verbal instruction, video, and written materials to support subject.  Instructors address signs and symptoms of angina and heart attacks.    Also discuss risk factors for heart disease and how to make changes to improve heart health risk factors.   Functional Fitness:  -Group instruction provided by verbal instruction, demonstration, patient participation, and written materials to support subject.  Instructors address safety measures for doing things around the house.  Discuss how to get up and down off the floor, how to pick things up properly, how to safely get out of a chair without assistance, and balance training.   CARDIAC REHAB PHASE II EXERCISE from 09/08/2016 in [place]  Date  09/08/16  Instruction Review Code  2- meets goals/outcomes      Meditation and Mindfulness:  -Group instruction provided by verbal instruction, patient participation, and written materials to support subject.  Instructor addresses importance of mindfulness and meditation practice to help reduce stress and improve awareness.  Instructor also leads participants through a meditation exercise.    CARDIAC REHAB PHASE II EXERCISE from 09/08/2016 in [place]  Date  09/06/16  Instruction Review Code  2- meets goals/outcomes      Stretching for Flexibility and Mobility:  -Group instruction provided by verbal instruction, patient participation, and written materials to support subject.  Instructors lead participants through series of stretches that are designed to increase flexibility thus improving mobility.  These stretches are additional exercise for major muscle groups that are typically performed during regular warm up and cool down.   Hands Only CPR Anytime:  -Group instruction provided by verbal instruction, video, patient participation and written materials to support subject.  Instructors co-teach with AHA video for hands only CPR.  Participants get hands on experience with mannequins.   Nutrition I class: Heart Healthy Eating:  -Group instruction provided by PowerPoint slides, verbal discussion, and written materials to support subject matter. The instructor gives an explanation and review of the Therapeutic Lifestyle Changes diet recommendations, which includes a discussion on lipid goals, dietary fat, sodium, fiber, plant stanol/sterol esters, sugar, and the components of a well-balanced, healthy diet.   Nutrition II class: Lifestyle Skills:  -Group instruction provided by PowerPoint slides, verbal discussion, and written materials to support subject matter. The instructor gives an explanation and review of label reading, grocery shopping for heart health, heart healthy recipe modifications, and ways to make healthier choices when eating out.   Diabetes Question & Answer:  -Group instruction provided by PowerPoint slides, verbal discussion, and written materials to support subject matter. The instructor gives an explanation and review of diabetes co-morbidities, pre- and post-prandial blood glucose goals, pre-exercise blood glucose goals, signs, symptoms, and treatment of hypoglycemia and hyperglycemia, and foot care basics.    CARDIAC REHAB PHASE II EXERCISE from 09/08/2016 in [place]  Date  08/25/16  Educator  RD  Instruction Review Code  2- meets goals/outcomes      Diabetes Blitz:  -Group instruction provided by PowerPoint slides, verbal discussion, and written materials to support subject matter. The instructor gives an explanation and review of the physiology behind type 1 and type 2 diabetes, diabetes medications and rational behind using different medications, pre- and post-prandial blood glucose recommendations and Hemoglobin A1c goals, diabetes diet, and exercise including blood glucose guidelines for exercising safely.    Portion Distortion:  -Group instruction provided by PowerPoint slides, verbal discussion, written materials, and food models to support subject matter. The instructor gives an explanation of serving size versus portion size, changes in portions sizes over the last 20 years, and what consists of a serving from each food group.   Stress Management:  -Group instruction provided by verbal instruction, video, and written materials to support subject matter.  Instructors review role of stress in heart disease and how  to cope with stress positively.     Exercising on Your Own:  -Group instruction provided by verbal instruction, power point, and written materials to support subject.  Instructors discuss benefits of exercise, components of exercise, frequency and intensity of exercise, and end points for exercise.  Also discuss use of nitroglycerin and activating EMS.  Review options of places to exercise outside of rehab.  Review guidelines for sex with heart disease.   Cardiac Drugs I:  -Group instruction provided by verbal instruction and written materials to support subject.  Instructor reviews cardiac drug classes: antiplatelets, anticoagulants, beta blockers, and statins.  Instructor discusses reasons, side effects, and lifestyle considerations for each  drug class.   CARDIAC REHAB PHASE II EXERCISE from 09/08/2016 in [place]  Date  08/30/16  Educator  Kennyth Lose  Instruction Review Code  2- meets goals/outcomes      Cardiac Drugs II:  -Group instruction provided by verbal instruction and written materials to support subject.  Instructor reviews cardiac drug classes: angiotensin converting enzyme inhibitors (ACE-I), angiotensin II receptor blockers (ARBs), nitrates, and calcium channel blockers.  Instructor discusses reasons, side effects, and lifestyle considerations for each drug class.   Anatomy and Physiology of the Circulatory System:  -Group instruction provided by verbal instruction, video, and written materials to support subject.  Reviews functional anatomy of heart, how it relates to various diagnoses, and what role the heart plays in the overall system.   Knowledge Questionnaire Score:     Knowledge Questionnaire Score - 08/08/16 1234      Knowledge Questionnaire Score   Pre Score 21/24      Core Components/Risk Factors/Patient Goals at Admission:     Personal Goals and Risk Factors at Admission - 08/08/16 0924      Core Components/Risk Factors/Patient Goals on Admission    Weight Management Yes;Weight Loss;Obesity   Intervention Weight Management: Develop a combined nutrition and exercise program designed to reach desired caloric intake, while maintaining appropriate intake of nutrient and fiber, sodium and fats, and appropriate energy expenditure required for the weight goal.;Weight Management: Provide education and appropriate resources to help participant work on and attain dietary goals.;Weight Management/Obesity: Establish reasonable short term and long term weight goals.;Obesity: Provide education and appropriate resources to help participant work on and attain dietary goals.   Expected Outcomes Short Term: Continue to assess and modify interventions until short term weight is  achieved;Weight Maintenance: Understanding of the daily nutrition guidelines, which includes 25-35% calories from fat, 7% or less cal from saturated fats, less than 265m cholesterol, less than 1.5gm of sodium, & 5 or more servings of fruits and vegetables daily;Weight Loss: Understanding of general recommendations for a balanced deficit meal plan, which promotes 1-2 lb weight loss per week and includes a negative energy balance of [phone] kcal/d;Understanding recommendations for meals to include 15-35% energy as protein, 25-35% energy from fat, 35-60% energy from carbohydrates, less than 2058mof dietary cholesterol, 20-35 gm of total fiber daily;Understanding of distribution of calorie intake throughout the day with the consumption of 4-5 meals/snacks   Diabetes Yes   Intervention Provide education about signs/symptoms and action to take for hypo/hyperglycemia.;Provide education about proper nutrition, including hydration, and aerobic/resistive exercise prescription along with prescribed medications to achieve blood glucose in normal ranges: Fasting glucose 65-99 mg/dL   Expected Outcomes Short Term: Participant verbalizes understanding of the signs/symptoms and immediate care of hyper/hypoglycemia, proper foot care and importance of medication, aerobic/resistive exercise and nutrition plan for blood  glucose control.;Long Term: Attainment of HbA1C < 7%.   Hypertension Yes   Intervention Provide education on lifestyle modifcations including regular physical activity/exercise, weight management, moderate sodium restriction and increased consumption of fresh fruit, vegetables, and low fat dairy, alcohol moderation, and smoking cessation.;Monitor prescription use compliance.   Expected Outcomes Short Term: Continued assessment and intervention until BP is < 140/88m HG in hypertensive participants. < 130/840mHG in hypertensive participants with diabetes, heart failure or chronic kidney disease.;Long Term:  Maintenance of blood pressure at goal levels.   Personal Goal Other Yes   Personal Goal goal weight at 210, and to drop one pants size. Currently at 4864nd desired pants size 46   Intervention Provide exercise programming and nutrition couseling to assist with weightloss goals    Expected Outcomes Pt will lose wt and get to desired pants size of 46.      Core Components/Risk Factors/Patient Goals Review:      Goals and Risk Factor Review    Row Name 09/12/16 0722             Core Components/Risk Factors/Patient Goals Review   Personal Goals Review Weight Management/Obesity;Hypertension;Diabetes       Review pt c/o persistent dyspnea on exertion.  pt has been instructed in Pursed lip breathing techniques.  pt is continuing weight loss efforts.         Expected Outcomes pt will participate in CR activities of exercise, nutrition and life style modification education to decrease overall CAD risk factors.            Core Components/Risk Factors/Patient Goals at Discharge (Final Review):      Goals and Risk Factor Review - 09/12/16 0722      Core Components/Risk Factors/Patient Goals Review   Personal Goals Review Weight Management/Obesity;Hypertension;Diabetes   Review pt c/o persistent dyspnea on exertion.  pt has been instructed in Pursed lip breathing techniques.  pt is continuing weight loss efforts.     Expected Outcomes pt will participate in CR activities of exercise, nutrition and life style modification education to decrease overall CAD risk factors.        ITP Comments:     ITP Comments    Row Name 08/08/16 0920 09/01/16 0905         ITP Comments Dr. TrFransico HimMedical Director 09/01/16, HTN, meets goals/outcomes         Comments: Pt is making expected progress toward personal goals after completing 13 sessions. Recommend continued exercise and life style modification education including  stress management and relaxation techniques to decrease cardiac risk  profile.

## 2016-09-14 ENCOUNTER — Encounter: Payer: Self-pay | Admitting: Family Medicine

## 2016-09-14 ENCOUNTER — Ambulatory Visit (INDEPENDENT_AMBULATORY_CARE_PROVIDER_SITE_OTHER): Payer: PPO | Admitting: Family Medicine

## 2016-09-14 VITALS — BP 146/84 | HR 73 | Temp 97.7°F | Wt 237.8 lb

## 2016-09-14 DIAGNOSIS — E538 Deficiency of other specified B group vitamins: Secondary | ICD-10-CM | POA: Diagnosis not present

## 2016-09-14 DIAGNOSIS — N3941 Urge incontinence: Secondary | ICD-10-CM

## 2016-09-14 DIAGNOSIS — R251 Tremor, unspecified: Secondary | ICD-10-CM

## 2016-09-14 DIAGNOSIS — M545 Low back pain: Secondary | ICD-10-CM

## 2016-09-14 DIAGNOSIS — Z0000 Encounter for general adult medical examination without abnormal findings: Secondary | ICD-10-CM | POA: Diagnosis not present

## 2016-09-14 DIAGNOSIS — E875 Hyperkalemia: Secondary | ICD-10-CM | POA: Diagnosis not present

## 2016-09-14 DIAGNOSIS — E1122 Type 2 diabetes mellitus with diabetic chronic kidney disease: Secondary | ICD-10-CM

## 2016-09-14 DIAGNOSIS — G8929 Other chronic pain: Secondary | ICD-10-CM

## 2016-09-14 DIAGNOSIS — E559 Vitamin D deficiency, unspecified: Secondary | ICD-10-CM

## 2016-09-14 DIAGNOSIS — Z7189 Other specified counseling: Secondary | ICD-10-CM

## 2016-09-14 DIAGNOSIS — Z953 Presence of xenogenic heart valve: Secondary | ICD-10-CM

## 2016-09-14 DIAGNOSIS — E785 Hyperlipidemia, unspecified: Secondary | ICD-10-CM | POA: Diagnosis not present

## 2016-09-14 DIAGNOSIS — I2510 Atherosclerotic heart disease of native coronary artery without angina pectoris: Secondary | ICD-10-CM | POA: Diagnosis not present

## 2016-09-14 DIAGNOSIS — I10 Essential (primary) hypertension: Secondary | ICD-10-CM

## 2016-09-14 DIAGNOSIS — E669 Obesity, unspecified: Secondary | ICD-10-CM | POA: Diagnosis not present

## 2016-09-14 DIAGNOSIS — E1151 Type 2 diabetes mellitus with diabetic peripheral angiopathy without gangrene: Secondary | ICD-10-CM | POA: Insufficient documentation

## 2016-09-14 DIAGNOSIS — N183 Chronic kidney disease, stage 3 unspecified: Secondary | ICD-10-CM

## 2016-09-14 DIAGNOSIS — I4821 Permanent atrial fibrillation: Secondary | ICD-10-CM

## 2016-09-14 DIAGNOSIS — I5032 Chronic diastolic (congestive) heart failure: Secondary | ICD-10-CM

## 2016-09-14 DIAGNOSIS — E1142 Type 2 diabetes mellitus with diabetic polyneuropathy: Secondary | ICD-10-CM

## 2016-09-14 DIAGNOSIS — E11319 Type 2 diabetes mellitus with unspecified diabetic retinopathy without macular edema: Secondary | ICD-10-CM

## 2016-09-14 DIAGNOSIS — I482 Chronic atrial fibrillation: Secondary | ICD-10-CM

## 2016-09-14 MED ORDER — LISINOPRIL 2.5 MG PO TABS: 2.5000 mg | ORAL_TABLET | Freq: Every day | ORAL | 6 refills | Status: DC

## 2016-09-14 MED ORDER — PRAVASTATIN SODIUM 20 MG PO TABS: 20.0000 mg | ORAL_TABLET | Freq: Every evening | ORAL | 3 refills | Status: DC

## 2016-09-14 NOTE — Assessment & Plan Note (Signed)
Chronic, stable on pravastatin daily.

## 2016-09-14 NOTE — Progress Notes (Signed)
Pre visit review using our clinic review tool, if applicable. No additional management support is needed unless otherwise documented below in the visit note. 

## 2016-09-14 NOTE — Assessment & Plan Note (Signed)
Very mild on exam - off gabapentin.

## 2016-09-14 NOTE — Assessment & Plan Note (Signed)
Persistent R hand. No anosmia, no memory trouble, denies imbalance or stiffness.

## 2016-09-14 NOTE — Assessment & Plan Note (Signed)
Chronic, improved with cardiac rehab. Reviewed with patient. Foot exam today with evidence of LLE peripheral vascular disease and neuropathy, both diabetes related. Denies significant claudication.

## 2016-09-14 NOTE — Assessment & Plan Note (Signed)
Anticipate acute flare of chronic issue. rec continue PRN tylenol.

## 2016-09-14 NOTE — Assessment & Plan Note (Signed)
Chronic. Mildly elevated today. Off lasix. Start low dose lisinopril, monitor for hyperkalemia.

## 2016-09-14 NOTE — Assessment & Plan Note (Signed)
h/o this to ACEI in the past. Monitor closely.

## 2016-09-14 NOTE — Assessment & Plan Note (Signed)
Preventative protocols reviewed and updated unless pt declined. Discussed healthy diet and lifestyle.  

## 2016-09-14 NOTE — Assessment & Plan Note (Signed)
Failed MAZE procedure. Continue coumadin.

## 2016-09-14 NOTE — Assessment & Plan Note (Signed)
Continue vitamin D 1000 IU daily.  

## 2016-09-14 NOTE — Assessment & Plan Note (Signed)
Ongoing, stable on oxybutynin 5mg  TID. 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 No stress incontinence, no significant urge incontinence.

## 2016-09-14 NOTE — Patient Instructions (Addendum)
You are doing well today. Labs are much improved - A1c and kidney function. Continue current medicines.  Start low dose lisinopril 2.5mg  daily - sent to pharmacy - for kidney protection.  Return for 1 month lab visit only.  Return as needed or in 6 months for follow up visit.   Health Maintenance, Male A healthy lifestyle and preventive care is important for your health and wellness. Ask your health care provider about what schedule of regular examinations is right for you. What should I know about weight and diet?  Eat a Healthy Diet  Eat plenty of vegetables, fruits, whole grains, low-fat dairy products, and lean protein.  Do not eat a lot of foods high in solid fats, added sugars, or salt. Maintain a Healthy Weight  Regular exercise can help you achieve or maintain a healthy weight. You should:  Do at least 150 minutes of exercise each week. The exercise should increase your heart rate and make you sweat (moderate-intensity exercise).  Do strength-training exercises at least twice a week. Watch Your Levels of Cholesterol and Blood Lipids  Have your blood tested for lipids and cholesterol every 5 years starting at 78 years of age. If you are at high risk for heart disease, you should start having your blood tested when you are 78 years old. You may need to have your cholesterol levels checked more often if:  Your lipid or cholesterol levels are high.  You are older than 78 years of age.  You are at high risk for heart disease. What should I know about cancer screening? Many types of cancers can be detected early and may often be prevented. Lung Cancer  You should be screened every year for lung cancer if:  You are a current smoker who has smoked for at least 30 years.  You are a former smoker who has quit within the past 15 years.  Talk to your health care provider about your screening options, when you should start screening, and how often you should be screened. Colorectal  Cancer  Routine colorectal cancer screening usually begins at 78 years of age and should be repeated every 5-10 years until you are 78 years old. You may need to be screened more often if early forms of precancerous polyps or small growths are found. Your health care provider may recommend screening at an earlier age if you have risk factors for colon cancer.  Your health care provider may recommend using home test kits to check for hidden blood in the stool.  A small camera at the end of a tube can be used to examine your colon (sigmoidoscopy or colonoscopy). This checks for the earliest forms of colorectal cancer. Prostate and Testicular Cancer  Depending on your age and overall health, your health care provider may do certain tests to screen for prostate and testicular cancer.  Talk to your health care provider about any symptoms or concerns you have about testicular or prostate cancer. Skin Cancer  Check your skin from head to toe regularly.  Tell your health care provider about any new moles or changes in moles, especially if:  There is a change in a mole's size, shape, or color.  You have a mole that is larger than a pencil eraser.  Always use sunscreen. Apply sunscreen liberally and repeat throughout the day.  Protect yourself by wearing long sleeves, pants, a wide-brimmed hat, and sunglasses when outside. What should I know about heart disease, diabetes, and high blood pressure?  If  you are 39-69 years of age, have your blood pressure checked every 3-5 years. If you are 7 years of age or older, have your blood pressure checked every year. You should have your blood pressure measured twice-once when you are at a hospital or clinic, and once when you are not at a hospital or clinic. Record the average of the two measurements. To check your blood pressure when you are not at a hospital or clinic, you can use:  An automated blood pressure machine at a pharmacy.  A home blood  pressure monitor.  Talk to your health care provider about your target blood pressure.  If you are between 80-66 years old, ask your health care provider if you should take aspirin to prevent heart disease.  Have regular diabetes screenings by checking your fasting blood sugar level.  If you are at a normal weight and have a low risk for diabetes, have this test once every three years after the age of 17.  If you are overweight and have a high risk for diabetes, consider being tested at a younger age or more often.  A one-time screening for abdominal aortic aneurysm (AAA) by ultrasound is recommended for men aged 15-75 years who are current or former smokers. What should I know about preventing infection? Hepatitis B  If you have a higher risk for hepatitis B, you should be screened for this virus. Talk with your health care provider to find out if you are at risk for hepatitis B infection. Hepatitis C  Blood testing is recommended for:  Everyone born from [birth year] through 1967.  Anyone with known risk factors for hepatitis C. Sexually Transmitted Diseases (STDs)  You should be screened each year for STDs including gonorrhea and chlamydia if:  You are sexually active and are younger than 78 years of age.  You are older than 78 years of age and your health care provider tells you that you are at risk for this type of infection.  Your sexual activity has changed since you were last screened and you are at an increased risk for chlamydia or gonorrhea. Ask your health care provider if you are at risk.  Talk with your health care provider about whether you are at high risk of being infected with HIV. Your health care provider may recommend a prescription medicine to help prevent HIV infection. What else can I do?  Schedule regular health, dental, and eye exams.  Stay current with your vaccines (immunizations).  Do not use any tobacco products, such as cigarettes, chewing tobacco, and  e-cigarettes. If you need help quitting, ask your health care provider.  Limit alcohol intake to no more than 2 drinks per day. One drink equals 12 ounces of beer, 5 ounces of wine, or 1 ounces of hard liquor.  Do not use street drugs.  Do not share needles.  Ask your health care provider for help if you need support or information about quitting drugs.  Tell your health care provider if you often feel depressed.  Tell your health care provider if you have ever been abused or do not feel safe at home. This information is not intended to replace advice given to you by your health care provider. Make sure you discuss any questions you have with your health care provider. Document Released: 11/04/2007 Document Revised: 01/05/2016 Document Reviewed: 02/09/2015 Elsevier Interactive Patient Education  2017 [employer].

## 2016-09-14 NOTE — Assessment & Plan Note (Addendum)
Scanned into chart 12/2015 - HCPOA are [place] Perkey then Earley Abide. Does not want life prolonging measures if prolonged unconscious.

## 2016-09-14 NOTE — Assessment & Plan Note (Signed)
Not regular with oral B12. Prior on shots. Encouraged restart regular oral replacement.

## 2016-09-14 NOTE — Progress Notes (Signed)
BP (!) 146/84   Pulse 73   Temp 97.7 F (36.5 C) (Oral)   Wt 237 lb 12 oz (107.8 kg)   SpO2 96%   BMI 32.22 kg/m    CC: CPE Subjective:    Patient ID: Bill [name], male    DOB: 10-06-1940, 76 y.o.   MRN: [id number]  HPI: Bill [name] is a 76 y.o. male presenting on 09/14/2016 for Annual Exam   Saw Katha Cabal last week for medicare wellness visit. Note reviewed.    Regularly sees Dr Lovena Le and Dr [name] s/p bioprosthetic AVR, diastolic CHF, persistent afib despite MAZE procedure.   Ongoing R back pain over last few weeks without inciting trauma/injury. Has tried tylenol for this without significant improvement. Denies hematuria.   Urinary incontinence with ongoing accidents - uses pad daily. Oxybutynin 5mg  TID helping. No stress incontinence. No significant urgency - feels inability to control bladder. Nocturia x 2 at night. Has not seen urologist in the past.   Preventative: Colonoscopy 2002, told normal. Cologuard 09/2015 normal.  Prostate screening - always normal. Denies nocturia, hesitancy or changes in stream. Aged out. Flu shot yearly  Pneumonia shot 2007. Prevnar 08/2013 Td 2013  zostavax - 09/2013. Shingrix - not interested  Advanced directives: Scanned into chart 12/2015 - HCPOA are Elih Mooney then Earley Abide. Does not want life prolonging measures if prolonged unconscious. Seat belt use discussed Sunscreen use discussed. No changing moles on skin. Non smoker Alcohol - 2-3 mixed drinks a night  Caffeine: coffee 1 cup/day Lives with wife [name]), 1 cat Occupation: retired, worked for [employer]: BS Activity: no regular exercise, gardening Diet: some water, fruits/vegetables daily  Relevant past medical, surgical, family and social history reviewed and updated as indicated. Interim medical history since our last visit reviewed. Allergies and medications reviewed and updated. Outpatient Medications Prior to Visit  Medication Sig Dispense Refill  .  acetaminophen (TYLENOL) 325 MG tablet Take 650 mg by mouth every 4 (four) hours as needed for mild pain or fever.    . cholecalciferol (VITAMIN D) 1000 UNITS tablet Take 1,000 Units by mouth as directed.     Marland Kitchen glipiZIDE (GLUCOTROL) 10 MG tablet Take 1 tablet by mouth daily with breakfast and 1/2 tablet daily with dinner. 135 tablet 1  . glucose blood (ONE TOUCH ULTRA TEST) test strip Use to check sugar once daily and as needed. Dx:E11.319 **One Touch Ultra** 100 each 2  . Magnesium Oxide 250 MG TABS Take 1 tablet by mouth daily as needed (supplement).     . metFORMIN (GLUCOPHAGE) 500 MG tablet Take 1 tablet (500 mg total) by mouth 2 (two) times daily with a meal. 180 tablet 1  . Multiple Vitamin (MULTIVITAMIN WITH MINERALS) TABS tablet Take by mouth daily. 1 packet of 5 vitamins daily    . multivitamin-lutein (OCUVITE-LUTEIN) CAPS capsule Take 3 capsules by mouth as directed.     Marland Kitchen oxybutynin (DITROPAN) 5 MG tablet Take 1 tablet (5 mg total) by mouth 3 (three) times daily. 270 tablet 3  . vitamin B-12 (CYANOCOBALAMIN) 1000 MCG tablet Take 1,000 mcg by mouth as directed.     . warfarin (COUMADIN) 2.5 MG tablet Take 1 tablet (2.5 mg total) by mouth daily at 6 PM. Or as directed. 35 tablet 2  . warfarin (COUMADIN) 2.5 MG tablet TAKE 1 TABLET (2.5 MG TOTAL) BY MOUTH DAILY AT 6 PM. OR AS DIRECTED. 35 tablet 1  . pravastatin (PRAVACHOL) 20 MG tablet Take 1 tablet (  20 mg total) by mouth every evening. 90 tablet 3   No facility-administered medications prior to visit.      Per HPI unless specifically indicated in ROS section below Review of Systems  Constitutional: Negative for activity change, appetite change, chills, fatigue, fever and unexpected weight change.  HENT: Negative for hearing loss.   Eyes: Negative for visual disturbance.  Respiratory: Positive for shortness of breath (occasional). Negative for cough, chest tightness and wheezing.   Cardiovascular: Negative for chest pain, palpitations  and leg swelling.  Gastrointestinal: Negative for abdominal distention, abdominal pain, blood in stool, constipation, diarrhea, nausea and vomiting.  Genitourinary: Negative for difficulty urinating and hematuria.  Musculoskeletal: Negative for arthralgias, myalgias and neck pain.  Skin: Negative for rash.  Neurological: Negative for dizziness, seizures, syncope and headaches.  Hematological: Negative for adenopathy. Bruises/bleeds easily.  Psychiatric/Behavioral: Negative for dysphoric mood. The patient is not nervous/anxious.        Objective:    BP (!) 146/84   Pulse 73   Temp 97.7 F (36.5 C) (Oral)   Wt 237 lb 12 oz (107.8 kg)   SpO2 96%   BMI 32.22 kg/m   Wt Readings from Last 3 Encounters:  09/14/16 237 lb 12 oz (107.8 kg)  09/07/16 241 lb 8 oz (109.5 kg)  08/24/16 239 lb 12.8 oz (108.8 kg)    Physical Exam  Constitutional: He is oriented to person, place, and time. He appears well-developed and well-nourished. No distress.  HENT:  Head: Normocephalic and atraumatic.  Right Ear: Hearing, tympanic membrane, external ear and ear canal normal.  Left Ear: Hearing, tympanic membrane, external ear and ear canal normal.  Nose: Nose normal.  Mouth/Throat: Uvula is midline, oropharynx is clear and moist and mucous membranes are normal. No oropharyngeal exudate, posterior oropharyngeal edema or posterior oropharyngeal erythema.  Eyes: Conjunctivae and EOM are normal. Pupils are equal, round, and reactive to light. No scleral icterus.  Neck: Normal range of motion. Neck supple. Carotid bruit is not present. No thyromegaly present.  Cardiovascular: Normal rate, regular rhythm, normal heart sounds and intact distal pulses.   No murmur heard. Pulses:      Radial pulses are 2+ on the right side, and 2+ on the left side.  Pulmonary/Chest: Effort normal and breath sounds normal. No respiratory distress. He has no wheezes. He has no rales.  Abdominal: Soft. Bowel sounds are normal. He  exhibits no distension and no mass. There is no tenderness. There is no rebound and no guarding.  Musculoskeletal: Normal range of motion. He exhibits edema.  See foot exam below No pain midline spine + R upper lumbar paraspinous mm tightness without pain. No pain at SIJ, GTB or sciatic notch bilaterally.   Lymphadenopathy:    He has no cervical adenopathy.  Neurological: He is alert and oriented to person, place, and time.  CN grossly intact, station and gait intact Mild masked fascies No cogwheel rigidity Slowed gait  Skin: Skin is warm and dry. No rash noted.  Psychiatric: He has a normal mood and affect. His behavior is normal. Judgment and thought content normal.  Nursing note and vitals reviewed.  Results for orders placed or performed in visit on 09/07/16  Vitamin B12  Result Value Ref Range   Vitamin B-12 289 211 - 911 pg/mL  VITAMIN D 25 Hydroxy (Vit-D Deficiency, Fractures)  Result Value Ref Range   VITD 34.13 30.00 - 100.00 ng/mL  Lipid panel  Result Value Ref Range   Cholesterol 131  0 - 200 mg/dL   Triglycerides 65.0 0.0 - 149.0 mg/dL   HDL 59.90 >39.00 mg/dL   VLDL 13.0 0.0 - 40.0 mg/dL   LDL Cholesterol 58 0 - 99 mg/dL   Total CHOL/HDL Ratio 2    NonHDL 71.13   Comprehensive metabolic panel  Result Value Ref Range   Sodium 138 135 - 145 mEq/L   Potassium 4.4 3.5 - 5.1 mEq/L   Chloride 103 96 - 112 mEq/L   CO2 26 19 - 32 mEq/L   Glucose, Bld 97 70 - 99 mg/dL   BUN 18 6 - 23 mg/dL   Creatinine, Ser 1.35 0.40 - 1.50 mg/dL   Total Bilirubin 0.7 0.2 - 1.2 mg/dL   Alkaline Phosphatase 55 39 - 117 U/L   AST 25 0 - 37 U/L   ALT 15 0 - 53 U/L   Total Protein 7.3 6.0 - 8.3 g/dL   Albumin 3.8 3.5 - 5.2 g/dL   Calcium 9.8 8.4 - 10.5 mg/dL   GFR 54.35 (L) >60.00 mL/min  Hemoglobin A1c  Result Value Ref Range   Hgb A1c MFr Bld 6.1 4.6 - 6.5 %  CBC with Differential/Platelet  Result Value Ref Range   WBC 5.7 4.0 - 10.5 K/uL   RBC 3.94 (L) 4.22 - 5.81 Mil/uL    Hemoglobin 11.7 (L) 13.0 - 17.0 g/dL   HCT 36.3 (L) 39.0 - 52.0 %   MCV 92.2 78.0 - 100.0 fl   MCHC 32.1 30.0 - 36.0 g/dL   RDW 17.0 (H) 11.5 - 15.5 %   Platelets 208.0 150.0 - 400.0 K/uL   Neutrophils Relative % 70.1 43.0 - 77.0 %   Lymphocytes Relative 15.6 12.0 - 46.0 %   Monocytes Relative 12.6 (H) 3.0 - 12.0 %   Eosinophils Relative 0.9 0.0 - 5.0 %   Basophils Relative 0.8 0.0 - 3.0 %   Neutro Abs 4.0 1.4 - 7.7 K/uL   Lymphs Abs 0.9 0.7 - 4.0 K/uL   Monocytes Absolute 0.7 0.1 - 1.0 K/uL   Eosinophils Absolute 0.0 0.0 - 0.7 K/uL   Basophils Absolute 0.0 0.0 - 0.1 K/uL  Microalbumin / creatinine urine ratio  Result Value Ref Range   Microalb, Ur 90.0 (H) 0.0 - 1.9 mg/dL   Creatinine,U 159.6 mg/dL   Microalb Creat Ratio 56.4 (H) 0.0 - 30.0 mg/g      Diabetic Foot Exam - Simple   Simple Foot Form Diabetic Foot exam was performed with the following findings:  Yes 09/14/2016  1:34 PM  Visual Inspection No deformities, no ulcerations, no other skin breakdown bilaterally:  Yes Sensation Testing See comments:  Yes Pulse Check See comments:  Yes Comments 1+ pedal edema 2+ DP on right, 1+ DP on left Diminished sensation to monofilament L sole     Assessment & Plan:   Problem List Items Addressed This Visit    Advanced care planning/counseling discussion    Scanned into chart 12/2015 - HCPOA are Willeen Cass then Earley Abide. Does not want life prolonging measures if prolonged unconscious.      Atrial fibrillation (HCC) (Chronic)    Failed MAZE procedure. Continue coumadin.       Relevant Medications   pravastatin (PRAVACHOL) 20 MG tablet   lisinopril (ZESTRIL) 2.5 MG tablet   Chronic diastolic heart failure (HCC)    Chronic, stable. Continue current regimen.       Relevant Medications   pravastatin (PRAVACHOL) 20 MG tablet   lisinopril (ZESTRIL) 2.5  MG tablet   Chronic lower back pain (Chronic)    Anticipate acute flare of chronic issue. rec continue PRN  tylenol.      CKD stage 3 due to type 2 diabetes mellitus ([place])    Actually improved readings recently.  Will retrial lisinopril at 2.5mg  daily. RTC 1 mo f/u labs.       Relevant Medications   pravastatin (PRAVACHOL) 20 MG tablet   lisinopril (ZESTRIL) 2.5 MG tablet   Other Relevant Orders   Renal function panel   Coronary artery disease involving native coronary artery of native heart without angina pectoris   Relevant Medications   pravastatin (PRAVACHOL) 20 MG tablet   lisinopril (ZESTRIL) 2.5 MG tablet   Diabetic peripheral neuropathy associated with type 2 diabetes mellitus ([place])    Very mild on exam - off gabapentin.       Relevant Medications   pravastatin (PRAVACHOL) 20 MG tablet   lisinopril (ZESTRIL) 2.5 MG tablet   Diabetic peripheral vascular disease (HCC)    Evidence of this on exam - diminished LLE pulses. Pt denies claudication. Discussed ABI/arterial evaluation - pt declines at this time.       Relevant Medications   pravastatin (PRAVACHOL) 20 MG tablet   lisinopril (ZESTRIL) 2.5 MG tablet   Essential hypertension (Chronic)    Chronic. Mildly elevated today. Off lasix. Start low dose lisinopril, monitor for hyperkalemia.      Relevant Medications   pravastatin (PRAVACHOL) 20 MG tablet   lisinopril (ZESTRIL) 2.5 MG tablet   Health maintenance examination - Primary    Preventative protocols reviewed and updated unless pt declined. Discussed healthy diet and lifestyle.       HLD (hyperlipidemia) (Chronic)    Chronic, stable on pravastatin daily.       Relevant Medications   pravastatin (PRAVACHOL) 20 MG tablet   lisinopril (ZESTRIL) 2.5 MG tablet   Hyperkalemia    h/o this to ACEI in the past. Monitor closely.      Obesity, Class I, BMI 30-34.9    Encouraged healthy diet and lifestyle changes. Encouraged he continue cardiac rehab       S/P aortic valve replacement with bioprosthetic valve + maze procedure   Tremor    Persistent R hand. No  anosmia, no memory trouble, denies imbalance or stiffness.       Type 2 diabetes, controlled, with retinopathy ([place]) (Chronic)    Chronic, improved with cardiac rehab. Reviewed with patient. Foot exam today with evidence of LLE peripheral vascular disease and neuropathy, both diabetes related. Denies significant claudication.       Relevant Medications   pravastatin (PRAVACHOL) 20 MG tablet   lisinopril (ZESTRIL) 2.5 MG tablet   Urge incontinence    Ongoing, stable on oxybutynin 5mg  TID. 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 No stress incontinence, no significant urge incontinence.       Vitamin B12 deficiency    Not regular with oral B12. Prior on shots. Encouraged restart regular oral replacement.       Vitamin D deficiency (Chronic)    Continue vitamin D 1000 IU daily.           Follow up plan: Return in about 6 months (around 03/16/2017) for follow up visit.  [name], MD

## 2016-09-14 NOTE — Assessment & Plan Note (Signed)
Evidence of this on exam - diminished LLE pulses. Pt denies claudication. Discussed ABI/arterial evaluation - pt declines at this time.

## 2016-09-14 NOTE — Assessment & Plan Note (Signed)
Encouraged healthy diet and lifestyle changes. Encouraged he continue cardiac rehab

## 2016-09-14 NOTE — Assessment & Plan Note (Signed)
Actually improved readings recently.  Will retrial lisinopril at 2.5mg  daily. RTC 1 mo f/u labs.

## 2016-09-14 NOTE — Assessment & Plan Note (Signed)
Chronic, stable. Continue current regimen. 

## 2016-09-15 ENCOUNTER — Encounter (HOSPITAL_COMMUNITY)
Admission: RE | Admit: 2016-09-15 | Discharge: 2016-09-15 | Disposition: A | Discharge status: Home / Self Care | Payer: PPO | Source: Ambulatory Visit | Attending: Internal Medicine | Admitting: Internal Medicine

## 2016-09-15 DIAGNOSIS — Z953 Presence of xenogenic heart valve: Secondary | ICD-10-CM | POA: Diagnosis not present

## 2016-09-15 DIAGNOSIS — Z952 Presence of prosthetic heart valve: Secondary | ICD-10-CM

## 2016-09-18 ENCOUNTER — Encounter: Payer: Self-pay | Admitting: Family Medicine

## 2016-09-18 ENCOUNTER — Encounter (HOSPITAL_COMMUNITY)
Admission: RE | Admit: 2016-09-18 | Discharge: 2016-09-18 | Disposition: A | Discharge status: Home / Self Care | Payer: PPO | Source: Ambulatory Visit | Attending: Cardiovascular Disease | Admitting: Cardiovascular Disease

## 2016-09-18 DIAGNOSIS — Z952 Presence of prosthetic heart valve: Secondary | ICD-10-CM

## 2016-09-18 DIAGNOSIS — Z953 Presence of xenogenic heart valve: Secondary | ICD-10-CM | POA: Diagnosis not present

## 2016-09-18 MED ORDER — OXYBUTYNIN CHLORIDE 5 MG PO TABS: 5.0000 mg | ORAL_TABLET | Freq: Three times a day (TID) | ORAL | 3 refills | Status: DC

## 2016-09-18 NOTE — Telephone Encounter (Signed)
Last Rx 09/15/14 for 33yr. Last OV 09/14/16-CPE. pls advise

## 2016-09-20 ENCOUNTER — Encounter (HOSPITAL_COMMUNITY)
Admission: RE | Admit: 2016-09-20 | Discharge: 2016-09-20 | Disposition: A | Discharge status: Home / Self Care | Payer: PPO | Source: Ambulatory Visit | Attending: Internal Medicine | Admitting: Internal Medicine

## 2016-09-20 DIAGNOSIS — Z9889 Other specified postprocedural states: Secondary | ICD-10-CM | POA: Insufficient documentation

## 2016-09-20 DIAGNOSIS — Z8679 Personal history of other diseases of the circulatory system: Secondary | ICD-10-CM | POA: Insufficient documentation

## 2016-09-20 DIAGNOSIS — Z953 Presence of xenogenic heart valve: Secondary | ICD-10-CM | POA: Diagnosis not present

## 2016-09-20 DIAGNOSIS — Z952 Presence of prosthetic heart valve: Secondary | ICD-10-CM

## 2016-09-22 ENCOUNTER — Encounter (HOSPITAL_COMMUNITY)
Admission: RE | Admit: 2016-09-22 | Discharge: 2016-09-22 | Disposition: A | Discharge status: Home / Self Care | Payer: PPO | Source: Ambulatory Visit | Attending: Internal Medicine | Admitting: Internal Medicine

## 2016-09-22 ENCOUNTER — Ambulatory Visit (INDEPENDENT_AMBULATORY_CARE_PROVIDER_SITE_OTHER): Payer: PPO

## 2016-09-22 DIAGNOSIS — Z953 Presence of xenogenic heart valve: Secondary | ICD-10-CM | POA: Diagnosis not present

## 2016-09-22 DIAGNOSIS — Z952 Presence of prosthetic heart valve: Secondary | ICD-10-CM

## 2016-09-22 DIAGNOSIS — Z5181 Encounter for therapeutic drug level monitoring: Secondary | ICD-10-CM

## 2016-09-22 LAB — POCT INR: INR: 1.7

## 2016-09-22 NOTE — Patient Instructions (Signed)
Pre visit review using our clinic review tool, if applicable. No additional management support is needed unless otherwise documented below in the visit note.  INR today 1.7  Patient reports taking in increased amounts of V8 juice every other day.  Otherwise, his health and medication regimen has remained the same.  This likely has contributed to subtherapeutic level and patient educated on affects from the V8 and encouraged to consume only in limited amounts.    Patient is to take 1.5pills (3.75mg ) today 5/4 for a boost and the continue taking 1 pill (2.5mg ) daily EXCEPT for 1/2 pill (1.25mg ) on Sundays and Thursdays.  Recheck in 3 weeks.  Patient verbalizes understanding of all instructions given today and risks associated with subtherapeutic level, he will go to ER if any concerns develop.

## 2016-09-25 ENCOUNTER — Encounter (HOSPITAL_COMMUNITY)
Admission: RE | Admit: 2016-09-25 | Discharge: 2016-09-25 | Disposition: A | Discharge status: Home / Self Care | Payer: PPO | Source: Ambulatory Visit | Attending: Internal Medicine | Admitting: Internal Medicine

## 2016-09-25 DIAGNOSIS — Z953 Presence of xenogenic heart valve: Secondary | ICD-10-CM | POA: Diagnosis not present

## 2016-09-25 DIAGNOSIS — Z952 Presence of prosthetic heart valve: Secondary | ICD-10-CM

## 2016-09-25 NOTE — Progress Notes (Signed)
Pt with tick bite to his left upper arm.  Pt thought it was a deer tick.  Pt felt he got it all out.  Hard area noted around the bite site about a quarter size.  Pt advised to call his primary MD for follow up. Asked if pt would like for rehab staff to call and schedule, pt declined offer. Cherre Huger, BSN Cardiac and Training and development officer

## 2016-09-26 ENCOUNTER — Encounter: Payer: Self-pay | Admitting: Family Medicine

## 2016-09-26 ENCOUNTER — Ambulatory Visit (INDEPENDENT_AMBULATORY_CARE_PROVIDER_SITE_OTHER): Payer: PPO | Admitting: Family Medicine

## 2016-09-26 VITALS — BP 154/90 | HR 78 | Temp 98.0°F | Wt 242.8 lb

## 2016-09-26 DIAGNOSIS — I10 Essential (primary) hypertension: Secondary | ICD-10-CM

## 2016-09-26 DIAGNOSIS — W57XXXA Bitten or stung by nonvenomous insect and other nonvenomous arthropods, initial encounter: Secondary | ICD-10-CM

## 2016-09-26 DIAGNOSIS — E875 Hyperkalemia: Secondary | ICD-10-CM | POA: Diagnosis not present

## 2016-09-26 LAB — BASIC METABOLIC PANEL
BUN: 20 mg/dL (ref 6–23)
CALCIUM: 10.4 mg/dL (ref 8.4–10.5)
CO2: 26 mEq/L (ref 19–32)
CREATININE: 1.27 mg/dL (ref 0.40–1.50)
Chloride: 104 mEq/L (ref 96–112)
GFR: 58.31 mL/min — AB (ref >60.00)
GLUCOSE: 62 mg/dL — AB (ref 70–99)
Potassium: 4.3 mEq/L (ref 3.5–5.1)
SODIUM: 137 meq/L (ref 135–145)

## 2016-09-26 NOTE — Assessment & Plan Note (Signed)
Tick bite with residual local reaction. rec triple abx ointment during day and then cortisone-10 at night (when most pruritic). No signs of lyme disease or other tick borne illness. Discussed symptoms to monitor for over the next 10 days.

## 2016-09-26 NOTE — Patient Instructions (Addendum)
Labs today - recheck potassium. May cancel visit for 5/29  You have local reaction to tick bite - no lyme disease.  For next 7-10 days watch for any fever/chills, nausea, joint pains, new rash or headache.  May treat itchy rash with cortisone-10 OTC cream at night and may use antibiotic ointment during the day. Let us know if not improving as expected.   Tick Bite Information, Adult Ticks are insects that draw blood for food. Most ticks live in shrubs and grassy areas. They climb onto people and animals that brush against the leaves and grasses that they rest on. Then they bite, attaching themselves to the skin. Most ticks are harmless, but some ticks carry germs that can spread to a person through a bite and cause a disease. To reduce your risk of getting a disease from a tick bite, it is important to take steps to prevent tick bites. It is also important to check for ticks after being outdoors. If you find that a tick has attached to you, watch for symptoms of disease. How can I prevent tick bites? Take these steps to help prevent tick bites when you are outdoors in an area where ticks are found:  Use insect repellent that has DEET (20% or higher), picaridin, or IR3535 in it. Use it on:  Skin that is showing.  The top of your boots.  Your pant legs.  Your sleeve cuffs.  For repellent products that contain permethrin, follow product instructions. Use these products on:  Clothing.  Gear.  Boots.  Tents.  Wear protective clothing. Long sleeves and long pants offer the best protection from ticks.  Wear light-colored clothing so you can see ticks more easily.  Tuck your pant legs into your socks.  If you go walking on a trail, stay in the middle of the trail so your skin, hair, and clothing do not touch the bushes.  Avoid walking through areas with long grass.  Check for ticks on your clothing, hair, and skin often while you are outside, and check again before you go inside.  Make sure to check the places that ticks attach themselves most often. These places include the scalp, neck, armpits, waist, groin, and joint areas. Ticks that carry a disease called Lyme disease have to be attached to the skin for 24-48 hours. Checking for ticks every day will lessen your risk of this and other diseases.  When you come indoors, wash your clothes and take a shower or a bath right away. Dry your clothes in a dryer on high heat for at least 60 minutes. This will kill any ticks in your clothes. What is the proper way to remove a tick? If you find a tick on your body, remove it as soon as possible. Removing a tick sooner rather than later can prevent germs from passing from the tick to your body. To remove a tick that is crawling on your skin but has not bitten:  Go outdoors and brush the tick off.  Remove the tick with tape or a lint roller. To remove a tick that is attached to your skin:  Wash your hands.  If you have latex gloves, put them on.  Use tweezers, curved forceps, or a tick-removal tool to gently grasp the tick as close to your skin and the tick's head as possible.  Gently pull with steady, upward pressure until the tick lets go. When removing the tick:  Take care to keep the tick's head attached to its  body.  Do not twist or jerk the tick. This can make the tick's head or mouth break off.  Do not squeeze or crush the tick's body. This could force disease-carrying fluids from the tick into your body. Do not try to remove a tick with heat, alcohol, petroleum jelly, or fingernail polish. Using these methods can cause the tick to salivate and regurgitate into your bloodstream, increasing your risk of getting a disease. What should I do after removing a tick?  Clean the bite area with soap and water, rubbing alcohol, or an iodine scrub.  If an antiseptic cream or ointment is available, apply a small amount to the bite site.  Wash and disinfect any instruments that  you used to remove the tick. How should I dispose of a tick? To dispose of a live tick, use one of these methods:  Place it in rubbing alcohol.  Place it in a sealed bag or container.  Wrap it tightly in tape.  Flush it down the toilet. Contact a health care provider if:  You have symptoms of a disease after a tick bite. Symptoms of a tick-borne disease can occur from moments after the tick bites to up to 30 days after a tick is removed. Symptoms include:  Muscle, joint, or bone pain.  Difficulty walking or moving your legs.  Numbness in the legs.  Paralysis.  Red rash around the tick bite area that is shaped like a target or a "bull's-eye."  Redness and swelling in the area of the tick bite.  Fever.  Repeated vomiting.  Diarrhea.  Weight loss.  Tender, swollen lymph glands.  Shortness of breath.  Cough.  Pain in the abdomen.  Headache.  Abnormal tiredness.  A change in your level of consciousness.  Confusion. Get help right away if:  You are not able to remove a tick.  A part of a tick breaks off and gets stuck in your skin.  Your symptoms get worse. Summary  Ticks may carry germs that can spread to a person through a bite and cause disease.  Wear protective clothing and use insect repellent to prevent tick bites. Follow product instructions.  If you find a tick on your body, remove it as soon as possible. If the tick is attached, do not try to remove with heat, alcohol, petroleum jelly, or fingernail polish.  Remove the attached tick using tweezers, curved forceps, or a tick-removal tool. Gently pull with steady, upward pressure until the tick lets go. Do not twist or jerk the tick. Do not squeeze or crush the tick's body.  If you have symptoms after being bitten by a tick, contact a health care provider. This information is not intended to replace advice given to you by your health care provider. Make sure you discuss any questions you have with  your health care provider. Document Released: 05/05/2000 Document Revised: 02/18/2016 Document Reviewed: 02/18/2016 Elsevier Interactive Patient Education  2017 [employer].

## 2016-09-26 NOTE — Progress Notes (Signed)
BP (!) 154/90   Pulse 78   Temp 98 F (36.7 C) (Oral)   Wt 242 lb 12 oz (110.1 kg)   SpO2 96%   BMI 32.90 kg/m    CC: tick bite Subjective:    Patient ID: Bill [name], male    DOB: [birth year], 76 y.o.   MRN: [id number]  HPI: Bill [name] is a 76 y.o. male presenting on 09/26/2016 for Insect Bite (tick on 5/5)   Saturday morning at 3am found tick on right lateral arm, removed in its entirety. Remaining itchy. No pain or tender.      BP Readings from Last 3 Encounters:  09/26/16 (!) 154/90  09/14/16 (!) 146/84  09/07/16 138/74    Relevant past medical, surgical, family and social history reviewed and updated as indicated. Interim medical history since our last visit reviewed. Allergies and medications reviewed and updated. Outpatient Medications Prior to Visit  Medication Sig Dispense Refill  . acetaminophen (TYLENOL) 325 MG tablet Take 650 mg by mouth every 4 (four) hours as needed for mild pain or fever.    . cholecalciferol (VITAMIN D) 1000 UNITS tablet Take 1,000 Units by mouth as directed.     Marland Kitchen glipiZIDE (GLUCOTROL) 10 MG tablet Take 1 tablet by mouth daily with breakfast and 1/2 tablet daily with dinner. 135 tablet 1  . glucose blood (ONE TOUCH ULTRA TEST) test strip Use to check sugar once daily and as needed. Dx:E11.319 **One Touch Ultra** 100 each 2  . lisinopril (ZESTRIL) 2.5 MG tablet Take 1 tablet (2.5 mg total) by mouth daily. 30 tablet 6  . Magnesium Oxide 250 MG TABS Take 1 tablet by mouth daily as needed (supplement).     . metFORMIN (GLUCOPHAGE) 500 MG tablet Take 1 tablet (500 mg total) by mouth 2 (two) times daily with a meal. 180 tablet 1  . Multiple Vitamin (MULTIVITAMIN WITH MINERALS) TABS tablet Take by mouth daily. 1 packet of 5 vitamins daily    . multivitamin-lutein (OCUVITE-LUTEIN) CAPS capsule Take 3 capsules by mouth as directed.     Marland Kitchen oxybutynin (DITROPAN) 5 MG tablet Take 1 tablet (5 mg total) by mouth 3 (three) times daily. 270 tablet 3    . pravastatin (PRAVACHOL) 20 MG tablet Take 1 tablet (20 mg total) by mouth every evening. 90 tablet 3  . vitamin B-12 (CYANOCOBALAMIN) 1000 MCG tablet Take 1,000 mcg by mouth as directed.     . warfarin (COUMADIN) 2.5 MG tablet Take 1 tablet (2.5 mg total) by mouth daily at 6 PM. Or as directed. 35 tablet 2  . warfarin (COUMADIN) 2.5 MG tablet TAKE 1 TABLET (2.5 MG TOTAL) BY MOUTH DAILY AT 6 PM. OR AS DIRECTED. 35 tablet 1   No facility-administered medications prior to visit.      Per HPI unless specifically indicated in ROS section below Review of Systems     Objective:    BP (!) 154/90   Pulse 78   Temp 98 F (36.7 C) (Oral)   Wt 242 lb 12 oz (110.1 kg)   SpO2 96%   BMI 32.90 kg/m   Wt Readings from Last 3 Encounters:  09/26/16 242 lb 12 oz (110.1 kg)  09/14/16 237 lb 12 oz (107.8 kg)  09/07/16 241 lb 8 oz (109.5 kg)    Physical Exam  Constitutional: He appears well-developed and well-nourished. No distress.  Musculoskeletal: He exhibits no edema.  Skin: Skin is warm and dry. No rash noted. No erythema.  Central tick bite right lateral posterior upper arm without residual tick part. Mild surrounding pruritis with erythema in wheal around site of bite.   Nursing note and vitals reviewed.  Results for orders placed or performed in visit on 09/22/16  POCT INR  Result Value Ref Range   INR 1.7    Lab Results  Component Value Date   CREATININE 1.35 09/07/2016    Lab Results  Component Value Date   K 4.4 09/07/2016       Assessment & Plan:   Problem List Items Addressed This Visit    Essential hypertension (Chronic)    BP remaining elevated despite recent addition of lisinopril 2.5mg  daily - given h/o hyperkalemia, will check Cr /K today. Has been on lisinopril over last 10 days.       Relevant Orders   Basic metabolic panel   Hyperkalemia    Update BMP today.       Tick bite - Primary    Tick bite with residual local reaction. rec triple abx ointment  during day and then cortisone-10 at night (when most pruritic). No signs of lyme disease or other tick borne illness. Discussed symptoms to monitor for over the next 10 days.          Follow up plan: Return if symptoms worsen or fail to improve.  [name], MD

## 2016-09-26 NOTE — Assessment & Plan Note (Signed)
BP remaining elevated despite recent addition of lisinopril 2.5mg  daily - given h/o hyperkalemia, will check Cr /K today. Has been on lisinopril over last 10 days.

## 2016-09-26 NOTE — Assessment & Plan Note (Signed)
Update BMP today.  

## 2016-09-26 NOTE — Progress Notes (Signed)
Pre visit review using our clinic review tool, if applicable. No additional management support is needed unless otherwise documented below in the visit note. 

## 2016-09-27 ENCOUNTER — Encounter (HOSPITAL_COMMUNITY)
Admission: RE | Admit: 2016-09-27 | Discharge: 2016-09-27 | Disposition: A | Discharge status: Home / Self Care | Payer: PPO | Source: Ambulatory Visit | Attending: Internal Medicine | Admitting: Internal Medicine

## 2016-09-27 DIAGNOSIS — Z953 Presence of xenogenic heart valve: Secondary | ICD-10-CM | POA: Diagnosis not present

## 2016-09-27 DIAGNOSIS — Z952 Presence of prosthetic heart valve: Secondary | ICD-10-CM

## 2016-09-27 NOTE — Progress Notes (Signed)
Pt seen in the office by primary MD .  No further treatment warranted. Cherre Huger, BSN Cardiac and Training and development officer

## 2016-09-29 ENCOUNTER — Encounter (HOSPITAL_COMMUNITY)
Admission: RE | Admit: 2016-09-29 | Discharge: 2016-09-29 | Disposition: A | Discharge status: Home / Self Care | Payer: PPO | Source: Ambulatory Visit | Attending: Internal Medicine | Admitting: Internal Medicine

## 2016-09-29 DIAGNOSIS — Z953 Presence of xenogenic heart valve: Secondary | ICD-10-CM | POA: Diagnosis not present

## 2016-09-29 DIAGNOSIS — Z952 Presence of prosthetic heart valve: Secondary | ICD-10-CM

## 2016-10-02 ENCOUNTER — Encounter (HOSPITAL_COMMUNITY)
Admission: RE | Admit: 2016-10-02 | Discharge: 2016-10-02 | Disposition: A | Discharge status: Home / Self Care | Payer: PPO | Source: Ambulatory Visit | Attending: Internal Medicine | Admitting: Internal Medicine

## 2016-10-02 ENCOUNTER — Telehealth: Payer: Self-pay | Admitting: Family Medicine

## 2016-10-02 ENCOUNTER — Telehealth (HOSPITAL_COMMUNITY): Payer: Self-pay | Admitting: Cardiac Rehabilitation

## 2016-10-02 DIAGNOSIS — Z953 Presence of xenogenic heart valve: Secondary | ICD-10-CM | POA: Diagnosis not present

## 2016-10-02 DIAGNOSIS — Z952 Presence of prosthetic heart valve: Secondary | ICD-10-CM

## 2016-10-02 LAB — GLUCOSE, CAPILLARY
Glucose-Capillary: 76 mg/dL (ref 65–99)
Glucose-Capillary: 99 mg/dL (ref 65–99)

## 2016-10-02 MED ORDER — GLIPIZIDE 5 MG PO TABS: 5.0000 mg | ORAL_TABLET | Freq: Every day | ORAL | 3 refills | Status: DC

## 2016-10-02 NOTE — Telephone Encounter (Signed)
-----   Message from [name], MD sent at 10/02/2016  2:04 PM EDT ----- Regarding: RE: cardiac rehab  Thanks for the message. I recommend Ed decrease glipizide to 5mg  daily in the morning with breakfast, and stop night time dose. I have sent 5mg  dose to his pharmacy. I will also have my staff call to discuss this with him. Garlon Hatchet  ----- Message ----- From: Lowell Guitar, RN Sent: 10/02/2016   9:08 AM To: [name], MD Subject: cardiac rehab                                  Dear Dr. Danise Mina,  [place] reported home hypoglycemia this morning prior to cardiac rehab.  CBG- 63 upon awakening. Pt asymptomatic, denies change in routine.  Pt CBG-76 at cardiac rehab.  Pt did not exercise per protocol which states CBG >90  for exercise. Pt reports he has noticed lower than usual fasting CBG over past week (70s).   Would it be reasonable for pt to hold AM glucotrol or metformin on cardiac rehab exercise days or decrease AM dosage?   Please advise.   Thank you, Andi Hence, RN, BSN Cardiac Pulmonary Rehab

## 2016-10-02 NOTE — Telephone Encounter (Signed)
Patient notified as instructed by telephone and verbalized understanding. Patient stated that he will call back in 2-3 weeks with readings.

## 2016-10-02 NOTE — Telephone Encounter (Signed)
-----   Message from Lowell Guitar, [place] sent at 10/02/2016  9:08 AM EDT ----- Regarding: cardiac rehab  Dear Dr. Danise Mina,  Pt reported home hypoglycemia this morning prior to cardiac rehab.  CBG- 63 upon awakening. Pt asymptomatic, denies change in routine.  Pt CBG-76 at cardiac rehab.  Pt did not exercise per protocol which states CBG >90  for exercise. Pt reports he has noticed lower than usual fasting CBG over past week (70s).   Would it be reasonable for pt to hold AM glucotrol or metformin on cardiac rehab exercise days or decrease AM dosage?   Please advise.   Thank you, Andi Hence, RN, BSN Cardiac Pulmonary Rehab

## 2016-10-02 NOTE — Telephone Encounter (Signed)
Plz call pt - I received message his sugars have been running lower than normal with some hypoglycemic.  I recommend we decrease glipizide to 5mg  daily in the morning (and none at night).  I have sent new dose to pharmacy. Update [place] with cbg readings in 2-3 weeks.  Let us know if any questions.

## 2016-10-04 ENCOUNTER — Encounter (HOSPITAL_COMMUNITY)
Admission: RE | Admit: 2016-10-04 | Discharge: 2016-10-04 | Disposition: A | Discharge status: Home / Self Care | Payer: PPO | Source: Ambulatory Visit | Attending: Internal Medicine | Admitting: Internal Medicine

## 2016-10-04 DIAGNOSIS — Z952 Presence of prosthetic heart valve: Secondary | ICD-10-CM

## 2016-10-04 DIAGNOSIS — Z953 Presence of xenogenic heart valve: Secondary | ICD-10-CM | POA: Diagnosis not present

## 2016-10-04 LAB — GLUCOSE, CAPILLARY: GLUCOSE-CAPILLARY: 157 mg/dL — AB (ref 65–99)

## 2016-10-04 NOTE — Progress Notes (Signed)
Daily Session Note  Patient Details  Name: Bill [name] MRN: [id number] Date of Birth: 08-May-1941 Referring Provider:     CARDIAC REHAB PHASE II ORIENTATION from 08/08/2016 in [facility]  Referring Provider  Mikle Bosworth      Encounter Date: 10/04/2016  Check In:     Session Check In - 10/04/16 0936      Check-In   Location [facility]   Staff Present Cleda Mccreedy, MS, Exercise Physiologist;Amber Fair, MS, ACSM RCEP, Exercise Physiologist;Joann Rion, RN, Marga Melnick, RN, BSN   Supervising physician immediately available to respond to emergencies [facility] immediately available   Physician(s) Dr Broadus John   Medication changes reported     No   Fall or balance concerns reported    No   Tobacco Cessation No Change   Warm-up and Cool-down Performed as group-led instruction   Resistance Training Performed No   VAD Patient? No     Pain Assessment   Currently in Pain? No/denies      Capillary Blood Glucose: Results for orders placed or performed during the hospital encounter of 10/04/16 (from the past 24 hour(s))  Glucose, capillary     Status: Abnormal   Collection Time: 10/04/16  9:33 AM  Result Value Ref Range   Glucose-Capillary 157 (H) 65 - 99 mg/dL      History  Smoking Status  . Never Smoker  Smokeless Tobacco  . Never Used    Goals Met:  Exercise tolerated well  Goals Unmet:  Not Applicable  Comments: pt arrived at cardiac rehab reporting medication change. Per Dr. Lynnae Sandhoff, pt is taking glucotrol 44m aQM only.  PM dose has been discontinued.  Fasting CBG-173 this am. Med list reconciled.   Will continue to monitor.    Dr. TFransico Himis [facility] Director [facility]

## 2016-10-06 ENCOUNTER — Encounter (HOSPITAL_COMMUNITY)
Admission: RE | Admit: 2016-10-06 | Discharge: 2016-10-06 | Disposition: A | Discharge status: Home / Self Care | Payer: PPO | Source: Ambulatory Visit | Attending: Internal Medicine | Admitting: Internal Medicine

## 2016-10-06 DIAGNOSIS — Z953 Presence of xenogenic heart valve: Secondary | ICD-10-CM | POA: Diagnosis not present

## 2016-10-06 DIAGNOSIS — Z952 Presence of prosthetic heart valve: Secondary | ICD-10-CM

## 2016-10-09 ENCOUNTER — Encounter (HOSPITAL_COMMUNITY)
Admission: RE | Admit: 2016-10-09 | Discharge: 2016-10-09 | Disposition: A | Discharge status: Home / Self Care | Payer: PPO | Source: Ambulatory Visit | Attending: Internal Medicine | Admitting: Internal Medicine

## 2016-10-09 DIAGNOSIS — Z952 Presence of prosthetic heart valve: Secondary | ICD-10-CM

## 2016-10-09 DIAGNOSIS — Z953 Presence of xenogenic heart valve: Secondary | ICD-10-CM | POA: Diagnosis not present

## 2016-10-11 ENCOUNTER — Encounter (HOSPITAL_COMMUNITY)
Admission: RE | Admit: 2016-10-11 | Discharge: 2016-10-11 | Disposition: A | Discharge status: Home / Self Care | Payer: PPO | Source: Ambulatory Visit | Attending: Internal Medicine | Admitting: Internal Medicine

## 2016-10-11 DIAGNOSIS — Z952 Presence of prosthetic heart valve: Secondary | ICD-10-CM

## 2016-10-11 DIAGNOSIS — Z953 Presence of xenogenic heart valve: Secondary | ICD-10-CM | POA: Diagnosis not present

## 2016-10-11 LAB — GLUCOSE, CAPILLARY: Glucose-Capillary: 143 mg/dL — ABNORMAL HIGH (ref 65–99)

## 2016-10-11 NOTE — Progress Notes (Signed)
Cardiac Individual Treatment Plan  Patient Details  Name: Bill [name] MRN: [id number] Date of Birth: 03-26-1941 Referring Provider:     CARDIAC REHAB PHASE II ORIENTATION from 08/08/2016 in [place]  Referring Provider  Mikle Bosworth      Initial Encounter Date:    CARDIAC REHAB PHASE II ORIENTATION from 08/08/2016 in [place]  Date  08/08/16  Referring Provider  Mikle Bosworth      Visit Diagnosis: S/P AVR  Patient's Home Medications on Admission:  Current Outpatient Prescriptions:  .  acetaminophen (TYLENOL) 325 MG tablet, Take 650 mg by mouth every 4 (four) hours as needed for mild pain or fever., Disp: , Rfl:  .  cholecalciferol (VITAMIN D) 1000 UNITS tablet, Take 1,000 Units by mouth as directed. , Disp: , Rfl:  .  glipiZIDE (GLUCOTROL) 5 MG tablet, Take 1 tablet (5 mg total) by mouth daily before breakfast., Disp: 90 tablet, Rfl: 3 .  glucose blood (ONE TOUCH ULTRA TEST) test strip, Use to check sugar once daily and as needed. Dx:E11.319 **One Touch Ultra**, Disp: 100 each, Rfl: 2 .  lisinopril (ZESTRIL) 2.5 MG tablet, Take 1 tablet (2.5 mg total) by mouth daily., Disp: 30 tablet, Rfl: 6 .  Magnesium Oxide 250 MG TABS, Take 1 tablet by mouth daily as needed (supplement). , Disp: , Rfl:  .  metFORMIN (GLUCOPHAGE) 500 MG tablet, Take 1 tablet (500 mg total) by mouth 2 (two) times daily with a meal., Disp: 180 tablet, Rfl: 1 .  Multiple Vitamin (MULTIVITAMIN WITH MINERALS) TABS tablet, Take by mouth daily. 1 packet of 5 vitamins daily, Disp: , Rfl:  .  multivitamin-lutein (OCUVITE-LUTEIN) CAPS capsule, Take 3 capsules by mouth as directed. , Disp: , Rfl:  .  oxybutynin (DITROPAN) 5 MG tablet, Take 1 tablet (5 mg total) by mouth 3 (three) times daily., Disp: 270 tablet, Rfl: 3 .  pravastatin (PRAVACHOL) 20 MG tablet, Take 1 tablet (20 mg total) by mouth every evening., Disp: 90 tablet, Rfl: 3 .  vitamin B-12  (CYANOCOBALAMIN) 1000 MCG tablet, Take 1,000 mcg by mouth as directed. , Disp: , Rfl:  .  warfarin (COUMADIN) 2.5 MG tablet, Take 1 tablet (2.5 mg total) by mouth daily at 6 PM. Or as directed., Disp: 35 tablet, Rfl: 2 .  warfarin (COUMADIN) 2.5 MG tablet, TAKE 1 TABLET (2.5 MG TOTAL) BY MOUTH DAILY AT 6 PM. OR AS DIRECTED., Disp: 35 tablet, Rfl: 1  Past Medical History: Past Medical History:  Diagnosis Date  . Aortic stenosis 12/2010   s/p bioprosthetic AVR 9/17 // Echo 6/17: EF 55-60, mean AV 25 mmHg // TEE 6/17: severe AS, AVA 0.93, mean AV 43 mmHg // post op Echo 02/07/16: mod conc LVH, EF 65-70, vigorous LVF, AVR ok, mean 9 mmHg, MAC, trivial MR, mod LAE, trivial pericardial eff  . Chronic diastolic CHF (congestive heart failure) ([place])   . Coronary artery disease    LHC 8/17: pLAD 20, mLAD 30, dLAD 20, mRCA 30, dRCA 20, RPDA 20  . Exogenous obesity   . GERD (gastroesophageal reflux disease)    occasional  . History of chicken pox   . Hypertension   . Kidney stones remote  . Lyme disease 2012   ?titers negative  . Neuromuscular disorder (HCC)    tremor right arm  . Persistent atrial fibrillation ([place])    Tikosyn // s/p Maze procedure 9/17 // Anticoagulation undesirable 2/2 hx of  retinal hemorrhage (worked up for [employer] but referred for Maze at time of AVR 2/2 aortic stenosis)   . PFO (patent foramen ovale)    s/p PFO closure at time of AVR in 9/17  . S/P aortic valve replacement with bioprosthetic valve 01/27/2016   27 mm [facility] Ease bovine pericardial tissue valve  . S/P Maze operation for atrial fibrillation 01/27/2016   Complete bilateral atrial lesion set using bipolar radiofrequency and cryothermy ablation with clipping of LA appendage  . Type 2 diabetes, controlled, with retinopathy ([place]) 1980s  . Urge incontinence   . Vitamin B12 deficiency    IF normal (2014)    Tobacco Use: History  Smoking Status  . Never Smoker  Smokeless Tobacco  . Never Used     Labs: Recent Review Flowsheet Data    Labs for ITP Cardiac and Pulmonary Rehab Latest Ref Rng & Units 01/28/2016 01/28/2016 01/28/2016 03/31/2016 09/07/2016   Cholestrol 0 - 200 mg/dL - - - 141 131   LDLCALC 0 - 99 mg/dL - - - 74 58   LDLDIRECT mg/dL - - - - -   HDL >39.00 mg/dL - - - 49.50 59.90   Trlycerides 0.0 - 149.0 mg/dL - - - 87.0 65.0   Hemoglobin A1c 4.6 - 6.5 % - - - - 6.1   PHART 7.350 - 7.450 7.294(L) 7.302(L) - - -   PCO2ART 32.0 - 48.0 mmHg 40.1 39.9 - - -   HCO3 20.0 - 28.0 mmol/L 19.3(L) 19.5(L) - - -   TCO2 0 - 100 mmol/L _0 - -   ACIDBASEDEF 0.0 - 2.0 mmol/L 7.0(H) 6.0(H) - - -   O2SAT % 98.0 96.0 - - -      Capillary Blood Glucose: Lab Results  Component Value Date   GLUCAP 143 (H) 10/11/2016   GLUCAP 157 (H) 10/04/2016   GLUCAP 99 10/02/2016   GLUCAP 76 10/02/2016   GLUCAP 145 (H) 09/04/2016     Exercise Target Goals:    Exercise Program Goal: Individual exercise prescription set with THRR, safety & activity barriers. Participant demonstrates ability to understand and report RPE using BORG scale, to self-measure pulse accurately, and to acknowledge the importance of the exercise prescription.  Exercise Prescription Goal: Starting with aerobic activity 30 plus minutes a day, 3 days per week for initial exercise prescription. Provide home exercise prescription and guidelines that participant acknowledges understanding prior to discharge.  Activity Barriers & Risk Stratification:     Activity Barriers & Cardiac Risk Stratification - 08/08/16 0923      Activity Barriers & Cardiac Risk Stratification   Activity Barriers Back Problems;Deconditioning;Muscular Weakness   Cardiac Risk Stratification High      6 Minute Walk:     6 Minute Walk    Row Name 08/08/16 1225         6 Minute Walk   Phase Initial     Distance 1221 feet     Walk Time 6 minutes     # of Rest Breaks 0     MPH 2.31     METS 2.32     RPE 12     VO2 Peak 8.15      Symptoms No     Resting HR 80 bpm     Resting BP 131/67     Max Ex. HR 103 bpm     Max Ex. BP 144/70     2 Minute Post BP 128/74  Oxygen Initial Assessment:   Oxygen Re-Evaluation:   Oxygen Discharge (Final Oxygen Re-Evaluation):   Initial Exercise Prescription:     Initial Exercise Prescription - 08/08/16 1200      Date of Initial Exercise RX and Referring Provider   Date 08/08/16   Referring Provider Mikle Bosworth     NuStep   Level 3   SPM 60   Minutes 10   METs 2     Arm Ergometer   Level 1   Minutes 10   METs 1     Track   Laps 8   Minutes 10   METs 2.39     Prescription Details   Frequency (times per week) 3   Duration Progress to 30 minutes of continuous aerobic without signs/symptoms of physical distress     Intensity   THRR 40-80% of Max Heartrate 57-114   Ratings of Perceived Exertion 11-13   Perceived Dyspnea 0-4     Progression   Progression Continue to progress workloads to maintain intensity without signs/symptoms of physical distress.     Resistance Training   Training Prescription Yes   Weight 2lbs   Reps 10-15      Perform Capillary Blood Glucose checks as needed.  Exercise Prescription Changes:     Exercise Prescription Changes    Row Name 08/14/16 1600 08/16/16 1000 08/29/16 1600 09/11/16 1038 09/11/16 1600     Response to Exercise   Blood Pressure (Admit) - 118/60 126/68 124/60 124/60   Blood Pressure (Exercise) - 146/70 140/74 148/68 148/68   Blood Pressure (Exit) - 118/62 110/60 134/70 134/70   Heart Rate (Admit) - 75 bpm 97 bpm 97 bpm 97 bpm   Heart Rate (Exercise) - 85 bpm 105 bpm 104 bpm 104 bpm   Heart Rate (Exit) - 96 bpm 83 bpm 86 bpm 86 bpm   Rating of Perceived Exertion (Exercise) - _0 -1   Symptoms - none none none none   Comments  - pt completed 2 exercise sessions and tolerating exercise fairly well pt completed 2 exercise sessions and tolerating exercise fairly well pt completed 2 exercise  sessions and tolerated exercise fairly well pt completed 2 exercise sessions and tolerating exercise fairly well   Duration - Continue with 30 min of aerobic exercise without signs/symptoms of physical distress. Continue with 30 min of aerobic exercise without signs/symptoms of physical distress. Continue with 30 min of aerobic exercise without signs/symptoms of physical distress. Continue with 30 min of aerobic exercise without signs/symptoms of physical distress.   Intensity - THRR unchanged THRR unchanged THRR unchanged THRR unchanged     Progression   Average METs  - 2.1 2.2 2.4 2.4     Resistance Training   Training Prescription - Yes Yes Yes Yes   Weight - 2lbs 2lbs 4lbs 4lbs   Reps - 10-15 10-15 10-15 10-15   Time - 10 Minutes 10 Minutes 10 Minutes 10 Minutes     NuStep   Level - _1 SPM - 60 70 80 80   Minutes - _2 METs - 2.2 2.5 2.6 2.6     Arm Ergometer   Level - _3 Minutes - _4 METs - 2.2 2.2 2.2 2.2     Track   Laps  - _5 Minutes  - _6 METs  -  2.03 2.03 2.39 2.39     Home Exercise Plan   Plans to continue exercise at  -  - Home (comment) Home (comment) Home (comment)   Frequency  -  - Add 2 additional days to program exercise sessions. Add 2 additional days to program exercise sessions. Add 2 additional days to program exercise sessions.   Initial Home Exercises Provided  -  - 08/25/16 08/25/16 08/25/16   Row Name 09/25/16 1000 10/10/16 1600           Response to Exercise   Blood Pressure (Admit) 142/78 108/60      Blood Pressure (Exercise) 156/78 142/60      Blood Pressure (Exit) 130/60 118/62      Heart Rate (Admit) 75 bpm 922 bpm      Heart Rate (Exercise) 81 bpm 93 bpm      Heart Rate (Exit) 75 bpm 70 bpm      Rating of Perceived Exertion (Exercise) 11 11      Symptoms none none      Comments -  -      Duration Continue with 30 min of aerobic exercise without signs/symptoms of physical distress.  Continue with 30 min of aerobic exercise without signs/symptoms of physical distress.      Intensity THRR unchanged THRR unchanged        Progression   Progression  - Continue to progress workloads to maintain intensity without signs/symptoms of physical distress.      Average METs 2.1 2.7        Resistance Training   Training Prescription Yes Yes      Weight 4lbs 4lbs      Reps 10-15 10-15      Time 10 Minutes 10 Minutes        NuStep   Level 4 5      SPM 80 90      Minutes 10 10      METs 2.7 3        Arm Ergometer   Level 1 1      Minutes 10 10      METs 1.43 2.2        Track   Laps 6 11      Minutes 10 10      METs 2.03 2.74        Home Exercise Plan   Plans to continue exercise at Home (comment) Home (comment)      Frequency Add 2 additional days to program exercise sessions. Add 2 additional days to program exercise sessions.      Initial Home Exercises Provided 08/25/16 08/25/16         Exercise Comments:     Exercise Comments    Row Name 08/16/16 1058 08/25/16 0932 09/12/16 1359 10/04/16 1521     Exercise Comments Pt completed 2 exercise sessions in cardiac rehab. Pt is tolerating exercise fairly well; will continue to monitor pt's progress Home exercise completed Reviewed METs and goals. Pt is tolerating exercise fairly well; will continue to monitor exercise progression and activity levels Reviewed METs and goals. Pt is tolerating exercise fairly well; will continue to monitor exercise progression and activity levels       Exercise Goals and Review:     Exercise Goals    Row Name 08/08/16 0923             Exercise Goals   Increase Physical Activity Yes       Intervention Provide advice, education,  support and counseling about physical activity/exercise needs.;Develop an individualized exercise prescription for aerobic and resistive training based on initial evaluation findings, risk stratification, comorbidities and participant's personal goals.        Expected Outcomes Achievement of increased cardiorespiratory fitness and enhanced flexibility, muscular endurance and strength shown through measurements of functional capacity and personal statement of participant.       Increase Strength and Stamina Yes       Intervention Provide advice, education, support and counseling about physical activity/exercise needs.;Develop an individualized exercise prescription for aerobic and resistive training based on initial evaluation findings, risk stratification, comorbidities and participant's personal goals.       Expected Outcomes Achievement of increased cardiorespiratory fitness and enhanced flexibility, muscular endurance and strength shown through measurements of functional capacity and personal statement of participant.          Exercise Goals Re-Evaluation :     Exercise Goals Re-Evaluation    [place] Name 09/12/16 1400 10/04/16 1519           Exercise Goal Re-Evaluation   Exercise Goals Review Increase Physical Activity;Increase Strenth and Stamina Increase Physical Activity;Increase Strenth and Stamina      Comments Pt stated " balance/functional is improving, but cannot feel a difference with strength/stamina" Pt is progressing and tolerating WL increases. Pt Nustep MET average and WL has increased since start of program. Pt is making small changes and progression.  Pt is progressing and tolerating WL increases. Pt Nustep MET average has increased, Pt is making small changes and improved balance progression.      Expected Outcomes Pt will continue with 30 minutes of aerobic activity and improve in functional fitness Pt will continue with 30 minutes of aerobic activity and improve in functional mobility and balance          Discharge Exercise Prescription (Final Exercise Prescription Changes):     Exercise Prescription Changes - 10/10/16 1600      Response to Exercise   Blood Pressure (Admit) 108/60   Blood Pressure (Exercise) 142/60    Blood Pressure (Exit) 118/62   Heart Rate (Admit) 922 bpm   Heart Rate (Exercise) 93 bpm   Heart Rate (Exit) 70 bpm   Rating of Perceived Exertion (Exercise) 11   Symptoms none   Duration Continue with 30 min of aerobic exercise without signs/symptoms of physical distress.   Intensity THRR unchanged     Progression   Progression Continue to progress workloads to maintain intensity without signs/symptoms of physical distress.   Average METs 2.7     Resistance Training   Training Prescription Yes   Weight 4lbs   Reps 10-15   Time 10 Minutes     NuStep   Level 5   SPM 90   Minutes 10   METs 3     Arm Ergometer   Level 1   Minutes 10   METs 2.2     Track   Laps 11   Minutes 10   METs 2.74     Home Exercise Plan   Plans to continue exercise at Home (comment)   Frequency Add 2 additional days to program exercise sessions.   Initial Home Exercises Provided 08/25/16      Nutrition:  Target Goals: Understanding of nutrition guidelines, daily intake of sodium <1577m, cholesterol <2023m calories 30% from fat and 7% or less from saturated fats, daily to have 5 or more servings of fruits and vegetables.  Biometrics:     Pre Biometrics -  08/08/16 1226      Pre Biometrics   Waist Circumference 43 inches   Hip Circumference 51.25 inches   Waist to Hip Ratio 0.84 %   Triceps Skinfold 24 mm   % Body Fat 32 %   Grip Strength 35 kg   Flexibility 9.5 in   Single Leg Stand 2 seconds       Nutrition Therapy Plan and Nutrition Goals:     Nutrition Therapy & Goals - 08/21/16 0932      Nutrition Therapy   Diet Carb Modified, Therapuetic Lifestyle Changes     Personal Nutrition Goals   Nutrition Goal Wt loss of 1-2 lb/week to a wt loss goal of 6-24 lb at graduation from [place] Goal #2 Pt to identify and limit food sources of saturated fat, trans fat, and sodium     Intervention Plan   Intervention Prescribe, educate and counsel regarding  individualized specific dietary modifications aiming towards targeted core components such as weight, hypertension, lipid management, diabetes, heart failure and other comorbidities.;Nutrition handout(s) given to patient.  Consistent vitamin K intake   Expected Outcomes Short Term Goal: Understand basic principles of dietary content, such as calories, fat, sodium, cholesterol and nutrients.;Long Term Goal: Adherence to prescribed nutrition plan.      Nutrition Discharge: Nutrition Scores:     Nutrition Assessments - 08/21/16 0932      MEDFICTS Scores   Pre Score 89      Nutrition Goals Re-Evaluation:   Nutrition Goals Re-Evaluation:   Nutrition Goals Discharge (Final Nutrition Goals Re-Evaluation):   Psychosocial: Target Goals: Acknowledge presence or absence of significant depression and/or stress, maximize coping skills, provide positive support system. Participant is able to verbalize types and ability to use techniques and skills needed for reducing stress and depression.  Initial Review & Psychosocial Screening:     Initial Psych Review & Screening - 08/15/16 1651      Initial Review   Current issues with None Identified     Family Dynamics   Good Support System? Yes   Comments Upon brief assessment, no psychosocial needs identified, no intervention necessary.       Barriers   Psychosocial barriers to participate in program There are no identifiable barriers or psychosocial needs.     Screening Interventions   Interventions Yes   Expected Outcomes Long Term Goal: Stressors or current issues are controlled or eliminated.      Quality of Life Scores:     Quality of Life - 08/25/16 1214      Quality of Life Scores   Health/Function Pre 25.53 %   Socioeconomic Pre 26.2 %   Psych/Spiritual Pre 26.57 %   Family Pre 28.5 %   GLOBAL Pre 26.33 %  QOL scores reviewed with pt. pt demonstrates positive hopeful outlook with good coping skills. pt verbalizes this as  accurate.  pt offered emotional support and reassurance.        PHQ-9: Recent Review Flowsheet Data    Depression screen [facility] 2/9 09/07/2016 08/14/2016 02/21/2016 09/14/2015 09/08/2015   Decreased Interest 0 0 0 0 0   Down, Depressed, Hopeless 0 0 0 0 0   PHQ - 2 Score 0 0 0 0 0     Interpretation of Total Score  Total Score Depression Severity:  1-4 = Minimal depression, 5-9 = Mild depression, 10-14 = Moderate depression, 15-19 = Moderately severe depression, 20-27 = Severe depression   Psychosocial Evaluation and Intervention:  Psychosocial Evaluation - 08/15/16 1652      Psychosocial Evaluation & Interventions   Interventions Encouraged to exercise with the program and follow exercise prescription   Expected Outcomes pt will demonstrate positive outlook with good coping skills.    Continue Psychosocial Services  No Follow up required      Psychosocial Re-Evaluation:     Psychosocial Re-Evaluation    [place] Name 09/12/16 0724 10/11/16 1646           Psychosocial Re-Evaluation   Current issues with None Identified None Identified      Comments no psychosocial concerns identified, no interventions necessary.   no psychosocial concerns identified, no interventions necessary.        Expected Outcomes pt will demonstrate good coping skills wtih positive hopeful outlook.  pt will demonstrate good coping skills wtih positive hopeful outlook.       Interventions Encouraged to attend Cardiac Rehabilitation for the exercise Encouraged to attend Cardiac Rehabilitation for the exercise      Continue Psychosocial Services  No Follow up required No Follow up required         Psychosocial Discharge (Final Psychosocial Re-Evaluation):     Psychosocial Re-Evaluation - 10/11/16 1646      Psychosocial Re-Evaluation   Current issues with None Identified   Comments no psychosocial concerns identified, no interventions necessary.     Expected Outcomes pt will demonstrate good coping  skills wtih positive hopeful outlook.    Interventions Encouraged to attend Cardiac Rehabilitation for the exercise   Continue Psychosocial Services  No Follow up required      Vocational Rehabilitation: Provide vocational rehab assistance to qualifying candidates.   Vocational Rehab Evaluation & Intervention:     Vocational Rehab - 08/08/16 1619      Initial Vocational Rehab Evaluation & Intervention   Assessment shows need for Vocational Rehabilitation No  retired      Education: Education Goals: Education classes will be provided on a weekly basis, covering required topics. Participant will state understanding/return demonstration of topics presented.  Learning Barriers/Preferences:     Learning Barriers/Preferences - 08/08/16 3007      Learning Barriers/Preferences   Learning Barriers Sight   Learning Preferences Skilled Demonstration      Education Topics: Count Your Pulse:  -Group instruction provided by verbal instruction, demonstration, patient participation and written materials to support subject.  Instructors address importance of being able to find your pulse and how to count your pulse when at home without a heart monitor.  Patients get hands on experience counting their pulse with staff help and individually.   CARDIAC REHAB PHASE II EXERCISE from 10/06/2016 in [place]  Date  09/22/16  Instruction Review Code  2- meets goals/outcomes      Heart Attack, Angina, and Risk Factor Modification:  -Group instruction provided by verbal instruction, video, and written materials to support subject.  Instructors address signs and symptoms of angina and heart attacks.    Also discuss risk factors for heart disease and how to make changes to improve heart health risk factors.   Functional Fitness:  -Group instruction provided by verbal instruction, demonstration, patient participation, and written materials to support subject.   Instructors address safety measures for doing things around the house.  Discuss how to get up and down off the floor, how to pick things up properly, how to safely get out of a chair without assistance, and balance training.   CARDIAC REHAB PHASE II EXERCISE  from 10/06/2016 in [place]  Date  10/06/16  Instruction Review Code  2- meets goals/outcomes      Meditation and Mindfulness:  -Group instruction provided by verbal instruction, patient participation, and written materials to support subject.  Instructor addresses importance of mindfulness and meditation practice to help reduce stress and improve awareness.  Instructor also leads participants through a meditation exercise.    CARDIAC REHAB PHASE II EXERCISE from 10/06/2016 in [place]  Date  09/06/16  Instruction Review Code  2- meets goals/outcomes      Stretching for Flexibility and Mobility:  -Group instruction provided by verbal instruction, patient participation, and written materials to support subject.  Instructors lead participants through series of stretches that are designed to increase flexibility thus improving mobility.  These stretches are additional exercise for major muscle groups that are typically performed during regular warm up and cool down.   CARDIAC REHAB PHASE II EXERCISE from 10/06/2016 in [place]  Date  09/15/16  Instruction Review Code  2- meets goals/outcomes      Hands Only CPR:  -Group verbal, video, and participation provides a basic overview of AHA guidelines for community CPR. Role-play of emergencies allow participants the opportunity to practice calling for help and chest compression technique with discussion of AED use.   Hypertension: -Group verbal and written instruction that provides a basic overview of hypertension including the most recent diagnostic guidelines, risk factor reduction with  self-care instructions and medication management.    Nutrition I class: Heart Healthy Eating:  -Group instruction provided by PowerPoint slides, verbal discussion, and written materials to support subject matter. The instructor gives an explanation and review of the Therapeutic Lifestyle Changes diet recommendations, which includes a discussion on lipid goals, dietary fat, sodium, fiber, plant stanol/sterol esters, sugar, and the components of a well-balanced, healthy diet.   Nutrition II class: Lifestyle Skills:  -Group instruction provided by PowerPoint slides, verbal discussion, and written materials to support subject matter. The instructor gives an explanation and review of label reading, grocery shopping for heart health, heart healthy recipe modifications, and ways to make healthier choices when eating out.   Diabetes Question & Answer:  -Group instruction provided by PowerPoint slides, verbal discussion, and written materials to support subject matter. The instructor gives an explanation and review of diabetes co-morbidities, pre- and post-prandial blood glucose goals, pre-exercise blood glucose goals, signs, symptoms, and treatment of hypoglycemia and hyperglycemia, and foot care basics.   CARDIAC REHAB PHASE II EXERCISE from 10/06/2016 in [place]  Date  09/29/16  Educator  RD  Instruction Review Code  2- meets goals/outcomes      Diabetes Blitz:  -Group instruction provided by PowerPoint slides, verbal discussion, and written materials to support subject matter. The instructor gives an explanation and review of the physiology behind type 1 and type 2 diabetes, diabetes medications and rational behind using different medications, pre- and post-prandial blood glucose recommendations and Hemoglobin A1c goals, diabetes diet, and exercise including blood glucose guidelines for exercising safely.    Portion Distortion:  -Group instruction provided by  PowerPoint slides, verbal discussion, written materials, and food models to support subject matter. The instructor gives an explanation of serving size versus portion size, changes in portions sizes over the last 20 years, and what consists of a serving from each food group.   CARDIAC REHAB PHASE II EXERCISE from 10/06/2016 in [place]  [place]  Date  09/14/16  Educator  RD  Instruction Review Code  2- meets goals/outcomes      Stress Management:  -Group instruction provided by verbal instruction, video, and written materials to support subject matter.  Instructors review role of stress in heart disease and how to cope with stress positively.     CARDIAC REHAB PHASE II EXERCISE from 10/06/2016 in [place]  Date  09/20/16  Instruction Review Code  2- meets goals/outcomes      Exercising on Your Own:  -Group instruction provided by verbal instruction, power point, and written materials to support subject.  Instructors discuss benefits of exercise, components of exercise, frequency and intensity of exercise, and end points for exercise.  Also discuss use of nitroglycerin and activating EMS.  Review options of places to exercise outside of rehab.  Review guidelines for sex with heart disease.   Cardiac Drugs I:  -Group instruction provided by verbal instruction and written materials to support subject.  Instructor reviews cardiac drug classes: antiplatelets, anticoagulants, beta blockers, and statins.  Instructor discusses reasons, side effects, and lifestyle considerations for each drug class.   CARDIAC REHAB PHASE II EXERCISE from 10/06/2016 in [place]  Date  08/30/16  Educator  Kennyth Lose  Instruction Review Code  2- meets goals/outcomes      Cardiac Drugs II:  -Group instruction provided by verbal instruction and written materials to support subject.  Instructor reviews cardiac drug classes:  angiotensin converting enzyme inhibitors (ACE-I), angiotensin II receptor blockers (ARBs), nitrates, and calcium channel blockers.  Instructor discusses reasons, side effects, and lifestyle considerations for each drug class.   CARDIAC REHAB PHASE II EXERCISE from 10/06/2016 in [place]  Date  09/27/16  Educator  Kennyth Lose  Instruction Review Code  2- meets goals/outcomes      Anatomy and Physiology of the Circulatory System:  Group verbal and written instruction and models provide basic cardiac anatomy and physiology, with the coronary electrical and arterial systems. Review of: AMI, Angina, Valve disease, Heart Failure, Peripheral Artery Disease, Cardiac Arrhythmia, Pacemakers, and the ICD.   Other Education:  -Group or individual verbal, written, or video instructions that support the educational goals of the cardiac rehab program.   Knowledge Questionnaire Score:     Knowledge Questionnaire Score - 08/08/16 1234      Knowledge Questionnaire Score   Pre Score 21/24      Core Components/Risk Factors/Patient Goals at Admission:     Personal Goals and Risk Factors at Admission - 08/08/16 0924      Core Components/Risk Factors/Patient Goals on Admission    Weight Management Yes;Weight Loss;Obesity   Intervention Weight Management: Develop a combined nutrition and exercise program designed to reach desired caloric intake, while maintaining appropriate intake of nutrient and fiber, sodium and fats, and appropriate energy expenditure required for the weight goal.;Weight Management: Provide education and appropriate resources to help participant work on and attain dietary goals.;Weight Management/Obesity: Establish reasonable short term and long term weight goals.;Obesity: Provide education and appropriate resources to help participant work on and attain dietary goals.   Expected Outcomes Short Term: Continue to assess and modify interventions until short  term weight is achieved;Weight Maintenance: Understanding of the daily nutrition guidelines, which includes 25-35% calories from fat, 7% or less cal from saturated fats, less than 273m cholesterol, less than 1.5gm of sodium, & 5 or more servings of fruits and vegetables daily;Weight Loss:  Understanding of general recommendations for a balanced deficit meal plan, which promotes 1-2 lb weight loss per week and includes a negative energy balance of [phone] kcal/d;Understanding recommendations for meals to include 15-35% energy as protein, 25-35% energy from fat, 35-60% energy from carbohydrates, less than 267m of dietary cholesterol, 20-35 gm of total fiber daily;Understanding of distribution of calorie intake throughout the day with the consumption of 4-5 meals/snacks   Diabetes Yes   Intervention Provide education about signs/symptoms and action to take for hypo/hyperglycemia.;Provide education about proper nutrition, including hydration, and aerobic/resistive exercise prescription along with prescribed medications to achieve blood glucose in normal ranges: Fasting glucose 65-99 mg/dL   Expected Outcomes Short Term: Participant verbalizes understanding of the signs/symptoms and immediate care of hyper/hypoglycemia, proper foot care and importance of medication, aerobic/resistive exercise and nutrition plan for blood glucose control.;Long Term: Attainment of HbA1C < 7%.   Hypertension Yes   Intervention Provide education on lifestyle modifcations including regular physical activity/exercise, weight management, moderate sodium restriction and increased consumption of fresh fruit, vegetables, and low fat dairy, alcohol moderation, and smoking cessation.;Monitor prescription use compliance.   Expected Outcomes Short Term: Continued assessment and intervention until BP is < 140/955mHG in hypertensive participants. < 130/8025mG in hypertensive participants with diabetes, heart failure or chronic kidney  disease.;Long Term: Maintenance of blood pressure at goal levels.   Personal Goal Other Yes   Personal Goal goal weight at 210, and to drop one pants size. Currently at 48 64d desired pants size 46   Intervention Provide exercise programming and nutrition couseling to assist with weightloss goals    Expected Outcomes Pt will lose wt and get to desired pants size of 46.      Core Components/Risk Factors/Patient Goals Review:      Goals and Risk Factor Review    Row Name 09/12/16 [id number]/18 1645           Core Components/Risk Factors/Patient Goals Review   Personal Goals Review Weight Management/Obesity;Hypertension;Diabetes Weight Management/Obesity;Hypertension;Diabetes      Review pt c/o persistent dyspnea on exertion.  pt has been instructed in Pursed lip breathing techniques.  pt is continuing weight loss efforts.   pt c/o persistant dyspnea on exertion and fatigue, which he notes are improving. pt is continuing weight loss efforts.       Expected Outcomes pt will participate in CR activities of exercise, nutrition and life style modification education to decrease overall CAD risk factors.   pt will participate in CR activities of exercise, nutrition and life style modification education to decrease overall CAD risk factors.           Core Components/Risk Factors/Patient Goals at Discharge (Final Review):      Goals and Risk Factor Review - 10/11/16 1645      Core Components/Risk Factors/Patient Goals Review   Personal Goals Review Weight Management/Obesity;Hypertension;Diabetes   Review pt c/o persistant dyspnea on exertion and fatigue, which he notes are improving. pt is continuing weight loss efforts.    Expected Outcomes pt will participate in CR activities of exercise, nutrition and life style modification education to decrease overall CAD risk factors.        ITP Comments:     ITP Comments    Row Name 08/08/16 0920 09/01/16 0905         ITP Comments Dr. TraFransico Himedical Director 09/01/16, HTN, meets goals/outcomes         Comments: Pt is making expected progress toward personal  goals after completing 25 sessions. Recommend continued exercise and life style modification education including  stress management and relaxation techniques to decrease cardiac risk profile.

## 2016-10-13 ENCOUNTER — Ambulatory Visit (INDEPENDENT_AMBULATORY_CARE_PROVIDER_SITE_OTHER): Payer: PPO

## 2016-10-13 ENCOUNTER — Encounter (HOSPITAL_COMMUNITY)
Admission: RE | Admit: 2016-10-13 | Discharge: 2016-10-13 | Disposition: A | Discharge status: Home / Self Care | Payer: PPO | Source: Ambulatory Visit | Attending: Internal Medicine | Admitting: Internal Medicine

## 2016-10-13 DIAGNOSIS — Z5181 Encounter for therapeutic drug level monitoring: Secondary | ICD-10-CM

## 2016-10-13 DIAGNOSIS — Z953 Presence of xenogenic heart valve: Secondary | ICD-10-CM | POA: Diagnosis not present

## 2016-10-13 DIAGNOSIS — Z952 Presence of prosthetic heart valve: Secondary | ICD-10-CM

## 2016-10-13 LAB — POCT INR: INR: 1.7

## 2016-10-13 NOTE — Patient Instructions (Signed)
Pre visit review using our clinic review tool, if applicable. No additional management support is needed unless otherwise documented below in the visit note.  INR 1.7  Reviewed patient's medication, health and diet history.  He has cut out all V8 juice and has noted an overall drop in blood pressures and blood sugars to consistent normal ranges as a result of ongoing cardiac rehab.  Otherwise, there are no other changes in his general condition.    Patient is to take 1.5 pills (3.75mg ) today (5/25) and then increase weekly dose to taking 1 whole pill (2.5mg ) daily EXCEPT for 1/2 pills (1.25mg ) on Sundays.  Recheck in 3 weeks.  Patient verbalizes understanding of all instructions given today including risks associated with a subtherapeutic level and will go to ER if any concerns develop.

## 2016-10-16 ENCOUNTER — Encounter (HOSPITAL_COMMUNITY): Payer: PPO

## 2016-10-17 ENCOUNTER — Other Ambulatory Visit: Payer: PPO

## 2016-10-18 ENCOUNTER — Encounter (HOSPITAL_COMMUNITY)
Admission: RE | Admit: 2016-10-18 | Discharge: 2016-10-18 | Disposition: A | Discharge status: Home / Self Care | Payer: PPO | Source: Ambulatory Visit | Attending: Internal Medicine | Admitting: Internal Medicine

## 2016-10-18 DIAGNOSIS — Z952 Presence of prosthetic heart valve: Secondary | ICD-10-CM

## 2016-10-18 DIAGNOSIS — Z953 Presence of xenogenic heart valve: Secondary | ICD-10-CM | POA: Diagnosis not present

## 2016-10-20 ENCOUNTER — Encounter (HOSPITAL_COMMUNITY)
Admission: RE | Admit: 2016-10-20 | Discharge: 2016-10-20 | Disposition: A | Discharge status: Home / Self Care | Payer: PPO | Source: Ambulatory Visit | Attending: Internal Medicine | Admitting: Internal Medicine

## 2016-10-20 DIAGNOSIS — Z9889 Other specified postprocedural states: Secondary | ICD-10-CM | POA: Insufficient documentation

## 2016-10-20 DIAGNOSIS — Z953 Presence of xenogenic heart valve: Secondary | ICD-10-CM | POA: Insufficient documentation

## 2016-10-20 DIAGNOSIS — Z8679 Personal history of other diseases of the circulatory system: Secondary | ICD-10-CM | POA: Insufficient documentation

## 2016-10-20 DIAGNOSIS — Z952 Presence of prosthetic heart valve: Secondary | ICD-10-CM

## 2016-10-23 ENCOUNTER — Encounter (HOSPITAL_COMMUNITY)
Admission: RE | Admit: 2016-10-23 | Discharge: 2016-10-23 | Disposition: A | Discharge status: Home / Self Care | Payer: PPO | Source: Ambulatory Visit | Attending: Internal Medicine | Admitting: Internal Medicine

## 2016-10-23 DIAGNOSIS — Z953 Presence of xenogenic heart valve: Secondary | ICD-10-CM | POA: Diagnosis not present

## 2016-10-23 DIAGNOSIS — Z952 Presence of prosthetic heart valve: Secondary | ICD-10-CM

## 2016-10-25 ENCOUNTER — Encounter (HOSPITAL_COMMUNITY): Payer: PPO

## 2016-10-27 ENCOUNTER — Encounter (HOSPITAL_COMMUNITY): Payer: PPO

## 2016-10-30 ENCOUNTER — Encounter (HOSPITAL_COMMUNITY)
Admission: RE | Admit: 2016-10-30 | Discharge: 2016-10-30 | Disposition: A | Discharge status: Home / Self Care | Payer: PPO | Source: Ambulatory Visit | Attending: Internal Medicine | Admitting: Internal Medicine

## 2016-10-30 DIAGNOSIS — Z952 Presence of prosthetic heart valve: Secondary | ICD-10-CM

## 2016-10-30 DIAGNOSIS — Z953 Presence of xenogenic heart valve: Secondary | ICD-10-CM | POA: Diagnosis not present

## 2016-11-01 ENCOUNTER — Other Ambulatory Visit: Payer: Self-pay | Admitting: Family Medicine

## 2016-11-01 ENCOUNTER — Encounter (HOSPITAL_COMMUNITY)
Admission: RE | Admit: 2016-11-01 | Discharge: 2016-11-01 | Disposition: A | Discharge status: Home / Self Care | Payer: PPO | Source: Ambulatory Visit | Attending: Internal Medicine | Admitting: Internal Medicine

## 2016-11-01 DIAGNOSIS — Z953 Presence of xenogenic heart valve: Secondary | ICD-10-CM | POA: Diagnosis not present

## 2016-11-01 DIAGNOSIS — Z952 Presence of prosthetic heart valve: Secondary | ICD-10-CM

## 2016-11-03 ENCOUNTER — Ambulatory Visit (INDEPENDENT_AMBULATORY_CARE_PROVIDER_SITE_OTHER): Payer: PPO

## 2016-11-03 ENCOUNTER — Encounter (HOSPITAL_COMMUNITY)
Admission: RE | Admit: 2016-11-03 | Discharge: 2016-11-03 | Disposition: A | Discharge status: Home / Self Care | Payer: PPO | Source: Ambulatory Visit | Attending: Internal Medicine | Admitting: Internal Medicine

## 2016-11-03 DIAGNOSIS — Z952 Presence of prosthetic heart valve: Secondary | ICD-10-CM

## 2016-11-03 DIAGNOSIS — Z953 Presence of xenogenic heart valve: Secondary | ICD-10-CM | POA: Diagnosis not present

## 2016-11-03 DIAGNOSIS — Z5181 Encounter for therapeutic drug level monitoring: Secondary | ICD-10-CM | POA: Diagnosis not present

## 2016-11-03 LAB — POCT INR: INR: 2.6

## 2016-11-03 NOTE — Patient Instructions (Signed)
Pre visit review using our clinic review tool, if applicable. No additional management support is needed unless otherwise documented below in the visit note. 

## 2016-11-06 ENCOUNTER — Encounter (HOSPITAL_COMMUNITY)
Admission: RE | Admit: 2016-11-06 | Discharge: 2016-11-06 | Disposition: A | Discharge status: Home / Self Care | Payer: PPO | Source: Ambulatory Visit | Attending: Internal Medicine | Admitting: Internal Medicine

## 2016-11-06 VITALS — Ht 73.0 in | Wt 232.6 lb

## 2016-11-06 DIAGNOSIS — Z952 Presence of prosthetic heart valve: Secondary | ICD-10-CM

## 2016-11-06 DIAGNOSIS — Z953 Presence of xenogenic heart valve: Secondary | ICD-10-CM | POA: Diagnosis not present

## 2016-11-08 ENCOUNTER — Encounter (HOSPITAL_COMMUNITY)
Admission: RE | Admit: 2016-11-08 | Discharge: 2016-11-08 | Disposition: A | Discharge status: Home / Self Care | Payer: PPO | Source: Ambulatory Visit | Attending: Internal Medicine | Admitting: Internal Medicine

## 2016-11-08 DIAGNOSIS — Z953 Presence of xenogenic heart valve: Secondary | ICD-10-CM | POA: Diagnosis not present

## 2016-11-08 DIAGNOSIS — Z952 Presence of prosthetic heart valve: Secondary | ICD-10-CM

## 2016-11-08 NOTE — Progress Notes (Signed)
Cardiac Individual Treatment Plan  Patient Details  Name: Bill [name] MRN: [id number] Date of Birth: July 12, 1940 Referring Provider:     CARDIAC REHAB PHASE II ORIENTATION from 08/08/2016 in [place]  Referring Provider  Mikle Bosworth      Initial Encounter Date:    CARDIAC REHAB PHASE II ORIENTATION from 08/08/2016 in [place]  Date  08/08/16  Referring Provider  Mikle Bosworth      Visit Diagnosis: S/P AVR  Patient's Home Medications on Admission:  Current Outpatient Prescriptions:  .  acetaminophen (TYLENOL) 325 MG tablet, Take 650 mg by mouth every 4 (four) hours as needed for mild pain or fever., Disp: , Rfl:  .  cholecalciferol (VITAMIN D) 1000 UNITS tablet, Take 1,000 Units by mouth as directed. , Disp: , Rfl:  .  glipiZIDE (GLUCOTROL) 5 MG tablet, Take 1 tablet (5 mg total) by mouth daily before breakfast., Disp: 90 tablet, Rfl: 3 .  glucose blood (ONE TOUCH ULTRA TEST) test strip, Use to check sugar once daily and as needed. Dx:E11.319 **One Touch Ultra**, Disp: 100 each, Rfl: 2 .  lisinopril (ZESTRIL) 2.5 MG tablet, Take 1 tablet (2.5 mg total) by mouth daily., Disp: 30 tablet, Rfl: 6 .  Magnesium Oxide 250 MG TABS, Take 1 tablet by mouth daily as needed (supplement). , Disp: , Rfl:  .  metFORMIN (GLUCOPHAGE) 500 MG tablet, TAKE 1 TABLET (500 MG TOTAL) BY MOUTH 2 (TWO) TIMES DAILY WITH A MEAL., Disp: 180 tablet, Rfl: 1 .  Multiple Vitamin (MULTIVITAMIN WITH MINERALS) TABS tablet, Take by mouth daily. 1 packet of 5 vitamins daily, Disp: , Rfl:  .  multivitamin-lutein (OCUVITE-LUTEIN) CAPS capsule, Take 3 capsules by mouth as directed. , Disp: , Rfl:  .  oxybutynin (DITROPAN) 5 MG tablet, Take 1 tablet (5 mg total) by mouth 3 (three) times daily., Disp: 270 tablet, Rfl: 3 .  pravastatin (PRAVACHOL) 20 MG tablet, Take 1 tablet (20 mg total) by mouth every evening., Disp: 90 tablet, Rfl: 3 .  vitamin B-12  (CYANOCOBALAMIN) 1000 MCG tablet, Take 1,000 mcg by mouth as directed. , Disp: , Rfl:  .  warfarin (COUMADIN) 2.5 MG tablet, Take 1 tablet (2.5 mg total) by mouth daily at 6 PM. Or as directed., Disp: 35 tablet, Rfl: 2 .  warfarin (COUMADIN) 2.5 MG tablet, TAKE 1 TABLET (2.5 MG TOTAL) BY MOUTH DAILY AT 6 PM. OR AS DIRECTED., Disp: 35 tablet, Rfl: 1  Past Medical History: Past Medical History:  Diagnosis Date  . Aortic stenosis 12/2010   s/p bioprosthetic AVR 9/17 // Echo 6/17: EF 55-60, mean AV 25 mmHg // TEE 6/17: severe AS, AVA 0.93, mean AV 43 mmHg // post op Echo 02/07/16: mod conc LVH, EF 65-70, vigorous LVF, AVR ok, mean 9 mmHg, MAC, trivial MR, mod LAE, trivial pericardial eff  . Chronic diastolic CHF (congestive heart failure) ([place])   . Coronary artery disease    LHC 8/17: pLAD 20, mLAD 30, dLAD 20, mRCA 30, dRCA 20, RPDA 20  . Exogenous obesity   . GERD (gastroesophageal reflux disease)    occasional  . History of chicken pox   . Hypertension   . Kidney stones remote  . Lyme disease 2012   ?titers negative  . Neuromuscular disorder (HCC)    tremor right arm  . Persistent atrial fibrillation ([place])    Tikosyn // s/p Maze procedure 9/17 // Anticoagulation undesirable 2/2 hx of  retinal hemorrhage (worked up for [employer] but referred for Maze at time of AVR 2/2 aortic stenosis)   . PFO (patent foramen ovale)    s/p PFO closure at time of AVR in 9/17  . S/P aortic valve replacement with bioprosthetic valve 01/27/2016   27 mm [facility] Ease bovine pericardial tissue valve  . S/P Maze operation for atrial fibrillation 01/27/2016   Complete bilateral atrial lesion set using bipolar radiofrequency and cryothermy ablation with clipping of LA appendage  . Type 2 diabetes, controlled, with retinopathy ([place]) 1980s  . Urge incontinence   . Vitamin B12 deficiency    IF normal (2014)    Tobacco Use: History  Smoking Status  . Never Smoker  Smokeless Tobacco  . Never Used     Labs: Recent Review Flowsheet Data    Labs for ITP Cardiac and Pulmonary Rehab Latest Ref Rng & Units 01/28/2016 01/28/2016 01/28/2016 03/31/2016 09/07/2016   Cholestrol 0 - 200 mg/dL - - - 141 131   LDLCALC 0 - 99 mg/dL - - - 74 58   LDLDIRECT mg/dL - - - - -   HDL >39.00 mg/dL - - - 49.50 59.90   Trlycerides 0.0 - 149.0 mg/dL - - - 87.0 65.0   Hemoglobin A1c 4.6 - 6.5 % - - - - 6.1   PHART 7.350 - 7.450 7.294(L) 7.302(L) - - -   PCO2ART 32.0 - 48.0 mmHg 40.1 39.9 - - -   HCO3 20.0 - 28.0 mmol/L 19.3(L) 19.5(L) - - -   TCO2 0 - 100 mmol/L _0 - -   ACIDBASEDEF 0.0 - 2.0 mmol/L 7.0(H) 6.0(H) - - -   O2SAT % 98.0 96.0 - - -      Capillary Blood Glucose: Lab Results  Component Value Date   GLUCAP 143 (H) 10/11/2016   GLUCAP 157 (H) 10/04/2016   GLUCAP 99 10/02/2016   GLUCAP 76 10/02/2016   GLUCAP 145 (H) 09/04/2016     Exercise Target Goals:    Exercise Program Goal: Individual exercise prescription set with THRR, safety & activity barriers. Participant demonstrates ability to understand and report RPE using BORG scale, to self-measure pulse accurately, and to acknowledge the importance of the exercise prescription.  Exercise Prescription Goal: Starting with aerobic activity 30 plus minutes a day, 3 days per week for initial exercise prescription. Provide home exercise prescription and guidelines that participant acknowledges understanding prior to discharge.  Activity Barriers & Risk Stratification:     Activity Barriers & Cardiac Risk Stratification - 08/08/16 0923      Activity Barriers & Cardiac Risk Stratification   Activity Barriers Back Problems;Deconditioning;Muscular Weakness   Cardiac Risk Stratification High      6 Minute Walk:     6 Minute Walk    Row Name 08/08/16 1225 11/06/16 1032 11/06/16 1048     6 Minute Walk   Phase Initial Discharge  -   Distance 1221 feet 1344 feet  -   Distance % Change  - 10.07 %  -   Walk Time 6 minutes 6 minutes  -    # of Rest Breaks 0 0  -   MPH 2.31 2.55  -   METS 2.32 2.51  -   RPE 12 10  -   Perceived Dyspnea   - 1  -   VO2 Peak 8.15 8.77  -   Symptoms No Yes (comment)  -   Comments  - SOB  -   Resting  HR 80 bpm 88 bpm  -   Resting BP 131/67 138/58  -   Max Ex. HR 103 bpm 101 bpm  -   Max Ex. BP 144/70 142/60  -   2 Minute Post BP 128/74  - 118/60      Oxygen Initial Assessment:   Oxygen Re-Evaluation:   Oxygen Discharge (Final Oxygen Re-Evaluation):   Initial Exercise Prescription:     Initial Exercise Prescription - 08/08/16 1200      Date of Initial Exercise RX and Referring Provider   Date 08/08/16   Referring Provider Mikle Bosworth     NuStep   Level 3   SPM 60   Minutes 10   METs 2     Arm Ergometer   Level 1   Minutes 10   METs 1     Track   Laps 8   Minutes 10   METs 2.39     Prescription Details   Frequency (times per week) 3   Duration Progress to 30 minutes of continuous aerobic without signs/symptoms of physical distress     Intensity   THRR 40-80% of Max Heartrate 57-114   Ratings of Perceived Exertion 11-13   Perceived Dyspnea 0-4     Progression   Progression Continue to progress workloads to maintain intensity without signs/symptoms of physical distress.     Resistance Training   Training Prescription Yes   Weight 2lbs   Reps 10-15      Perform Capillary Blood Glucose checks as needed.  Exercise Prescription Changes:     Exercise Prescription Changes    Row Name 08/14/16 1600 08/16/16 1000 08/29/16 1600 09/11/16 1038 09/11/16 1600     Response to Exercise   Blood Pressure (Admit) - 118/60 126/68 124/60 124/60   Blood Pressure (Exercise) - 146/70 140/74 148/68 148/68   Blood Pressure (Exit) - 118/62 110/60 134/70 134/70   Heart Rate (Admit) - 75 bpm 97 bpm 97 bpm 97 bpm   Heart Rate (Exercise) - 85 bpm 105 bpm 104 bpm 104 bpm   Heart Rate (Exit) - 96 bpm 83 bpm 86 bpm 86 bpm   Rating of Perceived Exertion (Exercise) - _0 -1   Symptoms - none none none none   Comments  - pt completed 2 exercise sessions and tolerating exercise fairly well pt completed 2 exercise sessions and tolerating exercise fairly well pt completed 2 exercise sessions and tolerated exercise fairly well pt completed 2 exercise sessions and tolerating exercise fairly well   Duration - Continue with 30 min of aerobic exercise without signs/symptoms of physical distress. Continue with 30 min of aerobic exercise without signs/symptoms of physical distress. Continue with 30 min of aerobic exercise without signs/symptoms of physical distress. Continue with 30 min of aerobic exercise without signs/symptoms of physical distress.   Intensity - THRR unchanged THRR unchanged THRR unchanged THRR unchanged     Progression   Average METs  - 2.1 2.2 2.4 2.4     Resistance Training   Training Prescription - Yes Yes Yes Yes   Weight - 2lbs 2lbs 4lbs 4lbs   Reps - 10-15 10-15 10-15 10-15   Time - 10 Minutes 10 Minutes 10 Minutes 10 Minutes     NuStep   Level - _1 SPM - 60 70 80 80   Minutes - _2 METs - 2.2 2.5 2.6 2.6     Arm Ergometer  Level - _0 Minutes - _1 METs - 2.2 2.2 2.2 2.2     Track   Laps  - _2 Minutes  - _3 METs  - 2.03 2.03 2.39 2.39     Home Exercise Plan   Plans to continue exercise at  -  - Home (comment) Home (comment) Home (comment)   Frequency  -  - Add 2 additional days to program exercise sessions. Add 2 additional days to program exercise sessions. Add 2 additional days to program exercise sessions.   Initial Home Exercises Provided  -  - 08/25/16 08/25/16 08/25/16   Row Name 09/25/16 1000 10/10/16 1600 10/24/16 1200         Response to Exercise   Blood Pressure (Admit) 142/78 108/60 132/70     Blood Pressure (Exercise) 156/78 142/60 126/60     Blood Pressure (Exit) 130/60 118/62 100/58     Heart Rate (Admit) 75 bpm 922 bpm 65 bpm     Heart Rate (Exercise)  81 bpm 93 bpm 86 bpm     Heart Rate (Exit) 75 bpm 70 bpm 63 bpm     Rating of Perceived Exertion (Exercise) _4 Symptoms none none none     Comments -  -  -     Duration Continue with 30 min of aerobic exercise without signs/symptoms of physical distress. Continue with 30 min of aerobic exercise without signs/symptoms of physical distress. Continue with 30 min of aerobic exercise without signs/symptoms of physical distress.     Intensity THRR unchanged THRR unchanged THRR unchanged       Progression   Progression  - Continue to progress workloads to maintain intensity without signs/symptoms of physical distress. Continue to progress workloads to maintain intensity without signs/symptoms of physical distress.     Average METs 2.1 2.7 2.8       Resistance Training   Training Prescription Yes Yes Yes     Weight 4lbs 4lbs 4lbs     Reps 10-15 10-15 10-15     Time 10 Minutes 10 Minutes 10 Minutes       NuStep   Level _5 SPM 80 90 90     Minutes _6 METs 2._7 Arm Ergometer   Level _8 Minutes _9 METs 1.43 2.2 2.23       Track   Laps _10 Minutes _11 METs 2.03 2.74 3.09       Home Exercise Plan   Plans to continue exercise at Home (comment) Home (comment) Home (comment)     Frequency Add 2 additional days to program exercise sessions. Add 2 additional days to program exercise sessions. Add 2 additional days to program exercise sessions.     Initial Home Exercises Provided 08/25/16 08/25/16 08/25/16        Exercise Comments:     Exercise Comments    Row Name 08/16/16 1058 08/25/16 0932 09/12/16 1359 10/04/16 1521 11/06/16 1035   Exercise Comments Pt completed 2 exercise sessions in cardiac rehab. Pt is tolerating exercise fairly well; will continue to monitor pt's progress Home exercise completed Reviewed METs and goals. Pt  is tolerating exercise fairly well; will continue to monitor exercise progression and  activity levels Reviewed METs and goals. Pt is tolerating exercise fairly well; will continue to monitor exercise progression and activity levels Reviewed METs and goals. Pt is tolerating exercise fairly well; will continue to monitor exercise progression and activity levels      Exercise Goals and Review:     Exercise Goals    Row Name 08/08/16 0923             Exercise Goals   Increase Physical Activity Yes       Intervention Provide advice, education, support and counseling about physical activity/exercise needs.;Develop an individualized exercise prescription for aerobic and resistive training based on initial evaluation findings, risk stratification, comorbidities and participant's personal goals.       Expected Outcomes Achievement of increased cardiorespiratory fitness and enhanced flexibility, muscular endurance and strength shown through measurements of functional capacity and personal statement of participant.       Increase Strength and Stamina Yes       Intervention Provide advice, education, support and counseling about physical activity/exercise needs.;Develop an individualized exercise prescription for aerobic and resistive training based on initial evaluation findings, risk stratification, comorbidities and participant's personal goals.       Expected Outcomes Achievement of increased cardiorespiratory fitness and enhanced flexibility, muscular endurance and strength shown through measurements of functional capacity and personal statement of participant.          Exercise Goals Re-Evaluation :     Exercise Goals Re-Evaluation    Row Name 09/12/16 1400 10/04/16 1519 11/06/16 1031         Exercise Goal Re-Evaluation   Exercise Goals Review Increase Physical Activity;Increase Strenth and Stamina Increase Physical Activity;Increase Strenth and Stamina Increase Physical Activity;Increase Strenth and Stamina     Comments Pt stated " balance/functional is improving, but  cannot feel a difference with strength/stamina" Pt is progressing and tolerating WL increases. Pt Nustep MET average and WL has increased since start of program. Pt is making small changes and progression.  Pt is progressing and tolerating WL increases. Pt Nustep MET average has increased, Pt is making small changes and improved balance progression.  Pt is progressing and tolerating WL increases. Pt is making small changes and has improved walking tolerance and flexibilty.     Expected Outcomes Pt will continue with 30 minutes of aerobic activity and improve in functional fitness Pt will continue with 30 minutes of aerobic activity and improve in functional mobility and balance Pt will continue with 30 minutes of aerobic activity and improve in functional mobility and balance         Discharge Exercise Prescription (Final Exercise Prescription Changes):     Exercise Prescription Changes - 10/24/16 1200      Response to Exercise   Blood Pressure (Admit) 132/70   Blood Pressure (Exercise) 126/60   Blood Pressure (Exit) 100/58   Heart Rate (Admit) 65 bpm   Heart Rate (Exercise) 86 bpm   Heart Rate (Exit) 63 bpm   Rating of Perceived Exertion (Exercise) 11   Symptoms none   Duration Continue with 30 min of aerobic exercise without signs/symptoms of physical distress.   Intensity THRR unchanged     Progression   Progression Continue to progress workloads to maintain intensity without signs/symptoms of physical distress.   Average METs 2.8     Resistance Training   Training Prescription Yes   Weight 4lbs   Reps 10-15   Time  10 Minutes     NuStep   Level 5   SPM 90   Minutes 10   METs 3     Arm Ergometer   Level 1   Minutes 10   METs 2.23     Track   Laps 12   Minutes 10   METs 3.09     Home Exercise Plan   Plans to continue exercise at Home (comment)   Frequency Add 2 additional days to program exercise sessions.   Initial Home Exercises Provided 08/25/16       Nutrition:  Target Goals: Understanding of nutrition guidelines, daily intake of sodium <1554m, cholesterol <2068m calories 30% from fat and 7% or less from saturated fats, daily to have 5 or more servings of fruits and vegetables.  Biometrics:     Pre Biometrics - 08/08/16 1226      Pre Biometrics   Waist Circumference 43 inches   Hip Circumference 51.25 inches   Waist to Hip Ratio 0.84 %   Triceps Skinfold 24 mm   % Body Fat 32 %   Grip Strength 35 kg   Flexibility 9.5 in   Single Leg Stand 2 seconds         Post Biometrics - 11/06/16 1035       Post  Biometrics   Height _0  (1.854 m)   Weight 232 lb 9.4 oz (105.5 kg)   Waist Circumference 41 inches   Hip Circumference 48 inches   Waist to Hip Ratio 0.85 %   BMI (Calculated) 30.8   Triceps Skinfold 24 mm   % Body Fat 30.8 %   Grip Strength 36 kg   Flexibility 12 in   Single Leg Stand 2 seconds      Nutrition Therapy Plan and Nutrition Goals:     Nutrition Therapy & Goals - 08/21/16 0932      Nutrition Therapy   Diet Carb Modified, Therapuetic Lifestyle Changes     Personal Nutrition Goals   Nutrition Goal Wt loss of 1-2 lb/week to a wt loss goal of 6-24 lb at graduation from Cardiac Rehab   Personal Goal #2 Pt to identify and limit food sources of saturated fat, trans fat, and sodium     Intervention Plan   Intervention Prescribe, educate and counsel regarding individualized specific dietary modifications aiming towards targeted core components such as weight, hypertension, lipid management, diabetes, heart failure and other comorbidities.;Nutrition handout(s) given to patient.  Consistent vitamin K intake   Expected Outcomes Short Term Goal: Understand basic principles of dietary content, such as calories, fat, sodium, cholesterol and nutrients.;Long Term Goal: Adherence to prescribed nutrition plan.      Nutrition Discharge: Nutrition Scores:     Nutrition Assessments - 08/21/16 0932       MEDFICTS Scores   Pre Score 89      Nutrition Goals Re-Evaluation:   Nutrition Goals Re-Evaluation:   Nutrition Goals Discharge (Final Nutrition Goals Re-Evaluation):   Psychosocial: Target Goals: Acknowledge presence or absence of significant depression and/or stress, maximize coping skills, provide positive support system. Participant is able to verbalize types and ability to use techniques and skills needed for reducing stress and depression.  Initial Review & Psychosocial Screening:     Initial Psych Review & Screening - 08/15/16 1651      Initial Review   Current issues with None Identified     Family Dynamics   Good Support System? Yes   Comments Upon brief assessment, no psychosocial  needs identified, no intervention necessary.       Barriers   Psychosocial barriers to participate in program There are no identifiable barriers or psychosocial needs.     Screening Interventions   Interventions Yes   Expected Outcomes Long Term Goal: Stressors or current issues are controlled or eliminated.      Quality of Life Scores:     Quality of Life - 08/25/16 1214      Quality of Life Scores   Health/Function Pre 25.53 %   Socioeconomic Pre 26.2 %   Psych/Spiritual Pre 26.57 %   Family Pre 28.5 %   GLOBAL Pre 26.33 %  QOL scores reviewed with pt. pt demonstrates positive hopeful outlook with good coping skills. pt verbalizes this as accurate.  pt offered emotional support and reassurance.        PHQ-9: Recent Review Flowsheet Data    Depression screen [facility] 2/9 09/07/2016 08/14/2016 02/21/2016 09/14/2015 09/08/2015   Decreased Interest 0 0 0 0 0   Down, Depressed, Hopeless 0 0 0 0 0   PHQ - 2 Score 0 0 0 0 0     Interpretation of Total Score  Total Score Depression Severity:  1-4 = Minimal depression, 5-9 = Mild depression, 10-14 = Moderate depression, 15-19 = Moderately severe depression, 20-27 = Severe depression   Psychosocial Evaluation and Intervention:      Psychosocial Evaluation - 08/15/16 1652      Psychosocial Evaluation & Interventions   Interventions Encouraged to exercise with the program and follow exercise prescription   Expected Outcomes pt will demonstrate positive outlook with good coping skills.    Continue Psychosocial Services  No Follow up required      Psychosocial Re-Evaluation:     Psychosocial Re-Evaluation    [place] Name 09/12/16 0724 10/11/16 1646 11/08/16 1107         Psychosocial Re-Evaluation   Current issues with None Identified None Identified None Identified     Comments no psychosocial concerns identified, no interventions necessary.   no psychosocial concerns identified, no interventions necessary.   no psychosocial concerns identified, no interventions necessary.       Expected Outcomes pt will demonstrate good coping skills wtih positive hopeful outlook.  pt will demonstrate good coping skills wtih positive hopeful outlook.  pt will demonstrate good coping skills wtih positive hopeful outlook.      Interventions Encouraged to attend Cardiac Rehabilitation for the exercise Encouraged to attend Cardiac Rehabilitation for the exercise Encouraged to attend Cardiac Rehabilitation for the exercise     Continue Psychosocial Services  No Follow up required No Follow up required No Follow up required        Psychosocial Discharge (Final Psychosocial Re-Evaluation):     Psychosocial Re-Evaluation - 11/08/16 1107      Psychosocial Re-Evaluation   Current issues with None Identified   Comments no psychosocial concerns identified, no interventions necessary.     Expected Outcomes pt will demonstrate good coping skills wtih positive hopeful outlook.    Interventions Encouraged to attend Cardiac Rehabilitation for the exercise   Continue Psychosocial Services  No Follow up required      Vocational Rehabilitation: Provide vocational rehab assistance to qualifying candidates.   Vocational Rehab Evaluation &  Intervention:     Vocational Rehab - 08/08/16 1619      Initial Vocational Rehab Evaluation & Intervention   Assessment shows need for Vocational Rehabilitation No  retired      Education: Education Goals: Education classes  will be provided on a weekly basis, covering required topics. Participant will state understanding/return demonstration of topics presented.  Learning Barriers/Preferences:     Learning Barriers/Preferences - 08/08/16 6720      Learning Barriers/Preferences   Learning Barriers Sight   Learning Preferences Skilled Demonstration      Education Topics: Count Your Pulse:  -Group instruction provided by verbal instruction, demonstration, patient participation and written materials to support subject.  Instructors address importance of being able to find your pulse and how to count your pulse when at home without a heart monitor.  Patients get hands on experience counting their pulse with staff help and individually.   CARDIAC REHAB PHASE II EXERCISE from 11/08/2016 in [place]  Date  09/22/16  Instruction Review Code  2- meets goals/outcomes      Heart Attack, Angina, and Risk Factor Modification:  -Group instruction provided by verbal instruction, video, and written materials to support subject.  Instructors address signs and symptoms of angina and heart attacks.    Also discuss risk factors for heart disease and how to make changes to improve heart health risk factors.   CARDIAC REHAB PHASE II EXERCISE from 11/08/2016 in [place]  Date  10/11/16  Instruction Review Code  2- meets goals/outcomes      Functional Fitness:  -Group instruction provided by verbal instruction, demonstration, patient participation, and written materials to support subject.  Instructors address safety measures for doing things around the house.  Discuss how to get up and down off the floor, how to pick things up  properly, how to safely get out of a chair without assistance, and balance training.   CARDIAC REHAB PHASE II EXERCISE from 11/08/2016 in [place]  Date  11/03/16  Instruction Review Code  2- meets goals/outcomes      Meditation and Mindfulness:  -Group instruction provided by verbal instruction, patient participation, and written materials to support subject.  Instructor addresses importance of mindfulness and meditation practice to help reduce stress and improve awareness.  Instructor also leads participants through a meditation exercise.    CARDIAC REHAB PHASE II EXERCISE from 11/08/2016 in [place]  Date  09/06/16  Instruction Review Code  2- meets goals/outcomes      Stretching for Flexibility and Mobility:  -Group instruction provided by verbal instruction, patient participation, and written materials to support subject.  Instructors lead participants through series of stretches that are designed to increase flexibility thus improving mobility.  These stretches are additional exercise for major muscle groups that are typically performed during regular warm up and cool down.   CARDIAC REHAB PHASE II EXERCISE from 11/08/2016 in [place]  Date  10/13/16  Instruction Review Code  2- meets goals/outcomes      Hands Only CPR:  -Group verbal, video, and participation provides a basic overview of AHA guidelines for community CPR. Role-play of emergencies allow participants the opportunity to practice calling for help and chest compression technique with discussion of AED use.   Hypertension: -Group verbal and written instruction that provides a basic overview of hypertension including the most recent diagnostic guidelines, risk factor reduction with self-care instructions and medication management.    Nutrition I class: Heart Healthy Eating:  -Group instruction provided by PowerPoint  slides, verbal discussion, and written materials to support subject matter. The instructor gives an explanation and review of the Therapeutic Lifestyle Changes  diet recommendations, which includes a discussion on lipid goals, dietary fat, sodium, fiber, plant stanol/sterol esters, sugar, and the components of a well-balanced, healthy diet.   Nutrition II class: Lifestyle Skills:  -Group instruction provided by PowerPoint slides, verbal discussion, and written materials to support subject matter. The instructor gives an explanation and review of label reading, grocery shopping for heart health, heart healthy recipe modifications, and ways to make healthier choices when eating out.   Diabetes Question & Answer:  -Group instruction provided by PowerPoint slides, verbal discussion, and written materials to support subject matter. The instructor gives an explanation and review of diabetes co-morbidities, pre- and post-prandial blood glucose goals, pre-exercise blood glucose goals, signs, symptoms, and treatment of hypoglycemia and hyperglycemia, and foot care basics.   CARDIAC REHAB PHASE II EXERCISE from 11/08/2016 in [place]  Date  09/29/16  Educator  RD  Instruction Review Code  2- meets goals/outcomes      Diabetes Blitz:  -Group instruction provided by PowerPoint slides, verbal discussion, and written materials to support subject matter. The instructor gives an explanation and review of the physiology behind type 1 and type 2 diabetes, diabetes medications and rational behind using different medications, pre- and post-prandial blood glucose recommendations and Hemoglobin A1c goals, diabetes diet, and exercise including blood glucose guidelines for exercising safely.    Portion Distortion:  -Group instruction provided by PowerPoint slides, verbal discussion, written materials, and food models to support subject matter. The instructor gives an explanation of  serving size versus portion size, changes in portions sizes over the last 20 years, and what consists of a serving from each food group.   CARDIAC REHAB PHASE II EXERCISE from 11/08/2016 in [employer]  Date  11/08/16  Educator  RD  Instruction Review Code  2- meets goals/outcomes      Stress Management:  -Group instruction provided by verbal instruction, video, and written materials to support subject matter.  Instructors review role of stress in heart disease and how to cope with stress positively.     CARDIAC REHAB PHASE II EXERCISE from 11/08/2016 in [place]  Date  09/20/16  Instruction Review Code  2- meets goals/outcomes      Exercising on Your Own:  -Group instruction provided by verbal instruction, power point, and written materials to support subject.  Instructors discuss benefits of exercise, components of exercise, frequency and intensity of exercise, and end points for exercise.  Also discuss use of nitroglycerin and activating EMS.  Review options of places to exercise outside of rehab.  Review guidelines for sex with heart disease.   Cardiac Drugs I:  -Group instruction provided by verbal instruction and written materials to support subject.  Instructor reviews cardiac drug classes: antiplatelets, anticoagulants, beta blockers, and statins.  Instructor discusses reasons, side effects, and lifestyle considerations for each drug class.   CARDIAC REHAB PHASE II EXERCISE from 11/08/2016 in [place]  Date  08/30/16  Educator  Kennyth Lose  Instruction Review Code  2- meets goals/outcomes      Cardiac Drugs II:  -Group instruction provided by verbal instruction and written materials to support subject.  Instructor reviews cardiac drug classes: angiotensin converting enzyme inhibitors (ACE-I), angiotensin II receptor blockers (ARBs), nitrates, and calcium channel blockers.  Instructor  discusses reasons, side effects, and lifestyle considerations for each drug class.   CARDIAC REHAB PHASE II EXERCISE from 11/08/2016 in [facility]  CARDIAC REHAB  Date  11/01/16  Educator  Kennyth Lose  Instruction Review Code  2- meets goals/outcomes      Anatomy and Physiology of the Circulatory System:  Group verbal and written instruction and models provide basic cardiac anatomy and physiology, with the coronary electrical and arterial systems. Review of: AMI, Angina, Valve disease, Heart Failure, Peripheral Artery Disease, Cardiac Arrhythmia, Pacemakers, and the ICD.   CARDIAC REHAB PHASE II EXERCISE from 11/08/2016 in [place]  Date  10/18/16  Instruction Review Code  2- meets goals/outcomes      Other Education:  -Group or individual verbal, written, or video instructions that support the educational goals of the cardiac rehab program.   Knowledge Questionnaire Score:     Knowledge Questionnaire Score - 08/08/16 1234      Knowledge Questionnaire Score   Pre Score 21/24      Core Components/Risk Factors/Patient Goals at Admission:     Personal Goals and Risk Factors at Admission - 08/08/16 0924      Core Components/Risk Factors/Patient Goals on Admission    Weight Management Yes;Weight Loss;Obesity   Intervention Weight Management: Develop a combined nutrition and exercise program designed to reach desired caloric intake, while maintaining appropriate intake of nutrient and fiber, sodium and fats, and appropriate energy expenditure required for the weight goal.;Weight Management: Provide education and appropriate resources to help participant work on and attain dietary goals.;Weight Management/Obesity: Establish reasonable short term and long term weight goals.;Obesity: Provide education and appropriate resources to help participant work on and attain dietary goals.   Expected Outcomes Short Term: Continue to assess and modify  interventions until short term weight is achieved;Weight Maintenance: Understanding of the daily nutrition guidelines, which includes 25-35% calories from fat, 7% or less cal from saturated fats, less than 224m cholesterol, less than 1.5gm of sodium, & 5 or more servings of fruits and vegetables daily;Weight Loss: Understanding of general recommendations for a balanced deficit meal plan, which promotes 1-2 lb weight loss per week and includes a negative energy balance of [phone] kcal/d;Understanding recommendations for meals to include 15-35% energy as protein, 25-35% energy from fat, 35-60% energy from carbohydrates, less than 2046mof dietary cholesterol, 20-35 gm of total fiber daily;Understanding of distribution of calorie intake throughout the day with the consumption of 4-5 meals/snacks   Diabetes Yes   Intervention Provide education about signs/symptoms and action to take for hypo/hyperglycemia.;Provide education about proper nutrition, including hydration, and aerobic/resistive exercise prescription along with prescribed medications to achieve blood glucose in normal ranges: Fasting glucose 65-99 mg/dL   Expected Outcomes Short Term: Participant verbalizes understanding of the signs/symptoms and immediate care of hyper/hypoglycemia, proper foot care and importance of medication, aerobic/resistive exercise and nutrition plan for blood glucose control.;Long Term: Attainment of HbA1C < 7%.   Hypertension Yes   Intervention Provide education on lifestyle modifcations including regular physical activity/exercise, weight management, moderate sodium restriction and increased consumption of fresh fruit, vegetables, and low fat dairy, alcohol moderation, and smoking cessation.;Monitor prescription use compliance.   Expected Outcomes Short Term: Continued assessment and intervention until BP is < 140/9037mG in hypertensive participants. < 130/46m82m in hypertensive participants with diabetes, heart failure  or chronic kidney disease.;Long Term: Maintenance of blood pressure at goal levels.   Personal Goal Other Yes   Personal Goal goal weight at 210, and to drop one pants size. Currently at 48 a3 desired pants size 46   Intervention Provide exercise programming and nutrition couseling to  assist with weightloss goals    Expected Outcomes Pt will lose wt and get to desired pants size of 46.      Core Components/Risk Factors/Patient Goals Review:      Goals and Risk Factor Review    Row Name 09/12/16 8841 10/11/16 1645 11/08/16 1105         Core Components/Risk Factors/Patient Goals Review   Personal Goals Review Weight Management/Obesity;Hypertension;Diabetes Weight Management/Obesity;Hypertension;Diabetes Weight Management/Obesity;Hypertension;Diabetes     Review pt c/o persistent dyspnea on exertion.  pt has been instructed in Pursed lip breathing techniques.  pt is continuing weight loss efforts.   pt c/o persistant dyspnea on exertion and fatigue, which he notes are improving. pt is continuing weight loss efforts.  pt c/o persistant dyspnea on exertion and fatigue, which he notes are improving.  pt reports fatigue almost resolved, however DOE persists with walking to mailbox.  pt denies symptoms at cardiac rehab.     pt is continuing weight loss efforts.      Expected Outcomes pt will participate in CR activities of exercise, nutrition and life style modification education to decrease overall CAD risk factors.   pt will participate in CR activities of exercise, nutrition and life style modification education to decrease overall CAD risk factors.   pt will participate in CR activities of exercise, nutrition and life style modification education to decrease overall CAD risk factors.          Core Components/Risk Factors/Patient Goals at Discharge (Final Review):      Goals and Risk Factor Review - 11/08/16 1105      Core Components/Risk Factors/Patient Goals Review   Personal Goals Review  Weight Management/Obesity;Hypertension;Diabetes   Review pt c/o persistant dyspnea on exertion and fatigue, which he notes are improving.  pt reports fatigue almost resolved, however DOE persists with walking to mailbox.  pt denies symptoms at cardiac rehab.     pt is continuing weight loss efforts.    Expected Outcomes pt will participate in CR activities of exercise, nutrition and life style modification education to decrease overall CAD risk factors.        ITP Comments:     ITP Comments    Row Name 08/08/16 0920 09/01/16 0905 11/07/16 1618       ITP Comments Dr. Fransico Him, Medical Director 09/01/16, HTN, meets goals/outcomes Dr. Fransico Him, Medical Director        Comments: Pt is making expected progress toward personal goals after completing 33 sessions. Recommend continued exercise and life style modification education including  stress management and relaxation techniques to decrease cardiac risk profile.

## 2016-11-10 ENCOUNTER — Encounter (HOSPITAL_COMMUNITY)
Admission: RE | Admit: 2016-11-10 | Discharge: 2016-11-10 | Disposition: A | Discharge status: Home / Self Care | Payer: PPO | Source: Ambulatory Visit | Attending: Internal Medicine | Admitting: Internal Medicine

## 2016-11-10 ENCOUNTER — Encounter (HOSPITAL_COMMUNITY): Payer: Self-pay

## 2016-11-10 ENCOUNTER — Ambulatory Visit (HOSPITAL_COMMUNITY): Payer: Self-pay | Admitting: Cardiac Rehabilitation

## 2016-11-10 DIAGNOSIS — Z953 Presence of xenogenic heart valve: Secondary | ICD-10-CM | POA: Diagnosis not present

## 2016-11-10 DIAGNOSIS — Z952 Presence of prosthetic heart valve: Secondary | ICD-10-CM

## 2016-11-13 ENCOUNTER — Encounter (HOSPITAL_COMMUNITY): Payer: PPO

## 2016-11-15 ENCOUNTER — Encounter (HOSPITAL_COMMUNITY): Payer: PPO

## 2016-11-17 ENCOUNTER — Encounter (HOSPITAL_COMMUNITY): Payer: PPO

## 2016-11-20 ENCOUNTER — Encounter (HOSPITAL_COMMUNITY): Payer: PPO

## 2016-11-22 ENCOUNTER — Encounter (HOSPITAL_COMMUNITY): Payer: PPO

## 2016-11-24 ENCOUNTER — Encounter (HOSPITAL_COMMUNITY): Payer: PPO

## 2016-11-27 ENCOUNTER — Encounter (HOSPITAL_COMMUNITY): Payer: PPO

## 2016-11-29 ENCOUNTER — Encounter (HOSPITAL_COMMUNITY): Payer: PPO

## 2016-12-01 ENCOUNTER — Encounter (HOSPITAL_COMMUNITY): Payer: PPO

## 2016-12-01 ENCOUNTER — Ambulatory Visit (INDEPENDENT_AMBULATORY_CARE_PROVIDER_SITE_OTHER): Payer: PPO

## 2016-12-01 DIAGNOSIS — Z5181 Encounter for therapeutic drug level monitoring: Secondary | ICD-10-CM | POA: Diagnosis not present

## 2016-12-01 LAB — POCT INR: INR: 2.8

## 2016-12-01 NOTE — Patient Instructions (Signed)
Pre visit review using our clinic review tool, if applicable. No additional management support is needed unless otherwise documented below in the visit note. 

## 2016-12-04 ENCOUNTER — Encounter (HOSPITAL_COMMUNITY): Payer: PPO

## 2016-12-06 ENCOUNTER — Encounter (HOSPITAL_COMMUNITY): Payer: PPO

## 2016-12-07 NOTE — Addendum Note (Signed)
Encounter addended by: Dorna Bloom D on: 12/07/2016 12:37 PM<BR>    Actions taken: Flowsheet accepted, Flowsheet data copied forward, Visit Navigator Flowsheet section accepted

## 2016-12-08 ENCOUNTER — Encounter (HOSPITAL_COMMUNITY): Payer: PPO

## 2016-12-11 ENCOUNTER — Encounter (HOSPITAL_COMMUNITY): Payer: PPO

## 2016-12-22 NOTE — Addendum Note (Signed)
Encounter addended by: Jewel Baize, RD on: 12/22/2016 12:13 PM<BR>    Actions taken: Flowsheet data copied forward, Visit Navigator Flowsheet section accepted

## 2016-12-26 NOTE — Progress Notes (Signed)
Discharge Summary  Patient Details  Name: Bill [name] MRN: [id number] Date of Birth: 29-Jan-1941 Referring Provider:     [place] from 08/08/2016 in [place]  Referring Provider  Mikle Bosworth       Number of Visits:  36   Reason for Discharge:  Patient reached a stable level of exercise.  Smoking History:  History  Smoking Status  . Never Smoker  Smokeless Tobacco  . Never Used    Diagnosis:  S/P AVR  ADL UCSD:   Initial Exercise Prescription:   Discharge Exercise Prescription (Final Exercise Prescription Changes):     Exercise Prescription Changes - 11/10/16 1217      Response to Exercise   Blood Pressure (Admit) 138/58   Blood Pressure (Exercise) 140/70   Blood Pressure (Exit) 122/64   Heart Rate (Admit) 76 bpm   Heart Rate (Exercise) 95 bpm   Heart Rate (Exit) 76 bpm   Rating of Perceived Exertion (Exercise) 12   Symptoms none   Duration Continue with 30 min of aerobic exercise without signs/symptoms of physical distress.   Intensity THRR unchanged     Progression   Progression Continue to progress workloads to maintain intensity without signs/symptoms of physical distress.   Average METs 2.1     Resistance Training   Training Prescription Yes   Weight 4lbs   Reps 10-15   Time 10 Minutes     NuStep   Level 5   SPM 90   Minutes 10   METs 2.2     Arm Ergometer   Level 1   Minutes 10   METs 2.23     Track   Laps 6   Minutes 10   METs 2.03     Home Exercise Plan   Plans to continue exercise at Home (comment)   Frequency Add 2 additional days to program exercise sessions.   Initial Home Exercises Provided 08/25/16      Functional Capacity:     6 Minute Walk    Row Name 11/06/16 1032 11/06/16 1048       6 Minute Walk   Phase Discharge  -    Distance 1344 feet  -    Distance % Change 10.07 %  -    Walk Time 6 minutes  -    # of Rest Breaks 0  -    MPH 2.55  -     METS 2.51  -    RPE 10  -    Perceived Dyspnea  1  -    VO2 Peak 8.77  -    Symptoms Yes (comment)  -    Comments SOB  -    Resting HR 88 bpm  -    Resting BP 138/58  -    Max Ex. HR 101 bpm  -    Max Ex. BP 142/60  -    2 Minute Post BP  - 118/60       Psychological, QOL, Others - Outcomes: PHQ 2/9: Depression screen [facility] 2/9 11/10/2016 09/07/2016 08/14/2016 02/21/2016 09/14/2015  Decreased Interest 0 0 0 0 0  Down, Depressed, Hopeless 0 0 0 0 0  PHQ - 2 Score 0 0 0 0 0  Some recent data might be hidden    Quality of Life:     Quality of Life - 11/10/16 1544      Quality of Life Scores   Health/Function Pre 25.53 %  Health/Function Post 25.77 %   Health/Function % Change 0.94 %   Socioeconomic Pre 26.2 %   Socioeconomic Post 25.42 %   Socioeconomic % Change  -2.98 %   Psych/Spiritual Pre 26.57 %   Psych/Spiritual Post 26.79 %   Psych/Spiritual % Change 0.83 %   Family Pre 28.5 %   Family Post 28 %   Family % Change -1.75 %   GLOBAL Pre 26.33 %   GLOBAL Post 26.26 %   GLOBAL % Change -0.27 %      Personal Goals: Goals established at orientation with interventions provided to work toward goal.     Personal Goals and Risk Factors at Admission - 12/07/16 1222      Core Components/Risk Factors/Patient Goals on Admission   Personal Goal Pt is making great progress towards weightloss goals. Pt is currently down ~6lbs.    Expected Outcomes Pt will lose wt and get to desired pants size of 46.       Personal Goals Discharge:     Goals and Risk Factor Review    Row Name 09/12/16 7253 10/11/16 1645 11/08/16 1105         Core Components/Risk Factors/Patient Goals Review   Personal Goals Review Weight Management/Obesity;Hypertension;Diabetes Weight Management/Obesity;Hypertension;Diabetes Weight Management/Obesity;Hypertension;Diabetes     Review pt c/o persistent dyspnea on exertion.  pt has been instructed in Pursed lip breathing techniques.  pt is continuing  weight loss efforts.   pt c/o persistant dyspnea on exertion and fatigue, which he notes are improving. pt is continuing weight loss efforts.  pt c/o persistant dyspnea on exertion and fatigue, which he notes are improving.  pt reports fatigue almost resolved, however DOE persists with walking to mailbox.  pt denies symptoms at cardiac rehab.     pt is continuing weight loss efforts.      Expected Outcomes pt will participate in CR activities of exercise, nutrition and life style modification education to decrease overall CAD risk factors.   pt will participate in CR activities of exercise, nutrition and life style modification education to decrease overall CAD risk factors.   pt will participate in CR activities of exercise, nutrition and life style modification education to decrease overall CAD risk factors.          Nutrition & Weight - Outcomes:      Post Biometrics - 11/06/16 1035       Post  Biometrics   Height 6\' 1"  (1.854 m)   Weight 232 lb 9.4 oz (105.5 kg)   Waist Circumference 41 inches   Hip Circumference 48 inches   Waist to Hip Ratio 0.85 %   BMI (Calculated) 30.8   Triceps Skinfold 24 mm   % Body Fat 30.8 %   Grip Strength 36 kg   Flexibility 12 in   Single Leg Stand 2 seconds      Nutrition:   Nutrition Discharge:     Nutrition Assessments - 12/22/16 1210      MEDFICTS Scores   Pre Score 89   Post Score 42   Score Difference -47      Education Questionnaire Score:     Knowledge Questionnaire Score - 11/10/16 1541      Knowledge Questionnaire Score   Post Score 21/24      Goals reviewed with patient; copy given to patient.

## 2017-01-12 ENCOUNTER — Ambulatory Visit (INDEPENDENT_AMBULATORY_CARE_PROVIDER_SITE_OTHER): Payer: PPO

## 2017-01-12 DIAGNOSIS — Z5181 Encounter for therapeutic drug level monitoring: Secondary | ICD-10-CM | POA: Diagnosis not present

## 2017-01-12 LAB — POCT INR: INR: 2

## 2017-01-12 NOTE — Patient Instructions (Signed)
Pre visit review using our clinic review tool, if applicable. No additional management support is needed unless otherwise documented below in the visit note. 

## 2017-01-31 ENCOUNTER — Other Ambulatory Visit: Payer: Self-pay

## 2017-01-31 MED ORDER — WARFARIN SODIUM 2.5 MG PO TABS: 2.5000 mg | ORAL_TABLET | Freq: Every day | ORAL | 1 refills | Status: DC

## 2017-01-31 NOTE — Telephone Encounter (Signed)
Patient requests refill on his warfarin and a 90 days supply.  He is compliant with coumadin management, will refill X 6 mth supply.

## 2017-02-05 ENCOUNTER — Encounter: Payer: Self-pay | Admitting: Thoracic Surgery (Cardiothoracic Vascular Surgery)

## 2017-02-05 ENCOUNTER — Ambulatory Visit (INDEPENDENT_AMBULATORY_CARE_PROVIDER_SITE_OTHER): Payer: PPO | Admitting: Thoracic Surgery (Cardiothoracic Vascular Surgery)

## 2017-02-05 VITALS — BP 141/72 | HR 86 | Resp 16 | Ht 74.0 in | Wt 228.0 lb

## 2017-02-05 DIAGNOSIS — Z953 Presence of xenogenic heart valve: Secondary | ICD-10-CM

## 2017-02-05 DIAGNOSIS — Q211 Atrial septal defect: Secondary | ICD-10-CM

## 2017-02-05 DIAGNOSIS — Z8679 Personal history of other diseases of the circulatory system: Secondary | ICD-10-CM | POA: Diagnosis not present

## 2017-02-05 DIAGNOSIS — Q2112 Patent foramen ovale: Secondary | ICD-10-CM

## 2017-02-05 DIAGNOSIS — Z9889 Other specified postprocedural states: Secondary | ICD-10-CM | POA: Diagnosis not present

## 2017-02-05 NOTE — Patient Instructions (Signed)

## 2017-02-05 NOTE — Progress Notes (Signed)
[address]       [place] [address]             [phone]     CARDIOTHORACIC SURGERY OFFICE NOTE  Referring Provider is [name], MD PCP is [name], MD   HPI:  Patient is a 76 year old male with history of aortic stenosis, chronic persistent atrial fibrillation, chronic diastolic congestive heart failure, hypertension, type 2 diabetes mellitus with complications, stage IIIchronic kidney disease, and somewhat limited physical mobility with chronic tremor who returns to the office for routine follow-up approximately 3 months status post aortic valve replacement using a bioprosthetic tissue valve, Maze procedure, and closure of patent foramen ovale on 01/27/2016. His early postoperative recovery was uncomplicated and follow-up echocardiogram performed 01/28/2016 revealed a normal functioning bioprosthetic tissue valve in the aortic position with normal left ventricular systolic function, ejection fraction estimated 65-70%. He was last seen here in follow-up in our office on 05/08/2016.  Since then he has done well clinically. He has been seen in follow-up post by Dr. [name] and more recently by Dr. Lovena Le. Twelve-lead EKG performed last April revealed rate controlled atrial fibrillation. Dofetilide was stopped at that time.  He returns for office today and reports that he is doing quite well. He states that overall he feels somewhat improved in comparison with how he felt prior to surgery. He still gets short of breath with physical exertion but he is only slightly limited by exertional shortness of breath and overall his breathing is better than it was prior to surgery. He never has any palpitations. He remains anticoagulated using warfarin. He has not had any bleeding problems nor any symptoms to suggest possible TIA or stroke.   Current Outpatient Prescriptions  Medication Sig Dispense Refill  . acetaminophen (TYLENOL) 325 MG tablet Take 650 mg by  mouth every 4 (four) hours as needed for mild pain or fever.    . cholecalciferol (VITAMIN D) 1000 UNITS tablet Take 1,000 Units by mouth as directed.     Marland Kitchen glipiZIDE (GLUCOTROL) 5 MG tablet Take 1 tablet (5 mg total) by mouth daily before breakfast. 90 tablet 3  . glucose blood (ONE TOUCH ULTRA TEST) test strip Use to check sugar once daily and as needed. Dx:E11.319 **One Touch Ultra** 100 each 2  . lisinopril (ZESTRIL) 2.5 MG tablet Take 1 tablet (2.5 mg total) by mouth daily. 30 tablet 6  . Magnesium Oxide 250 MG TABS Take 1 tablet by mouth daily as needed (supplement).     . metFORMIN (GLUCOPHAGE) 500 MG tablet TAKE 1 TABLET (500 MG TOTAL) BY MOUTH 2 (TWO) TIMES DAILY WITH A MEAL. 180 tablet 1  . Multiple Vitamin (MULTIVITAMIN WITH MINERALS) TABS tablet Take by mouth daily. 1 packet of 5 vitamins daily    . multivitamin-lutein (OCUVITE-LUTEIN) CAPS capsule Take 3 capsules by mouth as directed.     Marland Kitchen oxybutynin (DITROPAN) 5 MG tablet Take 1 tablet (5 mg total) by mouth 3 (three) times daily. 270 tablet 3  . pravastatin (PRAVACHOL) 20 MG tablet Take 1 tablet (20 mg total) by mouth every evening. 90 tablet 3  . vitamin B-12 (CYANOCOBALAMIN) 1000 MCG tablet Take 1,000 mcg by mouth as directed.     . warfarin (COUMADIN) 2.5 MG tablet Take 1 tablet (2.5 mg total) by mouth daily at 6 PM. Or as directed. 35 tablet 2  . warfarin (COUMADIN) 2.5 MG tablet Take 1 tablet (2.5 mg total) by mouth daily at 6  PM. Or as directed. 90 tablet 1   No current facility-administered medications for this visit.       Physical Exam:   BP (!) 141/72 (BP Location: Right Arm, Patient Position: Sitting, Cuff Size: Large)   Pulse 86   Resp 16   Ht 6\' 2"  (1.88 m)   Wt 228 lb (103.4 kg)   SpO2 96% Comment: ON RA  BMI 29.27 kg/m   General:  Well-appearing with baseline resting tremor  Chest:   Clear to auscultation  CV:   Regular rate and rhythm with grade 2/6 systolic murmur  Incisions:  Completely  healed  Abdomen:  Soft nontender  Extremities:  Warm and well-perfused  Diagnostic Tests:  2 channel telemetry rhythm strip demonstrates regular narrow complex rhythm. There are no P waves. This may be atrial fibrillation although the RR interval is fairly constant.   Impression:  Patient is doing well more than one year status post aortic valve replacement using a bioprosthetic tissue valve and Maze procedure. He appears to be in rate controlled atrial fibrillation. He is clinically quite stable and describes stable symptoms of exertional shortness breath consistent with chronic diastolic congestive heart failure, New York Heart Association functional class II. Most recent follow-up echocardiogram performed nearly 1 year ago revealed normal left ventricular systolic function with normal functioning bioprosthetic tissue valve in the aortic position.  Plan:  We have not recommended any changes to the patient's current medications. He will continue to follow-up intermittently with Dr. [name] with whom he has an appointment scheduled for later this month.  The patient has been reminded regarding the importance of dental hygiene and the lifelong need for antibiotic prophylaxis for all dental cleanings and other related invasive procedures.  In the future he will call and return to see [place] only should specific problems or questions arise.  I spent in excess of 15 minutes during the conduct of this office consultation and >50% of this time involved direct face-to-face encounter with the patient for counseling and/or coordination of their care.    Valentina Gu. Roxy Manns, MD 02/05/2017 9:45 AM

## 2017-02-07 ENCOUNTER — Telehealth: Payer: Self-pay | Admitting: Cardiovascular Disease

## 2017-02-07 NOTE — Telephone Encounter (Signed)
Patient reports he received a letter in the mail from [facility] about denial of lab payment for Coumadin. Informed the patient that the Billing department will call him to answer his questions and address his concerns.  He was grateful for call.

## 2017-02-07 NOTE — Telephone Encounter (Signed)
New message    Pt is calling asking for a call back. He has a question about a letter he received.

## 2017-02-13 ENCOUNTER — Other Ambulatory Visit: Payer: Self-pay | Admitting: Family Medicine

## 2017-02-16 DIAGNOSIS — H40023 Open angle with borderline findings, high risk, bilateral: Secondary | ICD-10-CM | POA: Diagnosis not present

## 2017-02-19 ENCOUNTER — Ambulatory Visit (INDEPENDENT_AMBULATORY_CARE_PROVIDER_SITE_OTHER): Payer: PPO | Admitting: Cardiovascular Disease

## 2017-02-19 ENCOUNTER — Encounter: Payer: Self-pay | Admitting: Cardiovascular Disease

## 2017-02-19 VITALS — BP 128/60 | HR 81 | Ht 74.0 in | Wt 234.8 lb

## 2017-02-19 DIAGNOSIS — I5032 Chronic diastolic (congestive) heart failure: Secondary | ICD-10-CM | POA: Diagnosis not present

## 2017-02-19 DIAGNOSIS — Z953 Presence of xenogenic heart valve: Secondary | ICD-10-CM | POA: Diagnosis not present

## 2017-02-19 NOTE — Patient Instructions (Signed)

## 2017-02-19 NOTE — Progress Notes (Signed)
Cardiology Office Note Date:  02/20/2017   ID:  Bill [name], DOB 24-Jan-1941, MRN [id number]  PCP:  [name], MD  Cardiologist:  [name], MD    Chief Complaint  Patient presents with  . Follow-up   History of Present Illness: Bill [name] is a 76 y.o. male who presents for follow-up of aortic valve disease, persistent atrial fibrillation, and chronic diastolic heart failure.   He underwent bioprosthetic aortic valve replacement, Maze procedure, and clipping of the left atrial appendage in September 2017. He has had persistent atrial fibrillation postoperatively dofetilide was ultimately discontinued when he remained in atrial fibrillation.  He has been on ASA and warfarin.   He's here alone today. Has no chest pain. Mild DOE is unchanged. No edema, orthopnea, PND, or heart palpitations.   Past Medical History:  Diagnosis Date  . Aortic stenosis 12/2010   s/p bioprosthetic AVR 9/17 // Echo 6/17: EF 55-60, mean AV 25 mmHg // TEE 6/17: severe AS, AVA 0.93, mean AV 43 mmHg // post op Echo 02/07/16: mod conc LVH, EF 65-70, vigorous LVF, AVR ok, mean 9 mmHg, MAC, trivial MR, mod LAE, trivial pericardial eff  . Chronic diastolic CHF (congestive heart failure) ([place])   . Coronary artery disease    LHC 8/17: pLAD 20, mLAD 30, dLAD 20, mRCA 30, dRCA 20, RPDA 20  . Exogenous obesity   . GERD (gastroesophageal reflux disease)    occasional  . History of chicken pox   . Hypertension   . Kidney stones remote  . Lyme disease 2012   ?titers negative  . Neuromuscular disorder (HCC)    tremor right arm  . Persistent atrial fibrillation (HCC)    Tikosyn // s/p Maze procedure 9/17 // Anticoagulation undesirable 2/2 hx of retinal hemorrhage (worked up for [employer] but referred for Maze at time of AVR 2/2 aortic stenosis)   . PFO (patent foramen ovale)    s/p PFO closure at time of AVR in 9/17  . S/P aortic valve replacement with bioprosthetic valve 01/27/2016   27 mm [facility] Ease bovine pericardial tissue valve  . S/P Maze operation for atrial fibrillation 01/27/2016   Complete bilateral atrial lesion set using bipolar radiofrequency and cryothermy ablation with clipping of LA appendage  . Type 2 diabetes, controlled, with retinopathy ([place]) 1980s  . Urge incontinence   . Vitamin B12 deficiency    IF normal (2014)    Past Surgical History:  Procedure Laterality Date  . AORTIC VALVE REPLACEMENT N/A 01/27/2016   Procedure: AORTIC VALVE REPLACEMENT (AVR);  Surgeon: Rexene Alberts, MD;  Location: [place];  Service: Open Heart Surgery;  Laterality: N/A;  . BACK SURGERY  20 yrs ago   removal of lower back cartilage due to damage. Patient does not remember date of surgery.  Marland Kitchen CARDIAC CATHETERIZATION N/A 01/05/2016   Procedure: Right/Left Heart Cath and Coronary Angiography;  Surgeon: Burnell Blanks, MD;  Location: [place] CV LAB;  Service: Cardiovascular;  Laterality: N/A;  . CARDIOVERSION N/A 02/25/2015   Procedure: CARDIOVERSION;  Surgeon: Skeet Latch, MD;  Location: [facility] ENDOSCOPY;  Service: Cardiovascular;  Laterality: N/A;  . CATARACT EXTRACTION Bilateral 2015   [place] [facility] [place]  . COLONOSCOPY    . EYE SURGERY     left eye "cleaned"  . finger sx    . kidney stone removal    . MAZE N/A 01/27/2016   Procedure: MAZE;  Surgeon: Rexene Alberts, MD;  Location: [place];  Service: Open Heart Surgery;  Laterality: N/A;  . TEE WITHOUT CARDIOVERSION N/A 11/05/2015   Procedure: TRANSESOPHAGEAL ECHOCARDIOGRAM (TEE);  Surgeon: Pixie Casino, MD;  Location: [place];  Service: Cardiovascular;  Laterality: N/A;  . TEE WITHOUT CARDIOVERSION N/A 01/27/2016   Procedure: TRANSESOPHAGEAL ECHOCARDIOGRAM (TEE);  Surgeon: Rexene Alberts, MD;  Location: [place];  Service: Open Heart Surgery;  Laterality: N/A;  . TOTAL HIP ARTHROPLASTY  03/12/2012   RIGHT TOTAL HIP ARTHROPLASTY ANTERIOR APPROACH;  Surgeon: Mcarthur Rossetti, MD;  . US ECHOCARDIOGRAPHY   12/2010   EF 50-55%, mild LVH, normal wall motion, high LV filling pressures, mild AS, LA mildly dilated    Current Outpatient Prescriptions  Medication Sig Dispense Refill  . acetaminophen (TYLENOL) 325 MG tablet Take 650 mg by mouth every 4 (four) hours as needed for mild pain or fever.    . cholecalciferol (VITAMIN D) 1000 UNITS tablet Take 1,000 Units by mouth as directed.     Marland Kitchen glipiZIDE (GLUCOTROL) 5 MG tablet Take 1 tablet (5 mg total) by mouth daily before breakfast. 90 tablet 3  . lisinopril (ZESTRIL) 2.5 MG tablet Take 1 tablet (2.5 mg total) by mouth daily. 30 tablet 6  . Magnesium Oxide 250 MG TABS Take 1 tablet by mouth daily as needed (supplement).     . metFORMIN (GLUCOPHAGE) 500 MG tablet TAKE 1 TABLET (500 MG TOTAL) BY MOUTH 2 (TWO) TIMES DAILY WITH A MEAL. 180 tablet 1  . Multiple Vitamin (MULTIVITAMIN WITH MINERALS) TABS tablet Take by mouth daily. 1 packet of 5 vitamins daily    . multivitamin-lutein (OCUVITE-LUTEIN) CAPS capsule Take 3 capsules by mouth as directed.     . ONE TOUCH ULTRA TEST test strip use to check sugar once daily. 100 each 1  . oxybutynin (DITROPAN) 5 MG tablet Take 1 tablet (5 mg total) by mouth 3 (three) times daily. 270 tablet 3  . pravastatin (PRAVACHOL) 20 MG tablet Take 20 mg by mouth daily.    . vitamin B-12 (CYANOCOBALAMIN) 1000 MCG tablet Take 1,000 mcg by mouth as directed.     . warfarin (COUMADIN) 2.5 MG tablet Take 1 tablet (2.5 mg total) by mouth daily at 6 PM. Or as directed. 90 tablet 1   No current facility-administered medications for this visit.     Allergies:   No known allergies   Social History:  The patient  reports that he has never smoked. He has never used smokeless tobacco. He reports that he drinks alcohol. He reports that he does not use drugs.   Family History:  The patient's  family history includes Cancer in his brother; Diabetes in his father and mother; Heart disease in his father and mother.    ROS:  Please see  the history of present illness.   All other systems are reviewed and negative.    PHYSICAL EXAM: VS:  BP 128/60   Pulse 81   Ht _0  (1.88 m)   Wt 106.5 kg (234 lb 12.8 oz)   BMI 30.15 kg/m  , BMI Body mass index is 30.15 kg/m. GEN: Well nourished, well developed, in no acute distress  HEENT: normal  Neck: no JVD, no masses. No carotid bruits Cardiac: RRR with 2/6 systolic murmur at the RUSB        Respiratory:  clear to auscultation bilaterally, normal work of breathing GI: soft, nontender, nondistended, + BS MS: no deformity or atrophy  Ext: no pretibial edema, pedal pulses 2+= bilaterally Skin: warm  and dry, no rash Neuro:  Strength and sensation are intact Psych: euthymic mood, full affect  EKG:  EKG is ordered today. The ekg ordered today shows atrial fibrillation with competing junctional rhythm, age-indeterminate inferior infarct, cannot rule out age-indeterminate anterior infarct.   Recent Labs: 09/07/2016: ALT 15; Hemoglobin 11.7; Platelets 208.0 09/26/2016: BUN 20; Creatinine, Ser 1.27; Potassium 4.3; Sodium 137   Lipid Panel     Component Value Date/Time   CHOL 131 09/07/2016 1309   TRIG 65.0 09/07/2016 1309   TRIG 71 04/17/2006 1259   HDL 59.90 09/07/2016 1309   CHOLHDL 2 09/07/2016 1309   VLDL 13.0 09/07/2016 1309   LDLCALC 58 09/07/2016 1309   LDLDIRECT 124.4 12/31/2012 0739      Wt Readings from Last 3 Encounters:  02/19/17 106.5 kg (234 lb 12.8 oz)  02/05/17 103.4 kg (228 lb)  11/06/16 105.5 kg (232 lb 9.4 oz)     Cardiac Studies Reviewed: Echo 02/07/2016: Study Conclusions  - Left ventricle: The cavity size was normal. There was moderate   concentric hypertrophy. Systolic function was vigorous. The   estimated ejection fraction was in the range of 65% to 70%. Wall   motion was normal; there were no regional wall motion   abnormalities. - Aortic valve: A 27 mm [facility] Ease Bovine pericardial   bioprosthetic valve sits well in the aortic  position. There is no   abnormal rocking motion. No aortic regurgitation or paravalvular   leak. Normal transaortic gradients. Mean gradient (S): 9 mm Hg.   Peak gradient (S): 14 mm Hg. - Aortic root: The aortic root was normal in size. - Ascending aorta: The ascending aorta was normal in size. - Mitral valve: Calcified annulus. Mildly thickened leaflets .   There was trivial regurgitation. - Left atrium: The atrium was moderately dilated. - Right ventricle: The cavity size was normal. Wall thickness was   normal. Systolic function was normal. - Right atrium: The atrium was normal in size. - Pulmonary arteries: Systolic pressure was within the normal   range. - Inferior vena cava: The vessel was normal in size. - Pericardium, extracardiac: A trivial pericardial effusion was   identified posterior to the heart. Features were not consistent   with tamponade physiology.  Impressions:  - Since the last study on 11/05/2015 there is now a new 27 mm   [facility] Ease Bovine pericardial bioprosthetic valve in the   aortic position. There is no abnormal rocking motion. No aortic   regurgitation or paravalvular leak. Normal transaortic gradients.   ASSESSMENT AND PLAN: 1.  Persistent atrial fibrillation: pt s/p Maze/LAA ligation. Has been in AF at follow-up. Discussed issues around anticoagulation after LAA ligation. As he is tolerating warfarin and has high CHADS-Vasc of 6, favor continuation of anticoagulation at this point. He is agreeable. Today's EKG raises question of accelerated junctional rhythm. He seems to be tolerating with HR 80 bpm.   2. Aortic valve stenosis: s/p bioprosthetic AVR. Post-op echo shows normal valve function. Exam unchanged. Clinical FU one year  3. Chronic diastolic heart failure: NYHA II sx's. No volume excess on exam. Continue same Rx.   4. Hyperlipidemia: treated with pravastatin. Most recent lipids reviewed.   5. HTN: BP well-controlled on low-dose  lisinopril.   6. Type II DM: managed by PCP  Current medicines are reviewed with the patient today.  The patient does not have concerns regarding medicines.  Labs/ tests ordered today include:   Orders Placed This Encounter  Procedures  . EKG 12-Lead    Disposition:   FU one year  Signed, [name], MD  02/20/2017 5:31 PM    [address], [place], [place]  [address] Phone: [phone]; Fax: [phone]

## 2017-02-20 ENCOUNTER — Ambulatory Visit (INDEPENDENT_AMBULATORY_CARE_PROVIDER_SITE_OTHER): Payer: PPO

## 2017-02-20 DIAGNOSIS — Z7901 Long term (current) use of anticoagulants: Secondary | ICD-10-CM | POA: Diagnosis not present

## 2017-02-20 DIAGNOSIS — Z23 Encounter for immunization: Secondary | ICD-10-CM | POA: Diagnosis not present

## 2017-02-20 LAB — POCT INR: INR: 2.1

## 2017-02-20 NOTE — Patient Instructions (Signed)
Pre visit review using our clinic review tool, if applicable. No additional management support is needed unless otherwise documented below in the visit note. 

## 2017-03-12 DIAGNOSIS — H00012 Hordeolum externum right lower eyelid: Secondary | ICD-10-CM | POA: Diagnosis not present

## 2017-03-20 ENCOUNTER — Encounter: Payer: Self-pay | Admitting: Family Medicine

## 2017-03-20 ENCOUNTER — Ambulatory Visit (INDEPENDENT_AMBULATORY_CARE_PROVIDER_SITE_OTHER): Payer: PPO | Admitting: Family Medicine

## 2017-03-20 VITALS — BP 140/72 | HR 85 | Temp 97.6°F | Wt 232.0 lb

## 2017-03-20 DIAGNOSIS — N183 Chronic kidney disease, stage 3 unspecified: Secondary | ICD-10-CM

## 2017-03-20 DIAGNOSIS — I4821 Permanent atrial fibrillation: Secondary | ICD-10-CM

## 2017-03-20 DIAGNOSIS — E1122 Type 2 diabetes mellitus with diabetic chronic kidney disease: Secondary | ICD-10-CM | POA: Diagnosis not present

## 2017-03-20 DIAGNOSIS — I482 Chronic atrial fibrillation: Secondary | ICD-10-CM

## 2017-03-20 DIAGNOSIS — E538 Deficiency of other specified B group vitamins: Secondary | ICD-10-CM

## 2017-03-20 DIAGNOSIS — Z5181 Encounter for therapeutic drug level monitoring: Secondary | ICD-10-CM

## 2017-03-20 DIAGNOSIS — I10 Essential (primary) hypertension: Secondary | ICD-10-CM | POA: Diagnosis not present

## 2017-03-20 DIAGNOSIS — E11319 Type 2 diabetes mellitus with unspecified diabetic retinopathy without macular edema: Secondary | ICD-10-CM | POA: Diagnosis not present

## 2017-03-20 LAB — RENAL FUNCTION PANEL
ALBUMIN: 3.9 g/dL (ref 3.5–5.2)
BUN: 27 mg/dL — AB (ref 6–23)
CALCIUM: 10.7 mg/dL — AB (ref 8.4–10.5)
CO2: 26 meq/L (ref 19–32)
CREATININE: 1.15 mg/dL (ref 0.40–1.50)
Chloride: 105 mEq/L (ref 96–112)
GFR: 65.3 mL/min (ref >60.00)
GLUCOSE: 109 mg/dL — AB (ref 70–99)
Phosphorus: 3.3 mg/dL (ref 2.3–4.6)
Potassium: 5.5 mEq/L — ABNORMAL HIGH (ref 3.5–5.1)
Sodium: 139 mEq/L (ref 135–145)

## 2017-03-20 LAB — PROTIME-INR
INR: 2.7 ratio — ABNORMAL HIGH (ref 0.8–1.0)
Prothrombin Time: 29.1 s — ABNORMAL HIGH (ref 9.6–13.1)

## 2017-03-20 LAB — VITAMIN B12: VITAMIN B 12: 452 pg/mL (ref 211–911)

## 2017-03-20 LAB — HEMOGLOBIN A1C: HEMOGLOBIN A1C: 5.8 % (ref 4.6–6.5)

## 2017-03-20 NOTE — Assessment & Plan Note (Signed)
On coumadin 

## 2017-03-20 NOTE — Assessment & Plan Note (Signed)
Chronic, stable. Continue current regimen. Update A1c.

## 2017-03-20 NOTE — Assessment & Plan Note (Signed)
Will see if mandy can see patient today. If not will add INR to blood work.

## 2017-03-20 NOTE — Patient Instructions (Addendum)
Good to see you today. You are doing well. A1c today. We will see if Bill [name] can see you today.  Return as needed or in 6 months for medicare wellness with Bill [name] and physical with me Check for biotin in your multivitamin.

## 2017-03-20 NOTE — Progress Notes (Addendum)
BP 140/72 (BP Location: Left Arm, Patient Position: Sitting, Cuff Size: Normal)   Pulse 85   Temp 97.6 F (36.4 C) (Oral)   Wt 232 lb (105.2 kg)   SpO2 97%   BMI 29.79 kg/m    CC: 6 mo f/u visit Subjective:    Patient ID: Bill [name], male    DOB: 1940-12-14, 76 y.o.   MRN: [id number]  HPI: Bill [name] is a 76 y.o. male presenting on 03/20/2017 for 6 mo follow-up   Latest cardiology note reviewed. H/o persistent afib sp Maze and LAA ligation, on coumadin managed by our coumadin RN. Also s/p bioprosthetic AVR for AV stenosis.   Increasing R knee pain noted. Denies inciting trauma/injury.   DM - does regularly check sugars and well controlled. Compliant with antihyperglycemic regimen which includes: metformin 500mg  bid and glipizide 5mg  daily. Denies low sugars or hypoglycemic symptoms. Denies paresthesias. Last diabetic eye exam 07/2016. Pneumovax: 2007. Prevnar: 2015. Glucometer brand: one-touch.  Lab Results  Component Value Date   HGBA1C 6.1 09/07/2016   Diabetic Foot Exam - Simple   Simple Foot Form Diabetic Foot exam was performed with the following findings:  Yes 03/20/2017  8:37 AM  Visual Inspection No deformities, no ulcerations, no other skin breakdown bilaterally:  Yes Sensation Testing See comments:  Yes Pulse Check See comments:  Yes Comments 2+ DP on right, 1+ DP on left Diminished sensation to monofilament testing R sole    Lab Results  Component Value Date   MICROALBUR 90.0 (H) 09/07/2016     Relevant past medical, surgical, family and social history reviewed and updated as indicated. Interim medical history since our last visit reviewed. Allergies and medications reviewed and updated. Outpatient Medications Prior to Visit  Medication Sig Dispense Refill  . acetaminophen (TYLENOL) 325 MG tablet Take 650 mg by mouth every 4 (four) hours as needed for mild pain or fever.    . cholecalciferol (VITAMIN D) 1000 UNITS tablet Take 1,000 Units by  mouth as directed.     Marland Kitchen glipiZIDE (GLUCOTROL) 5 MG tablet Take 1 tablet (5 mg total) by mouth daily before breakfast. 90 tablet 3  . lisinopril (ZESTRIL) 2.5 MG tablet Take 1 tablet (2.5 mg total) by mouth daily. 30 tablet 6  . Magnesium Oxide 250 MG TABS Take 1 tablet by mouth daily as needed (supplement).     . metFORMIN (GLUCOPHAGE) 500 MG tablet TAKE 1 TABLET (500 MG TOTAL) BY MOUTH 2 (TWO) TIMES DAILY WITH A MEAL. 180 tablet 1  . Multiple Vitamin (MULTIVITAMIN WITH MINERALS) TABS tablet Take by mouth daily. 1 packet of 5 vitamins daily    . multivitamin-lutein (OCUVITE-LUTEIN) CAPS capsule Take 3 capsules by mouth as directed.     . ONE TOUCH ULTRA TEST test strip use to check sugar once daily. 100 each 1  . oxybutynin (DITROPAN) 5 MG tablet Take 1 tablet (5 mg total) by mouth 3 (three) times daily. 270 tablet 3  . pravastatin (PRAVACHOL) 20 MG tablet Take 20 mg by mouth daily.    . vitamin B-12 (CYANOCOBALAMIN) 1000 MCG tablet Take 1,000 mcg by mouth as directed.     . warfarin (COUMADIN) 2.5 MG tablet Take 1 tablet (2.5 mg total) by mouth daily at 6 PM. Or as directed. 90 tablet 1   No facility-administered medications prior to visit.      Per HPI unless specifically indicated in ROS section below Review of Systems     Objective:  BP 140/72 (BP Location: Left Arm, Patient Position: Sitting, Cuff Size: Normal)   Pulse 85   Temp 97.6 F (36.4 C) (Oral)   Wt 232 lb (105.2 kg)   SpO2 97%   BMI 29.79 kg/m   Wt Readings from Last 3 Encounters:  03/20/17 232 lb (105.2 kg)  02/19/17 234 lb 12.8 oz (106.5 kg)  02/05/17 228 lb (103.4 kg)    Physical Exam  Constitutional: He appears well-developed and well-nourished. No distress.  HENT:  Head: Normocephalic and atraumatic.  Right Ear: External ear normal.  Left Ear: External ear normal.  Nose: Nose normal.  Mouth/Throat: Oropharynx is clear and moist. No oropharyngeal exudate.  Eyes: Pupils are equal, round, and reactive to  light. Conjunctivae and EOM are normal. No scleral icterus.  Neck: Normal range of motion. Neck supple.  Cardiovascular: Normal rate, regular rhythm and intact distal pulses.   Murmur (prosthetic valve murmur) heard. Pulmonary/Chest: Effort normal and breath sounds normal. No respiratory distress. He has no wheezes. He has no rales.  Musculoskeletal: He exhibits no edema.  See HPI for foot exam if done  Lymphadenopathy:    He has no cervical adenopathy.  Skin: Skin is warm and dry. No rash noted.  Psychiatric: He has a normal mood and affect.  Nursing note and vitals reviewed.  Results for orders placed or performed in visit on 02/20/17  POCT INR  Result Value Ref Range   INR 2.1    Lab Results  Component Value Date   VITAMINB12 289 09/07/2016       Assessment & Plan:   Problem List Items Addressed This Visit    Atrial fibrillation ([place]) (Chronic)    On coumadin.       CKD stage 3 due to type 2 diabetes mellitus ([place])   Relevant Orders   Renal function panel   Encounter for therapeutic drug monitoring    Will see if mandy can see patient today. If not will add INR to blood work.       Relevant Orders   Protime-INR   Essential hypertension (Chronic)    Chronic, stable. Continue current regimen.       Type 2 diabetes, controlled, with retinopathy ([place]) - Primary (Chronic)    Chronic, stable. Continue current regimen. Update A1c.       Relevant Orders   Hemoglobin A1c   Vitamin B12 deficiency    Update B12 level.       Relevant Orders   Vitamin B12       Follow up plan: Return in about 6 months (around 09/18/2017) for annual exam, prior fasting for blood work, medicare wellness visit.  [name], MD

## 2017-03-20 NOTE — Assessment & Plan Note (Signed)
Chronic, stable. Continue current regimen. 

## 2017-03-20 NOTE — Assessment & Plan Note (Signed)
Update B12 level 

## 2017-04-02 ENCOUNTER — Other Ambulatory Visit: Payer: Self-pay | Admitting: Family Medicine

## 2017-04-03 ENCOUNTER — Ambulatory Visit: Payer: PPO

## 2017-04-23 ENCOUNTER — Other Ambulatory Visit: Payer: Self-pay | Admitting: Family Medicine

## 2017-05-01 ENCOUNTER — Ambulatory Visit: Payer: PPO

## 2017-05-03 ENCOUNTER — Ambulatory Visit (INDEPENDENT_AMBULATORY_CARE_PROVIDER_SITE_OTHER): Payer: PPO | Admitting: General Practice

## 2017-05-03 DIAGNOSIS — Z7901 Long term (current) use of anticoagulants: Secondary | ICD-10-CM

## 2017-05-03 LAB — POCT INR: INR: 1.5

## 2017-05-03 NOTE — Patient Instructions (Addendum)
Pre visit review using our clinic review tool, if applicable. No additional management support is needed unless otherwise documented below in the visit note.  Take 2 tablets today (12/13) and take 1 1/2 tablets tomorrow (12/14) and then continue taking 1 pill (2.5mg ) daily except for 1/2 pill (1.25mg ) on Sundays .      Recheck in 2 weeks.

## 2017-05-17 ENCOUNTER — Ambulatory Visit (INDEPENDENT_AMBULATORY_CARE_PROVIDER_SITE_OTHER): Payer: PPO | Admitting: General Practice

## 2017-05-17 DIAGNOSIS — Z7901 Long term (current) use of anticoagulants: Secondary | ICD-10-CM

## 2017-05-17 LAB — POCT INR: INR: 2.4

## 2017-05-17 NOTE — Patient Instructions (Addendum)
Pre visit review using our clinic review tool, if applicable. No additional management support is needed unless otherwise documented below in the visit note.  Continue taking 1 pill (2.5mg ) daily except for 1/2 pill (1.25mg ) on Sundays . Re-check in 4 weeks.

## 2017-06-14 ENCOUNTER — Ambulatory Visit (INDEPENDENT_AMBULATORY_CARE_PROVIDER_SITE_OTHER): Payer: PPO | Admitting: General Practice

## 2017-06-14 DIAGNOSIS — Z7901 Long term (current) use of anticoagulants: Secondary | ICD-10-CM

## 2017-06-14 LAB — POCT INR: INR: 2.7

## 2017-06-14 NOTE — Patient Instructions (Addendum)
Pre visit review using our clinic review tool, if applicable. No additional management support is needed unless otherwise documented below in the visit note.  Continue taking 1 pill (2.5mg ) daily except for 1/2 pill (1.25mg ) on Sundays . Re-check in 4 weeks.

## 2017-06-20 DIAGNOSIS — L570 Actinic keratosis: Secondary | ICD-10-CM | POA: Diagnosis not present

## 2017-06-20 DIAGNOSIS — D1801 Hemangioma of skin and subcutaneous tissue: Secondary | ICD-10-CM | POA: Diagnosis not present

## 2017-06-20 DIAGNOSIS — L820 Inflamed seborrheic keratosis: Secondary | ICD-10-CM | POA: Diagnosis not present

## 2017-06-20 DIAGNOSIS — L821 Other seborrheic keratosis: Secondary | ICD-10-CM | POA: Diagnosis not present

## 2017-06-20 DIAGNOSIS — D225 Melanocytic nevi of trunk: Secondary | ICD-10-CM | POA: Diagnosis not present

## 2017-07-19 ENCOUNTER — Ambulatory Visit (INDEPENDENT_AMBULATORY_CARE_PROVIDER_SITE_OTHER): Payer: PPO | Admitting: General Practice

## 2017-07-19 DIAGNOSIS — Z7901 Long term (current) use of anticoagulants: Secondary | ICD-10-CM

## 2017-07-19 LAB — POCT INR: INR: 2.3

## 2017-07-19 NOTE — Patient Instructions (Addendum)
Pre visit review using our clinic review tool, if applicable. No additional management support is needed unless otherwise documented below in the visit note.  Continue taking 1 pill (2.5mg ) daily except for 1/2 pill (1.25mg ) on Sundays . Re-check in 4 weeks.

## 2017-07-28 ENCOUNTER — Other Ambulatory Visit: Payer: Self-pay | Admitting: Family Medicine

## 2017-08-20 DIAGNOSIS — Z9842 Cataract extraction status, left eye: Secondary | ICD-10-CM | POA: Diagnosis not present

## 2017-08-20 DIAGNOSIS — Z9841 Cataract extraction status, right eye: Secondary | ICD-10-CM | POA: Diagnosis not present

## 2017-08-20 DIAGNOSIS — E113593 Type 2 diabetes mellitus with proliferative diabetic retinopathy without macular edema, bilateral: Secondary | ICD-10-CM | POA: Diagnosis not present

## 2017-08-20 DIAGNOSIS — H40023 Open angle with borderline findings, high risk, bilateral: Secondary | ICD-10-CM | POA: Diagnosis not present

## 2017-08-20 DIAGNOSIS — H52213 Irregular astigmatism, bilateral: Secondary | ICD-10-CM | POA: Diagnosis not present

## 2017-08-28 IMAGING — CT CT CHEST W/O CM
3 of 4 series · 16 of 30 positions shown, 18 images · non-contrast
Comparison: 03/20/2011

CLINICAL DATA: Aortic valvular stenosis. Chronic kidney disease and
contraindication to administration of I iodinated contrast.

EXAM:
CT CHEST WITHOUT CONTRAST
TECHNIQUE: Multidetector CT imaging of the chest was performed following the
standard protocol without IV contrast.

[Series 3: chest w/o · axial · non-contrast · 0.78mm/px · z∈[-243,+12]mm · 7 of 137 slices shown, 9 images]
[im 18/137  mediastinal]
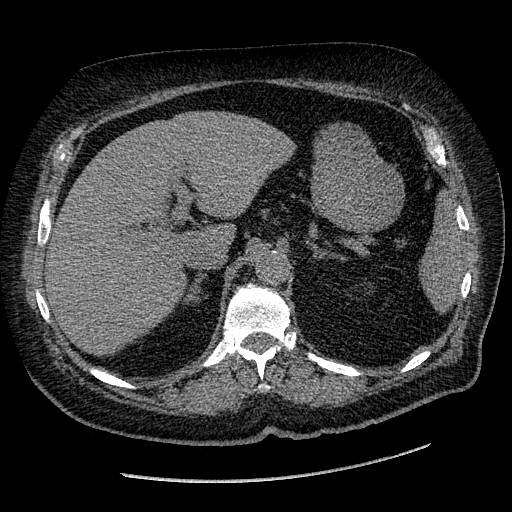
[im 18/137  lung]
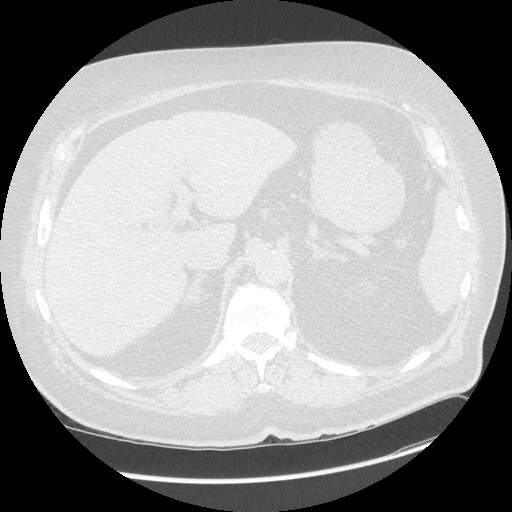
[im 35/137  lung]
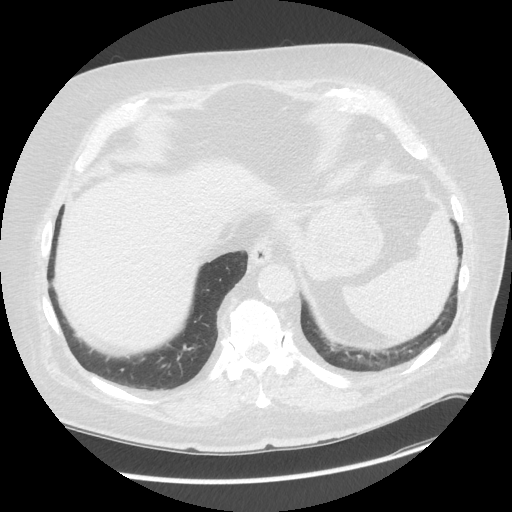
[im 52/137  lung]
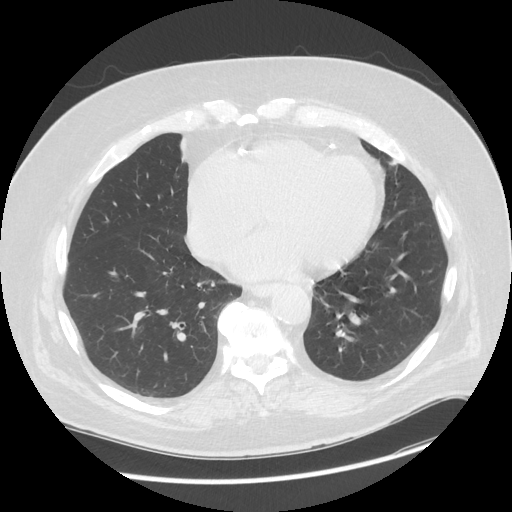
[im 69/137  lung]
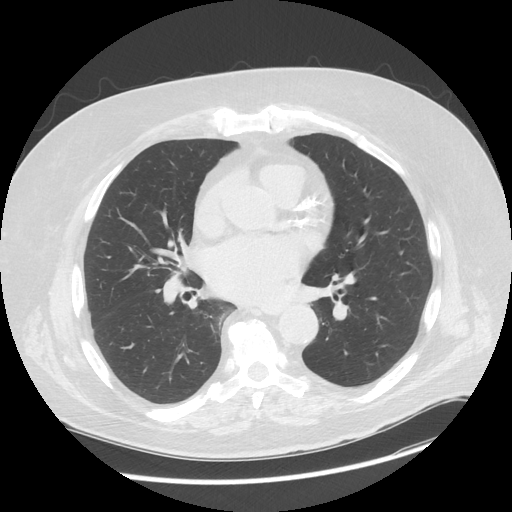
[im 86/137  mediastinal]
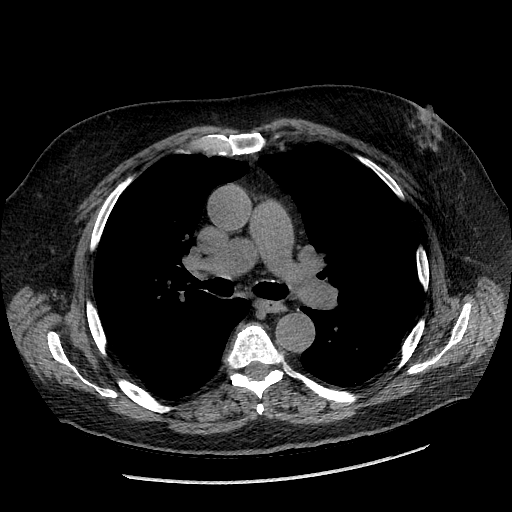
[im 86/137  lung]
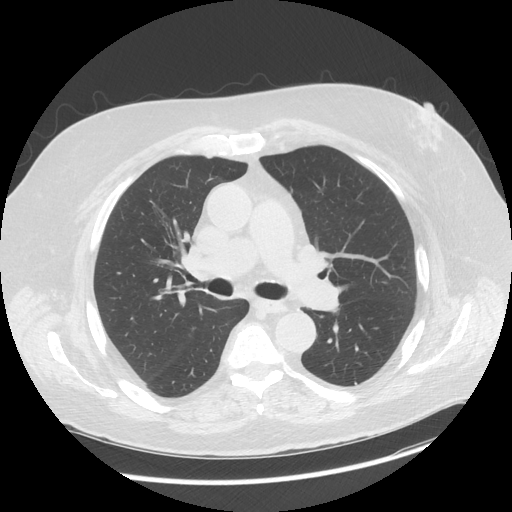
[im 103/137  lung]
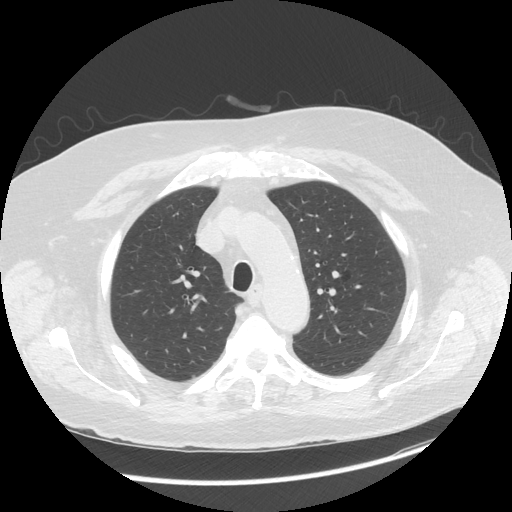
[im 120/137  lung]
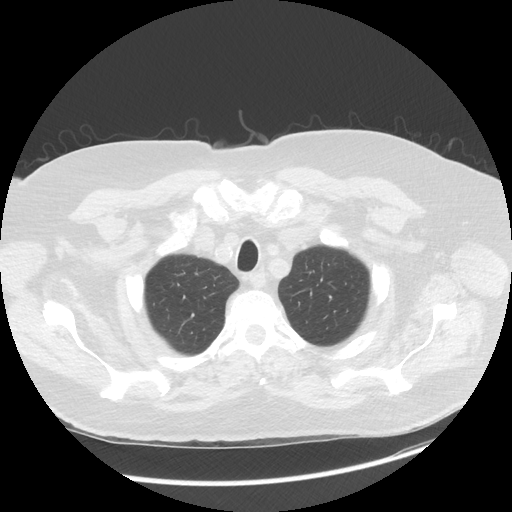

[Series 4: lung windows · axial · 0.78mm/px · z∈[-243,+12]mm · 7 of 137 slices shown]
[im 18/137  lung]
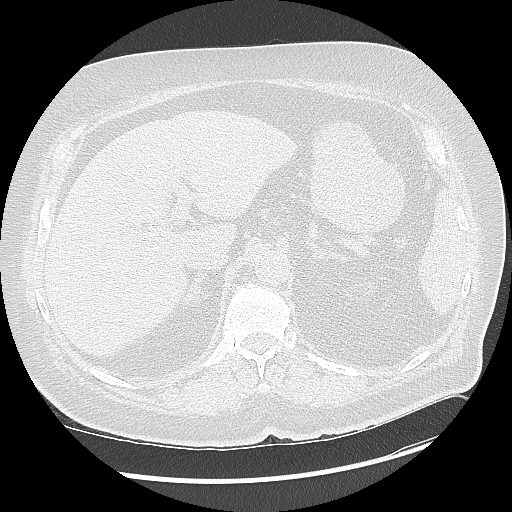
[im 35/137  lung]
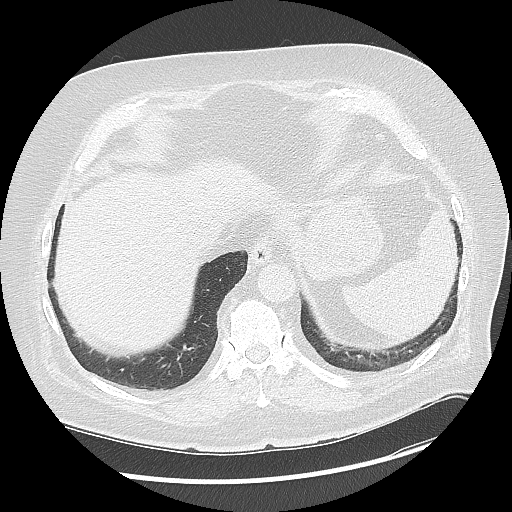
[im 52/137  lung]
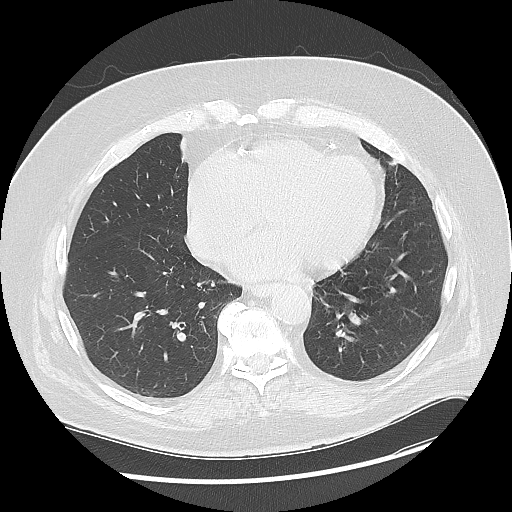
[im 69/137  lung]
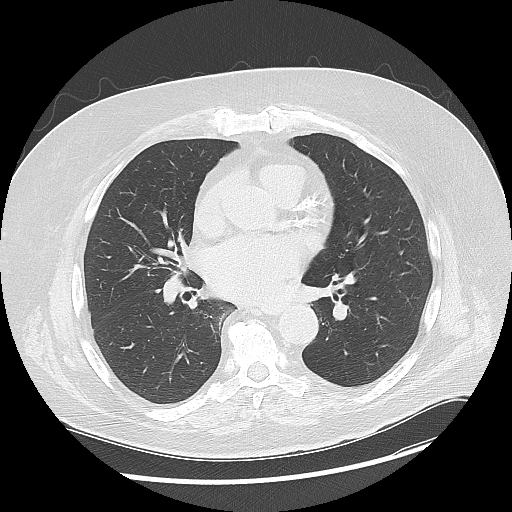
[im 86/137  lung]
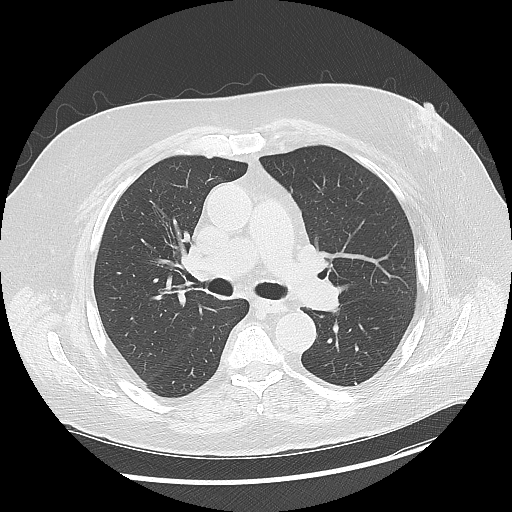
[im 103/137  lung]
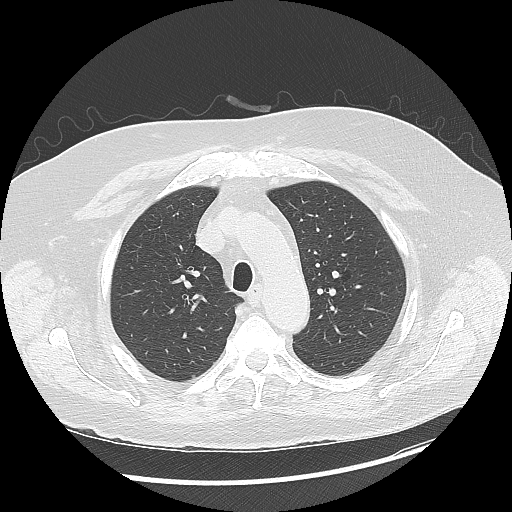
[im 120/137  lung]
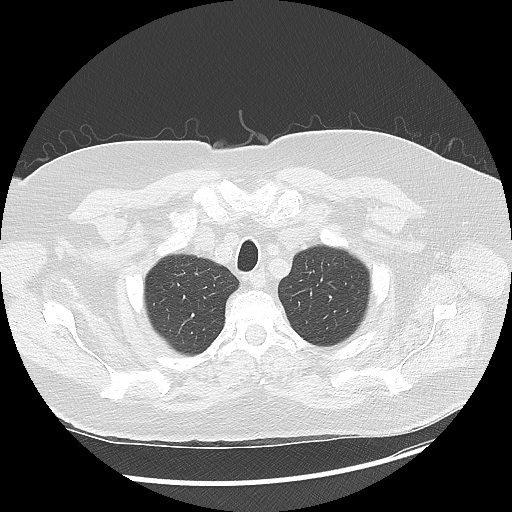

[Series 602: sagittal body · sagittal · 0.78mm/px · 2 of 161 slices shown]
[im 18/161  mediastinal]
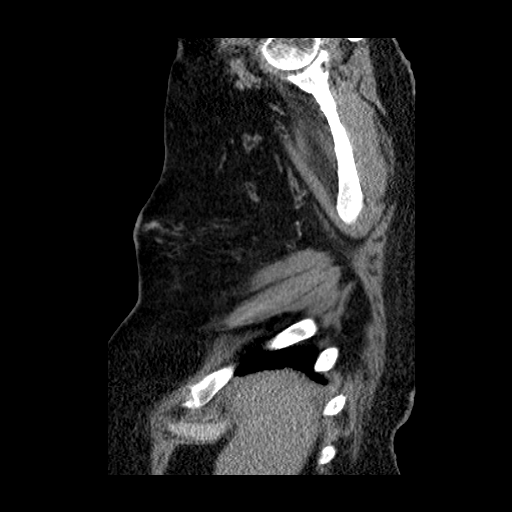
[im 36/161  mediastinal]
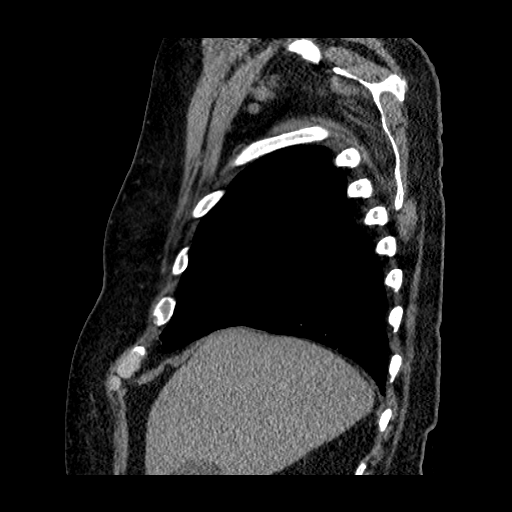

[16 of 30 positions shown; findings below may reference images not displayed]

FINDINGS: Cardiovascular: The aortic valve is heavily calcified. The thoracic
aorta it does not contain significant plaque with a mild amount of
plaque present at the level of the arch. There is no evidence of
thoracic aortic aneurysmal disease. The ascending thoracic aorta
measures 3.5 cm in greatest diameter the proximal arch measures
cm. The distal arch measures 3.3 cm. The descending thoracic aorta
measures 3 cm.

There is evidence of coronary atherosclerosis with heavily calcified
plaque in the distributions of the LAD, left circumflex and right
coronary arteries. The PDA is also calcified at the base of the
heart. The heart size is normal. No pericardial fluid identified.

Mediastinum/Nodes: No evidence of mediastinal masses or
lymphadenopathy.

Lungs/Pleura: Mild pulmonary parenchymal scarring at the left lung
base. There is no evidence of pulmonary edema, consolidation,
pneumothorax, nodule or pleural fluid.

Upper Abdomen: Visualized upper abdominal structures demonstrate
multiple calcified gallstones within the visualize gallbladder. The
pancreas is atrophic and infiltrated by fat.

Musculoskeletal: Bony structures show mild spondylosis throughout
the thoracic spine.
IMPRESSION: 1. Heavily calcified aortic valve. No evidence of thoracic aortic
aneurysmal disease or significant aortic calcification.
2. Coronary atherosclerosis with heavily calcified plaque visualized
by CT in a 3 vessel distribution.
3. No acute findings in the chest or incidental masses.
4. Cholelithiasis with multiple gallstones visualized in the
gallbladder lumen.
5. Atrophy and fatty infiltration of the pancreas.

## 2017-08-30 ENCOUNTER — Ambulatory Visit (INDEPENDENT_AMBULATORY_CARE_PROVIDER_SITE_OTHER): Payer: PPO | Admitting: General Practice

## 2017-08-30 ENCOUNTER — Other Ambulatory Visit: Payer: Self-pay | Admitting: General Practice

## 2017-08-30 DIAGNOSIS — Z7901 Long term (current) use of anticoagulants: Secondary | ICD-10-CM | POA: Diagnosis not present

## 2017-08-30 LAB — POCT INR: INR: 2.6

## 2017-08-30 MED ORDER — WARFARIN SODIUM 2.5 MG PO TABS: 2.5000 mg | ORAL_TABLET | Freq: Every day | ORAL | 1 refills | Status: DC

## 2017-08-30 NOTE — Patient Instructions (Addendum)
Pre visit review using our clinic review tool, if applicable. No additional management support is needed unless otherwise documented below in the visit note.  Continue taking 1 pill (2.5mg ) daily except for 1/2 pill (1.25mg ) on Sundays . Re-check in 4 weeks

## 2017-09-11 ENCOUNTER — Ambulatory Visit (INDEPENDENT_AMBULATORY_CARE_PROVIDER_SITE_OTHER): Payer: PPO

## 2017-09-11 ENCOUNTER — Ambulatory Visit: Payer: PPO

## 2017-09-11 ENCOUNTER — Other Ambulatory Visit: Payer: Self-pay

## 2017-09-11 VITALS — BP 148/90 | HR 78 | Temp 97.6°F | Ht 71.5 in | Wt 232.5 lb

## 2017-09-11 DIAGNOSIS — E7849 Other hyperlipidemia: Secondary | ICD-10-CM

## 2017-09-11 DIAGNOSIS — E559 Vitamin D deficiency, unspecified: Secondary | ICD-10-CM | POA: Diagnosis not present

## 2017-09-11 DIAGNOSIS — Z0000 Encounter for general adult medical examination without abnormal findings: Secondary | ICD-10-CM | POA: Diagnosis not present

## 2017-09-11 DIAGNOSIS — E113553 Type 2 diabetes mellitus with stable proliferative diabetic retinopathy, bilateral: Secondary | ICD-10-CM | POA: Diagnosis not present

## 2017-09-11 DIAGNOSIS — E538 Deficiency of other specified B group vitamins: Secondary | ICD-10-CM | POA: Diagnosis not present

## 2017-09-11 DIAGNOSIS — I10 Essential (primary) hypertension: Secondary | ICD-10-CM | POA: Diagnosis not present

## 2017-09-11 LAB — COMPREHENSIVE METABOLIC PANEL
ALT: 17 U/L (ref 0–53)
AST: 29 U/L (ref 0–37)
Albumin: 4.1 g/dL (ref 3.5–5.2)
Alkaline Phosphatase: 67 U/L (ref 39–117)
BUN: 26 mg/dL — ABNORMAL HIGH (ref 6–23)
CALCIUM: 10.6 mg/dL — AB (ref 8.4–10.5)
CHLORIDE: 103 meq/L (ref 96–112)
CO2: 28 meq/L (ref 19–32)
Creatinine, Ser: 1.07 mg/dL (ref 0.40–1.50)
GFR: 70.88 mL/min (ref >60.00)
Glucose, Bld: 76 mg/dL (ref 70–99)
POTASSIUM: 5.3 meq/L — AB (ref 3.5–5.1)
Sodium: 136 mEq/L (ref 135–145)
Total Bilirubin: 0.7 mg/dL (ref 0.2–1.2)
Total Protein: 7.8 g/dL (ref 6.0–8.3)

## 2017-09-11 LAB — CBC WITH DIFFERENTIAL/PLATELET
BASOS PCT: 0.7 % (ref 0.0–3.0)
Basophils Absolute: 0 10*3/uL (ref 0.0–0.1)
EOS ABS: 0.1 10*3/uL (ref 0.0–0.7)
EOS PCT: 1.6 % (ref 0.0–5.0)
HEMATOCRIT: 39.5 % (ref 39.0–52.0)
Hemoglobin: 13.1 g/dL (ref 13.0–17.0)
LYMPHS PCT: 16 % (ref 12.0–46.0)
Lymphs Abs: 0.9 10*3/uL (ref 0.7–4.0)
MCHC: 33.3 g/dL (ref 30.0–36.0)
MCV: 96.9 fl (ref 78.0–100.0)
MONOS PCT: 11.9 % (ref 3.0–12.0)
Monocytes Absolute: 0.7 10*3/uL (ref 0.1–1.0)
NEUTROS ABS: 4.1 10*3/uL (ref 1.4–7.7)
Neutrophils Relative %: 69.8 % (ref 43.0–77.0)
PLATELETS: 230 10*3/uL (ref 150.0–400.0)
RBC: 4.07 Mil/uL — ABNORMAL LOW (ref 4.22–5.81)
RDW: 14.1 % (ref 11.5–15.5)
WBC: 5.9 10*3/uL (ref 4.0–10.5)

## 2017-09-11 LAB — LIPID PANEL
Cholesterol: 147 mg/dL (ref 0–200)
HDL: 78.5 mg/dL (ref >39.00)
LDL Cholesterol: 60 mg/dL (ref 0–99)
NONHDL: 68.13
TRIGLYCERIDES: 43 mg/dL (ref 0.0–149.0)
Total CHOL/HDL Ratio: 2
VLDL: 8.6 mg/dL (ref 0.0–40.0)

## 2017-09-11 LAB — HEMOGLOBIN A1C: HEMOGLOBIN A1C: 5.9 % (ref 4.6–6.5)

## 2017-09-11 LAB — VITAMIN B12: Vitamin B-12: 393 pg/mL (ref 211–911)

## 2017-09-11 LAB — VITAMIN D 25 HYDROXY (VIT D DEFICIENCY, FRACTURES): VITD: 33.43 ng/mL (ref 30.00–100.00)

## 2017-09-11 NOTE — Patient Instructions (Signed)
Bill [name] , Thank you for taking time to come for your Medicare Wellness Visit. I appreciate your ongoing commitment to your health goals. Please review the following plan we discussed and let me know if I can assist you in the future.   These are the goals we discussed: Goals    . Eat more fruits and vegetables     Starting 09/11/2017, I will attempt to eat at least 2 low glycemic fruits daily.        This is a list of the screening recommended for you and due dates:  Health Maintenance  Topic Date Due  . DTaP/Tdap/Td vaccine (1 - Tdap) 08/22/2021*  . Flu Shot  12/20/2017  . Hemoglobin A1C  03/13/2018  . Complete foot exam   03/20/2018  . Eye exam for diabetics  08/21/2018  . Tetanus Vaccine  08/22/2021  . Pneumonia vaccines  Completed  *Topic was postponed. The date shown is not the original due date.   Preventive Care for Adults  A healthy lifestyle and preventive care can promote health and wellness. Preventive health guidelines for adults include the following key practices.  . A routine yearly physical is a good way to check with your health care provider about your health and preventive screening. It is a chance to share any concerns and updates on your health and to receive a thorough exam.  . Visit your dentist for a routine exam and preventive care every 6 months. Brush your teeth twice a day and floss once a day. Good oral hygiene prevents tooth decay and gum disease.  . The frequency of eye exams is based on your age, health, family medical history, use  of contact lenses, and other factors. Follow your health care provider's recommendations for frequency of eye exams.  . Eat a healthy diet. Foods like vegetables, fruits, whole grains, low-fat dairy products, and lean protein foods contain the nutrients you need without too many calories. Decrease your intake of foods high in solid fats, added sugars, and salt. Eat the right amount of calories for you. Get information  about a proper diet from your health care provider, if necessary.  . Regular physical exercise is one of the most important things you can do for your health. Most adults should get at least 150 minutes of moderate-intensity exercise (any activity that increases your heart rate and causes you to sweat) each week. In addition, most adults need muscle-strengthening exercises on 2 or more days a week.  Silver Sneakers may be a benefit available to you. To determine eligibility, you may visit the website: www.silversneakers.com or contact program at 442-383-8987 Mon-Fri between 8AM-8PM.   . Maintain a healthy weight. The body mass index (BMI) is a screening tool to identify possible weight problems. It provides an estimate of body fat based on height and weight. Your health care provider can find your BMI and can help you achieve or maintain a healthy weight.   For adults 20 years and older: ? A BMI below 18.5 is considered underweight. ? A BMI of 18.5 to 24.9 is normal. ? A BMI of 25 to 29.9 is considered overweight. ? A BMI of 30 and above is considered obese.   . Maintain normal blood lipids and cholesterol levels by exercising and minimizing your intake of saturated fat. Eat a balanced diet with plenty of fruit and vegetables. Blood tests for lipids and cholesterol should begin at age 46 and be repeated every 5 years. If your lipid or  cholesterol levels are high, you are over 50, or you are at high risk for heart disease, you may need your cholesterol levels checked more frequently. Ongoing high lipid and cholesterol levels should be treated with medicines if diet and exercise are not working.  . If you smoke, find out from your health care provider how to quit. If you do not use tobacco, please do not start.  . If you choose to drink alcohol, please do not consume more than 2 drinks per day. One drink is considered to be 12 ounces (355 mL) of beer, 5 ounces (148 mL) of wine, or 1.5 ounces (44  mL) of liquor.  . If you are 24-62 years old, ask your health care provider if you should take aspirin to prevent strokes.  . Use sunscreen. Apply sunscreen liberally and repeatedly throughout the day. You should seek shade when your shadow is shorter than you. Protect yourself by wearing long sleeves, pants, a wide-brimmed hat, and sunglasses year round, whenever you are outdoors.  . Once a month, do a whole body skin exam, using a mirror to look at the skin on your back. Tell your health care provider of new moles, moles that have irregular borders, moles that are larger than a pencil eraser, or moles that have changed in shape or color.

## 2017-09-11 NOTE — Progress Notes (Signed)
Subjective:   Bill [name] is a 77 y.o. male who presents for Medicare Annual/Subsequent preventive examination.  Review of Systems:  N/A Cardiac Risk Factors include: advanced age (>75mn, >>75women);obesity (BMI >30kg/m2);male gender;hypertension;dyslipidemia     Objective:    Vitals: BP (!) 148/90 (BP Location: Left Arm, Patient Position: Sitting, Cuff Size: Normal)   Pulse 78   Temp 97.6 F (36.4 C) (Oral)   Ht 5' 11.5" (1.816 m) Comment: no shoes  Wt 232 lb 8 oz (105.5 kg)   SpO2 99%   BMI 31.98 kg/m   Body mass index is 31.98 kg/m.  Advanced Directives 09/11/2017 09/07/2016 02/10/2016 01/28/2016 01/25/2016 01/05/2016 12/29/2015  Does Patient Have a Medical Advance Directive? _0  Yes Yes  Type of [place]amed[place]of [place] will Living will;Healthcare Power of Attorney Living will Living will Living will  Does patient want to make changes to medical advance directive? No - Patient declined - No - Patient declined No - Patient declined No - Patient declined No - Patient declined No - Patient declined  Copy of [place]in Chart? Yes Yes Yes Yes No - copy requested No - copy requested No - copy requested  Pre-existing out of facility DNR order (yellow form or pink MOST form) - - - - - - -    Tobacco Social History   Tobacco Use  Smoking Status Never Smoker  Smokeless Tobacco Never Used     Counseling given: No   Clinical Intake:  Pre-visit preparation completed: Yes  Pain : No/denies pain Pain Score: 0-No pain     Nutritional Status: BMI > 30  Obese Nutritional Risks: None Diabetes: Yes CBG done?: No Did pt. bring in CBG monitor from home?: No  How often do you need to have someone help you when you read instructions, pamphlets, or other written materials from your doctor or pharmacy?: 1 - Never What is the last grade  level you completed in school?: Bachelors degree  Interpreter Needed?: No  Comments: pt lives with spouse Information entered by :: LPinson, LPN  Past Medical History:  Diagnosis Date  . Aortic stenosis 12/2010   s/p bioprosthetic AVR 9/17 // Echo 6/17: EF 55-60, mean AV 25 mmHg // TEE 6/17: severe AS, AVA 0.93, mean AV 43 mmHg // post op Echo 02/07/16: mod conc LVH, EF 65-70, vigorous LVF, AVR ok, mean 9 mmHg, MAC, trivial MR, mod LAE, trivial pericardial eff  . Chronic diastolic CHF (congestive heart failure) [place]   . Coronary artery disease    LHC 8/17: pLAD 20, mLAD 30, dLAD 20, mRCA 30, dRCA 20, RPDA 20  . Exogenous obesity   . GERD (gastroesophageal reflux disease)    occasional  . History of chicken pox   . Hypertension   . Kidney stones remote  . Lyme disease 2012   ?titers negative  . Neuromuscular disorder (HCC)    tremor right arm  . Persistent atrial fibrillation (HCC)    Tikosyn // s/p Maze procedure 9/17 // Anticoagulation undesirable 2/2 hx of retinal hemorrhage ([facility] at time of AVR 2/2 aortic stenosis)   . PFO (patent foramen ovale)    s/p PFO closure at time of AVR in 9/17  . S/P aortic valve replacement with bioprosthetic valve 01/27/2016   [facility]ard[facility]  . S/P  Maze operation for atrial fibrillation 01/27/2016   Complete bilateral atrial lesion set using bipolar radiofrequency and cryothermy ablation with clipping of LA appendage  . Type 2 diabetes, controlled, with retinopathy ([place]) 1980s  . Urge incontinence   . Vitamin B12 deficiency    IF normal (2014)   Past Surgical History:  Procedure Laterality Date  . AORTIC VALVE REPLACEMENT N/A 01/27/2016   Procedure: AORTIC VALVE REPLACEMENT (AVR);  Surgeon: Rexene Alberts, MD;  Location: [place];  Service: Open Heart Surgery;  Laterality: N/A;  . BACK SURGERY  20 yrs ago   removal of lower back cartilage due to damage. Patient does not  remember date of surgery.  Marland Kitchen CARDIAC CATHETERIZATION N/A 01/05/2016   Procedure: Right/Left Heart Cath and Coronary Angiography;  Surgeon: Burnell Blanks, MD;  Location: [place] [facility] LAB;  Service: Cardiovascular;  Laterality: N/A;  . CARDIOVERSION N/A 02/25/2015   Procedure: CARDIOVERSION;  Surgeon: Skeet Latch, MD;  Location[facility] ENDOSCOPY;  Service: Cardiovascular;  Laterality: N/A;  . CATARACT EXTRACTION Bilateral 2015   [place] [facility] [place]  . COLONOSCOPY    . EYE SURGERY     left eye "cleaned"  . finger sx    . kidney stone removal    . MAZE N/A 01/27/2016   Procedure: MAZE;  Surgeon: Rexene Alberts, MD;  Location: [place];  Service: Open Heart Surgery;  Laterality: N/A;  . TEE WITHOUT CARDIOVERSION N/A 11/05/2015   Procedure: TRANSESOPHAGEAL ECHOCARDIOGRAM (TEE);  Surgeon: Pixie Casino, MD;  Location: [place];  Service: Cardiovascular;  Laterality: N/A;  . TEE WITHOUT CARDIOVERSION N/A 01/27/2016   Procedure: TRANSESOPHAGEAL ECHOCARDIOGRAM (TEE);  Surgeon: Rexene Alberts, MD;  Location: [place];  Service: Open Heart Surgery;  Laterality: N/A;  . TOTAL HIP ARTHROPLASTY  03/12/2012   RIGHT TOTAL HIP ARTHROPLASTY ANTERIOR APPROACH;  Surgeon: Mcarthur Rossetti, MD;  . US ECHOCARDIOGRAPHY  12/2010   EF 50-55%, mild LVH, normal wall motion, high LV filling pressures, mild AS, LA mildly dilated   Family History  Problem Relation Age of Onset  . Diabetes Mother   . Heart disease Mother   . Diabetes Father   . Heart disease Father   . Cancer Brother        throat   Social History   Socioeconomic History  . Marital status: Married    Spouse name: Not on file  . Number of children: Not on file  . Years of education: Not on file  . Highest education level: Not on file  Occupational History  . Not on file  Social Needs  . Financial resource strain: Not on file  . Food insecurity:    Worry: Not on file    Inability: Not on file  . Transportation needs:     Medical: Not on file    Non-medical: Not on file  Tobacco Use  . Smoking status: Never Smoker  . Smokeless tobacco: Never Used  Substance and Sexual Activity  . Alcohol use: Yes    Comment: Regular (bourbon and coke 2-3/day)  . Drug use: No  . Sexual activity: Never  Lifestyle  . Physical activity:    Days per week: Not on file    Minutes per session: Not on file  . Stress: Not on file  Relationships  . Social connections:    Talks on phone: Not on file    Gets together: Not on file    Attends religious service: Not on file  Active member of club or organization: Not on file    Attends meetings of clubs or organizations: Not on file    Relationship status: Not on file  Other Topics Concern  . Not on file  Social History Narrative   Caffeine: coffee 1 cup/day   Lives with wife [name]), 1 cat   Occupation: retired, worked for [employer]   Edu: BS   Activity: no regular exercise   Diet: some water, fruits/vegetables daily      Advanced directives: has living will at home. Does not want prolonged life support "no extraordinary measures" but would accept temporary measures. Would want wife to be HCPOA.     Outpatient Encounter Medications as of 09/11/2017  Medication Sig  . acetaminophen (TYLENOL) 325 MG tablet Take 650 mg by mouth every 4 (four) hours as needed for mild pain or fever.  . cholecalciferol (VITAMIN D) 1000 UNITS tablet Take 1,000 Units by mouth as directed.   Marland Kitchen glipiZIDE (GLUCOTROL) 5 MG tablet Take 1 tablet (5 mg total) by mouth daily before breakfast.  . lisinopril (PRINIVIL,ZESTRIL) 2.5 MG tablet TAKE 1 TABLET (2.5 MG TOTAL) BY MOUTH DAILY.  . metFORMIN (GLUCOPHAGE) 500 MG tablet TAKE 1 TABLET (500 MG TOTAL) BY MOUTH 2 (TWO) TIMES DAILY WITH A MEAL.  . Multiple Vitamin (MULTIVITAMIN WITH MINERALS) TABS tablet Take by mouth daily. 1 packet of 5 vitamins daily  . multivitamin-lutein (OCUVITE-LUTEIN) CAPS capsule Take 3 capsules by mouth as directed.   . ONE TOUCH  ULTRA TEST test strip use to check sugar once daily.  Marland Kitchen oxybutynin (DITROPAN) 5 MG tablet Take 1 tablet (5 mg total) by mouth 3 (three) times daily.  . pravastatin (PRAVACHOL) 20 MG tablet Take 20 mg by mouth daily.  . vitamin B-12 (CYANOCOBALAMIN) 1000 MCG tablet Take 1,000 mcg by mouth as directed.   . warfarin (COUMADIN) 2.5 MG tablet Take 1 tablet (2.5 mg total) by mouth daily at 6 PM. Or as directed.  . [DISCONTINUED] Magnesium Oxide 250 MG TABS Take 1 tablet by mouth daily as needed (supplement).    No facility-administered encounter medications on file as of 09/11/2017.     Activities of Daily Living In your present state of health, do you have any difficulty performing the following activities: 09/11/2017  Hearing? N  Vision? N  Difficulty concentrating or making decisions? N  Walking or climbing stairs? N  Dressing or bathing? N  Doing errands, shopping? N  Preparing Food and eating ? N  Using the Toilet? N  In the past six months, have you accidently leaked urine? N  Do you have problems with loss of bowel control? N  Managing your Medications? N  Managing your Finances? N  Housekeeping or managing your Housekeeping? N  Some recent data might be hidden    Patient Care Team: [name], MD as PCP - General (Family Medicine) Thelma Comp, [place] as Consulting Physician (Optometry) Evans Lance, MD as Consulting Physician (Cardiology) Sherlynn Stalls, MD as Consulting Physician (Ophthalmology) Malcolm Metro as Consulting Physician (Dentistry)   Assessment:   This is a routine wellness examination for [place].   Hearing Screening   _0  _1  _2  _3  _4  _5  _6  _7  _8   Right ear:   40 40 40  40    Left ear:   40 40 40  40    Vision Screening Comments: Last vision exam in April 2019 with Dr. Maryruth Hancock B.    Exercise Activities and Dietary recommendations Current Exercise Habits: Home  exercise routine, Type of exercise: walking, Time  (Minutes): 15, Frequency (Times/Week): 7, Weekly Exercise (Minutes/Week): 105, Intensity: Mild, Exercise limited by: None identified  Goals    . Eat more fruits and vegetables     Starting 09/11/2017, I will attempt to eat at least 2 low glycemic fruits daily.        Fall Risk Fall Risk  09/11/2017 09/07/2016 08/08/2016 02/21/2016 09/14/2015  Falls in the past year? _0   Number falls in past yr: - - - - -  Injury with Fall? - - - - -  Risk for fall due to : - - Impaired balance/gait (No Data) -  Risk for fall due to: Comment - - - None -   Depression Screen PHQ 2/9 Scores 09/11/2017 11/10/2016 09/07/2016 08/14/2016  PHQ - 2 Score 0 0 0 0  PHQ- 9 Score 0 - - -    Cognitive Function MMSE - Mini Mental State Exam 09/11/2017 09/07/2016 09/08/2015  Orientation to time _1 Orientation to Place _2 Registration _3 Attention/ Calculation 0 0 0  Recall _4 Language- name 2 objects 0 0 0  Language- repeat _5 Language- follow 3 step command _6 Language- read & follow direction 0 0 0  Write a sentence 0 0 0  Copy design 0 0 0  Total score _7 PLEASE NOTE: A Mini-Cog screen was completed. Maximum score is 20. A value of 0 denotes this part of Folstein MMSE was not completed or the patient failed this part of the Mini-Cog screening.   Mini-Cog Screening Orientation to Time - Max 5 pts Orientation to Place - Max 5 pts Registration - Max 3 pts Recall - Max 3 pts Language Repeat - Max 1 pts Language Follow 3 Step Command - Max 3 pts       Immunization History  Administered Date(s) Administered  . H1N1 06/03/2008  . Influenza Split 03/16/2011, 03/01/2012  . Influenza Whole 04/23/2007, 06/09/2009, 02/02/2010  . Influenza,inj,Quad PF,6+ Mos 02/26/2013, 02/18/2014, 02/19/2015, 02/02/2016, 02/20/2017  . Pneumococcal Conjugate-13 09/03/2013  . Pneumococcal Polysaccharide-23 04/17/2006  . Td 08/23/2011  . Zoster 10/08/2013    Screening Tests Health  Maintenance  Topic Date Due  . DTaP/Tdap/Td (1 - Tdap) 08/22/2021 (Originally 08/24/2011)  . INFLUENZA VACCINE  12/20/2017  . HEMOGLOBIN A1C  03/13/2018  . FOOT EXAM  03/20/2018  . OPHTHALMOLOGY EXAM  08/21/2018  . TETANUS/TDAP  08/22/2021  . PNA vac Low Risk Adult  Completed       Plan:     I have personally reviewed, addressed, and noted the following in the patient's chart:  A. Medical and social history B. Use of alcohol, tobacco or illicit drugs  C. Current medications and supplements D. Functional ability and status E.  Nutritional status F.  Physical activity G. Advance directives H. List of other physicians I.  Hospitalizations, surgeries, and ER visits in previous 12 months J.  [place] to include hearing, vision, cognitive, depression L. Referrals and appointments - none  In addition, I have reviewed and discussed with patient certain preventive protocols, quality metrics, and best practice recommendations. A written personalized care plan for preventive services as well as general preventive health recommendations were provided to patient.  See attached scanned questionnaire for additional information.   Signed,   Lindell Noe, MHA, BS, LPN Health Coach

## 2017-09-11 NOTE — Progress Notes (Signed)
PCP notes:   Health maintenance:  A1C - completed Eye exam - per pt, exam in April 2019   Abnormal screenings:   None  Patient concerns:   Medication refill - request sent to PCP  Nurse concerns:  None  Next PCP appt:   09/17/17 @ 0830

## 2017-09-11 NOTE — Telephone Encounter (Signed)
Patient in office today for AWV. Requested 90 day refill of Lisinopril to be sent to [id number].   Please note insurance has flagged medication as non-formulary and not reimbursable.

## 2017-09-13 MED ORDER — LISINOPRIL 2.5 MG PO TABS: 2.5000 mg | ORAL_TABLET | Freq: Every day | ORAL | 1 refills | Status: DC

## 2017-09-13 NOTE — Telephone Encounter (Signed)
Contacted patient regarding refill of Lisinopril. Advised patient that insurance has determined medication is non-formulary and may not provide prescription benefits. Advised patient that [facility] offers prescription for $24 for a 90 day supply. Patient will be determine if cost at [facility] is greater than $24 and then make a decision whether or not to transfer medication to [facility].

## 2017-09-17 ENCOUNTER — Ambulatory Visit (INDEPENDENT_AMBULATORY_CARE_PROVIDER_SITE_OTHER): Payer: PPO | Admitting: Family Medicine

## 2017-09-17 ENCOUNTER — Encounter: Payer: Self-pay | Admitting: Family Medicine

## 2017-09-17 VITALS — BP 136/68 | HR 83 | Temp 97.8°F | Ht 72.0 in | Wt 230.0 lb

## 2017-09-17 DIAGNOSIS — E559 Vitamin D deficiency, unspecified: Secondary | ICD-10-CM | POA: Diagnosis not present

## 2017-09-17 DIAGNOSIS — E875 Hyperkalemia: Secondary | ICD-10-CM | POA: Diagnosis not present

## 2017-09-17 DIAGNOSIS — Z7189 Other specified counseling: Secondary | ICD-10-CM | POA: Diagnosis not present

## 2017-09-17 DIAGNOSIS — I10 Essential (primary) hypertension: Secondary | ICD-10-CM

## 2017-09-17 DIAGNOSIS — E538 Deficiency of other specified B group vitamins: Secondary | ICD-10-CM

## 2017-09-17 DIAGNOSIS — I482 Chronic atrial fibrillation: Secondary | ICD-10-CM | POA: Diagnosis not present

## 2017-09-17 DIAGNOSIS — R259 Unspecified abnormal involuntary movements: Secondary | ICD-10-CM | POA: Diagnosis not present

## 2017-09-17 DIAGNOSIS — E669 Obesity, unspecified: Secondary | ICD-10-CM

## 2017-09-17 DIAGNOSIS — E1151 Type 2 diabetes mellitus with diabetic peripheral angiopathy without gangrene: Secondary | ICD-10-CM

## 2017-09-17 DIAGNOSIS — G252 Other specified forms of tremor: Secondary | ICD-10-CM

## 2017-09-17 DIAGNOSIS — Z953 Presence of xenogenic heart valve: Secondary | ICD-10-CM

## 2017-09-17 DIAGNOSIS — E1122 Type 2 diabetes mellitus with diabetic chronic kidney disease: Secondary | ICD-10-CM | POA: Diagnosis not present

## 2017-09-17 DIAGNOSIS — N3941 Urge incontinence: Secondary | ICD-10-CM | POA: Diagnosis not present

## 2017-09-17 DIAGNOSIS — Z0000 Encounter for general adult medical examination without abnormal findings: Secondary | ICD-10-CM | POA: Diagnosis not present

## 2017-09-17 DIAGNOSIS — E11319 Type 2 diabetes mellitus with unspecified diabetic retinopathy without macular edema: Secondary | ICD-10-CM

## 2017-09-17 DIAGNOSIS — E785 Hyperlipidemia, unspecified: Secondary | ICD-10-CM | POA: Diagnosis not present

## 2017-09-17 DIAGNOSIS — N183 Chronic kidney disease, stage 3 (moderate): Secondary | ICD-10-CM

## 2017-09-17 DIAGNOSIS — E1142 Type 2 diabetes mellitus with diabetic polyneuropathy: Secondary | ICD-10-CM

## 2017-09-17 DIAGNOSIS — I4821 Permanent atrial fibrillation: Secondary | ICD-10-CM

## 2017-09-17 NOTE — Assessment & Plan Note (Signed)
Chronic, stable. Continue pravastatin daily.

## 2017-09-17 NOTE — Progress Notes (Signed)
BP 136/68 (BP Location: Left Arm, Patient Position: Sitting, Cuff Size: Normal)   Pulse 83   Temp 97.8 F (36.6 C) (Oral)   Ht 6' (1.829 m)   Wt 230 lb (104.3 kg)   SpO2 99%   BMI 31.19 kg/m    CC: CPE Subjective:    Patient ID: Bill [name], male    DOB: 02/14/1941, 77 y.o.   MRN: [id number]  HPI: Bill [name] is a 77 y.o. male presenting on 09/17/2017 for Annual Exam   Saw Katha Cabal last week for medicare wellness visit. Note reviewed. 1 fall at home 3 months ago - had trouble getting up, vomited x1 then felt better. No syncope, dizziness. No injury.  Regularly sees Dr Lovena Le and Dr [name] s/p bioprosthetic AVR, diastolic CHF, persistent afib despite MAZE procedure and LAA ligation.   Easy skin tears.  Stools normal.  Tremor progressively worsening - generally R arm. Some unsteady and imbalance with walking. Memory doing well. Urinary incontinence on oxybutynin, leaks despite this, wears pads. Declines trial of myrbetriq.   Preventative: Colonoscopy 2002, told normal. Cologuard 09/2015 normal.  Prostate screening - always normal. Denies nocturia, hesitancy or changes in stream. Aged out.  Flu shot yearly  Pneumonia shot 2007. Prevnar 08/2013 Td 2013  zostavax - 09/2013.  Shingrix - discussed.  Advanced directives: Scanned into chart 12/2015 - HCPOA are Bill [name] then Bill [name]. Does not want life prolonging measures if prolonged unconscious. Seat belt use discussed Sunscreen use discussed. No changing moles on skin. Sees derm.  Sees eye doctor yearly Dentist - Q6 months Non smoker  Alcohol - 2-3 mixed drinks a night   Caffeine: coffee 1 cup/day Lives with wife Bill [name]), 1 cat Occupation: retired, worked for [employer]: BS Activity: no regular exercise, gardening Diet: some water, fruits/vegetables daily  Relevant past medical, surgical, family and social history reviewed and updated as indicated. Interim medical history since our last visit reviewed. Allergies  and medications reviewed and updated. Outpatient Medications Prior to Visit  Medication Sig Dispense Refill  . acetaminophen (TYLENOL) 325 MG tablet Take 650 mg by mouth every 4 (four) hours as needed for mild pain or fever.    . cholecalciferol (VITAMIN D) 1000 UNITS tablet Take 1,000 Units by mouth as directed.     Marland Kitchen glipiZIDE (GLUCOTROL) 5 MG tablet Take 0.5 tablets (2.5 mg total) by mouth daily before breakfast.    . lisinopril (PRINIVIL,ZESTRIL) 2.5 MG tablet Take 1 tablet (2.5 mg total) by mouth daily. 90 tablet 1  . metFORMIN (GLUCOPHAGE) 500 MG tablet TAKE 1 TABLET (500 MG TOTAL) BY MOUTH 2 (TWO) TIMES DAILY WITH A MEAL. 180 tablet 1  . Multiple Vitamin (MULTIVITAMIN WITH MINERALS) TABS tablet Take by mouth daily. 1 packet of 5 vitamins daily    . multivitamin-lutein (OCUVITE-LUTEIN) CAPS capsule Take 3 capsules by mouth as directed.     . ONE TOUCH ULTRA TEST test strip use to check sugar once daily. 100 each 1  . oxybutynin (DITROPAN) 5 MG tablet Take 1 tablet (5 mg total) by mouth 3 (three) times daily. 270 tablet 3  . pravastatin (PRAVACHOL) 20 MG tablet Take 20 mg by mouth daily.    . vitamin B-12 (CYANOCOBALAMIN) 1000 MCG tablet Take 1,000 mcg by mouth as directed.     . warfarin (COUMADIN) 2.5 MG tablet Take 1 tablet (2.5 mg total) by mouth daily at 6 PM. Or as directed. 90 tablet 1  . glipiZIDE (GLUCOTROL) 5 MG tablet  Take 1 tablet (5 mg total) by mouth daily before breakfast. 90 tablet 3  . Halcinonide (HALOG) 0.1 % OINT Apply topically.  0   No facility-administered medications prior to visit.      Per HPI unless specifically indicated in ROS section below Review of Systems  Constitutional: Negative for activity change, appetite change, chills, fatigue, fever and unexpected weight change.  HENT: Negative for hearing loss.   Eyes: Negative for visual disturbance.  Respiratory: Negative for cough, chest tightness, shortness of breath and wheezing.   Cardiovascular: Negative  for chest pain, palpitations and leg swelling.  Gastrointestinal: Negative for abdominal distention, abdominal pain, blood in stool, constipation, diarrhea, nausea and vomiting.  Genitourinary: Negative for difficulty urinating and hematuria.  Musculoskeletal: Negative for arthralgias, myalgias and neck pain.  Skin: Negative for rash.  Neurological: Positive for weakness (muscle weakness noted). Negative for dizziness, seizures, syncope and headaches.       Increasing imbalance No paresthesias  Hematological: Negative for adenopathy. Bruises/bleeds easily.  Psychiatric/Behavioral: Negative for dysphoric mood. The patient is not nervous/anxious.        Objective:    BP 136/68 (BP Location: Left Arm, Patient Position: Sitting, Cuff Size: Normal)   Pulse 83   Temp 97.8 F (36.6 C) (Oral)   Ht 6' (1.829 m)   Wt 230 lb (104.3 kg)   SpO2 99%   BMI 31.19 kg/m   Wt Readings from Last 3 Encounters:  09/17/17 230 lb (104.3 kg)  09/11/17 232 lb 8 oz (105.5 kg)  03/20/17 232 lb (105.2 kg)    Physical Exam  Constitutional: He appears well-developed and well-nourished. No distress.  HENT:  Head: Normocephalic and atraumatic.  Right Ear: Hearing, tympanic membrane, external ear and ear canal normal.  Left Ear: Hearing, tympanic membrane, external ear and ear canal normal.  Nose: Nose normal.  Mouth/Throat: Uvula is midline, oropharynx is clear and moist and mucous membranes are normal. No oropharyngeal exudate, posterior oropharyngeal edema or posterior oropharyngeal erythema.  Eyes: Pupils are equal, round, and reactive to light. Conjunctivae and EOM are normal. No scleral icterus.  Neck: Normal range of motion. Neck supple. Carotid bruit is not present. No thyromegaly present.  Cardiovascular: Normal rate and intact distal pulses. An irregular rhythm present.  Murmur (3/6 systolic murmur) heard. Pulses:      Radial pulses are 2+ on the right side, and 2+ on the left side.    Pulmonary/Chest: Effort normal and breath sounds normal. No respiratory distress. He has no wheezes. He has no rales.  Abdominal: Soft. Bowel sounds are normal. He exhibits no distension and no mass. There is no tenderness. There is no rebound and no guarding.  Musculoskeletal: Normal range of motion. He exhibits no edema.  Lymphadenopathy:    He has no cervical adenopathy.  Neurological: He is alert.  CN grossly intact, station and gait intact No shuffling gait ?mild masked fascies Evident resting tremor of R hand  Mild cogwheel rigidity BUE  Skin: Skin is warm and dry. No rash noted.  Psychiatric: He has a normal mood and affect. His behavior is normal. Judgment and thought content normal.  Nursing note and vitals reviewed.  Results for orders placed or performed in visit on 09/11/17  Hemoglobin A1c  Result Value Ref Range   Hgb A1c MFr Bld 5.9 4.6 - 6.5 %  Lipid Panel  Result Value Ref Range   Cholesterol 147 0 - 200 mg/dL   Triglycerides 43.0 0.0 - 149.0 mg/dL  HDL 78.50 >39.00 mg/dL   VLDL 8.6 0.0 - 40.0 mg/dL   LDL Cholesterol 60 0 - 99 mg/dL   Total CHOL/HDL Ratio 2    NonHDL 68.13   Vitamin D, 25-hydroxy  Result Value Ref Range   VITD 33.43 30.00 - 100.00 ng/mL  Vitamin B12  Result Value Ref Range   Vitamin B-12 393 211 - 911 pg/mL  CBC with Differential/Platelet  Result Value Ref Range   WBC 5.9 4.0 - 10.5 K/uL   RBC 4.07 (L) 4.22 - 5.81 Mil/uL   Hemoglobin 13.1 13.0 - 17.0 g/dL   HCT 39.5 39.0 - 52.0 %   MCV 96.9 78.0 - 100.0 fl   MCHC 33.3 30.0 - 36.0 g/dL   RDW 14.1 11.5 - 15.5 %   Platelets 230.0 150.0 - 400.0 K/uL   Neutrophils Relative % 69.8 43.0 - 77.0 %   Lymphocytes Relative 16.0 12.0 - 46.0 %   Monocytes Relative 11.9 3.0 - 12.0 %   Eosinophils Relative 1.6 0.0 - 5.0 %   Basophils Relative 0.7 0.0 - 3.0 %   Neutro Abs 4.1 1.4 - 7.7 K/uL   Lymphs Abs 0.9 0.7 - 4.0 K/uL   Monocytes Absolute 0.7 0.1 - 1.0 K/uL   Eosinophils Absolute 0.1 0.0 -  0.7 K/uL   Basophils Absolute 0.0 0.0 - 0.1 K/uL  Comprehensive metabolic panel  Result Value Ref Range   Sodium 136 135 - 145 mEq/L   Potassium 5.3 (H) 3.5 - 5.1 mEq/L   Chloride 103 96 - 112 mEq/L   CO2 28 19 - 32 mEq/L   Glucose, Bld 76 70 - 99 mg/dL   BUN 26 (H) 6 - 23 mg/dL   Creatinine, Ser 1.07 0.40 - 1.50 mg/dL   Total Bilirubin 0.7 0.2 - 1.2 mg/dL   Alkaline Phosphatase 67 39 - 117 U/L   AST 29 0 - 37 U/L   ALT 17 0 - 53 U/L   Total Protein 7.8 6.0 - 8.3 g/dL   Albumin 4.1 3.5 - 5.2 g/dL   Calcium 10.6 (H) 8.4 - 10.5 mg/dL   GFR 70.88 >60.00 mL/min      Assessment & Plan:   Problem List Items Addressed This Visit    Advanced care planning/counseling discussion    Advanced directives: Scanned into chart 12/2015 - HCPOA are Willeen Cass then Bill [name]. Does not want life prolonging measures if prolonged unconscious.      Atrial fibrillation (HCC) (Chronic)    Continue coumadin.      CKD stage 3 due to type 2 diabetes mellitus ([place])    Improved - will continue to monitor.       Relevant Medications   glipiZIDE (GLUCOTROL) 5 MG tablet   Diabetic peripheral neuropathy associated with type 2 diabetes mellitus (HCC)   Relevant Medications   glipiZIDE (GLUCOTROL) 5 MG tablet   Diabetic peripheral vascular disease (HCC)   Relevant Medications   glipiZIDE (GLUCOTROL) 5 MG tablet   Essential hypertension (Chronic)    Chronic, stable on low dose lisinopril - however with hyperkalemia and persistent dry cough endorsed will trial off ACEI. Update with effect.       Health maintenance examination - Primary    Preventative protocols reviewed and updated unless pt declined. Discussed healthy diet and lifestyle.       HLD (hyperlipidemia) (Chronic)    Chronic, stable. Continue pravastatin daily.       Hyperkalemia    Trial off ACEI.  Obesity, Class I, BMI 30-34.9   Resting tremor    Predominantly R hand. No memory troubles. Some unsteadiness noted. No fmhx  essential tremor or parkinson's disease. Offered neurology referral to help r/o parkinsonism, declines at this time. Chronic urge incontinence treated with oxybutynin - caution with anticholinergic effect.       S/P aortic valve replacement with bioprosthetic valve + maze procedure   Type 2 diabetes, controlled, with retinopathy (HCC) (Chronic)    Chronic, stable. Continue current regimen. Concern for too tight control - will decrease glipizide to 1/2 tab daily with breakfast.       Relevant Medications   glipiZIDE (GLUCOTROL) 5 MG tablet   Urge incontinence    Stable on oxybutynin TID      Vitamin B12 deficiency    Encouraged he take b12 1032mcg daily.      Vitamin D deficiency (Chronic)    Not regular with replacement. Encouraged continue 1000 IU daily.           No orders of the defined types were placed in this encounter.  No orders of the defined types were placed in this encounter.   Follow up plan: Return in about 6 months (around 03/19/2018) for follow up visit.  [name], MD

## 2017-09-17 NOTE — Assessment & Plan Note (Signed)
Advanced directives: Scanned into chart 12/2015 - HCPOA are Leticia Mcdiarmid then Earley Abide. Does not want life prolonging measures if prolonged unconscious.

## 2017-09-17 NOTE — Assessment & Plan Note (Addendum)
Chronic, stable on low dose lisinopril - however with hyperkalemia and persistent dry cough endorsed will trial off ACEI. Update with effect.

## 2017-09-17 NOTE — Assessment & Plan Note (Signed)
Stable on oxybutynin TID

## 2017-09-17 NOTE — Assessment & Plan Note (Signed)
Encouraged he take b12 1018mcg daily.

## 2017-09-17 NOTE — Assessment & Plan Note (Signed)
Improved will continue to monitor 

## 2017-09-17 NOTE — Assessment & Plan Note (Signed)
Trial off ACEI.

## 2017-09-17 NOTE — Assessment & Plan Note (Signed)
Not regular with replacement. Encouraged continue 1000 IU daily.

## 2017-09-17 NOTE — Assessment & Plan Note (Signed)
Predominantly R hand. No memory troubles. Some unsteadiness noted. No fmhx essential tremor or parkinson's disease. Offered neurology referral to help r/o parkinsonism, declines at this time. Chronic urge incontinence treated with oxybutynin - caution with anticholinergic effect.

## 2017-09-17 NOTE — Patient Instructions (Addendum)
If interested, check with pharmacy about new 2 shot shingles series (shingrix).  Trial off lisinopril to see effect on cough. Cut glipizide dose in half - to take 2.5mg  once daily with breakfast. Return in 6 months for follow up visit.   Health Maintenance, Male A healthy lifestyle and preventive care is important for your health and wellness. Ask your health care provider about what schedule of regular examinations is right for you. What should I know about weight and diet? Eat a Healthy Diet  Eat plenty of vegetables, fruits, whole grains, low-fat dairy products, and lean protein.  Do not eat a lot of foods high in solid fats, added sugars, or salt.  Maintain a Healthy Weight Regular exercise can help you achieve or maintain a healthy weight. You should:  Do at least 150 minutes of exercise each week. The exercise should increase your heart rate and make you sweat (moderate-intensity exercise).  Do strength-training exercises at least twice a week.  Watch Your Levels of Cholesterol and Blood Lipids  Have your blood tested for lipids and cholesterol every 5 years starting at 79 years of age. If you are at high risk for heart disease, you should start having your blood tested when you are 79 years old. You may need to have your cholesterol levels checked more often if: ? Your lipid or cholesterol levels are high. ? You are older than 79 years of age. ? You are at high risk for heart disease.  What should I know about cancer screening? Many types of cancers can be detected early and may often be prevented. Lung Cancer  You should be screened every year for lung cancer if: ? You are a current smoker who has smoked for at least 30 years. ? You are a former smoker who has quit within the past 15 years.  Talk to your health care provider about your screening options, when you should start screening, and how often you should be screened.  Colorectal Cancer  Routine colorectal cancer  screening usually begins at 79 years of age and should be repeated every 5-10 years until you are 79 years old. You may need to be screened more often if early forms of precancerous polyps or small growths are found. Your health care provider may recommend screening at an earlier age if you have risk factors for colon cancer.  Your health care provider may recommend using home test kits to check for hidden blood in the stool.  A small camera at the end of a tube can be used to examine your colon (sigmoidoscopy or colonoscopy). This checks for the earliest forms of colorectal cancer.  Prostate and Testicular Cancer  Depending on your age and overall health, your health care provider may do certain tests to screen for prostate and testicular cancer.  Talk to your health care provider about any symptoms or concerns you have about testicular or prostate cancer.  Skin Cancer  Check your skin from head to toe regularly.  Tell your health care provider about any new moles or changes in moles, especially if: ? There is a change in a mole's size, shape, or color. ? You have a mole that is larger than a pencil eraser.  Always use sunscreen. Apply sunscreen liberally and repeat throughout the day.  Protect yourself by wearing long sleeves, pants, a wide-brimmed hat, and sunglasses when outside.  What should I know about heart disease, diabetes, and high blood pressure?  If you are 18-39 years of  age, have your blood pressure checked every 3-5 years. If you are 40 years of age or older, have your blood pressure checked every year. You should have your blood pressure measured twice-once when you are at a hospital or clinic, and once when you are not at a hospital or clinic. Record the average of the two measurements. To check your blood pressure when you are not at a hospital or clinic, you can use: ? An automated blood pressure machine at a pharmacy. ? A home blood pressure monitor.  Talk to your  health care provider about your target blood pressure.  If you are between 45-79 years old, ask your health care provider if you should take aspirin to prevent heart disease.  Have regular diabetes screenings by checking your fasting blood sugar level. ? If you are at a normal weight and have a low risk for diabetes, have this test once every three years after the age of 45. ? If you are overweight and have a high risk for diabetes, consider being tested at a younger age or more often.  A one-time screening for abdominal aortic aneurysm (AAA) by ultrasound is recommended for men aged 65-75 years who are current or former smokers. What should I know about preventing infection? Hepatitis B If you have a higher risk for hepatitis B, you should be screened for this virus. Talk with your health care provider to find out if you are at risk for hepatitis B infection. Hepatitis C Blood testing is recommended for:  Everyone born from 1945 through 1965.  Anyone with known risk factors for hepatitis C.  Sexually Transmitted Diseases (STDs)  You should be screened each year for STDs including gonorrhea and chlamydia if: ? You are sexually active and are younger than 79 years of age. ? You are older than 79 years of age and your health care provider tells you that you are at risk for this type of infection. ? Your sexual activity has changed since you were last screened and you are at an increased risk for chlamydia or gonorrhea. Ask your health care provider if you are at risk.  Talk with your health care provider about whether you are at high risk of being infected with HIV. Your health care provider may recommend a prescription medicine to help prevent HIV infection.  What else can I do?  Schedule regular health, dental, and eye exams.  Stay current with your vaccines (immunizations).  Do not use any tobacco products, such as cigarettes, chewing tobacco, and e-cigarettes. If you need help  quitting, ask your health care provider.  Limit alcohol intake to no more than 2 drinks per day. One drink equals 12 ounces of beer, 5 ounces of wine, or 1 ounces of hard liquor.  Do not use street drugs.  Do not share needles.  Ask your health care provider for help if you need support or information about quitting drugs.  Tell your health care provider if you often feel depressed.  Tell your health care provider if you have ever been abused or do not feel safe at home. This information is not intended to replace advice given to you by your health care provider. Make sure you discuss any questions you have with your health care provider. Document Released: 11/04/2007 Document Revised: 01/05/2016 Document Reviewed: 02/09/2015 Elsevier Interactive Patient Education  2018 [employer]  

## 2017-09-17 NOTE — Assessment & Plan Note (Signed)
Preventative protocols reviewed and updated unless pt declined. Discussed healthy diet and lifestyle.  

## 2017-09-17 NOTE — Assessment & Plan Note (Signed)
Chronic, stable. Continue current regimen. Concern for too tight control - will decrease glipizide to 1/2 tab daily with breakfast.

## 2017-09-17 NOTE — Assessment & Plan Note (Signed)
Continue coumadin.  

## 2017-09-18 NOTE — Progress Notes (Signed)
I reviewed health advisor's note, was available for consultation, and agree with documentation and plan.  

## 2017-09-22 IMAGING — CR DG CHEST 2V
2 series · 2 of 2 positions shown · non-contrast
Comparison: CT scan of the chest [DATE]

CLINICAL DATA: Preoperative examination prior to aortic valve
replacement. No current chest complaints. History of hypertension,
diabetes, chronic CHF, nonsmoker.

EXAM:
CHEST  2 VIEW

[w chest pa]
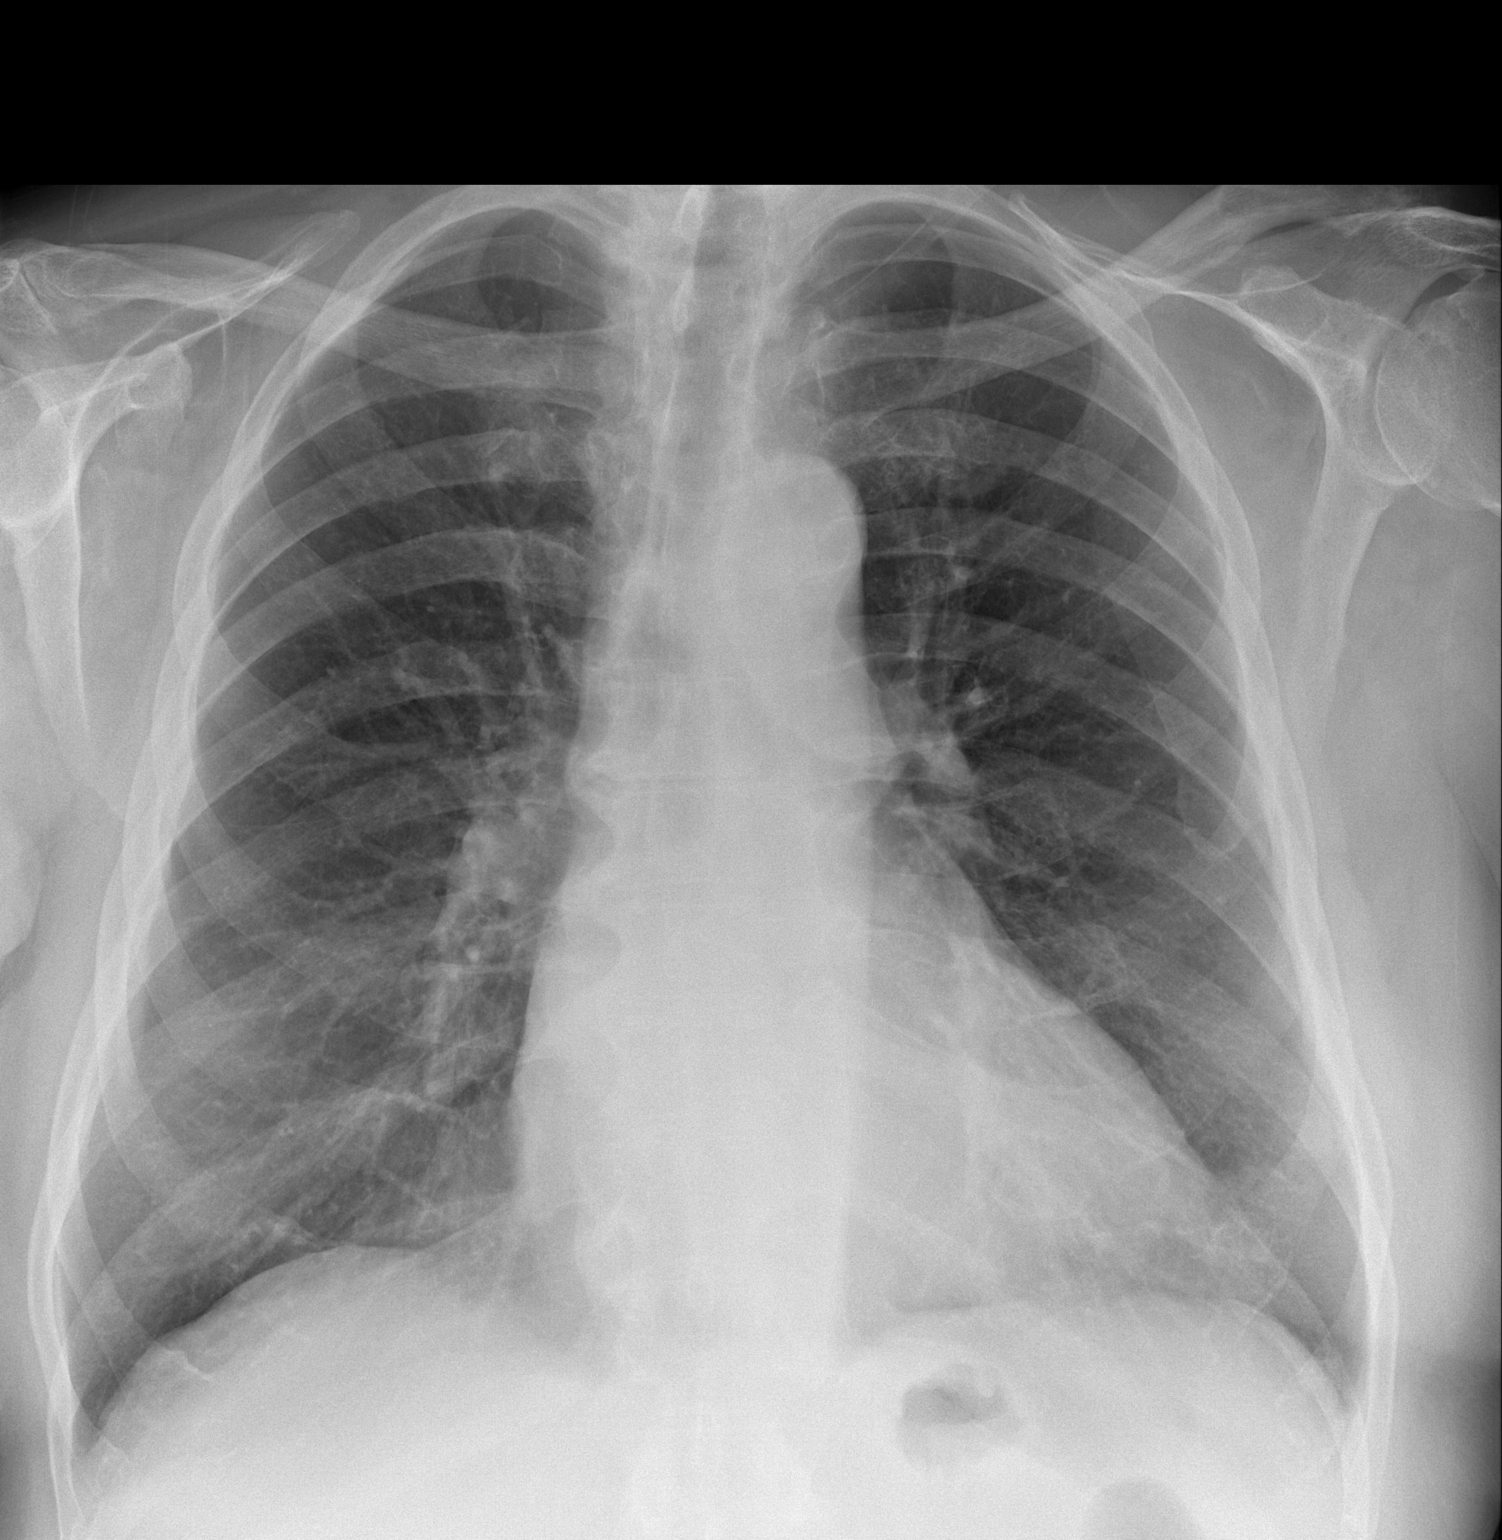

[w chest lat]
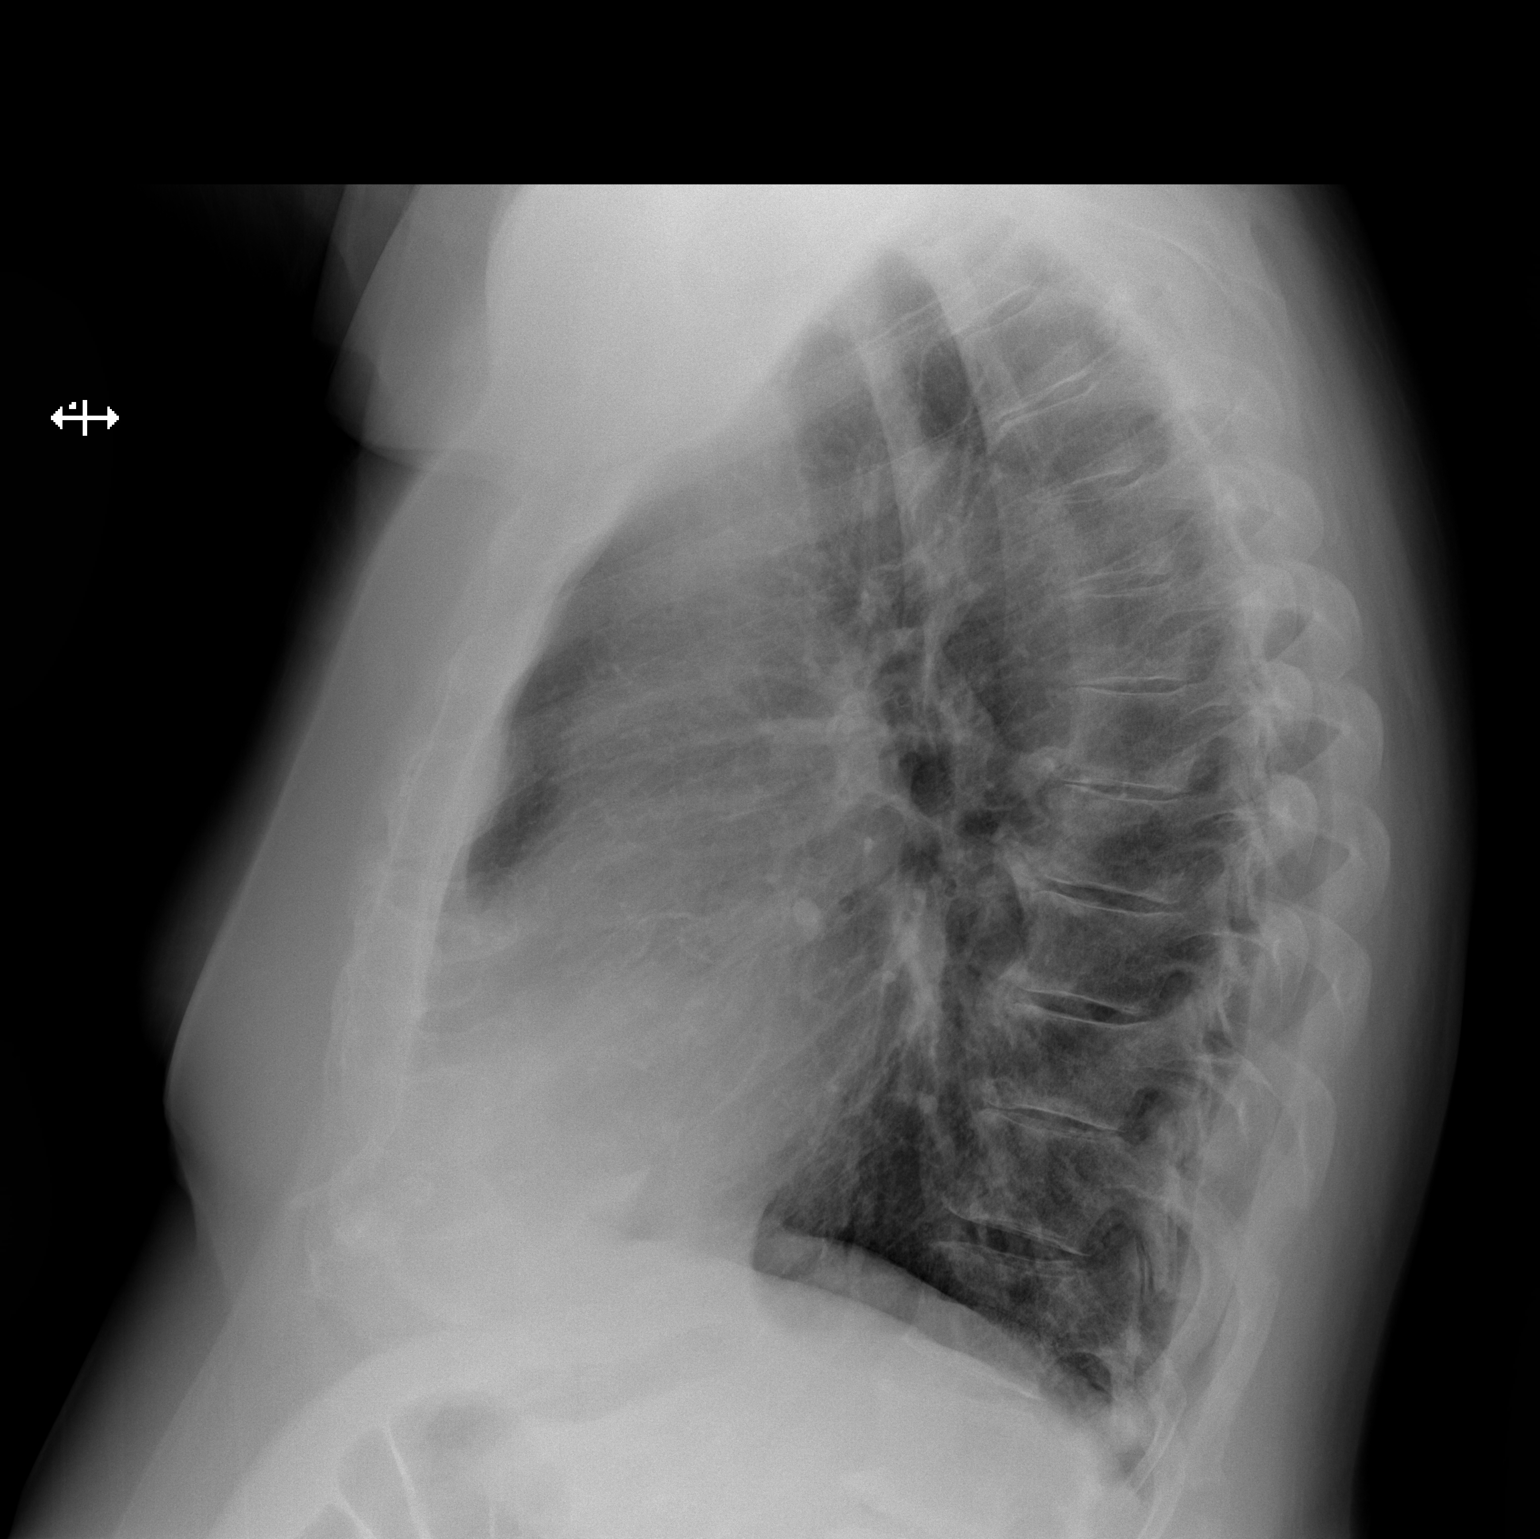

[2 of 2 positions shown; findings below may reference images not displayed]

FINDINGS: The lungs are mildly hyperinflated. There is no focal infiltrate.
There is no pleural effusion, pneumothorax, or pneumomediastinum.
The heart and pulmonary vascularity are normal. There is
calcification in the wall of the aortic arch. The mediastinum is
normal in width. There is mild multilevel degenerative disc disease
of the thoracic spine.
IMPRESSION: Mild hyperinflation which may be voluntary or may reflect underlying
chronic bronchitis. No acute cardiopulmonary abnormality.

Aortic atherosclerosis.

## 2017-09-25 IMAGING — CR DG CHEST 1V PORT
1 series · 1 of 1 positions shown · non-contrast
Comparison: 01/27/2016 .

CLINICAL DATA: Chest tube.

EXAM:
PORTABLE CHEST 1 VIEW

[AP]
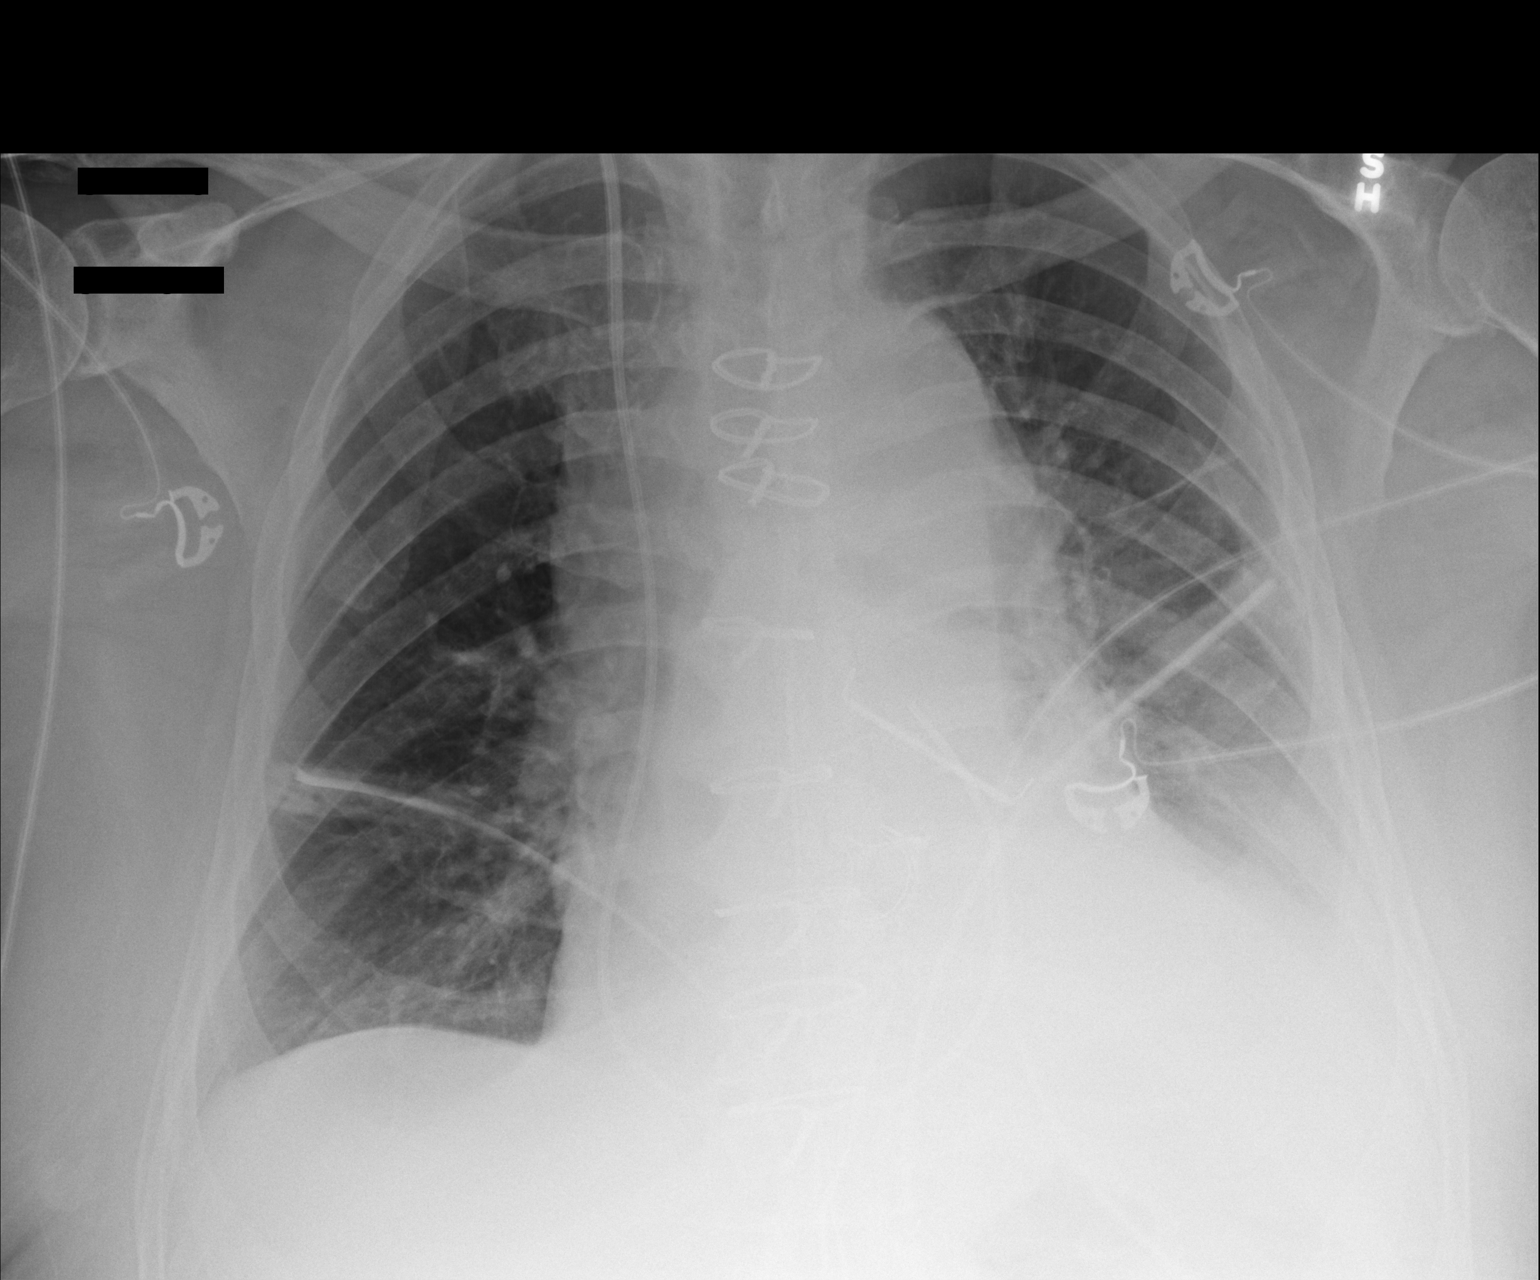

[1 of 1 positions shown; findings below may reference images not displayed]

FINDINGS: Interim extubation. Swan-Ganz catheter stable position. Mediastinal
drainage catheter stable position. Bilateral chest tubes in stable
position. No pneumothorax. Bilateral chest tubes in stable position.
Prior cardiac valve replacement. Stable cardiomegaly. Bibasilar
atelectasis. Developing left lower lobe infiltrate cannot be
excluded. Small left pleural effusion .
IMPRESSION: 1. Interim extubation. Remaining lines and tubes including bilateral
chest tubes in stable position. No pneumothorax.

2. Persistent bibasilar atelectasis. Developing left lower lobe
infiltrate. Small left pleural effusion cannot be excluded.

3.  Prior cardiac valve replacement.  Stable cardiomegaly.

## 2017-10-11 ENCOUNTER — Ambulatory Visit (INDEPENDENT_AMBULATORY_CARE_PROVIDER_SITE_OTHER): Payer: PPO | Admitting: General Practice

## 2017-10-11 DIAGNOSIS — Z7901 Long term (current) use of anticoagulants: Secondary | ICD-10-CM | POA: Diagnosis not present

## 2017-10-11 LAB — POCT INR: INR: 1.2 — AB (ref 2.0–3.0)

## 2017-10-11 NOTE — Patient Instructions (Addendum)
Pre visit review using our clinic review tool, if applicable. No additional management support is needed unless otherwise documented below in the visit note.  Take 2 tablets today, tomorrow and Saturday and then on Sunday continue to take 1 pill (2.5mg ) daily except for 1/2 pill (1.25mg ) on Sundays . Re-check in 1 weeks.  Keep servings of dark green leafy vegetables to 2 to 3 servings weekly.

## 2017-10-18 ENCOUNTER — Ambulatory Visit (INDEPENDENT_AMBULATORY_CARE_PROVIDER_SITE_OTHER): Payer: PPO | Admitting: General Practice

## 2017-10-18 DIAGNOSIS — Z7901 Long term (current) use of anticoagulants: Secondary | ICD-10-CM

## 2017-10-18 LAB — POCT INR: INR: 3.3 — AB (ref 2.0–3.0)

## 2017-10-18 NOTE — Patient Instructions (Addendum)
Pre visit review using our clinic review tool, if applicable. No additional management support is needed unless otherwise documented below in the visit note.  Continue to take 1 pill (2.5mg) daily except for 1/2 pill (1.25mg) on Sundays . Re-check in 4 weeks.  Keep servings of dark green leafy vegetables to 2 to 3 servings weekly.   

## 2017-10-23 ENCOUNTER — Other Ambulatory Visit: Payer: Self-pay | Admitting: Family Medicine

## 2017-10-24 ENCOUNTER — Other Ambulatory Visit: Payer: Self-pay | Admitting: Internal Medicine

## 2017-11-04 ENCOUNTER — Other Ambulatory Visit: Payer: Self-pay | Admitting: Family Medicine

## 2017-11-04 DIAGNOSIS — I2510 Atherosclerotic heart disease of native coronary artery without angina pectoris: Secondary | ICD-10-CM

## 2017-11-04 DIAGNOSIS — E785 Hyperlipidemia, unspecified: Secondary | ICD-10-CM

## 2017-11-15 ENCOUNTER — Ambulatory Visit (INDEPENDENT_AMBULATORY_CARE_PROVIDER_SITE_OTHER): Payer: PPO | Admitting: General Practice

## 2017-11-15 DIAGNOSIS — Z7901 Long term (current) use of anticoagulants: Secondary | ICD-10-CM

## 2017-11-15 LAB — POCT INR: INR: 1.3 — AB (ref 2.0–3.0)

## 2017-11-15 NOTE — Patient Instructions (Addendum)
Pre visit review using our clinic review tool, if applicable. No additional management support is needed unless otherwise documented below in the visit note.  Take 2 tablets today and tomorrow (6/27 and 6/28) and then continue to take 1 pill (2.5mg ) daily except for 1/2 pill (1.25mg ) on Sundays . Re-check in 2 weeks.  Keep servings of dark green leafy vegetables to 2 to 3 servings weekly.

## 2017-11-27 ENCOUNTER — Ambulatory Visit: Payer: PPO

## 2017-11-27 ENCOUNTER — Other Ambulatory Visit (INDEPENDENT_AMBULATORY_CARE_PROVIDER_SITE_OTHER): Payer: PPO

## 2017-11-27 ENCOUNTER — Ambulatory Visit: Payer: Self-pay

## 2017-11-27 DIAGNOSIS — Z7901 Long term (current) use of anticoagulants: Secondary | ICD-10-CM

## 2017-11-27 DIAGNOSIS — Z5181 Encounter for therapeutic drug level monitoring: Secondary | ICD-10-CM | POA: Diagnosis not present

## 2017-11-27 DIAGNOSIS — I4821 Permanent atrial fibrillation: Secondary | ICD-10-CM

## 2017-11-27 DIAGNOSIS — I482 Chronic atrial fibrillation: Secondary | ICD-10-CM | POA: Diagnosis not present

## 2017-11-27 LAB — POCT INR: INR: 2.2 (ref 2.0–3.0)

## 2017-11-27 NOTE — Patient Instructions (Addendum)
INR today 2.2  Continue to take 1 pill (2.5mg ) daily except for 1/2 pill (1.25mg ) on Sundays . Re-check in 4 weeks.  Keep servings of dark green leafy vegetables to 2 to 3 servings weekly. *Note patient is going on vacation for a couple of weeks in interim.  Will recheck in 4 weeks.

## 2017-11-29 ENCOUNTER — Ambulatory Visit: Payer: PPO

## 2017-12-03 ENCOUNTER — Telehealth: Payer: Self-pay

## 2017-12-03 NOTE — Telephone Encounter (Signed)
PLEASE NOTE: All timestamps contained within this report are represented as [place] Standard Time. CONFIDENTIALTY NOTICE: This fax transmission is intended only for the addressee. It contains information that is legally privileged, confidential or otherwise protected from use or disclosure. If you are not the intended recipient, you are strictly prohibited from reviewing, disclosing, copying using or disseminating any of this information or taking any action in reliance on or regarding this information. If you have received this fax in error, please notify us immediately by telephone so that we can arrange for its return to [place]. Phone: [phone], Toll-Free: [phone], Fax: [phone] Page: 1 of 3 Call Id: [id number] [place] Night - Client >>>Contains Verbal Order - Signature Required<<< [place] Patient Name: Bill [name] Gender: Male DOB: 11-28-1940 Age: 77 Y 1 M 3 D Return Phone Number: [phone] (Primary) [name] [address] Client [place] Night - Client Client Site [place] Physician [name] - MD Contact Type Call Who Is Calling Pharmacy Call Type Pharmacy Send to RN Chief Complaint Prescription Refill or Medication Request (non symptomatic) Reason for Call Request to speak to Physician Initial Comment Caller states that a mutual pt called as he is about to [place] the border to [place] & states he forgot his Warfarin at home and wants to know if a loaner dose can be called in, pharmacist states she [facility] Name [name] Translation No Nurse Assessment Nurse: [name], RN, [name] Date/Time (Eastern Time): 11/30/2017 8:02:14 PM Confirm and document reason for call. If symptomatic, describe symptoms. ---[facility], patient is almost to [place] border and forgot his Warfarin at home. He just picked up a 90 day supply and it has no refills on it. Pharmacist wants to know if loaner dose can be called in so that it can be [facility] the [facility] him at this time. Does the patient have any new or worsening symptoms? ---No Guidelines Guideline Title Affirmed Question Affirmed Notes Nurse Date/Time (Eastern Time) Disp. Time [name] Time) Disposition Final User 11/30/2017 8:04:58 PM Paged On Call back to [facility] [place] 11/30/2017 8:23:35 PM Pharmacy Call [name], RN, [name] Reason: Called in verbal order to pharmacist [name] 11/30/2017 8:23:43 PM Clinical Call Yes [name], RN, [name] PLEASE NOTE: All timestamps contained within this report are represented as [place] Standard Time. CONFIDENTIALTY NOTICE: This fax transmission is intended only for the addressee. It contains information that is legally privileged, confidential or otherwise protected from use or disclosure. If you are not the intended recipient, you are strictly prohibited from reviewing, disclosing, copying using or disseminating any of this information or taking any action in reliance on or regarding this information. If you have received this fax in error, please notify us immediately by telephone so that we can arrange for its return to [place]. Phone: [phone], Toll-Free: [phone], Fax: [phone] Page: 2 of 3 Call Id: [id number] Verbal Orders/Maintenance Medications Medication Refill Route Dosage Regime Duration Admin Instructions User Name Warfarin 2.5 mg tablets Oral Take one tablet daily by mouth at 6 pm or as direc Dispense #15 no refills [name], RN, [facility] Phone DateTime Result/Outcome Message Type Notes [name]- MD [phone] 11/30/2017 8:04:58 PM Paged On Call Back to Call Center Doctor Paged please call [name] at [phone] [name]- MD  11/30/2017 8:20:49 PM Spoke with On Call - General Message Result Spoke with on call regarding medication need. VO Warfarin 2.5 mg po daily at 6 pm or as directed. Dispense # 15 no refills. PLEASE NOTE: All timestamps contained within this report are represented as [place] Standard Time. CONFIDENTIALTY NOTICE: This fax transmission is intended only for the addressee. It contains information that is legally privileged, confidential or otherwise protected from use or disclosure. If you are not the intended recipient, you are strictly prohibited from reviewing, disclosing, copying using or disseminating any of this information or taking any action in reliance on or regarding this information. If you have received this fax in error, please notify us immediately by telephone so that we can arrange for its return to [place]. Phone: [phone], Toll-Free: [phone], Fax: [phone] Page: 3 of 3 Call Id: [id number] [place] >>>Contains Verbal Order - Signature Required<<< [address], [place] [address] [phone] Fax: [phone] MEDICATION ORDER [place] Night - Client [place] - Night Date: 11/30/2017 From: [facility] Department To: [name] - MD Please sign the order for the approved drug(s) given by our call center nurse on your behalf. Fax to [phone] within 5 business days. Thank you. Date [name] Time): 11/30/2017 7:32:07 PM Triage RN: [name], RN NAME: Bill [name] PHONE NUMBER: [phone] (Primary) BIRTHDATE: Jun 29, 1940 ADDRESS: CITY/STATE/ZIP: [place] [address] CALLER: NAME: Rx Given Medication Refill Route Dosage Regime Duration Admin Instructions User Name Warfarin 2.5 mg tablets Oral Take one tablet daily by mouth at 6 pm or as direc Dispense #15 no refills [name], RN, [name] MD Signature Date

## 2017-12-03 NOTE — Telephone Encounter (Signed)
Noted. Thanks.

## 2017-12-12 ENCOUNTER — Other Ambulatory Visit: Payer: Self-pay | Admitting: Family Medicine

## 2017-12-27 ENCOUNTER — Ambulatory Visit (INDEPENDENT_AMBULATORY_CARE_PROVIDER_SITE_OTHER): Payer: PPO | Admitting: General Practice

## 2017-12-27 DIAGNOSIS — Z7901 Long term (current) use of anticoagulants: Secondary | ICD-10-CM

## 2017-12-27 LAB — POCT INR: INR: 2.8 (ref 2.0–3.0)

## 2017-12-27 NOTE — Patient Instructions (Addendum)
Pre visit review using our clinic review tool, if applicable. No additional management support is needed unless otherwise documented below in the visit note.  Continue to take 1 pill (2.5mg ) daily except for 1/2 pill (1.25mg ) on Sundays . Re-check in 4 weeks.  Keep servings of dark green leafy vegetables to 2 to 3 servings weekly.

## 2018-01-10 ENCOUNTER — Other Ambulatory Visit: Payer: Self-pay | Admitting: Family Medicine

## 2018-01-14 ENCOUNTER — Telehealth: Payer: Self-pay

## 2018-01-14 NOTE — Telephone Encounter (Signed)
LM on answering machine (okay per DPR) for patient to call back and schedule next INR for Thursday 01/24/18.  Okay for [facility] to reschedule to 01/24/18 if patient returns call.

## 2018-01-14 NOTE — Telephone Encounter (Signed)
Copied from [place] [phone]. Topic: Appointment Scheduling - Scheduling Inquiry for Clinic >> Jan 14, 2018 10:25 AM Vernona Rieger wrote: Reason for CRM: Patient said he can not make the appt on 9/19 with Jenny Reichmann because he will be out of town. He said that he would like Jenny Reichmann to call him back because he is saying it has to be before 9/10   Call back # [phone]

## 2018-01-24 ENCOUNTER — Ambulatory Visit: Payer: PPO

## 2018-01-29 ENCOUNTER — Ambulatory Visit (INDEPENDENT_AMBULATORY_CARE_PROVIDER_SITE_OTHER): Payer: PPO

## 2018-01-29 DIAGNOSIS — Z7901 Long term (current) use of anticoagulants: Secondary | ICD-10-CM

## 2018-01-29 DIAGNOSIS — Z23 Encounter for immunization: Secondary | ICD-10-CM

## 2018-01-29 LAB — POCT INR: INR: 2 (ref 2.0–3.0)

## 2018-01-29 NOTE — Patient Instructions (Signed)
INR today 2.0   Continue to take 1 pill (2.5mg ) daily except for 1/2 pill (1.25mg ) on Sundays . Re-check in 6 weeks per patient request as he has been consistently in range of recent.  Keep servings of dark green leafy vegetables to 2 to 3 servings weekly.  Patient verbalizes understanding of all instructions given today.

## 2018-02-07 ENCOUNTER — Ambulatory Visit: Payer: PPO

## 2018-02-18 ENCOUNTER — Other Ambulatory Visit: Payer: Self-pay | Admitting: Family Medicine

## 2018-02-19 NOTE — Telephone Encounter (Signed)
Patient is compliant with coumadin management, will refill x 6 months.    

## 2018-03-04 DIAGNOSIS — H40023 Open angle with borderline findings, high risk, bilateral: Secondary | ICD-10-CM | POA: Diagnosis not present

## 2018-03-14 ENCOUNTER — Ambulatory Visit (INDEPENDENT_AMBULATORY_CARE_PROVIDER_SITE_OTHER): Payer: PPO | Admitting: General Practice

## 2018-03-14 DIAGNOSIS — I4891 Unspecified atrial fibrillation: Secondary | ICD-10-CM

## 2018-03-14 LAB — POCT INR: INR: 1.9 — AB (ref 2.0–3.0)

## 2018-03-14 NOTE — Patient Instructions (Signed)
Pre visit review using our clinic review tool, if applicable. No additional management support is needed unless otherwise documented below in the visit note.  Take 2 tablets today and then continue to take 1 pill (2.5mg ) daily except for 1/2 pill (1.25mg ) on Sundays . Re-check in 6 weeks pre patient request.  Keep servings of dark green leafy vegetables to 2 to 3 servings weekly Patient verbalizes understanding of all instructions given today.

## 2018-03-15 ENCOUNTER — Other Ambulatory Visit: Payer: Self-pay | Admitting: Family Medicine

## 2018-03-19 ENCOUNTER — Ambulatory Visit: Payer: PPO | Admitting: Family Medicine

## 2018-03-21 ENCOUNTER — Ambulatory Visit: Payer: PPO | Admitting: Family Medicine

## 2018-03-27 ENCOUNTER — Ambulatory Visit: Payer: PPO | Admitting: Family Medicine

## 2018-03-29 ENCOUNTER — Ambulatory Visit (INDEPENDENT_AMBULATORY_CARE_PROVIDER_SITE_OTHER): Payer: PPO | Admitting: Family Medicine

## 2018-03-29 ENCOUNTER — Encounter: Payer: Self-pay | Admitting: Family Medicine

## 2018-03-29 VITALS — BP 140/68 | HR 84 | Temp 97.8°F | Ht 72.0 in | Wt 232.8 lb

## 2018-03-29 DIAGNOSIS — I4891 Unspecified atrial fibrillation: Secondary | ICD-10-CM | POA: Diagnosis not present

## 2018-03-29 DIAGNOSIS — M545 Low back pain: Secondary | ICD-10-CM | POA: Diagnosis not present

## 2018-03-29 DIAGNOSIS — E11319 Type 2 diabetes mellitus with unspecified diabetic retinopathy without macular edema: Secondary | ICD-10-CM | POA: Diagnosis not present

## 2018-03-29 DIAGNOSIS — G8929 Other chronic pain: Secondary | ICD-10-CM | POA: Diagnosis not present

## 2018-03-29 DIAGNOSIS — E1151 Type 2 diabetes mellitus with diabetic peripheral angiopathy without gangrene: Secondary | ICD-10-CM

## 2018-03-29 DIAGNOSIS — G252 Other specified forms of tremor: Secondary | ICD-10-CM

## 2018-03-29 DIAGNOSIS — R259 Unspecified abnormal involuntary movements: Secondary | ICD-10-CM | POA: Diagnosis not present

## 2018-03-29 DIAGNOSIS — E1142 Type 2 diabetes mellitus with diabetic polyneuropathy: Secondary | ICD-10-CM | POA: Diagnosis not present

## 2018-03-29 LAB — POCT GLYCOSYLATED HEMOGLOBIN (HGB A1C): Hemoglobin A1C: 5.2 % (ref 4.0–5.6)

## 2018-03-29 NOTE — Assessment & Plan Note (Signed)
Again noted today. No pain with this or other symptoms. He monitors feet daily.

## 2018-03-29 NOTE — Assessment & Plan Note (Signed)
Encouraged increased tylenol to 500-650mg  BID.

## 2018-03-29 NOTE — Assessment & Plan Note (Signed)
Followed by coumadin clinic 

## 2018-03-29 NOTE — Assessment & Plan Note (Signed)
Chronic, stable. Suggested he hold glipizide, continue metformin. May restart 1/2 tab if sugars trending up.  Declines DSME at this time.

## 2018-03-29 NOTE — Patient Instructions (Addendum)
Try off glipizide. If sugars trend up, may restart 1/2 tablet with breakfast.  May take tylenol 500-650mg  twice daily for back.  Continue current medicines. Return as needed or in 6 months for wellness visit.

## 2018-03-29 NOTE — Progress Notes (Signed)
BP 140/68 (BP Location: Left Arm, Patient Position: Sitting, Cuff Size: Large)   Pulse 84   Temp 97.8 F (36.6 C) (Oral)   Ht 6' (1.829 m)   Wt 232 lb 12 oz (105.6 kg)   SpO2 97%   BMI 31.57 kg/m    CC: DM f/u visit Subjective:    Patient ID: Bill [name], male    DOB: 12/22/1940, 77 y.o.   MRN: [id number]  HPI: Bill [name] is a 77 y.o. male presenting on 03/29/2018 for Diabetes (Here for f/u. )   DM - does regularly check sugars fasting in the morning - 100-120. Compliant with antihyperglycemic regimen which includes: metformin 500mg  bid, glipizide 5mg  1/2 tab daily. Denies low sugars or hypoglycemic symptoms. Denies paresthesias. Last diabetic eye exam 08/2017. Pneumovax: 2007. Prevnar: 2015. Glucometer brand: one-touch. DSME: doesn't think he's completed.  Lab Results  Component Value Date   HGBA1C 5.2 03/29/2018   Diabetic Foot Exam - Simple   Simple Foot Form Diabetic Foot exam was performed with the following findings:  Yes 03/29/2018  8:47 AM  Visual Inspection No deformities, no ulcerations, no other skin breakdown bilaterally:  Yes Sensation Testing See comments:  Yes Pulse Check Posterior Tibialis and Dorsalis pulse intact bilaterally:  Yes Comments Diminished sensation to monofilament    Lab Results  Component Value Date   MICROALBUR 90.0 (H) 09/07/2016     Ongoing lower back pain and R knee pain. Worse with prolonged walking. No fevers, numbness or weakness of legs, saddle anesthesia, radiculopathy.   Relevant past medical, surgical, family and social history reviewed and updated as indicated. Interim medical history since our last visit reviewed. Allergies and medications reviewed and updated. Outpatient Medications Prior to Visit  Medication Sig Dispense Refill  . acetaminophen (TYLENOL) 325 MG tablet Take 650 mg by mouth every 4 (four) hours as needed for mild pain or fever.    . cholecalciferol (VITAMIN D) 1000 UNITS tablet Take 1,000 Units by  mouth as directed.     Marland Kitchen glipiZIDE (GLUCOTROL) 5 MG tablet TAKE 1 TABLET (5 MG TOTAL) BY MOUTH DAILY BEFORE BREAKFAST. 90 tablet 0  . lisinopril (PRINIVIL,ZESTRIL) 2.5 MG tablet TAKE 1 TABLET BY MOUTH EVERY DAY 90 tablet 0  . metFORMIN (GLUCOPHAGE) 500 MG tablet TAKE 1 TABLET (500 MG TOTAL) BY MOUTH 2 (TWO) TIMES DAILY WITH A MEAL. 180 tablet 1  . Multiple Vitamin (MULTIVITAMIN WITH MINERALS) TABS tablet Take by mouth daily. 1 packet of 5 vitamins daily    . multivitamin-lutein (OCUVITE-LUTEIN) CAPS capsule Take 3 capsules by mouth as directed.     . ONE TOUCH ULTRA TEST test strip use to check sugar once daily. 100 each 3  . oxybutynin (DITROPAN) 5 MG tablet TAKE 1 TABLET (5 MG TOTAL) BY MOUTH 3 (THREE) TIMES DAILY. 270 tablet 2  . pravastatin (PRAVACHOL) 20 MG tablet TAKE 1 TABLET (20 MG TOTAL) BY MOUTH EVERY EVENING. 90 tablet 2  . vitamin B-12 (CYANOCOBALAMIN) 1000 MCG tablet Take 1,000 mcg by mouth as directed.     . warfarin (COUMADIN) 2.5 MG tablet TAKE 1 TABLET (2.5 MG TOTAL) BY MOUTH DAILY AT 6 PM. OR AS DIRECTED. 90 tablet 1  . glipiZIDE (GLUCOTROL) 5 MG tablet Take 0.5 tablets (2.5 mg total) by mouth daily before breakfast.    . Halcinonide (HALOG) 0.1 % OINT Apply topically.  0  . pravastatin (PRAVACHOL) 20 MG tablet Take 20 mg by mouth daily.     No facility-administered  medications prior to visit.      Per HPI unless specifically indicated in ROS section below Review of Systems     Objective:    BP 140/68 (BP Location: Left Arm, Patient Position: Sitting, Cuff Size: Large)   Pulse 84   Temp 97.8 F (36.6 C) (Oral)   Ht 6' (1.829 m)   Wt 232 lb 12 oz (105.6 kg)   SpO2 97%   BMI 31.57 kg/m   Wt Readings from Last 3 Encounters:  03/29/18 232 lb 12 oz (105.6 kg)  09/17/17 230 lb (104.3 kg)  09/11/17 232 lb 8 oz (105.5 kg)    Physical Exam  Constitutional: He appears well-developed and well-nourished. No distress.  HENT:  Head: Normocephalic and atraumatic.  Right  Ear: External ear normal.  Left Ear: External ear normal.  Nose: Nose normal.  Mouth/Throat: Oropharynx is clear and moist. No oropharyngeal exudate.  Eyes: Pupils are equal, round, and reactive to light. Conjunctivae and EOM are normal. No scleral icterus.  Neck: Normal range of motion. Neck supple.  Cardiovascular: Normal rate, regular rhythm and intact distal pulses.  Murmur (3/6 systolic) heard. Pulmonary/Chest: Effort normal and breath sounds normal. No respiratory distress. He has no wheezes. He has no rales.  Musculoskeletal: He exhibits no edema.  See HPI for foot exam if done No midline spine or paraspinous mm tenderness to palpation.   Lymphadenopathy:    He has no cervical adenopathy.  Neurological: He is alert.  Persistent resting tremor. Mild cogwheel rigidity LUE No shuffling gait  Skin: Skin is warm and dry. No rash noted.  Psychiatric: He has a normal mood and affect.  Nursing note and vitals reviewed.  Results for orders placed or performed in visit on 03/29/18  POCT glycosylated hemoglobin (Hb A1C)  Result Value Ref Range   Hemoglobin A1C 5.2 4.0 - 5.6 %   HbA1c POC (<> result, manual entry)     HbA1c, POC (prediabetic range)     HbA1c, POC (controlled diabetic range)     Lab Results  Component Value Date   WBC 5.9 09/11/2017   HGB 13.1 09/11/2017   HCT 39.5 09/11/2017   MCV 96.9 09/11/2017   PLT 230.0 09/11/2017       Assessment & Plan:   Problem List Items Addressed This Visit    Type 2 diabetes, controlled, with retinopathy ([place]) - Primary (Chronic)    Chronic, stable. Suggested he hold glipizide, continue metformin. May restart 1/2 tab if sugars trending up.  Declines DSME at this time.       Relevant Orders   POCT glycosylated hemoglobin (Hb A1C) (Completed)   Resting tremor   Diabetic peripheral vascular disease ([place])   Diabetic peripheral neuropathy associated with type 2 diabetes mellitus ([place])    Again noted today. No pain with this or  other symptoms. He monitors feet daily.       Chronic lower back pain (Chronic)    Encouraged increased tylenol to 500-650mg  BID.       Atrial fibrillation ([place]) (Chronic)    Followed by coumadin clinic.           No orders of the defined types were placed in this encounter.  Orders Placed This Encounter  Procedures  . POCT glycosylated hemoglobin (Hb A1C)    Follow up plan: Return in about 6 months (around 09/27/2018) for medicare wellness visit, annual exam, prior fasting for blood work.  [name], MD

## 2018-04-09 ENCOUNTER — Encounter: Payer: Self-pay | Admitting: Cardiovascular Disease

## 2018-04-10 ENCOUNTER — Encounter: Payer: Self-pay | Admitting: Cardiovascular Disease

## 2018-04-10 ENCOUNTER — Ambulatory Visit: Payer: PPO | Admitting: Cardiovascular Disease

## 2018-04-10 VITALS — BP 142/80 | HR 82 | Ht 72.0 in | Wt 230.0 lb

## 2018-04-10 DIAGNOSIS — I359 Nonrheumatic aortic valve disorder, unspecified: Secondary | ICD-10-CM

## 2018-04-10 DIAGNOSIS — I498 Other specified cardiac arrhythmias: Secondary | ICD-10-CM | POA: Diagnosis not present

## 2018-04-10 DIAGNOSIS — I480 Paroxysmal atrial fibrillation: Secondary | ICD-10-CM

## 2018-04-10 DIAGNOSIS — I5032 Chronic diastolic (congestive) heart failure: Secondary | ICD-10-CM

## 2018-04-10 NOTE — Patient Instructions (Signed)
Medication Instructions:  Your provider recommends that you continue on your current medications as directed. Please refer to the Current Medication list given to you today.    Labwork: None  Testing/Procedures: None  Follow-Up: Your provider wants you to follow-up in: 1 year with Dr. Cooper or his assistant. You will receive a reminder letter in the mail two months in advance. If you don't receive a letter, please call our office to schedule the follow-up appointment.    Any Other Special Instructions Will Be Listed Below (If Applicable).     If you need a refill on your cardiac medications before your next appointment, please call your pharmacy.   

## 2018-04-10 NOTE — Progress Notes (Signed)
Cardiology Office Note:    Date:  04/10/2018   ID:  Bill [name], DOB 03/05/1941, MRN [id number]  PCP:  [name], MD  Cardiologist:  [name], MD  Electrophysiologist:  None   Referring MD: [name], MD   Chief Complaint  Patient presents with  . Shortness of Breath    History of Present Illness:    Bill [name] is a 77 y.o. male with a hx of aortic valve disease status post bioprosthetic aortic valve replacement, persistent atrial fibrillation status post Maze procedure and left atrial appendage clip, and chronic diastolic heart failure.  The patient presents for follow-up evaluation.  The patient has had persistent atrial fibrillation even on dofetilide and this was ultimately discontinued.  He has been treated with rate control and chronic oral anticoagulation with warfarin.  The patient is here alone today.  He is doing fairly well.  He has chronic exertional dyspnea which has not changed over several years.  He really has not noticed any significant change in his symptoms even with aortic valve replacement.  He denies orthopnea, PND, or leg swelling.  Chest pain or pressure.  He denies heart palpitations, lightheadedness, or syncope.  He is been compliant with warfarin.  He has not been on aspirin for quite some time.  His weight has plateaued, but he lost over 70 pounds from his baseline weight back in 2012 when he was in excess of 300 pounds.  Past Medical History:  Diagnosis Date  . Aortic stenosis 12/2010   s/p bioprosthetic AVR 9/17 // Echo 6/17: EF 55-60, mean AV 25 mmHg // TEE 6/17: severe AS, AVA 0.93, mean AV 43 mmHg // post op Echo 02/07/16: mod conc LVH, EF 65-70, vigorous LVF, AVR ok, mean 9 mmHg, MAC, trivial MR, mod LAE, trivial pericardial eff  . Chronic diastolic CHF (congestive heart failure) ([place])   . Coronary artery disease    LHC 8/17: pLAD 20, mLAD 30, dLAD 20, mRCA 30, dRCA 20, RPDA 20  . Exogenous obesity   . GERD  (gastroesophageal reflux disease)    occasional  . History of chicken pox   . Hypertension   . Kidney stones remote  . Lyme disease 2012   ?titers negative  . Neuromuscular disorder (HCC)    tremor right arm  . Persistent atrial fibrillation    Tikosyn // s/p Maze procedure 9/17 // Anticoagulation undesirable 2/2 hx of retinal hemorrhage (worked up for [employer] but referred for Maze at time of AVR 2/2 aortic stenosis)   . PFO (patent foramen ovale)    s/p PFO closure at time of AVR in 9/17  . S/P aortic valve replacement with bioprosthetic valve 01/27/2016   27 mm [facility] Ease bovine pericardial tissue valve  . S/P Maze operation for atrial fibrillation 01/27/2016   Complete bilateral atrial lesion set using bipolar radiofrequency and cryothermy ablation with clipping of LA appendage  . Type 2 diabetes, controlled, with retinopathy ([name]) 1980s  . Urge incontinence   . Vitamin B12 deficiency    IF normal (2014)    Past Surgical History:  Procedure Laterality Date  . AORTIC VALVE REPLACEMENT N/A 01/27/2016   Procedure: AORTIC VALVE REPLACEMENT (AVR);  Surgeon: Rexene Alberts, MD;  Location: [place];  Service: Open Heart Surgery;  Laterality: N/A;  . BACK SURGERY  20 yrs ago   removal of lower back cartilage due to damage. Patient does not remember date of surgery.  Marland Kitchen CARDIAC CATHETERIZATION N/A 01/05/2016  Procedure: Right/Left Heart Cath and Coronary Angiography;  Surgeon: Burnell Blanks, MD;  Location: [place] [facility];  Service: Cardiovascular;  Laterality: N/A;  . CARDIOVERSION N/A 02/25/2015   Procedure: CARDIOVERSION;  Surgeon: Skeet Latch, MD;  Location: [facility] ENDOSCOPY;  Service: Cardiovascular;  Laterality: N/A;  . CATARACT EXTRACTION Bilateral 2015   [place]  . COLONOSCOPY    . EYE SURGERY     left eye "cleaned"  . finger sx    . kidney stone removal    . MAZE N/A 01/27/2016   Procedure: MAZE;  Surgeon: Rexene Alberts, MD;  Location: [place];   Service: Open Heart Surgery;  Laterality: N/A;  . TEE WITHOUT CARDIOVERSION N/A 11/05/2015   Procedure: TRANSESOPHAGEAL ECHOCARDIOGRAM (TEE);  Surgeon: Pixie Casino, MD;  Location: [place];  Service: Cardiovascular;  Laterality: N/A;  . TEE WITHOUT CARDIOVERSION N/A 01/27/2016   Procedure: TRANSESOPHAGEAL ECHOCARDIOGRAM (TEE);  Surgeon: Rexene Alberts, MD;  Location: [place];  Service: Open Heart Surgery;  Laterality: N/A;  . TOTAL HIP ARTHROPLASTY  03/12/2012   RIGHT TOTAL HIP ARTHROPLASTY ANTERIOR APPROACH;  Surgeon: Mcarthur Rossetti, MD;  . US ECHOCARDIOGRAPHY  12/2010   EF 50-55%, mild LVH, normal wall motion, high LV filling pressures, mild AS, LA mildly dilated    Current Medications: Current Meds  Medication Sig  . acetaminophen (TYLENOL) 325 MG tablet Take 650 mg by mouth every 4 (four) hours as needed for mild pain or fever.  . cholecalciferol (VITAMIN D) 1000 UNITS tablet Take 1,000 Units by mouth as directed.   Marland Kitchen lisinopril (PRINIVIL,ZESTRIL) 2.5 MG tablet TAKE 1 TABLET BY MOUTH EVERY DAY  . metFORMIN (GLUCOPHAGE) 500 MG tablet TAKE 1 TABLET (500 MG TOTAL) BY MOUTH 2 (TWO) TIMES DAILY WITH A MEAL.  . Multiple Vitamin (MULTIVITAMIN WITH MINERALS) TABS tablet Take by mouth daily. 1 packet of 5 vitamins daily  . multivitamin-lutein (OCUVITE-LUTEIN) CAPS capsule Take 3 capsules by mouth as directed.   . ONE TOUCH ULTRA TEST test strip use to check sugar once daily.  Marland Kitchen oxybutynin (DITROPAN) 5 MG tablet TAKE 1 TABLET (5 MG TOTAL) BY MOUTH 3 (THREE) TIMES DAILY.  . vitamin B-12 (CYANOCOBALAMIN) 1000 MCG tablet Take 1,000 mcg by mouth as directed.   . warfarin (COUMADIN) 2.5 MG tablet TAKE 1 TABLET (2.5 MG TOTAL) BY MOUTH DAILY AT 6 PM. OR AS DIRECTED.     Allergies:   No known allergies   Social History   Socioeconomic History  . Marital status: Married    Spouse name: Not on file  . Number of children: Not on file  . Years of education: Not on file  . Highest  education level: Not on file  Occupational History  . Not on file  Social Needs  . Financial resource strain: Not on file  . Food insecurity:    Worry: Not on file    Inability: Not on file  . Transportation needs:    Medical: Not on file    Non-medical: Not on file  Tobacco Use  . Smoking status: Never Smoker  . Smokeless tobacco: Never Used  Substance and Sexual Activity  . Alcohol use: Yes    Comment: Regular (bourbon and coke 2-3/day)  . Drug use: No  . Sexual activity: Never  Lifestyle  . Physical activity:    Days per week: Not on file    Minutes per session: Not on file  . Stress: Not on file  Relationships  .  Social connections:    Talks on phone: Not on file    Gets together: Not on file    Attends religious service: Not on file    Active member of club or organization: Not on file    Attends meetings of clubs or organizations: Not on file    Relationship status: Not on file  Other Topics Concern  . Not on file  Social History Narrative   Caffeine: coffee 1 cup/day   Lives with wife [name]), 1 cat   Occupation: retired, worked for [employer]   Edu: BS   Activity: no regular exercise   Diet: some water, fruits/vegetables daily      Advanced directives: has living will at home. Does not want prolonged life support "no extraordinary measures" but would accept temporary measures. Would want wife to be HCPOA.      Family History: The patient's family history includes Cancer in his brother; Diabetes in his father and mother; Heart disease in his father and mother.  ROS:   Please see the history of present illness.    All other systems reviewed and are negative.  EKGs/Labs/Other Studies Reviewed:    EKG:  EKG is ordered today.  The ekg ordered today demonstrates accelerated junctional rhythm 82 bpm, cannot rule out anterior infarct age undetermined, low voltage QRS  Recent Labs: 09/11/2017: ALT 17; BUN 26; Creatinine, Ser 1.07; Hemoglobin 13.1; Platelets 230.0;  Potassium 5.3; Sodium 136  Recent Lipid Panel    Component Value Date/Time   CHOL 147 09/11/2017 1308   TRIG 43.0 09/11/2017 1308   TRIG 71 04/17/2006 1259   HDL 78.50 09/11/2017 1308   CHOLHDL 2 09/11/2017 1308   VLDL 8.6 09/11/2017 1308   LDLCALC 60 09/11/2017 1308   LDLDIRECT 124.4 12/31/2012 0739    Physical Exam:    VS:  BP (!) 142/80   Pulse 82   Ht 6' (1.829 m)   Wt 230 lb (104.3 kg)   SpO2 96%   BMI 31.19 kg/m     Wt Readings from Last 3 Encounters:  04/10/18 230 lb (104.3 kg)  03/29/18 232 lb 12 oz (105.6 kg)  09/17/17 230 lb (104.3 kg)     GEN: Well nourished, well developed in no acute distress HEENT: Normal NECK: No JVD; No carotid bruits LYMPHATICS: No lymphadenopathy CARDIAC: RRR, grade 2/6 systolic ejection murmur at the right upper sternal border, no diastolic murmur RESPIRATORY:  Clear to auscultation without rales, wheezing or rhonchi  ABDOMEN: Soft, non-tender, non-distended MUSCULOSKELETAL:  No edema; No deformity  SKIN: Warm and dry NEUROLOGIC:  Alert and oriented x 3 PSYCHIATRIC:  Normal affect   ASSESSMENT:    1. Aortic valve disease   2. Accelerated junctional rhythm   3. Paroxysmal atrial fibrillation (HCC)   4. Chronic diastolic heart failure (HCC)    PLAN:    In order of problems listed above:  1. The patient appears stable, New York Heart Association functional class II symptoms of chronic diastolic heart failure.  Physical exam is unchanged.   2. The patient is status post maze.  He is having no symptoms related to this.  His EKG is essentially unchanged from his previous tracing last year. 3. Tolerating oral anticoagulation with warfarin.  History of left atrial appendage clip. 4. Stable symptoms.  Encouraged him to continue with his walking program.  He will continue to focus on weight loss.  He is been off of diuretic therapy for some time now.   Medication Adjustments/Labs and  Tests Ordered: Current medicines are reviewed at  length with the patient today.  Concerns regarding medicines are outlined above.  Orders Placed This Encounter  Procedures  . EKG 12-Lead   No orders of the defined types were placed in this encounter.   Patient Instructions  Medication Instructions:  Your provider recommends that you continue on your current medications as directed. Please refer to the Current Medication list given to you today.    Labwork: None  Testing/Procedures: None  Follow-Up: Your provider wants you to follow-up in: 1 year with Dr. [name] or his assistant. You will receive a reminder letter in the mail two months in advance. If you don't receive a letter, please call our office to schedule the follow-up appointment.    Any Other Special Instructions Will Be Listed Below (If Applicable).     If you need a refill on your cardiac medications before your next appointment, please call your pharmacy.      Signed, [name], MD  04/10/2018 10:03 AM    [place]

## 2018-04-16 ENCOUNTER — Encounter: Payer: Self-pay | Admitting: Family Medicine

## 2018-04-25 ENCOUNTER — Ambulatory Visit (INDEPENDENT_AMBULATORY_CARE_PROVIDER_SITE_OTHER): Payer: PPO | Admitting: General Practice

## 2018-04-25 DIAGNOSIS — Z7901 Long term (current) use of anticoagulants: Secondary | ICD-10-CM

## 2018-04-25 LAB — POCT INR: INR: 4.3 — AB (ref 2.0–3.0)

## 2018-04-25 NOTE — Patient Instructions (Addendum)
Pre visit review using our clinic review tool, if applicable. No additional management support is needed unless otherwise documented below in the visit note.  Hold coumadin today and tomorrow (12/5 12/6) and then change dosage and take 1 pill (2.5mg ) daily except for 1/2 pill (1.25mg ) on Sundays and Wednesdays.  Re-check in 3 weeks.  Keep servings of dark green leafy vegetables to 2 to 3 servings weekly Patient verbalizes understanding of all instructions given today.

## 2018-04-27 ENCOUNTER — Other Ambulatory Visit: Payer: Self-pay | Admitting: Family Medicine

## 2018-05-09 ENCOUNTER — Ambulatory Visit (INDEPENDENT_AMBULATORY_CARE_PROVIDER_SITE_OTHER): Payer: PPO | Admitting: General Practice

## 2018-05-09 DIAGNOSIS — Z7901 Long term (current) use of anticoagulants: Secondary | ICD-10-CM | POA: Diagnosis not present

## 2018-05-09 LAB — POCT INR: INR: 2.7 (ref 2.0–3.0)

## 2018-05-09 NOTE — Patient Instructions (Signed)
Pre visit review using our clinic review tool, if applicable. No additional management support is needed unless otherwise documented below in the visit note.  Continue to take 1 tablet daily except 1/2 on Sunday and Wednesdays.  Re-check in 4 weeks.  Keep servings of dark green leafy vegetables to 2 to 3 servings weekly Patient verbalizes understanding of all instructions given today.

## 2018-06-08 ENCOUNTER — Other Ambulatory Visit: Payer: Self-pay | Admitting: Family Medicine

## 2018-06-11 ENCOUNTER — Other Ambulatory Visit: Payer: Self-pay | Admitting: Family Medicine

## 2018-06-11 NOTE — Telephone Encounter (Signed)
There is a note in refill request stating lisinopril is no longer a preferred formulary in pt's ins plan and gives a list of alternatives.

## 2018-06-12 ENCOUNTER — Ambulatory Visit: Payer: PPO | Admitting: Cardiovascular Disease

## 2018-06-19 ENCOUNTER — Other Ambulatory Visit: Payer: Self-pay | Admitting: Family Medicine

## 2018-06-19 ENCOUNTER — Encounter: Payer: Self-pay | Admitting: Family Medicine

## 2018-06-19 MED ORDER — GLIPIZIDE 5 MG PO TABS: 2.5000 mg | ORAL_TABLET | Freq: Every day | ORAL | 0 refills | Status: DC

## 2018-06-20 ENCOUNTER — Ambulatory Visit (INDEPENDENT_AMBULATORY_CARE_PROVIDER_SITE_OTHER): Payer: PPO | Admitting: General Practice

## 2018-06-20 DIAGNOSIS — Z7901 Long term (current) use of anticoagulants: Secondary | ICD-10-CM | POA: Diagnosis not present

## 2018-06-20 LAB — POCT INR: INR: 1.8 — AB (ref 2.0–3.0)

## 2018-06-20 NOTE — Telephone Encounter (Signed)
He is taking glipizide 2.5mg  daily.

## 2018-06-20 NOTE — Patient Instructions (Addendum)
Pre visit review using our clinic review tool, if applicable. No additional management support is needed unless otherwise documented below in the visit note.  Take 2 tablets today (1/30) and then continue to take 1 tablet daily except 1/2 on Sunday and Wednesdays.  Re-check in 4 weeks.  Keep servings of dark green leafy vegetables to 2 to 3 servings weekly Patient verbalizes understanding of all instructions given today.

## 2018-06-26 DIAGNOSIS — L570 Actinic keratosis: Secondary | ICD-10-CM | POA: Diagnosis not present

## 2018-06-26 DIAGNOSIS — L578 Other skin changes due to chronic exposure to nonionizing radiation: Secondary | ICD-10-CM | POA: Diagnosis not present

## 2018-06-26 NOTE — Progress Notes (Signed)
reviewed

## 2018-07-18 ENCOUNTER — Ambulatory Visit: Payer: PPO | Admitting: General Practice

## 2018-07-18 DIAGNOSIS — Z7901 Long term (current) use of anticoagulants: Secondary | ICD-10-CM

## 2018-07-18 LAB — POCT INR: INR: 2.7 (ref 2.0–3.0)

## 2018-07-18 NOTE — Patient Instructions (Addendum)
Pre visit review using our clinic review tool, if applicable. No additional management support is needed unless otherwise documented below in the visit note.  Continue to take 1 tablet daily except 1/2 tablet on Sunday and Wednesdays.  Re-check in 6 weeks per patient request.  Keep servings of dark green leafy vegetables to 2 to 3 servings weekly Patient verbalizes understanding of all instructions given today.   

## 2018-07-25 ENCOUNTER — Other Ambulatory Visit: Payer: Self-pay

## 2018-07-25 MED ORDER — GLUCOSE BLOOD VI STRP: ORAL_STRIP | 0 refills | Status: DC

## 2018-07-25 NOTE — Telephone Encounter (Signed)
E-scribed refill 

## 2018-07-26 DIAGNOSIS — L819 Disorder of pigmentation, unspecified: Secondary | ICD-10-CM | POA: Diagnosis not present

## 2018-07-26 DIAGNOSIS — L578 Other skin changes due to chronic exposure to nonionizing radiation: Secondary | ICD-10-CM | POA: Diagnosis not present

## 2018-08-16 ENCOUNTER — Other Ambulatory Visit: Payer: Self-pay | Admitting: Family Medicine

## 2018-08-16 DIAGNOSIS — E785 Hyperlipidemia, unspecified: Secondary | ICD-10-CM

## 2018-08-16 DIAGNOSIS — I2510 Atherosclerotic heart disease of native coronary artery without angina pectoris: Secondary | ICD-10-CM

## 2018-09-05 ENCOUNTER — Ambulatory Visit (INDEPENDENT_AMBULATORY_CARE_PROVIDER_SITE_OTHER): Payer: PPO | Admitting: General Practice

## 2018-09-05 ENCOUNTER — Other Ambulatory Visit (INDEPENDENT_AMBULATORY_CARE_PROVIDER_SITE_OTHER): Payer: PPO

## 2018-09-05 DIAGNOSIS — I4891 Unspecified atrial fibrillation: Secondary | ICD-10-CM

## 2018-09-05 DIAGNOSIS — Z7901 Long term (current) use of anticoagulants: Secondary | ICD-10-CM

## 2018-09-05 LAB — POCT INR: INR: 2.2 (ref 2.0–3.0)

## 2018-09-05 NOTE — Patient Instructions (Addendum)
Pre visit review using our clinic review tool, if applicable. No additional management support is needed unless otherwise documented below in the visit note.  Continue to take 1 tablet daily except 1/2 tablet on Sunday and Wednesdays.  Re-check in 6 weeks per patient request.  Keep servings of dark green leafy vegetables to 2 to 3 servings weekly Patient verbalizes understanding of all instructions given today.

## 2018-09-12 ENCOUNTER — Other Ambulatory Visit: Payer: Self-pay | Admitting: Family Medicine

## 2018-09-24 ENCOUNTER — Ambulatory Visit: Payer: PPO

## 2018-09-25 ENCOUNTER — Ambulatory Visit (INDEPENDENT_AMBULATORY_CARE_PROVIDER_SITE_OTHER): Payer: PPO

## 2018-09-25 ENCOUNTER — Other Ambulatory Visit (INDEPENDENT_AMBULATORY_CARE_PROVIDER_SITE_OTHER): Payer: PPO

## 2018-09-25 ENCOUNTER — Telehealth (INDEPENDENT_AMBULATORY_CARE_PROVIDER_SITE_OTHER): Payer: PPO

## 2018-09-25 DIAGNOSIS — E559 Vitamin D deficiency, unspecified: Secondary | ICD-10-CM

## 2018-09-25 DIAGNOSIS — E538 Deficiency of other specified B group vitamins: Secondary | ICD-10-CM | POA: Diagnosis not present

## 2018-09-25 DIAGNOSIS — I10 Essential (primary) hypertension: Secondary | ICD-10-CM

## 2018-09-25 DIAGNOSIS — E782 Mixed hyperlipidemia: Secondary | ICD-10-CM | POA: Diagnosis not present

## 2018-09-25 DIAGNOSIS — E11311 Type 2 diabetes mellitus with unspecified diabetic retinopathy with macular edema: Secondary | ICD-10-CM | POA: Diagnosis not present

## 2018-09-25 DIAGNOSIS — Z0000 Encounter for general adult medical examination without abnormal findings: Secondary | ICD-10-CM | POA: Diagnosis not present

## 2018-09-25 LAB — LIPID PANEL
Cholesterol: 140 mg/dL (ref 0–200)
HDL: 69 mg/dL (ref >39.00)
LDL Cholesterol: 58 mg/dL (ref 0–99)
NonHDL: 71.11
Total CHOL/HDL Ratio: 2
Triglycerides: 64 mg/dL (ref 0.0–149.0)
VLDL: 12.8 mg/dL (ref 0.0–40.0)

## 2018-09-25 LAB — COMPREHENSIVE METABOLIC PANEL
ALT: 12 U/L (ref 0–53)
AST: 24 U/L (ref 0–37)
Albumin: 3.7 g/dL (ref 3.5–5.2)
Alkaline Phosphatase: 61 U/L (ref 39–117)
BUN: 28 mg/dL — ABNORMAL HIGH (ref 6–23)
CO2: 24 mEq/L (ref 19–32)
Calcium: 9.5 mg/dL (ref 8.4–10.5)
Chloride: 102 mEq/L (ref 96–112)
Creatinine, Ser: 1.28 mg/dL (ref 0.40–1.50)
GFR: 54.09 mL/min — ABNORMAL LOW (ref >60.00)
Glucose, Bld: 104 mg/dL — ABNORMAL HIGH (ref 70–99)
Potassium: 5.1 mEq/L (ref 3.5–5.1)
Sodium: 136 mEq/L (ref 135–145)
Total Bilirubin: 0.5 mg/dL (ref 0.2–1.2)
Total Protein: 7.1 g/dL (ref 6.0–8.3)

## 2018-09-25 LAB — CBC WITH DIFFERENTIAL/PLATELET
Basophils Absolute: 0 10*3/uL (ref 0.0–0.1)
Basophils Relative: 0.8 % (ref 0.0–3.0)
Eosinophils Absolute: 0.1 10*3/uL (ref 0.0–0.7)
Eosinophils Relative: 2 % (ref 0.0–5.0)
HCT: 34.2 % — ABNORMAL LOW (ref 39.0–52.0)
Hemoglobin: 11.4 g/dL — ABNORMAL LOW (ref 13.0–17.0)
Lymphocytes Relative: 15.6 % (ref 12.0–46.0)
Lymphs Abs: 0.7 10*3/uL (ref 0.7–4.0)
MCHC: 33.4 g/dL (ref 30.0–36.0)
MCV: 95.8 fl (ref 78.0–100.0)
Monocytes Absolute: 0.5 10*3/uL (ref 0.1–1.0)
Monocytes Relative: 10.3 % (ref 3.0–12.0)
Neutro Abs: 3.3 10*3/uL (ref 1.4–7.7)
Neutrophils Relative %: 71.3 % (ref 43.0–77.0)
Platelets: 202 10*3/uL (ref 150.0–400.0)
RBC: 3.57 Mil/uL — ABNORMAL LOW (ref 4.22–5.81)
RDW: 14.8 % (ref 11.5–15.5)
WBC: 4.6 10*3/uL (ref 4.0–10.5)

## 2018-09-25 LAB — VITAMIN D 25 HYDROXY (VIT D DEFICIENCY, FRACTURES): VITD: 35.83 ng/mL (ref 30.00–100.00)

## 2018-09-25 LAB — HEMOGLOBIN A1C: Hgb A1c MFr Bld: 6.1 % (ref 4.6–6.5)

## 2018-09-25 LAB — VITAMIN B12: Vitamin B-12: 182 pg/mL — ABNORMAL LOW (ref 211–911)

## 2018-09-25 NOTE — Progress Notes (Signed)
PCP notes:   Health maintenance:  Eye exam - postponed  Abnormal screenings:   None  Patient concerns:   None  Nurse concerns:  None  Next PCP appt:   09/26/18 @ 2395

## 2018-09-25 NOTE — Patient Instructions (Signed)
[name] , Thank you for taking time to come for your Medicare Wellness Visit. I appreciate your ongoing commitment to your health goals. Please review the following plan we discussed and let me know if I can assist you in the future.   These are the goals we discussed: Goals    . Patient Stated     Starting 09/25/2018, I will continue to take medications as prescribed.        This is a list of the screening recommended for you and due dates:  Health Maintenance  Topic Date Due  . Eye exam for diabetics  05/22/2019*  . DTaP/Tdap/Td vaccine (1 - Tdap) 08/22/2021*  . Hemoglobin A1C  09/27/2018  . Flu Shot  12/21/2018  . Complete foot exam   03/30/2019  . Tetanus Vaccine  08/22/2021  . Pneumonia vaccines  Completed  *Topic was postponed. The date shown is not the original due date.   Preventive Care for Adults  A healthy lifestyle and preventive care can promote health and wellness. Preventive health guidelines for adults include the following key practices.  . A routine yearly physical is a good way to check with your health care provider about your health and preventive screening. It is a chance to share any concerns and updates on your health and to receive a thorough exam.  . Visit your dentist for a routine exam and preventive care every 6 months. Brush your teeth twice a day and floss once a day. Good oral hygiene prevents tooth decay and gum disease.  . The frequency of eye exams is based on your age, health, family medical history, use  of contact lenses, and other factors. Follow your health care provider's recommendations for frequency of eye exams.  . Eat a healthy diet. Foods like vegetables, fruits, whole grains, low-fat dairy products, and lean protein foods contain the nutrients you need without too many calories. Decrease your intake of foods high in solid fats, added sugars, and salt. Eat the right amount of calories for you. Get information about a proper diet from  your health care provider, if necessary.  . Regular physical exercise is one of the most important things you can do for your health. Most adults should get at least 150 minutes of moderate-intensity exercise (any activity that increases your heart rate and causes you to sweat) each week. In addition, most adults need muscle-strengthening exercises on 2 or more days a week.  Silver Sneakers may be a benefit available to you. To determine eligibility, you may visit the website: www.silversneakers.com or contact program at 917-822-0272 Mon-Fri between 8AM-8PM.   . Maintain a healthy weight. The body mass index (BMI) is a screening tool to identify possible weight problems. It provides an estimate of body fat based on height and weight. Your health care provider can find your BMI and can help you achieve or maintain a healthy weight.   For adults 20 years and older: ? A BMI below 18.5 is considered underweight. ? A BMI of 18.5 to 24.9 is normal. ? A BMI of 25 to 29.9 is considered overweight. ? A BMI of 30 and above is considered obese.   . Maintain normal blood lipids and cholesterol levels by exercising and minimizing your intake of saturated fat. Eat a balanced diet with plenty of fruit and vegetables. Blood tests for lipids and cholesterol should begin at age 25 and be repeated every 5 years. If your lipid or cholesterol levels are high, you are over  50, or you are at high risk for heart disease, you may need your cholesterol levels checked more frequently. Ongoing high lipid and cholesterol levels should be treated with medicines if diet and exercise are not working.  . If you smoke, find out from your health care provider how to quit. If you do not use tobacco, please do not start.  . If you choose to drink alcohol, please do not consume more than 2 drinks per day. One drink is considered to be 12 ounces (355 mL) of beer, 5 ounces (148 mL) of wine, or 1.5 ounces (44 mL) of liquor.  . If you  are 39-69 years old, ask your health care provider if you should take aspirin to prevent strokes.  . Use sunscreen. Apply sunscreen liberally and repeatedly throughout the day. You should seek shade when your shadow is shorter than you. Protect yourself by wearing long sleeves, pants, a wide-brimmed hat, and sunglasses year round, whenever you are outdoors.  . Once a month, do a whole body skin exam, using a mirror to look at the skin on your back. Tell your health care provider of new moles, moles that have irregular borders, moles that are larger than a pencil eraser, or moles that have changed in shape or color.

## 2018-09-25 NOTE — Progress Notes (Signed)
Subjective:   Bill [name] is a 78 y.o. male who presents for Medicare Annual/Subsequent preventive examination.  Review of Systems:  N/A Cardiac Risk Factors include: advanced age (>83mn, >>82women);diabetes mellitus;dyslipidemia;hypertension;male gender;obesity (BMI >30kg/m2)     Objective:    Vitals: There were no vitals taken for this visit.  There is no height or weight on file to calculate BMI.  Advanced Directives 09/25/2018 09/11/2017 09/07/2016 02/10/2016 01/28/2016 01/25/2016 01/05/2016  Does Patient Have a Medical Advance Directive? _0  Yes Yes  Type of AParamedicof AFredericktownLiving will HMerwinLiving will H[place]heurLiving will HBatesLiving will Living will;Healthcare Power of Attorney Living will Living will  Does patient want to make changes to medical advance directive? - No - Patient declined - No - Patient declined No - Patient declined No - Patient declined No - Patient declined  Copy of [place] Chart? Yes - validated most recent copy scanned in chart (See row information) Yes Yes Yes Yes No - copy requested No - copy requested  Pre-existing out of facility DNR order (yellow form or pink MOST form) - - - - - - -    Tobacco Social History   Tobacco Use  Smoking Status Never Smoker  Smokeless Tobacco Never Used     Counseling given: No   Clinical Intake:  Pre-visit preparation completed: Yes  Pain : No/denies pain Pain Score: 0-No pain     Nutritional Status: BMI > 30  Obese Nutritional Risks: None Diabetes: Yes CBG done?: No Did pt. bring in CBG monitor from home?: No  How often do you need to have someone help you when you read instructions, pamphlets, or other written materials from your doctor or pharmacy?: 1 - Never What is the last grade level you completed in school?: Bachelor  Interpreter Needed?: No  Comments: pt lives  with spouse Information entered by :: LPinson, LPN  Past Medical History:  Diagnosis Date  . Aortic stenosis 12/2010   s/p bioprosthetic AVR 9/17 // Echo 6/17: EF 55-60, mean AV 25 mmHg // TEE 6/17: severe AS, AVA 0.93, mean AV 43 mmHg // post op Echo 02/07/16: mod conc LVH, EF 65-70, vigorous LVF, AVR ok, mean 9 mmHg, MAC, trivial MR, mod LAE, trivial pericardial eff  . Chronic diastolic CHF (congestive heart failure) ([place]   . Coronary artery disease    LHC 8/17: pLAD 20, mLAD 30, dLAD 20, mRCA 30, dRCA 20, RPDA 20  . Exogenous obesity   . GERD (gastroesophageal reflux disease)    occasional  . History of chicken pox   . Hypertension   . Kidney stones remote  . Lyme disease 2012   ?titers negative  . Neuromuscular disorder (HCC)    tremor right arm  . Persistent atrial fibrillation    Tikosyn // s/p Maze procedure 9/17 // Anticoagulation undesirable 2/2 hx of retinal hemorrhage ([facility] at time of AVR 2/2 aortic stenosis)   . PFO (patent foramen ovale)    s/p PFO closure at time of AVR in 9/17  . S/P aortic valve replacement with bioprosthetic valve 01/27/2016   [facility]  . S/P Maze operation for atrial fibrillation 01/27/2016   Complete bilateral atrial lesion set using bipolar radiofrequency and cryothermy ablation with clipping of LA appendage  . Type 2 diabetes, controlled, with retinopathy ([place]  1980s  . Urge incontinence   . Vitamin B12 deficiency    IF normal (2014)   Past Surgical History:  Procedure Laterality Date  . AORTIC VALVE REPLACEMENT N/A 01/27/2016   Procedure: AORTIC VALVE REPLACEMENT (AVR);  Surgeon: Rexene Alberts, MD;  Location: [place];  Service: Open Heart Surgery;  Laterality: N/A;  . BACK SURGERY  20 yrs ago   removal of lower back cartilage due to damage. Patient does not remember date of surgery.  Marland Kitchen CARDIAC CATHETERIZATION N/A 01/05/2016   Procedure: Right/Left Heart Cath  and Coronary Angiography;  Surgeon: Burnell Blanks, MD;  Location: [place] CV LAB;  Service: Cardiovascular;  Laterality: N/A;  . CARDIOVERSION N/A 02/25/2015   Procedure: CARDIOVERSION;  Surgeon: Skeet Latch, MD;  Location: [facility] ENDOSCOPY;  Service: Cardiovascular;  Laterality: N/A;  . CATARACT EXTRACTION Bilateral 2015   [place] [facility] [place]  . COLONOSCOPY    . EYE SURGERY     left eye "cleaned"  . finger sx    . kidney stone removal    . MAZE N/A 01/27/2016   Procedure: MAZE;  Surgeon: Rexene Alberts, MD;  Location: [place];  Service: Open Heart Surgery;  Laterality: N/A;  . TEE WITHOUT CARDIOVERSION N/A 11/05/2015   Procedure: TRANSESOPHAGEAL ECHOCARDIOGRAM (TEE);  Surgeon: Pixie Casino, MD;  Location: [place];  Service: Cardiovascular;  Laterality: N/A;  . TEE WITHOUT CARDIOVERSION N/A 01/27/2016   Procedure: TRANSESOPHAGEAL ECHOCARDIOGRAM (TEE);  Surgeon: Rexene Alberts, MD;  Location: [place];  Service: Open Heart Surgery;  Laterality: N/A;  . TOTAL HIP ARTHROPLASTY  03/12/2012   RIGHT TOTAL HIP ARTHROPLASTY ANTERIOR APPROACH;  Surgeon: Mcarthur Rossetti, MD;  . US ECHOCARDIOGRAPHY  12/2010   EF 50-55%, mild LVH, normal wall motion, high LV filling pressures, mild AS, LA mildly dilated   Family History  Problem Relation Age of Onset  . Diabetes Mother   . Heart disease Mother   . Diabetes Father   . Heart disease Father   . Cancer Brother        throat   Social History   Socioeconomic History  . Marital status: Married    Spouse name: Not on file  . Number of children: Not on file  . Years of education: Not on file  . Highest education level: Not on file  Occupational History  . Not on file  Social Needs  . Financial resource strain: Not on file  . Food insecurity:    Worry: Not on file    Inability: Not on file  . Transportation needs:    Medical: Not on file    Non-medical: Not on file  Tobacco Use  . Smoking status: Never Smoker  .  Smokeless tobacco: Never Used  Substance and Sexual Activity  . Alcohol use: Yes    Comment: Regular (bourbon and coke 2-3/day)  . Drug use: No  . Sexual activity: Never  Lifestyle  . Physical activity:    Days per week: Not on file    Minutes per session: Not on file  . Stress: Not on file  Relationships  . Social connections:    Talks on phone: Not on file    Gets together: Not on file    Attends religious service: Not on file    Active member of club or organization: Not on file    Attends meetings of clubs or organizations: Not on file    Relationship status: Not on file  Other Topics Concern  .  Not on file  Social History Narrative   Caffeine: coffee 1 cup/day   Lives with wife [name]), 1 cat   Occupation: retired, worked for [employer]   Edu: BS   Activity: no regular exercise   Diet: some water, fruits/vegetables daily      Advanced directives: has living will at home. Does not want prolonged life support "no extraordinary measures" but would accept temporary measures. Would want wife to be HCPOA.     Outpatient Encounter Medications as of 09/25/2018  Medication Sig  . acetaminophen (TYLENOL) 325 MG tablet Take 650 mg by mouth every 4 (four) hours as needed for mild pain or fever.  . cholecalciferol (VITAMIN D) 1000 UNITS tablet Take 1,000 Units by mouth as directed.   Marland Kitchen glipiZIDE (GLUCOTROL) 5 MG tablet TAKE 0.5 TABLETS (2.5 MG TOTAL) BY MOUTH DAILY BEFORE BREAKFAST.  Marland Kitchen glucose blood (ONE TOUCH ULTRA TEST) test strip Use as directed to check sugar once daily.  Dx code:  E11.319  . lisinopril (PRINIVIL,ZESTRIL) 2.5 MG tablet TAKE 1 TABLET BY MOUTH EVERY DAY  . metFORMIN (GLUCOPHAGE) 500 MG tablet TAKE 1 TABLET (500 MG TOTAL) BY MOUTH 2 (TWO) TIMES DAILY WITH A MEAL.  . Multiple Vitamin (MULTIVITAMIN WITH MINERALS) TABS tablet Take by mouth daily. 1 packet of 5 vitamins daily  . multivitamin-lutein (OCUVITE-LUTEIN) CAPS capsule Take 3 capsules by mouth as directed.   Marland Kitchen  oxybutynin (DITROPAN) 5 MG tablet TAKE 1 TABLET (5 MG TOTAL) BY MOUTH 3 (THREE) TIMES DAILY.  . pravastatin (PRAVACHOL) 20 MG tablet TAKE 1 TABLET (20 MG TOTAL) BY MOUTH EVERY EVENING.  . vitamin B-12 (CYANOCOBALAMIN) 1000 MCG tablet Take 1,000 mcg by mouth as directed.   . warfarin (COUMADIN) 2.5 MG tablet TAKE 1 TABLET (2.5 MG TOTAL) BY MOUTH DAILY AT 6 PM. OR AS DIRECTED.   No facility-administered encounter medications on file as of 09/25/2018.     Activities of Daily Living In your present state of health, do you have any difficulty performing the following activities: 09/25/2018  Hearing? N  Vision? N  Difficulty concentrating or making decisions? N  Walking or climbing stairs? N  Dressing or bathing? N  Doing errands, shopping? N  Preparing Food and eating ? N  Using the Toilet? N  In the past six months, have you accidently leaked urine? N  Do you have problems with loss of bowel control? N  Managing your Medications? N  Managing your Finances? N  Housekeeping or managing your Housekeeping? N  Some recent data might be hidden    Patient Care Team: [name], MD as PCP - General (Family Medicine) [name], MD as PCP - Cardiology (Cardiology) Thelma Comp, [place] as Consulting Physician (Optometry) Evans Lance, MD as Consulting Physician (Cardiology) Sherlynn Stalls, MD as Consulting Physician (Ophthalmology) Malcolm Metro as Consulting Physician (Dentistry)   Assessment:   This is a routine wellness examination for Sadao.  Vision Screening Comments: Vision exam in 2019   Exercise Activities and Dietary recommendations Current Exercise Habits: Home exercise routine, Type of exercise: walking, Time (Minutes): 30, Frequency (Times/Week): 7, Weekly Exercise (Minutes/Week): 210, Intensity: Mild, Exercise limited by: None identified  Goals    . Patient Stated     Starting 09/25/2018, I will continue to take medications as prescribed.        Fall Risk  Fall Risk  09/25/2018 09/11/2017 09/07/2016 08/08/2016 02/21/2016  Falls in the past year? 0 No No No No  Number falls in past yr: - - - - -  Injury with Fall? - - - - -  Risk for fall due to : - - - Impaired balance/gait (No Data)  Risk for fall due to: Comment - - - - None   Depression Screen PHQ 2/9 Scores 09/25/2018 09/11/2017 11/10/2016 09/07/2016  PHQ - 2 Score 0 0 0 0  PHQ- 9 Score 0 0 - -    Cognitive Function MMSE - Mini Mental State Exam 09/25/2018 09/11/2017 09/07/2016 09/08/2015  Orientation to time _0 Orientation to Place _1 Registration _2 Attention/ Calculation 0 0 0 0  Recall _3 Language- name 2 objects 0 0 0 0  Language- repeat _4 Language- follow 3 step command 0 _5 Language- read & follow direction 0 0 0 0  Write a sentence 0 0 0 0  Copy design 0 0 0 0  Total score _6 PLEASE NOTE: A Mini-Cog screen was completed. Maximum score is 17. A value of 0 denotes this part of Folstein MMSE was not completed or the patient failed this part of the Mini-Cog screening.   Mini-Cog Screening Orientation to Time - Max 5 pts Orientation to Place - Max 5 pts Registration - Max 3 pts Recall - Max 3 pts Language Repeat - Max 1 pts      Immunization History  Administered Date(s) Administered  . H1N1 06/03/2008  . Influenza Split 03/16/2011, 03/01/2012  . Influenza Whole 04/23/2007, 06/09/2009, 02/02/2010  . Influenza,inj,Quad PF,6+ Mos 02/26/2013, 02/18/2014, 02/19/2015, 02/02/2016, 02/20/2017, 01/29/2018  . Pneumococcal Conjugate-13 09/03/2013  . Pneumococcal Polysaccharide-23 04/17/2006  . Td 08/23/2011  . Zoster 10/08/2013    Screening Tests Health Maintenance  Topic Date Due  . OPHTHALMOLOGY EXAM  05/22/2019 (Originally 08/21/2018)  . DTaP/Tdap/Td (1 - Tdap) 08/22/2021 (Originally 10/29/1959)  . HEMOGLOBIN A1C  09/27/2018  . INFLUENZA VACCINE  12/21/2018  . FOOT EXAM  03/30/2019  . TETANUS/TDAP  08/22/2021  . PNA vac Low Risk  Adult  Completed       Plan:     I have personally reviewed, addressed, and noted the following in the patient's chart:  A. Medical and social history B. Use of alcohol, tobacco or illicit drugs  C. Current medications and supplements D. Functional ability and status E.  Nutritional status F.  Physical activity G. Advance directives H. List of other physicians I.  Hospitalizations, surgeries, and ER visits in previous 12 months J.  Vitals (unless it is a telemedicine encounter) K. Screenings to include cognitive, depression, hearing, vision (NOTE: hearing and vision screenings not completed in telemedicine encounter) L. Referrals and appointments   In addition, I have reviewed and discussed with patient certain preventive protocols, quality metrics, and best practice recommendations. A written personalized care plan for preventive services and recommendations were provided to patient.  With patient's permission, we connected on 09/25/18 at 11:00 AM EDT by a video enabled telemedicine application. Two patient identifiers were used to ensure the encounter occurred with the correct person.    Patient was in home and writer was in office.   Signed,   Lindell Noe, MHA, BS, LPN Health Coach

## 2018-09-25 NOTE — Telephone Encounter (Signed)
Noted. Thanks.

## 2018-09-25 NOTE — Telephone Encounter (Signed)
CPE labs ordered 

## 2018-09-26 ENCOUNTER — Ambulatory Visit (INDEPENDENT_AMBULATORY_CARE_PROVIDER_SITE_OTHER): Payer: PPO | Admitting: Family Medicine

## 2018-09-26 ENCOUNTER — Encounter: Payer: Self-pay | Admitting: Family Medicine

## 2018-09-26 ENCOUNTER — Other Ambulatory Visit: Payer: Self-pay | Admitting: Family Medicine

## 2018-09-26 VITALS — BP 172/92 | HR 75 | Ht 72.0 in | Wt 215.0 lb

## 2018-09-26 DIAGNOSIS — M1612 Unilateral primary osteoarthritis, left hip: Secondary | ICD-10-CM | POA: Diagnosis not present

## 2018-09-26 DIAGNOSIS — I4891 Unspecified atrial fibrillation: Secondary | ICD-10-CM | POA: Diagnosis not present

## 2018-09-26 DIAGNOSIS — E875 Hyperkalemia: Secondary | ICD-10-CM

## 2018-09-26 DIAGNOSIS — E782 Mixed hyperlipidemia: Secondary | ICD-10-CM | POA: Diagnosis not present

## 2018-09-26 DIAGNOSIS — N183 Chronic kidney disease, stage 3 unspecified: Secondary | ICD-10-CM

## 2018-09-26 DIAGNOSIS — E1142 Type 2 diabetes mellitus with diabetic polyneuropathy: Secondary | ICD-10-CM

## 2018-09-26 DIAGNOSIS — Z8679 Personal history of other diseases of the circulatory system: Secondary | ICD-10-CM

## 2018-09-26 DIAGNOSIS — M545 Low back pain, unspecified: Secondary | ICD-10-CM

## 2018-09-26 DIAGNOSIS — E1122 Type 2 diabetes mellitus with diabetic chronic kidney disease: Secondary | ICD-10-CM | POA: Diagnosis not present

## 2018-09-26 DIAGNOSIS — I5032 Chronic diastolic (congestive) heart failure: Secondary | ICD-10-CM

## 2018-09-26 DIAGNOSIS — G8929 Other chronic pain: Secondary | ICD-10-CM

## 2018-09-26 DIAGNOSIS — I10 Essential (primary) hypertension: Secondary | ICD-10-CM

## 2018-09-26 DIAGNOSIS — E559 Vitamin D deficiency, unspecified: Secondary | ICD-10-CM | POA: Diagnosis not present

## 2018-09-26 DIAGNOSIS — M25562 Pain in left knee: Secondary | ICD-10-CM

## 2018-09-26 DIAGNOSIS — Z953 Presence of xenogenic heart valve: Secondary | ICD-10-CM

## 2018-09-26 DIAGNOSIS — I771 Stricture of artery: Secondary | ICD-10-CM

## 2018-09-26 DIAGNOSIS — E538 Deficiency of other specified B group vitamins: Secondary | ICD-10-CM

## 2018-09-26 DIAGNOSIS — E1151 Type 2 diabetes mellitus with diabetic peripheral angiopathy without gangrene: Secondary | ICD-10-CM

## 2018-09-26 DIAGNOSIS — N3941 Urge incontinence: Secondary | ICD-10-CM | POA: Diagnosis not present

## 2018-09-26 DIAGNOSIS — E11311 Type 2 diabetes mellitus with unspecified diabetic retinopathy with macular edema: Secondary | ICD-10-CM

## 2018-09-26 DIAGNOSIS — Z9889 Other specified postprocedural states: Secondary | ICD-10-CM

## 2018-09-26 NOTE — Assessment & Plan Note (Signed)
Chronic, mild deterioration this year. Reviewed with patient.

## 2018-09-26 NOTE — Assessment & Plan Note (Signed)
Appreciate cards care. Followed by coumadin clinic.

## 2018-09-26 NOTE — Assessment & Plan Note (Addendum)
Elevated readings. H/o hyperkalemia to ACEI so will not increase. Discrepancy in arms R>L (30 mmHg higher). Discussed concern with PVD causing (subclavian steal/stenosis). Will start evaluation with carotid US. Pt agrees with plan.

## 2018-09-26 NOTE — Assessment & Plan Note (Signed)
Ongoing, but feels stable on oxybutynin tid. Continue. Declines further eval/treatment. Seems to be tolerating anticholinergic well.

## 2018-09-26 NOTE — Assessment & Plan Note (Signed)
Chronic, stable on current regimen. Continue.  

## 2018-09-26 NOTE — Assessment & Plan Note (Signed)
Update carotid US. H/o LLE pulses without claudication. Previously declined further arterial evaluation.

## 2018-09-26 NOTE — Assessment & Plan Note (Signed)
Continue vit D 1000 IU daily replacement.

## 2018-09-26 NOTE — Patient Instructions (Addendum)
We will sign you up for cologuard testing again.  If interested, check with pharmacy about new 2 shot shingles series (shingrix).  You did have pressure difference between both arms today. I will order carotid ultrasound for further evaluation of this.  Restart vitamin B12 101mcg daily over the counter, dissolvable tablets if you can find it.  Good to see you today. You are doing well today Return as needed or in 6 months for diabetes follow up visit.

## 2018-09-26 NOTE — Assessment & Plan Note (Signed)
Continues tylenol 650mg  BID.

## 2018-09-26 NOTE — Assessment & Plan Note (Signed)
Anticipate OA related flares. Supportive care reviewed (rest, elevation, tylenol, ice, knee brace). rec OV if ongoing

## 2018-09-26 NOTE — Assessment & Plan Note (Signed)
Continue low dose ACEI.  Consider zirconium cyclosilicate or patriomer if chronic hypokalemic regimen needed.

## 2018-09-26 NOTE — Assessment & Plan Note (Signed)
Levels again low - has only been taking 266mcg total B12 daily. Will start 1015mcg dissolvable tablet. Anticipate metformin contributing to deficiency and possibly developing anemia due to deficiency.

## 2018-09-26 NOTE — Progress Notes (Signed)
Virtual visit completed through [employer]. Due to national recommendations of social distancing due to [place] 19, a virtual visit is felt to be most appropriate for this patient at this time.   Patient location: home Provider location: [place] at [facility] [place], office If any vitals were documented, they were collected by patient at home unless specified below.    BP (!) 172/92 (BP Location: Right Arm, Cuff Size: Large)   Pulse 75   Ht 6' (1.829 m)   Wt 215 lb (97.5 kg)   BMI 29.16 kg/m    CC: AMW f/u visit Subjective:    Patient ID: Bill [name], male    DOB: Aug 13, 1940, 78 y.o.   MRN: [id number]  HPI: Bill [name] is a 78 y.o. male presenting on 09/26/2018 for Annual Exam (Pt 2. )   Saw Katha Cabal yesterday for medicare wellness visit. Note reviewed.    Regularly sees Dr Lovena Le and Dr [name] s/p bioprosthetic AVR, diastolic CHF, persistent afib despite MAZE procedure and LAA ligation.   B12 deficiency - low levels on latest check despite daily vit b12 replacement (was only taking 2108mcg after checking on multivitamin).   Ongoing knee pain present for 3-4 months, worse with prolonged walking > 1 mile. Describes posterior knee joint pain. No knee or leg swelling. Manages with tylenol 650mg  BID (also used for lower back pain).   Preventative: Colonoscopy 2002, told normal. Cologuard 09/2015 normal. Prostate screening - always normal. Denies nocturia, hesitancy or changes in stream.Agedout.  Flu shot yearly  Pneumonia shot 2007. Prevnar 08/2013 Td 2013  zostavax - 09/2013.  Shingrix - discussed.  Advanced directives:scanned into chart 12/2015 - HCPOA are [place] then [place]. Does not want life prolonging measures if prolonged unconscious. Seat belt use discussed Sunscreen use discussed. No changing moles on skin. Sees derm Allyson Sabal). Non smoker  Alcohol - 2-3 mixed drinks a night  Sees eye doctor yearly - upcoming appt 10/01/2018 Dentist - Q6 months  Bowels- no  constipation Bladder- occasional urge incontinence on oxybutynin 5mg  TID. No dysuria or stress incontinence symptoms. Oxybutynin XR wasn't as effective. Declines further treatment.   Caffeine: coffee 1 cup/day Lives with wife [name]), 1 cat Occupation: retired, worked for [employer]: BS Activity: no regular exercise, gardening Diet: some water, fruits/vegetables daily     Relevant past medical, surgical, family and social history reviewed and updated as indicated. Interim medical history since our last visit reviewed. Allergies and medications reviewed and updated. Outpatient Medications Prior to Visit  Medication Sig Dispense Refill  . acetaminophen (TYLENOL) 325 MG tablet Take 650 mg by mouth every 4 (four) hours as needed for mild pain or fever.    . cholecalciferol (VITAMIN D) 1000 UNITS tablet Take 1,000 Units by mouth as directed.     Marland Kitchen glipiZIDE (GLUCOTROL) 5 MG tablet TAKE 0.5 TABLETS (2.5 MG TOTAL) BY MOUTH DAILY BEFORE BREAKFAST. 45 tablet 0  . glucose blood (ONE TOUCH ULTRA TEST) test strip Use as directed to check sugar once daily.  Dx code:  E11.319 100 each 0  . lisinopril (PRINIVIL,ZESTRIL) 2.5 MG tablet TAKE 1 TABLET BY MOUTH EVERY DAY 90 tablet 0  . metFORMIN (GLUCOPHAGE) 500 MG tablet TAKE 1 TABLET (500 MG TOTAL) BY MOUTH 2 (TWO) TIMES DAILY WITH A MEAL. 180 tablet 1  . Multiple Vitamin (MULTIVITAMIN WITH MINERALS) TABS tablet Take by mouth daily. 1 packet of 5 vitamins daily    . multivitamin-lutein (OCUVITE-LUTEIN) CAPS capsule Take 3 capsules by mouth as  directed.     Marland Kitchen oxybutynin (DITROPAN) 5 MG tablet TAKE 1 TABLET (5 MG TOTAL) BY MOUTH 3 (THREE) TIMES DAILY. 270 tablet 2  . pravastatin (PRAVACHOL) 20 MG tablet TAKE 1 TABLET (20 MG TOTAL) BY MOUTH EVERY EVENING. 90 tablet 0  . vitamin B-12 (CYANOCOBALAMIN) 1000 MCG tablet Take 1,000 mcg by mouth as directed.     . warfarin (COUMADIN) 2.5 MG tablet TAKE 1 TABLET (2.5 MG TOTAL) BY MOUTH DAILY AT 6 PM. OR AS DIRECTED. 90  tablet 1   No facility-administered medications prior to visit.      Per HPI unless specifically indicated in ROS section below Review of Systems Objective:    BP (!) 172/92 (BP Location: Right Arm, Cuff Size: Large)   Pulse 75   Ht 6' (1.829 m)   Wt 215 lb (97.5 kg)   BMI 29.16 kg/m   Wt Readings from Last 3 Encounters:  09/26/18 215 lb (97.5 kg)  04/10/18 230 lb (104.3 kg)  03/29/18 232 lb 12 oz (105.6 kg)     Physical exam: Gen: alert, NAD, not ill appearing Pulm: speaks in complete sentences without increased work of breathing Psych: normal mood, normal thought content      Results for orders placed or performed in visit on 09/25/18  Comprehensive metabolic panel  Result Value Ref Range   Sodium 136 135 - 145 mEq/L   Potassium 5.1 3.5 - 5.1 mEq/L   Chloride 102 96 - 112 mEq/L   CO2 24 19 - 32 mEq/L   Glucose, Bld 104 (H) 70 - 99 mg/dL   BUN 28 (H) 6 - 23 mg/dL   Creatinine, Ser 1.28 0.40 - 1.50 mg/dL   Total Bilirubin 0.5 0.2 - 1.2 mg/dL   Alkaline Phosphatase 61 39 - 117 U/L   AST 24 0 - 37 U/L   ALT 12 0 - 53 U/L   Total Protein 7.1 6.0 - 8.3 g/dL   Albumin 3.7 3.5 - 5.2 g/dL   Calcium 9.5 8.4 - 10.5 mg/dL   GFR 54.09 (L) >60.00 mL/min  CBC with Differential/Platelet  Result Value Ref Range   WBC 4.6 4.0 - 10.5 K/uL   RBC 3.57 (L) 4.22 - 5.81 Mil/uL   Hemoglobin 11.4 (L) 13.0 - 17.0 g/dL   HCT 34.2 (L) 39.0 - 52.0 %   MCV 95.8 78.0 - 100.0 fl   MCHC 33.4 30.0 - 36.0 g/dL   RDW 14.8 11.5 - 15.5 %   Platelets 202.0 150.0 - 400.0 K/uL   Neutrophils Relative % 71.3 43.0 - 77.0 %   Lymphocytes Relative 15.6 12.0 - 46.0 %   Monocytes Relative 10.3 3.0 - 12.0 %   Eosinophils Relative 2.0 0.0 - 5.0 %   Basophils Relative 0.8 0.0 - 3.0 %   Neutro Abs 3.3 1.4 - 7.7 K/uL   Lymphs Abs 0.7 0.7 - 4.0 K/uL   Monocytes Absolute 0.5 0.1 - 1.0 K/uL   Eosinophils Absolute 0.1 0.0 - 0.7 K/uL   Basophils Absolute 0.0 0.0 - 0.1 K/uL  Vitamin B12  Result Value Ref  Range   Vitamin B-12 182 (L) 211 - 911 pg/mL  Vitamin D, 25-hydroxy  Result Value Ref Range   VITD 35.83 30.00 - 100.00 ng/mL  Lipid Panel  Result Value Ref Range   Cholesterol 140 0 - 200 mg/dL   Triglycerides 64.0 0.0 - 149.0 mg/dL   HDL 69.00 >39.00 mg/dL   VLDL 12.8 0.0 - 40.0 mg/dL  LDL Cholesterol 58 0 - 99 mg/dL   Total CHOL/HDL Ratio 2    NonHDL 71.11   Hemoglobin A1c  Result Value Ref Range   Hgb A1c MFr Bld 6.1 4.6 - 6.5 %   Assessment & Plan:   Problem List Items Addressed This Visit    Vitamin D deficiency (Chronic)    Continue vit D 1000 IU daily replacement.      Vitamin B12 deficiency    Levels again low - has only been taking 254mcg total B12 daily. Will start 1064mcg dissolvable tablet. Anticipate metformin contributing to deficiency and possibly developing anemia due to deficiency.       Urge incontinence    Ongoing, but feels stable on oxybutynin tid. Continue. Declines further eval/treatment. Seems to be tolerating anticholinergic well.       Type 2 diabetes, controlled, with retinopathy ([place]) - Primary (Chronic)    Chronic, stable on current regimen. Continue.       S/P Maze operation for atrial fibrillation   S/P aortic valve replacement with bioprosthetic valve + maze procedure   Left knee pain    Anticipate OA related flares. Supportive care reviewed (rest, elevation, tylenol, ice, knee brace). rec OV if ongoing      Hyperkalemia    Continue low dose ACEI.  Consider zirconium cyclosilicate or patriomer if chronic hypokalemic regimen needed.       HLD (hyperlipidemia) (Chronic)    Chronic, stable on pravastatin.  The 10-year ASCVD risk score Mikey Bussing DC Brooke Bonito., et al., 2013) is: 71.3%   Values used to calculate the score:     Age: 28 years     Sex: Male     Is Non-Hispanic African American: No     Diabetic: Yes     Tobacco smoker: No     Systolic Blood Pressure: 952 mmHg     Is BP treated: Yes     HDL Cholesterol: 69 mg/dL     Total  Cholesterol: 140 mg/dL       Essential hypertension (Chronic)    Elevated readings. H/o hyperkalemia to ACEI so will not increase. Discrepancy in arms R>L (30 mmHg higher). Discussed concern with PVD causing (subclavian steal/stenosis). Will start evaluation with carotid US. Pt agrees with plan.       Relevant Orders   VAS US CAROTID   Diabetic peripheral vascular disease ([place])    Update carotid US. H/o LLE pulses without claudication. Previously declined further arterial evaluation.      Relevant Orders   VAS US CAROTID   Diabetic peripheral neuropathy associated with type 2 diabetes mellitus (HCC)   Degenerative arthritis of hip (Chronic)   CKD stage 3 due to type 2 diabetes mellitus (HCC)    Chronic, mild deterioration this year. Reviewed with patient.       Chronic lower back pain (Chronic)    Continues tylenol 650mg  BID.       Chronic diastolic heart failure (HCC)   Atrial fibrillation (HCC) (Chronic)    Appreciate cards care. Followed by coumadin clinic.           No orders of the defined types were placed in this encounter.  No orders of the defined types were placed in this encounter.  Patient Instructions  We will sign you up for cologuard testing again.  If interested, check with pharmacy about new 2 shot shingles series (shingrix).  You did have pressure difference between both arms today. I will order carotid ultrasound for further evaluation of  this.  Restart vitamin B12 1068mcg daily over the counter, dissolvable tablets if you can find it.  Good to see you today. You are doing well today Return as needed or in 6 months for diabetes follow up visit.   Follow up plan: Return in about 6 months (around 03/29/2019) for follow up visit.  [name], MD

## 2018-09-26 NOTE — Assessment & Plan Note (Signed)
Chronic, stable on pravastatin.  The 10-year ASCVD risk score Mikey Bussing DC Brooke Bonito., et al., 2013) is: 71.3%   Values used to calculate the score:     Age: 78 years     Sex: Male     Is Non-Hispanic African American: No     Diabetic: Yes     Tobacco smoker: No     Systolic Blood Pressure: 795 mmHg     Is BP treated: Yes     HDL Cholesterol: 69 mg/dL     Total Cholesterol: 140 mg/dL

## 2018-09-30 ENCOUNTER — Ambulatory Visit (HOSPITAL_COMMUNITY)
Admission: RE | Admit: 2018-09-30 | Discharge: 2018-09-30 | Disposition: A | Discharge status: Home / Self Care | Payer: PPO | Source: Ambulatory Visit | Attending: Internal Medicine | Admitting: Internal Medicine

## 2018-09-30 ENCOUNTER — Other Ambulatory Visit: Payer: Self-pay

## 2018-09-30 DIAGNOSIS — I771 Stricture of artery: Secondary | ICD-10-CM | POA: Diagnosis not present

## 2018-10-01 ENCOUNTER — Encounter: Payer: Self-pay | Admitting: Family Medicine

## 2018-10-01 DIAGNOSIS — H40023 Open angle with borderline findings, high risk, bilateral: Secondary | ICD-10-CM | POA: Diagnosis not present

## 2018-10-01 DIAGNOSIS — Z9841 Cataract extraction status, right eye: Secondary | ICD-10-CM | POA: Diagnosis not present

## 2018-10-01 DIAGNOSIS — E113553 Type 2 diabetes mellitus with stable proliferative diabetic retinopathy, bilateral: Secondary | ICD-10-CM | POA: Diagnosis not present

## 2018-10-01 DIAGNOSIS — Z9842 Cataract extraction status, left eye: Secondary | ICD-10-CM | POA: Diagnosis not present

## 2018-10-01 LAB — HM DIABETES EYE EXAM

## 2018-10-02 ENCOUNTER — Encounter: Payer: Self-pay | Admitting: Family Medicine

## 2018-10-05 ENCOUNTER — Encounter: Payer: Self-pay | Admitting: Family Medicine

## 2018-10-12 ENCOUNTER — Other Ambulatory Visit: Payer: Self-pay | Admitting: Family Medicine

## 2018-10-17 ENCOUNTER — Ambulatory Visit (INDEPENDENT_AMBULATORY_CARE_PROVIDER_SITE_OTHER): Payer: PPO | Admitting: General Practice

## 2018-10-17 ENCOUNTER — Other Ambulatory Visit (INDEPENDENT_AMBULATORY_CARE_PROVIDER_SITE_OTHER): Payer: PPO

## 2018-10-17 DIAGNOSIS — Z7901 Long term (current) use of anticoagulants: Secondary | ICD-10-CM

## 2018-10-17 DIAGNOSIS — I4891 Unspecified atrial fibrillation: Secondary | ICD-10-CM

## 2018-10-17 LAB — POCT INR: INR: 4 — AB (ref 2.0–3.0)

## 2018-10-17 NOTE — Patient Instructions (Signed)
Pre visit review using our clinic review tool, if applicable. No additional management support is needed unless otherwise documented below in the visit note.  Hold coumadin today and tomorrow and then continue to take 1 tablet daily except 1/2 tablet on Sunday and Wednesdays.  Re-check in 3 weeks.

## 2018-10-18 ENCOUNTER — Other Ambulatory Visit: Payer: Self-pay | Admitting: Family Medicine

## 2018-10-18 NOTE — Telephone Encounter (Signed)
Patient is compliant with coumadin management.  Will refill X 6 months.   

## 2018-10-22 DIAGNOSIS — Z1212 Encounter for screening for malignant neoplasm of rectum: Secondary | ICD-10-CM | POA: Diagnosis not present

## 2018-10-22 DIAGNOSIS — Z1211 Encounter for screening for malignant neoplasm of colon: Secondary | ICD-10-CM | POA: Diagnosis not present

## 2018-10-22 LAB — COLOGUARD: Cologuard: NEGATIVE

## 2018-10-24 ENCOUNTER — Encounter: Payer: Self-pay | Admitting: Family Medicine

## 2018-10-25 ENCOUNTER — Other Ambulatory Visit: Payer: Self-pay | Admitting: Family Medicine

## 2018-11-04 ENCOUNTER — Encounter: Payer: Self-pay | Admitting: Family Medicine

## 2018-11-04 ENCOUNTER — Telehealth: Payer: Self-pay

## 2018-11-04 NOTE — Telephone Encounter (Signed)
Spoke with pt notifying him the Cologuard result is negative per Dr. Darnell Level.  Pt verbalizes understanding.

## 2018-11-07 ENCOUNTER — Other Ambulatory Visit: Payer: Self-pay

## 2018-11-07 ENCOUNTER — Other Ambulatory Visit: Payer: PPO

## 2018-11-07 ENCOUNTER — Ambulatory Visit (INDEPENDENT_AMBULATORY_CARE_PROVIDER_SITE_OTHER): Payer: PPO | Admitting: General Practice

## 2018-11-07 ENCOUNTER — Other Ambulatory Visit: Payer: Self-pay | Admitting: Family Medicine

## 2018-11-07 DIAGNOSIS — E785 Hyperlipidemia, unspecified: Secondary | ICD-10-CM

## 2018-11-07 DIAGNOSIS — I2510 Atherosclerotic heart disease of native coronary artery without angina pectoris: Secondary | ICD-10-CM

## 2018-11-07 DIAGNOSIS — Z7901 Long term (current) use of anticoagulants: Secondary | ICD-10-CM | POA: Diagnosis not present

## 2018-11-07 LAB — POCT INR: INR: 4.4 — AB (ref 2.0–3.0)

## 2018-11-07 NOTE — Patient Instructions (Addendum)
Pre visit review using our clinic review tool, if applicable. No additional management support is needed unless otherwise documented below in the visit note.  Hold coumadin today and tomorrow(6/18 and 6/19) and then change dosage and take 1/2 daily except 1 tablet on Mon Thurs and Sat. Re-check in 3 weeks.

## 2018-11-21 ENCOUNTER — Telehealth: Payer: Self-pay

## 2018-11-21 ENCOUNTER — Encounter (HOSPITAL_COMMUNITY): Payer: Self-pay | Admitting: Emergency Medicine

## 2018-11-21 ENCOUNTER — Ambulatory Visit: Payer: PPO | Admitting: Family Medicine

## 2018-11-21 ENCOUNTER — Other Ambulatory Visit: Payer: Self-pay

## 2018-11-21 ENCOUNTER — Ambulatory Visit (HOSPITAL_COMMUNITY)
Admission: EM | Admit: 2018-11-21 | Discharge: 2018-11-21 | Disposition: A | Discharge status: Home / Self Care | Payer: PPO | Attending: Family Medicine | Admitting: Family Medicine

## 2018-11-21 DIAGNOSIS — S0181XA Laceration without foreign body of other part of head, initial encounter: Secondary | ICD-10-CM | POA: Diagnosis not present

## 2018-11-21 DIAGNOSIS — W19XXXA Unspecified fall, initial encounter: Secondary | ICD-10-CM

## 2018-11-21 NOTE — Telephone Encounter (Signed)
Noted. Agree with [facility] eval.  

## 2018-11-21 NOTE — ED Triage Notes (Signed)
Patient fell last night, tripped while walking dog.  No loc.  Laceration above right eye, bleeding controlled.  Patient also has skin tears to both elbows.  Abrasion to cheek bone/under right eye.  Denies vision changes.

## 2018-11-21 NOTE — Telephone Encounter (Signed)
Bill [name] said that pt hit his head; pt said was going to walk dog and leaned to much forward to get leash and fell and hit head. Pt is going to [facility] now and pt said he is OK to drive himself. FYI to Dr Darnell Level.

## 2018-11-21 NOTE — Discharge Instructions (Addendum)
Put a light coat of antibiotic ointment on the wounds 2 to 3 times a day Watch for signs of infection Sutures need to come out in 5 to 7 days Go home with an ice pack on the sutured wound for 20 minutes Follow-up with your primary care doctor.  You may return here for suture removal. If you develop headache, dizziness, blurred vision, trouble with balance go directly to the hospital

## 2018-11-21 NOTE — ED Notes (Addendum)
Patient fell last night walking dog, tripped.  Patient has a laceration above eye.  bleeding controlled. Dr Lanny Cramp spoke to patient.  Patient is staying

## 2018-11-21 NOTE — ED Provider Notes (Signed)
[facility]    CSN: [id number] Arrival date & time: 11/21/18  1005      History   Chief Complaint Chief Complaint  Patient presents with  . Fall  . Laceration    HPI Bill [name] is a 78 y.o. male.   HPI   78 year old patient.  He states last evening he leaned over to pick up a leash in order to walk his dog, and he lost his balance fell forward.  Thinks his glasses pressed into his face and cut above and below his right eye.  He states there is been a lot of bleeding.  He states it bled throughout the night.  He is here to have it checked this morning. He had no loss of consciousness.  No headache.  No change in vision.  No change in mentation. Patient is on chronic Coumadin anticoagulation because of a aortic valve replacement.  He checks his INR regularly.  He understands this is why he had such bleeding from his scalp wound. He was informed that patients, one older, and on Coumadin have a higher risk of brain bleeds.  It is recommended he go to the emergency department for evaluation and likely CAT scan.  Patient declines.  Past Medical History:  Diagnosis Date  . Aortic stenosis 12/2010   s/p bioprosthetic AVR 9/17 // Echo 6/17: EF 55-60, mean AV 25 mmHg // TEE 6/17: severe AS, AVA 0.93, mean AV 43 mmHg // post op Echo 02/07/16: mod conc LVH, EF 65-70, vigorous LVF, AVR ok, mean 9 mmHg, MAC, trivial MR, mod LAE, trivial pericardial eff  . Chronic diastolic CHF (congestive heart failure) ([place])   . Coronary artery disease    LHC 8/17: pLAD 20, mLAD 30, dLAD 20, mRCA 30, dRCA 20, RPDA 20  . Exogenous obesity   . GERD (gastroesophageal reflux disease)    occasional  . History of chicken pox   . Hypertension   . Kidney stones remote  . Lyme disease 2012   ?titers negative  . Neuromuscular disorder (HCC)    tremor right arm  . Persistent atrial fibrillation    Tikosyn // s/p Maze procedure 9/17 // Anticoagulation undesirable 2/2 hx of retinal hemorrhage (worked  up for [employer] but referred for Maze at time of AVR 2/2 aortic stenosis)   . PFO (patent foramen ovale)    s/p PFO closure at time of AVR in 9/17  . S/P aortic valve replacement with bioprosthetic valve 01/27/2016   27 mm [facility] Ease bovine pericardial tissue valve  . S/P Maze operation for atrial fibrillation 01/27/2016   Complete bilateral atrial lesion set using bipolar radiofrequency and cryothermy ablation with clipping of LA appendage  . Type 2 diabetes, controlled, with retinopathy ([place]) 1980s  . Urge incontinence   . Vitamin B12 deficiency    IF normal (2014)    Patient Active Problem List   Diagnosis Date Noted  . Left knee pain 09/26/2018  . Long term (current) use of anticoagulants 05/17/2017  . Diabetic peripheral vascular disease ([place]) 09/14/2016  . Coronary artery disease involving native coronary artery of native heart without angina pectoris 09/14/2016  . Dysphagia 03/13/2016  . Spider vein of left lower extremity 03/13/2016  . Encounter for therapeutic drug monitoring 02/07/2016  . S/P aortic valve replacement with bioprosthetic valve + maze procedure 01/27/2016  . S/P Maze operation for atrial fibrillation 01/27/2016  . Severe aortic stenosis   . Health maintenance examination 09/14/2015  . Diabetic peripheral  neuropathy associated with type 2 diabetes mellitus ([place]) 02/19/2015  . Advanced care planning/counseling discussion 09/15/2014  . Resting tremor 09/15/2014  . Hyperkalemia 09/15/2014  . Perichondritis of left ear 02/26/2014  . Chronic diastolic heart failure ([place]) 10/16/2013  . CKD stage 3 due to type 2 diabetes mellitus ([place]) 05/15/2013  . Metatarsalgia of right foot 05/09/2013  . Sleep disorder 09/06/2012  . Vitamin B12 deficiency 09/05/2012  . Degenerative arthritis of hip 03/12/2012  . Atrial fibrillation ([place]) 03/11/2012  . Chronic lower back pain 08/23/2011  . Urge incontinence   . Vitamin D deficiency 06/03/2008  . Obesity, Class I, BMI  30-34.9 06/03/2008  . False positive serological test for syphilis 06/03/2008  . BENIGN PROSTATIC HYPERTROPHY, HX OF 06/03/2008  . Controlled type 2 diabetes mellitus with proliferative retinopathy ([place]) 04/23/2007  . HLD (hyperlipidemia) 04/23/2007  . Essential hypertension 12/03/2006    Past Surgical History:  Procedure Laterality Date  . AORTIC VALVE REPLACEMENT N/A 01/27/2016   Procedure: AORTIC VALVE REPLACEMENT (AVR);  Surgeon: Rexene Alberts, MD;  Location: [place];  Service: Open Heart Surgery;  Laterality: N/A;  . BACK SURGERY  20 yrs ago   removal of lower back cartilage due to damage. Patient does not remember date of surgery.  Marland Kitchen CARDIAC CATHETERIZATION N/A 01/05/2016   Procedure: Right/Left Heart Cath and Coronary Angiography;  Surgeon: Burnell Blanks, MD;  Location: [place] CV LAB;  Service: Cardiovascular;  Laterality: N/A;  . CARDIOVERSION N/A 02/25/2015   Procedure: CARDIOVERSION;  Surgeon: Skeet Latch, MD;  Location: [facility] ENDOSCOPY;  Service: Cardiovascular;  Laterality: N/A;  . CATARACT EXTRACTION Bilateral 2015   [facility]  . COLONOSCOPY    . EYE SURGERY     left eye "cleaned"  . finger sx    . kidney stone removal    . MAZE N/A 01/27/2016   Procedure: MAZE;  Surgeon: Rexene Alberts, MD;  Location: [place];  Service: Open Heart Surgery;  Laterality: N/A;  . TEE WITHOUT CARDIOVERSION N/A 11/05/2015   Procedure: TRANSESOPHAGEAL ECHOCARDIOGRAM (TEE);  Surgeon: Pixie Casino, MD;  Location: [place];  Service: Cardiovascular;  Laterality: N/A;  . TEE WITHOUT CARDIOVERSION N/A 01/27/2016   Procedure: TRANSESOPHAGEAL ECHOCARDIOGRAM (TEE);  Surgeon: Rexene Alberts, MD;  Location: [place];  Service: Open Heart Surgery;  Laterality: N/A;  . TOTAL HIP ARTHROPLASTY  03/12/2012   RIGHT TOTAL HIP ARTHROPLASTY ANTERIOR APPROACH;  Surgeon: Mcarthur Rossetti, MD;  . US ECHOCARDIOGRAPHY  12/2010   EF 50-55%, mild LVH, normal wall motion, high LV filling  pressures, mild AS, LA mildly dilated       Home Medications    Prior to Admission medications   Medication Sig Start Date End Date Taking? Authorizing Provider  acetaminophen (TYLENOL) 325 MG tablet Take 650 mg by mouth every 4 (four) hours as needed for mild pain or fever.    [provider]  cholecalciferol (VITAMIN D) 1000 UNITS tablet Take 1,000 Units by mouth as directed.     [provider]  glipiZIDE (GLUCOTROL) 5 MG tablet TAKE 0.5 TABLETS (2.5 MG TOTAL) BY MOUTH DAILY BEFORE BREAKFAST. 09/13/18   [name], MD  glucose blood (ONE TOUCH ULTRA TEST) test strip Use as directed to check sugar once daily.  Dx code:  E11.319 07/25/18   [name], MD  lisinopril (ZESTRIL) 2.5 MG tablet TAKE 1 TABLET BY MOUTH EVERY DAY 10/15/18   [name], MD  metFORMIN (GLUCOPHAGE) 500 MG tablet TAKE 1  TABLET (500 MG TOTAL) BY MOUTH 2 (TWO) TIMES DAILY WITH A MEAL. 10/26/18   [name], MD  Multiple Vitamin (MULTIVITAMIN WITH MINERALS) TABS tablet Take by mouth daily. 1 packet of 5 vitamins daily    [provider]  multivitamin-lutein (OCUVITE-LUTEIN) CAPS capsule Take 3 capsules by mouth as directed.     [provider]  oxybutynin (DITROPAN) 5 MG tablet TAKE 1 TABLET (5 MG TOTAL) BY MOUTH 3 (THREE) TIMES DAILY. 01/10/18   [name], MD  pravastatin (PRAVACHOL) 20 MG tablet TAKE 1 TABLET BY MOUTH EVERY DAY IN THE EVENING 11/08/18   [name], MD  vitamin B-12 (CYANOCOBALAMIN) 1000 MCG tablet Take 1,000 mcg by mouth as directed.     [provider]  warfarin (COUMADIN) 2.5 MG tablet TAKE 1 TABLET (2.5 MG TOTAL) BY MOUTH DAILY AT 6 PM. OR AS DIRECTED. 10/18/18   [name], MD    Family History Family History  Problem Relation Age of Onset  . Diabetes Mother   . Heart disease Mother   . Diabetes Father   . Heart disease Father   . Cancer Brother        throat    Social History Social History   Tobacco  Use  . Smoking status: Never Smoker  . Smokeless tobacco: Never Used  Substance Use Topics  . Alcohol use: Yes    Comment: Regular (bourbon and coke 2-3/day)  . Drug use: No     Allergies   No known allergies   Review of Systems Review of Systems  Constitutional: Negative for chills and fever.  HENT: Negative for ear pain and sore throat.   Eyes: Negative for pain and visual disturbance.  Respiratory: Negative for cough and shortness of breath.   Cardiovascular: Negative for chest pain and palpitations.  Gastrointestinal: Negative for abdominal pain and vomiting.  Genitourinary: Negative for dysuria and hematuria.  Musculoskeletal: Negative for arthralgias and back pain.  Skin: Positive for wound. Negative for color change and rash.  Neurological: Positive for tremors and weakness. Negative for seizures and syncope.  All other systems reviewed and are negative.    Physical Exam Triage Vital Signs ED Triage Vitals  Enc Vitals Group     BP 11/21/18 1108 (!) 145/57     Pulse Rate 11/21/18 1108 74     Resp 11/21/18 1108 18     Temp 11/21/18 1108 98.2 F (36.8 C)     Temp Source 11/21/18 1108 Oral     SpO2 11/21/18 1108 97 %   No data found.  Updated Vital Signs BP (!) 145/57 (BP Location: Right Arm)   Pulse 74   Temp 98.2 F (36.8 C) (Oral)   Resp 18   SpO2 97%      Physical Exam Constitutional:      General: He is not in acute distress.    Appearance: He is well-developed. He is obese.     Comments: Obese abdomen  HENT:     Head: Normocephalic.      Nose: Nose normal. No congestion.     Mouth/Throat:     Mouth: Mucous membranes are moist.     Pharynx: No posterior oropharyngeal erythema.  Eyes:     Conjunctiva/sclera: Conjunctivae normal.     Pupils: Pupils are equal, round, and reactive to light.  Neck:     Musculoskeletal: Normal range of motion. No neck rigidity or muscular tenderness.  Cardiovascular:     Rate and Rhythm: Normal rate and  regular  rhythm.     Heart sounds: Murmur present.  Pulmonary:     Effort: Pulmonary effort is normal. No respiratory distress.     Breath sounds: Normal breath sounds.  Abdominal:     General: There is no distension.     Palpations: Abdomen is soft.  Musculoskeletal: Normal range of motion.        General: Signs of injury present. No swelling, tenderness or deformity.     Comments: Abrasions, mild soft tissue swelling to both elbows.  Skin:    General: Skin is warm and dry.  Neurological:     General: No focal deficit present.     Mental Status: He is alert.  Psychiatric:        Mood and Affect: Mood normal.        Behavior: Behavior normal.      UC Treatments / Results  Labs (all labs ordered are listed, but only abnormal results are displayed) Labs Reviewed - No data to display  EKG   Radiology No results found.  Procedures Laceration Repair  Date/Time: 11/21/2018 3:30 PM Performed by: Raylene Everts, MD Authorized by: Raylene Everts, MD   Consent:    Consent obtained:  Verbal   Consent given by:  Patient   Risks discussed:  Infection   Alternatives discussed:  No treatment Anesthesia (see MAR for exact dosages):    Anesthesia method:  Local infiltration   Local anesthetic:  Lidocaine 1% WITH epi Laceration details:    Location:  Face   Face location:  Forehead   Length (cm):  3   Depth (mm):  1.5 Pre-procedure details:    Preparation:  Patient was prepped and draped in usual sterile fashion Exploration:    Hemostasis achieved with:  Direct pressure   Contaminated: no   Treatment:    Area cleansed with:  Betadine   Amount of cleaning:  Extensive   Irrigation solution:  Sterile saline   Irrigation volume:  30   Irrigation method:  Syringe Skin repair:    Repair method:  Sutures   Suture size:  4-0   Suture material:  Prolene   Suture technique:  Simple interrupted   Number of sutures:  5 Approximation:    Approximation:  Close Post-procedure  details:    Dressing:  Antibiotic ointment   Patient tolerance of procedure:  Tolerated well, no immediate complications Comments:     There was bleeding during the procedure.  Controlled with pressure.  At the end of the procedure there was no obvious bleeding.  He was told to go home and continue to put pressure and ice on area to make sure he did not develop a hematoma   (including critical care time)  Medications Ordered in UC Medications - No data to display  Initial Impression / Assessment and Plan / UC Course  I have reviewed the triage vital signs and the nursing notes.  Pertinent labs & imaging results that were available during my care of the patient were reviewed by me and considered in my medical decision making (see chart for details).    See HPI  Final Clinical Impressions(s) / UC Diagnoses   Final diagnoses:  Facial laceration, initial encounter  Fall, initial encounter     Discharge Instructions     Put a light coat of antibiotic ointment on the wounds 2 to 3 times a day Watch for signs of infection Sutures need to come out in 5 to 7 days Go  home with an ice pack on the sutured wound for 20 minutes Follow-up with your primary care doctor.  You may return here for suture removal. If you develop headache, dizziness, blurred vision, trouble with balance go directly to the hospital   ED Prescriptions    None     Controlled Substance Prescriptions  Controlled Substance Registry consulted? Not Applicable   Raylene Everts, MD 11/21/18 [phone]

## 2018-11-22 NOTE — Progress Notes (Signed)
I reviewed health advisor's note, was available for consultation, and agree with documentation and plan.  

## 2018-11-25 DIAGNOSIS — S50312A Abrasion of left elbow, initial encounter: Secondary | ICD-10-CM | POA: Diagnosis not present

## 2018-11-25 DIAGNOSIS — L570 Actinic keratosis: Secondary | ICD-10-CM | POA: Diagnosis not present

## 2018-11-27 ENCOUNTER — Ambulatory Visit (HOSPITAL_COMMUNITY)
Admission: EM | Admit: 2018-11-27 | Discharge: 2018-11-27 | Disposition: A | Discharge status: Home / Self Care | Payer: PPO

## 2018-11-27 NOTE — ED Triage Notes (Signed)
Pt here for suture removal. Sutures removed from R eyebrow, 5 total. WOund is well healed, edges approximated. NO bleeding. Wound above R eyebrow dressed and small skin tear dressed on L elbow.

## 2018-11-28 ENCOUNTER — Other Ambulatory Visit: Payer: Self-pay

## 2018-11-28 ENCOUNTER — Ambulatory Visit (INDEPENDENT_AMBULATORY_CARE_PROVIDER_SITE_OTHER): Payer: PPO | Admitting: General Practice

## 2018-11-28 DIAGNOSIS — Z7901 Long term (current) use of anticoagulants: Secondary | ICD-10-CM | POA: Diagnosis not present

## 2018-11-28 LAB — POCT INR: INR: 1.5 — AB (ref 2.0–3.0)

## 2018-11-28 NOTE — Patient Instructions (Addendum)
Pre visit review using our clinic review tool, if applicable. No additional management support is needed unless otherwise documented below in the visit note.  Take 2 tablets today (7/9) and take 1 tablet tomorrow (7/10).  On Saturday continue to take 1/2 tablet daily except 1 tablet on Monday/Thursday and Saturday.  Re-check in 2 weeks.

## 2018-12-07 ENCOUNTER — Other Ambulatory Visit: Payer: Self-pay | Admitting: Family Medicine

## 2018-12-12 ENCOUNTER — Ambulatory Visit (INDEPENDENT_AMBULATORY_CARE_PROVIDER_SITE_OTHER): Payer: PPO | Admitting: General Practice

## 2018-12-12 ENCOUNTER — Other Ambulatory Visit: Payer: Self-pay

## 2018-12-12 DIAGNOSIS — Z7901 Long term (current) use of anticoagulants: Secondary | ICD-10-CM

## 2018-12-12 LAB — POCT INR: INR: 2.1 (ref 2.0–3.0)

## 2018-12-12 MED ORDER — GLUCOSE BLOOD VI STRP: ORAL_STRIP | 2 refills | Status: DC

## 2018-12-12 NOTE — Patient Instructions (Addendum)
Pre visit review using our clinic review tool, if applicable. No additional management support is needed unless otherwise documented below in the visit note.  Continue to take 1/2 tablet daily except 1 tablet on Monday/Thursday and Saturday.  Re-check in 4 weeks.

## 2019-01-09 ENCOUNTER — Ambulatory Visit (INDEPENDENT_AMBULATORY_CARE_PROVIDER_SITE_OTHER): Payer: PPO | Admitting: General Practice

## 2019-01-09 ENCOUNTER — Other Ambulatory Visit: Payer: Self-pay | Admitting: Family Medicine

## 2019-01-09 DIAGNOSIS — Z7901 Long term (current) use of anticoagulants: Secondary | ICD-10-CM | POA: Diagnosis not present

## 2019-01-09 LAB — POCT INR: INR: 1.8 — AB (ref 2.0–3.0)

## 2019-01-09 NOTE — Patient Instructions (Addendum)
Pre visit review using our clinic review tool, if applicable. No additional management support is needed unless otherwise documented below in the visit note.  Take 1 1/2 tablets today (8/20) and then change dosage and take 1/2 tablet daily except 1 tablet on Sunday, Monday/Thursday and Saturday.  Re-check in 3 to 4 weeks.

## 2019-02-06 ENCOUNTER — Ambulatory Visit (INDEPENDENT_AMBULATORY_CARE_PROVIDER_SITE_OTHER): Payer: PPO | Admitting: General Practice

## 2019-02-06 DIAGNOSIS — Z23 Encounter for immunization: Secondary | ICD-10-CM

## 2019-02-06 DIAGNOSIS — Z7901 Long term (current) use of anticoagulants: Secondary | ICD-10-CM | POA: Diagnosis not present

## 2019-02-06 LAB — POCT INR: INR: 1.7 — AB (ref 2.0–3.0)

## 2019-02-06 NOTE — Patient Instructions (Addendum)
Pre visit review using our clinic review tool, if applicable. No additional management support is needed unless otherwise documented below in the visit note.  Take 1 1/2 tablets today (9/17) and then change dosage and take 1 tablet daily except 1/2 tablet on Tuesdays and Fridays.  Re-check in 4 weeks.

## 2019-03-06 ENCOUNTER — Other Ambulatory Visit: Payer: Self-pay

## 2019-03-06 ENCOUNTER — Ambulatory Visit (INDEPENDENT_AMBULATORY_CARE_PROVIDER_SITE_OTHER): Payer: PPO | Admitting: General Practice

## 2019-03-06 DIAGNOSIS — Z7901 Long term (current) use of anticoagulants: Secondary | ICD-10-CM | POA: Diagnosis not present

## 2019-03-06 LAB — POCT INR: INR: 2.7 (ref 2.0–3.0)

## 2019-03-06 NOTE — Patient Instructions (Signed)
Pre visit review using our clinic review tool, if applicable. No additional management support is needed unless otherwise documented below in the visit note.  Continue to take 1 tablet daily except 1/2 tablet on Tuesdays and Fridays.  Re-check in 4 weeks.

## 2019-03-13 ENCOUNTER — Other Ambulatory Visit: Payer: Self-pay | Admitting: Family Medicine

## 2019-03-13 NOTE — Telephone Encounter (Signed)
Fwd to Dr. Darnell Level to address warfarin refill.

## 2019-03-19 ENCOUNTER — Other Ambulatory Visit: Payer: Self-pay | Admitting: Family Medicine

## 2019-03-19 DIAGNOSIS — E785 Hyperlipidemia, unspecified: Secondary | ICD-10-CM

## 2019-03-19 DIAGNOSIS — I2510 Atherosclerotic heart disease of native coronary artery without angina pectoris: Secondary | ICD-10-CM

## 2019-03-24 ENCOUNTER — Ambulatory Visit: Payer: PPO | Admitting: Family Medicine

## 2019-03-27 ENCOUNTER — Ambulatory Visit (INDEPENDENT_AMBULATORY_CARE_PROVIDER_SITE_OTHER): Payer: PPO | Admitting: Family Medicine

## 2019-03-27 ENCOUNTER — Encounter: Payer: Self-pay | Admitting: Family Medicine

## 2019-03-27 ENCOUNTER — Other Ambulatory Visit: Payer: Self-pay

## 2019-03-27 VITALS — BP 134/72 | HR 83 | Temp 97.8°F | Ht 72.0 in | Wt 212.3 lb

## 2019-03-27 DIAGNOSIS — E1142 Type 2 diabetes mellitus with diabetic polyneuropathy: Secondary | ICD-10-CM

## 2019-03-27 DIAGNOSIS — E663 Overweight: Secondary | ICD-10-CM | POA: Diagnosis not present

## 2019-03-27 DIAGNOSIS — E113513 Type 2 diabetes mellitus with proliferative diabetic retinopathy with macular edema, bilateral: Secondary | ICD-10-CM

## 2019-03-27 DIAGNOSIS — E1151 Type 2 diabetes mellitus with diabetic peripheral angiopathy without gangrene: Secondary | ICD-10-CM

## 2019-03-27 DIAGNOSIS — Z953 Presence of xenogenic heart valve: Secondary | ICD-10-CM

## 2019-03-27 DIAGNOSIS — N183 Chronic kidney disease, stage 3 unspecified: Secondary | ICD-10-CM

## 2019-03-27 DIAGNOSIS — E1122 Type 2 diabetes mellitus with diabetic chronic kidney disease: Secondary | ICD-10-CM

## 2019-03-27 DIAGNOSIS — E538 Deficiency of other specified B group vitamins: Secondary | ICD-10-CM

## 2019-03-27 DIAGNOSIS — K5900 Constipation, unspecified: Secondary | ICD-10-CM

## 2019-03-27 LAB — POCT GLYCOSYLATED HEMOGLOBIN (HGB A1C): Hemoglobin A1C: 5.8 % — AB (ref 4.0–5.6)

## 2019-03-27 LAB — BASIC METABOLIC PANEL
BUN: 31 mg/dL — ABNORMAL HIGH (ref 6–23)
CO2: 27 mEq/L (ref 19–32)
Calcium: 10.2 mg/dL (ref 8.4–10.5)
Chloride: 100 mEq/L (ref 96–112)
Creatinine, Ser: 1.22 mg/dL (ref 0.40–1.50)
GFR: 57.1 mL/min — ABNORMAL LOW (ref >60.00)
Glucose, Bld: 107 mg/dL — ABNORMAL HIGH (ref 70–99)
Potassium: 5.2 mEq/L — ABNORMAL HIGH (ref 3.5–5.1)
Sodium: 135 mEq/L (ref 135–145)

## 2019-03-27 LAB — VITAMIN B12: Vitamin B-12: 446 pg/mL (ref 211–911)

## 2019-03-27 MED ORDER — DOCUSATE SODIUM 100 MG PO CAPS: 100.0000 mg | ORAL_CAPSULE | Freq: Every day | ORAL | Status: DC

## 2019-03-27 NOTE — Assessment & Plan Note (Signed)
Reviewed need for daily bowel regimen - start colace daily. Discussed prunes or prune juice, discussed addition of miralax PRN.

## 2019-03-27 NOTE — Assessment & Plan Note (Addendum)
Chronic, stable. Sees eye doctor yearly.  Continue current regimen. Discussed option of stopping sulfonylurea - however he denies hypoglycemia and feels very comfortable continuing current regimen.

## 2019-03-27 NOTE — Progress Notes (Signed)
This visit was conducted in person.  BP 134/72 (BP Location: Left Arm, Patient Position: Sitting, Cuff Size: Normal)   Pulse 83   Temp 97.8 F (36.6 C) (Temporal)   Ht 6' (1.829 m)   Wt 212 lb 5 oz (96.3 kg)   SpO2 96%   BMI 28.79 kg/m    CC: 6 mo f/u visit Subjective:    Patient ID: Bill [name], male    DOB: [birth year], 78 y.o.   MRN: [id number]  HPI: Bill [name] is a 78 y.o. male presenting on 03/27/2019 for Diabetes (Here for 6 mo f/u.)   Struggling with constipation - does not have bowel regimen. Good fiber in the diet. Good water. Has 1 BM every few days.   Easy bruising noted.   Regularly sees Dr Lovena Le and Dr [name] s/p bioprosthetic AVR, diastolic CHF, persistent afib despite MAZE procedureand LAA ligation.  DM - does regularly check sugars fasting, largely well controlled 115-130. Compliant with antihyperglycemic regimen which includes: glipizide 2.5mg  QD, metformin 500mg  bid. Denies low sugars or hypoglycemic symptoms. Denies paresthesias. Last diabetic eye exam 09/2018. Pneumovax: 2015. Prevnar: 2007. Glucometer brand: one-touch. DSME: declined.  Lab Results  Component Value Date   HGBA1C 5.8 (A) 03/27/2019   Diabetic Foot Exam - Simple   Simple Foot Form Diabetic Foot exam was performed with the following findings: Yes 03/27/2019  8:44 AM  Visual Inspection No deformities, no ulcerations, no other skin breakdown bilaterally: Yes Sensation Testing Intact to touch and monofilament testing bilaterally: Yes Pulse Check Posterior Tibialis and Dorsalis pulse intact bilaterally: Yes Comments    Lab Results  Component Value Date   MICROALBUR 90.0 (H) 09/07/2016        Relevant past medical, surgical, family and social history reviewed and updated as indicated. Interim medical history since our last visit reviewed. Allergies and medications reviewed and updated. Outpatient Medications Prior to Visit  Medication Sig Dispense Refill  . cholecalciferol  (VITAMIN D) 1000 UNITS tablet Take 1,000 Units by mouth as directed.     Marland Kitchen glipiZIDE (GLUCOTROL) 5 MG tablet TAKE 0.5 TABLETS (2.5 MG TOTAL) BY MOUTH DAILY BEFORE BREAKFAST. 45 tablet 2  . glucose blood (ONE TOUCH ULTRA TEST) test strip Use as directed to check sugar once daily.  Dx code:  E11.319 100 each 2  . ibuprofen (ADVIL) 200 MG tablet Take 200 mg by mouth every 6 (six) hours as needed.    Marland Kitchen lisinopril (ZESTRIL) 2.5 MG tablet TAKE 1 TABLET BY MOUTH EVERY DAY 90 tablet 2  . metFORMIN (GLUCOPHAGE) 500 MG tablet TAKE 1 TABLET (500 MG TOTAL) BY MOUTH 2 (TWO) TIMES DAILY WITH A MEAL. 180 tablet 0  . Multiple Vitamin (MULTIVITAMIN WITH MINERALS) TABS tablet Take by mouth daily. 1 packet of 5 vitamins daily    . multivitamin-lutein (OCUVITE-LUTEIN) CAPS capsule Take 3 capsules by mouth as directed.     Marland Kitchen oxybutynin (DITROPAN) 5 MG tablet TAKE 1 TABLET (5 MG TOTAL) BY MOUTH 3 (THREE) TIMES DAILY. 270 tablet 2  . pravastatin (PRAVACHOL) 20 MG tablet TAKE 1 TABLET BY MOUTH EVERY DAY IN THE EVENING 90 tablet 2  . vitamin B-12 (CYANOCOBALAMIN) 1000 MCG tablet Take 1,000 mcg by mouth as directed.     . warfarin (COUMADIN) 2.5 MG tablet TAKE 1 TABLET (2.5 MG TOTAL) BY MOUTH DAILY AT 6 PM. OR AS DIRECTED. 90 tablet 1  . acetaminophen (TYLENOL) 325 MG tablet Take 650 mg by mouth every 4 (four) hours as  needed for mild pain or fever.     No facility-administered medications prior to visit.      Per HPI unless specifically indicated in ROS section below Review of Systems Objective:    BP 134/72 (BP Location: Left Arm, Patient Position: Sitting, Cuff Size: Normal)   Pulse 83   Temp 97.8 F (36.6 C) (Temporal)   Ht 6' (1.829 m)   Wt 212 lb 5 oz (96.3 kg)   SpO2 96%   BMI 28.79 kg/m   Wt Readings from Last 3 Encounters:  03/27/19 212 lb 5 oz (96.3 kg)  09/26/18 215 lb (97.5 kg)  04/10/18 230 lb (104.3 kg)    Physical Exam Vitals signs and nursing note reviewed.  Constitutional:      General:  He is not in acute distress.    Appearance: Normal appearance. He is well-developed. He is not ill-appearing.  HENT:     Head: Normocephalic and atraumatic.     Right Ear: External ear normal.     Left Ear: External ear normal.     Nose: Nose normal.     Mouth/Throat:     Mouth: Mucous membranes are moist.     Pharynx: Oropharynx is clear. No posterior oropharyngeal erythema.  Eyes:     General: No scleral icterus.    Conjunctiva/sclera: Conjunctivae normal.     Pupils: Pupils are equal, round, and reactive to light.  Neck:     Musculoskeletal: Normal range of motion and neck supple.  Cardiovascular:     Rate and Rhythm: Normal rate and regular rhythm.     Pulses: Normal pulses.     Heart sounds: Normal heart sounds. No murmur.  Pulmonary:     Effort: Pulmonary effort is normal. No respiratory distress.     Breath sounds: Normal breath sounds. No wheezing, rhonchi or rales.  Musculoskeletal:     Right lower leg: No edema.     Left lower leg: No edema.     Comments: See HPI for foot exam if done  Lymphadenopathy:     Cervical: No cervical adenopathy.  Skin:    General: Skin is warm and dry.     Findings: No rash.  Neurological:     Mental Status: He is alert.  Psychiatric:        Mood and Affect: Mood normal.        Behavior: Behavior normal.       Results for orders placed or performed in visit on 03/27/19  POCT glycosylated hemoglobin (Hb A1C)  Result Value Ref Range   Hemoglobin A1C 5.8 (A) 4.0 - 5.6 %   HbA1c POC (<> result, manual entry)     HbA1c, POC (prediabetic range)     HbA1c, POC (controlled diabetic range)     Assessment & Plan:   Problem List Items Addressed This Visit    Vitamin B12 deficiency   Relevant Orders   Vitamin B12   S/P aortic valve replacement with bioprosthetic valve + maze procedure   Overweight (BMI 25.0-29.9)    Ongoing weight loss noted - 18 lbs in the past year. He states this is intentional, he continues healthy diet and  lifestyle changes      Diabetic peripheral vascular disease ([place])   Diabetic peripheral neuropathy associated with type 2 diabetes mellitus ([place])   Controlled type 2 diabetes mellitus with proliferative retinopathy ([place]) - Primary    Chronic, stable. Sees eye doctor yearly.  Continue current regimen. Discussed option of stopping sulfonylurea -  however he denies hypoglycemia and feels very comfortable continuing current regimen.       Relevant Orders   POCT glycosylated hemoglobin (Hb A1C) (Completed)   Basic metabolic panel   Constipation    Reviewed need for daily bowel regimen - start colace daily. Discussed prunes or prune juice, discussed addition of miralax PRN.       CKD stage 3 due to type 2 diabetes mellitus ([place])    Update Cr           Meds ordered this encounter  Medications  . docusate sodium (COLACE) 100 MG capsule    Sig: Take 1 capsule (100 mg total) by mouth daily.   Orders Placed This Encounter  Procedures  . Basic metabolic panel  . Vitamin B12  . POCT glycosylated hemoglobin (Hb A1C)    Patient instructions: Start daily bowel regimen - start colace (docusate) 100mg  daily. You could also drink 1 glass of prune or apple juice daily. If this doesn't help, pick up over the counter miralax to take 1/2 capful daily.  Continue current medicines. Labs today.  Return in 6 months for medicare wellness visit/physical.   Follow up plan: Return in about 6 months (around 09/24/2019) for medicare wellness visit, annual exam, prior fasting for blood work.  [name], MD

## 2019-03-27 NOTE — Assessment & Plan Note (Signed)
Update Cr.  

## 2019-03-27 NOTE — Patient Instructions (Addendum)
Start daily bowel regimen - start colace (docusate) 100mg  daily. You could also drink 1 glass of prune or apple juice daily. If this doesn't help, pick up over the counter miralax to take 1/2 capful daily.  Continue current medicines. Labs today.  Return in 6 months for medicare wellness visit/physical.

## 2019-03-27 NOTE — Assessment & Plan Note (Signed)
Ongoing weight loss noted - 18 lbs in the past year. He states this is intentional, he continues healthy diet and lifestyle changes

## 2019-04-10 ENCOUNTER — Other Ambulatory Visit: Payer: Self-pay

## 2019-04-10 ENCOUNTER — Ambulatory Visit (INDEPENDENT_AMBULATORY_CARE_PROVIDER_SITE_OTHER): Payer: PPO | Admitting: General Practice

## 2019-04-10 DIAGNOSIS — Z7901 Long term (current) use of anticoagulants: Secondary | ICD-10-CM

## 2019-04-10 LAB — POCT INR: INR: 3.5 — AB (ref 2.0–3.0)

## 2019-04-10 NOTE — Patient Instructions (Addendum)
Pre visit review using our clinic review tool, if applicable. No additional management support is needed unless otherwise documented below in the visit note.  Skip coumadin today (11/19) and then change dosage and take 1 tablet daily except 1/2 tablet on Sunday, Tuesdays and Fridays.  Re-check in 3 to 4 weeks.

## 2019-05-02 ENCOUNTER — Telehealth: Payer: Self-pay

## 2019-05-02 NOTE — Telephone Encounter (Signed)
Called patient to screen him for coumadin appointment on 05/08/2019, no answer and not able to leave a message

## 2019-05-08 ENCOUNTER — Other Ambulatory Visit: Payer: Self-pay

## 2019-05-08 ENCOUNTER — Ambulatory Visit (INDEPENDENT_AMBULATORY_CARE_PROVIDER_SITE_OTHER): Payer: PPO | Admitting: General Practice

## 2019-05-08 DIAGNOSIS — Z7901 Long term (current) use of anticoagulants: Secondary | ICD-10-CM | POA: Diagnosis not present

## 2019-05-08 LAB — POCT INR: INR: 2.4 (ref 2.0–3.0)

## 2019-05-08 NOTE — Patient Instructions (Addendum)
Pre visit review using our clinic review tool, if applicable. No additional management support is needed unless otherwise documented below in the visit note.  Continue to take 1 tablet daily except 1/2 tablet on Sunday, Tuesdays and Fridays.  Re-check in 4 weeks.

## 2019-05-26 DIAGNOSIS — L570 Actinic keratosis: Secondary | ICD-10-CM | POA: Diagnosis not present

## 2019-05-26 DIAGNOSIS — L249 Irritant contact dermatitis, unspecified cause: Secondary | ICD-10-CM | POA: Diagnosis not present

## 2019-06-05 ENCOUNTER — Other Ambulatory Visit: Payer: Self-pay | Admitting: Family Medicine

## 2019-06-05 ENCOUNTER — Ambulatory Visit (INDEPENDENT_AMBULATORY_CARE_PROVIDER_SITE_OTHER): Payer: PPO

## 2019-06-05 ENCOUNTER — Other Ambulatory Visit: Payer: Self-pay

## 2019-06-05 DIAGNOSIS — Z7901 Long term (current) use of anticoagulants: Secondary | ICD-10-CM

## 2019-06-05 LAB — POCT INR: INR: 3.2 — AB (ref 2.0–3.0)

## 2019-06-05 NOTE — Telephone Encounter (Signed)
E-scribed refill 

## 2019-06-05 NOTE — Patient Instructions (Addendum)
Pre visit review using our clinic review tool, if applicable. No additional management support is needed unless otherwise documented below in the visit note.  Hold dose today then return to normal dosing of 1.25mg  Sun, Tues,and Fri then 2.5mg  M, W, Sat. Recheck in 4 wks.

## 2019-06-09 ENCOUNTER — Other Ambulatory Visit: Payer: Self-pay

## 2019-06-09 ENCOUNTER — Encounter: Payer: Self-pay | Admitting: Cardiovascular Disease

## 2019-06-09 ENCOUNTER — Ambulatory Visit: Payer: PPO | Admitting: Cardiovascular Disease

## 2019-06-09 VITALS — BP 132/80 | HR 76 | Ht 72.0 in | Wt 218.0 lb

## 2019-06-09 DIAGNOSIS — I359 Nonrheumatic aortic valve disorder, unspecified: Secondary | ICD-10-CM | POA: Diagnosis not present

## 2019-06-09 DIAGNOSIS — I10 Essential (primary) hypertension: Secondary | ICD-10-CM

## 2019-06-09 DIAGNOSIS — I480 Paroxysmal atrial fibrillation: Secondary | ICD-10-CM

## 2019-06-09 DIAGNOSIS — I498 Other specified cardiac arrhythmias: Secondary | ICD-10-CM

## 2019-06-09 DIAGNOSIS — I5032 Chronic diastolic (congestive) heart failure: Secondary | ICD-10-CM

## 2019-06-09 DIAGNOSIS — E782 Mixed hyperlipidemia: Secondary | ICD-10-CM | POA: Diagnosis not present

## 2019-06-09 NOTE — Progress Notes (Signed)
Cardiology Office Note:    Date:  06/10/2019   ID:  Bill [name], DOB 10-Aug-1940, MRN [id number]  PCP:  [name], MD  Cardiologist:  [name], MD  Electrophysiologist:  None   Referring MD: [name], MD   Chief Complaint  Patient presents with  . Shortness of Breath    History of Present Illness:    Bill [name] is a 79 y.o. male with a hx of aortic valve disease status post bioprosthetic aortic valve replacement, persistent atrial fibrillation status post Maze procedure and left atrial appendage clip, and chronic diastolic heart failure.  The patient presents for follow-up evaluation.  The patient has had persistent atrial fibrillation even on dofetilide and this was ultimately discontinued.  He has been treated with rate control and chronic oral anticoagulation with warfarin.  The patient is here alone today. His primary complaint is shortness of breath with activity. This occurs with low level activity. He denies orthopnea or PND.  Also feels short of breath with bending over. No chest pain pressure. No leg swelling.  The patient has been dealing with a right hand tremor for quite some time now.  He brings in a report linking the use of an ACE inhibitor to hand tremor.  He wonders if he could have a trial off of the ACE inhibitor.  He has occasional epistaxis.  No other complaints today.  Past Medical History:  Diagnosis Date  . Aortic stenosis 12/2010   s/p bioprosthetic AVR 9/17 // Echo 6/17: EF 55-60, mean AV 25 mmHg // TEE 6/17: severe AS, AVA 0.93, mean AV 43 mmHg // post op Echo 02/07/16: mod conc LVH, EF 65-70, vigorous LVF, AVR ok, mean 9 mmHg, MAC, trivial MR, mod LAE, trivial pericardial eff  . Chronic diastolic CHF (congestive heart failure) ([place])   . Coronary artery disease    LHC 8/17: pLAD 20, mLAD 30, dLAD 20, mRCA 30, dRCA 20, RPDA 20  . Exogenous obesity   . GERD (gastroesophageal reflux disease)    occasional  . History of chicken pox    . Hypertension   . Kidney stones remote  . Lyme disease 2012   ?titers negative  . Neuromuscular disorder (HCC)    tremor right arm  . Persistent atrial fibrillation (HCC)    Tikosyn // s/p Maze procedure 9/17 // Anticoagulation undesirable 2/2 hx of retinal hemorrhage (worked up for [employer] but referred for Maze at time of AVR 2/2 aortic stenosis)   . PFO (patent foramen ovale)    s/p PFO closure at time of AVR in 9/17  . S/P aortic valve replacement with bioprosthetic valve 01/27/2016   27 mm [facility] Ease bovine pericardial tissue valve  . S/P Maze operation for atrial fibrillation 01/27/2016   Complete bilateral atrial lesion set using bipolar radiofrequency and cryothermy ablation with clipping of LA appendage  . Type 2 diabetes, controlled, with retinopathy ([place]) 1980s  . Urge incontinence   . Vitamin B12 deficiency    IF normal (2014)    Past Surgical History:  Procedure Laterality Date  . AORTIC VALVE REPLACEMENT N/A 01/27/2016   Procedure: AORTIC VALVE REPLACEMENT (AVR);  Surgeon: Rexene Alberts, MD;  Location: [place];  Service: Open Heart Surgery;  Laterality: N/A;  . BACK SURGERY  20 yrs ago   removal of lower back cartilage due to damage. Patient does not remember date of surgery.  Marland Kitchen CARDIAC CATHETERIZATION N/A 01/05/2016   Procedure: Right/Left Heart Cath and Coronary Angiography;  Surgeon: Burnell Blanks, MD;  Location: [place] CV LAB;  Service: Cardiovascular;  Laterality: N/A;  . CARDIOVERSION N/A 02/25/2015   Procedure: CARDIOVERSION;  Surgeon: Skeet Latch, MD;  Location: [facility] ENDOSCOPY;  Service: Cardiovascular;  Laterality: N/A;  . CATARACT EXTRACTION Bilateral 2015   [facility] [facility] [place]  . COLONOSCOPY    . EYE SURGERY     left eye "cleaned"  . finger sx    . kidney stone removal    . MAZE N/A 01/27/2016   Procedure: MAZE;  Surgeon: Rexene Alberts, MD;  Location: [place];  Service: Open Heart Surgery;  Laterality: N/A;  . TEE WITHOUT  CARDIOVERSION N/A 11/05/2015   Procedure: TRANSESOPHAGEAL ECHOCARDIOGRAM (TEE);  Surgeon: Pixie Casino, MD;  Location: [place];  Service: Cardiovascular;  Laterality: N/A;  . TEE WITHOUT CARDIOVERSION N/A 01/27/2016   Procedure: TRANSESOPHAGEAL ECHOCARDIOGRAM (TEE);  Surgeon: Rexene Alberts, MD;  Location: [place];  Service: Open Heart Surgery;  Laterality: N/A;  . TOTAL HIP ARTHROPLASTY  03/12/2012   RIGHT TOTAL HIP ARTHROPLASTY ANTERIOR APPROACH;  Surgeon: Mcarthur Rossetti, MD;  . US ECHOCARDIOGRAPHY  12/2010   EF 50-55%, mild LVH, normal wall motion, high LV filling pressures, mild AS, LA mildly dilated    Current Medications: Current Meds  Medication Sig  . cholecalciferol (VITAMIN D) 1000 UNITS tablet Take 1,000 Units by mouth as directed.   Marland Kitchen glipiZIDE (GLUCOTROL) 5 MG tablet TAKE 0.5 TABLETS (2.5 MG TOTAL) BY MOUTH DAILY BEFORE BREAKFAST.  Marland Kitchen glucose blood (ONE TOUCH ULTRA TEST) test strip Use as directed to check sugar once daily.  Dx code:  E11.319  . ibuprofen (ADVIL) 200 MG tablet Take 200 mg by mouth every 6 (six) hours as needed.  Marland Kitchen lisinopril (ZESTRIL) 2.5 MG tablet TAKE 1 TABLET BY MOUTH EVERY DAY  . metFORMIN (GLUCOPHAGE) 500 MG tablet TAKE 1 TABLET (500 MG TOTAL) BY MOUTH 2 (TWO) TIMES DAILY WITH A MEAL.  . Multiple Vitamin (MULTIVITAMIN WITH MINERALS) TABS tablet Take by mouth daily. 1 packet of 5 vitamins daily  . multivitamin-lutein (OCUVITE-LUTEIN) CAPS capsule Take 3 capsules by mouth as directed.   Marland Kitchen oxybutynin (DITROPAN) 5 MG tablet TAKE 1 TABLET (5 MG TOTAL) BY MOUTH 3 (THREE) TIMES DAILY.  . pravastatin (PRAVACHOL) 20 MG tablet TAKE 1 TABLET BY MOUTH EVERY DAY IN THE EVENING  . vitamin B-12 (CYANOCOBALAMIN) 1000 MCG tablet Take 1,000 mcg by mouth as directed.   . warfarin (COUMADIN) 2.5 MG tablet TAKE 1 TABLET (2.5 MG TOTAL) BY MOUTH DAILY AT 6 PM. OR AS DIRECTED.     Allergies:   No known allergies   Social History   Socioeconomic History  . Marital  status: Married    Spouse name: Not on file  . Number of children: Not on file  . Years of education: Not on file  . Highest education level: Not on file  Occupational History  . Not on file  Tobacco Use  . Smoking status: Never Smoker  . Smokeless tobacco: Never Used  Substance and Sexual Activity  . Alcohol use: Yes    Comment: Regular (bourbon and coke 2-3/day)  . Drug use: No  . Sexual activity: Never  Other Topics Concern  . Not on file  Social History Narrative   Caffeine: coffee 1 cup/day   Lives with wife Bill [name]), 1 cat   Occupation: retired, worked for [employer]   Edu: BS   Activity: no regular exercise   Diet: some water, fruits/vegetables  daily      Advanced directives: has living will at home. Does not want prolonged life support "no extraordinary measures" but would accept temporary measures. Would want wife to be HCPOA.    Social Determinants of Health   Financial Resource Strain:   . Difficulty of Paying Living Expenses: Not on file  Food Insecurity:   . Worried About Charity fundraiser in the Last Year: Not on file  . Ran Out of Food in the Last Year: Not on file  Transportation Needs:   . Lack of Transportation (Medical): Not on file  . Lack of Transportation (Non-Medical): Not on file  Physical Activity:   . Days of Exercise per Week: Not on file  . Minutes of Exercise per Session: Not on file  Stress:   . Feeling of Stress : Not on file  Social Connections:   . Frequency of Communication with Friends and Family: Not on file  . Frequency of Social Gatherings with Friends and Family: Not on file  . Attends Religious Services: Not on file  . Active Member of Clubs or Organizations: Not on file  . Attends Archivist Meetings: Not on file  . Marital Status: Not on file     Family History: The patient's family history includes Cancer in his brother; Diabetes in his father and mother; Heart disease in his father and mother.  ROS:   Please see  the history of present illness.    All other systems reviewed and are negative.  EKGs/Labs/Other Studies Reviewed:    The following studies were reviewed today: Echo 02-07-2016: Study Conclusions  - Left ventricle: The cavity size was normal. There was moderate   concentric hypertrophy. Systolic function was vigorous. The   estimated ejection fraction was in the range of 65% to 70%. Wall   motion was normal; there were no regional wall motion   abnormalities. - Aortic valve: A 27 mm [facility] Ease Bovine pericardial   bioprosthetic valve sits well in the aortic position. There is no   abnormal rocking motion. No aortic regurgitation or paravalvular   leak. Normal transaortic gradients. Mean gradient (S): 9 mm Hg.   Peak gradient (S): 14 mm Hg. - Aortic root: The aortic root was normal in size. - Ascending aorta: The ascending aorta was normal in size. - Mitral valve: Calcified annulus. Mildly thickened leaflets .   There was trivial regurgitation. - Left atrium: The atrium was moderately dilated. - Right ventricle: The cavity size was normal. Wall thickness was   normal. Systolic function was normal. - Right atrium: The atrium was normal in size. - Pulmonary arteries: Systolic pressure was within the normal   range. - Inferior vena cava: The vessel was normal in size. - Pericardium, extracardiac: A trivial pericardial effusion was   identified posterior to the heart. Features were not consistent   with tamponade physiology.  Impressions:  - Since the last study on 11/05/2015 there is now a new 27 mm   [facility] Ease Bovine pericardial bioprosthetic valve in the   aortic position. There is no abnormal rocking motion. No aortic   regurgitation or paravalvular leak. Normal transaortic gradients.   EKG:  EKG is ordered today.  The ekg ordered today demonstrates accelerated junctional rhythm 76 bpm, otherwise normal, QRS 88 ms  Recent Labs: 09/25/2018: ALT 12;  Hemoglobin 11.4; Platelets 202.0 03/27/2019: BUN 31; Creatinine, Ser 1.22; Potassium 5.2 No hemolysis seen; Sodium 135  Recent Lipid Panel  Component Value Date/Time   CHOL 140 09/25/2018 0847   TRIG 64.0 09/25/2018 0847   TRIG 71 04/17/2006 1259   HDL 69.00 09/25/2018 0847   CHOLHDL 2 09/25/2018 0847   VLDL 12.8 09/25/2018 0847   LDLCALC 58 09/25/2018 0847   LDLDIRECT 124.4 12/31/2012 0739    Physical Exam:    VS:  BP 132/80   Pulse 76   Ht 6' (1.829 m)   Wt 218 lb (98.9 kg)   SpO2 93%   BMI 29.57 kg/m     Wt Readings from Last 3 Encounters:  06/09/19 218 lb (98.9 kg)  03/27/19 212 lb 5 oz (96.3 kg)  09/26/18 215 lb (97.5 kg)     GEN: Well nourished, well developed elderly male in no acute distress HEENT: Normal NECK: No JVD; No carotid bruits LYMPHATICS: No lymphadenopathy CARDIAC: RRR, soft 1/6 systolic ejection murmur at the right upper sternal border, no diastolic murmur RESPIRATORY:  Clear to auscultation without rales, wheezing or rhonchi  ABDOMEN: Soft, non-tender, non-distended MUSCULOSKELETAL: 1+ bilateral ankle edema; No deformity  SKIN: Warm and dry NEUROLOGIC:  Alert and oriented x 3 PSYCHIATRIC:  Normal affect   ASSESSMENT:    1. Paroxysmal atrial fibrillation (HCC)   2. Aortic valve disease   3. Chronic diastolic heart failure ([place])   4. Accelerated junctional rhythm   5. Essential hypertension   6. Mixed hyperlipidemia    PLAN:    In order of problems listed above:  1. The patient is tolerating anticoagulation with warfarin.  His EKG shows an accelerated junctional rhythm.  I explained that this is common in patients who have undergone surgical maze procedures.  He does not appear to be having any symptoms at this time related atrial fibrillation.  I asked him to stop taking Advil on a as needed basis.  He will use Tylenol for pain as needed. 2. Stable exam.  Will repeat an echocardiogram next year prior to his office visit when he will be  about 5 years out from surgery.  He was treated with a 27 mm bioprosthesis. 3. New York Heart Association functional class IIb symptoms.  Suspect his shortness of breath is multifactorial.  We will continue current management.  Repeat echo next year. 4. As above, no symptoms attributable to his heart rhythm. 5. Blood pressure is controlled.  Will hold lisinopril 2 to 3 weeks to see if his tremor improves.  He will let us know how he does.  If no significant change he will start back on it. 6. Lipids are excellent on pravastatin.  Cholesterol 140, HDL 69, LDL 58 mg/dL.   Medication Adjustments/Labs and Tests Ordered: Current medicines are reviewed at length with the patient today.  Concerns regarding medicines are outlined above.  Orders Placed This Encounter  Procedures  . EKG 12-Lead  . ECHOCARDIOGRAM COMPLETE   No orders of the defined types were placed in this encounter.   Patient Instructions  Medication Instructions:  1) STOP IBUPROFEN and try Tylenol instead 2) HOLD LISINOPRIL for 3 weeks and contact our office to follow-up on your tremor  Testing/Procedures: Your provider has requested that you have an echocardiogram in 1 year. Echocardiography is a painless test that uses sound waves to create images of your heart. It provides your doctor with information about the size and shape of your heart and how well your heart's chambers and valves are working. This procedure takes approximately one hour. There are no restrictions for this procedure.  Follow-Up: You  will be called to arrange your 1 year echo and office visit when Dr. Antionette Char January 2022 schedule is available.    Signed, [name], MD  06/10/2019 1:38 PM    [place]

## 2019-06-09 NOTE — Patient Instructions (Signed)
Medication Instructions:  1) STOP IBUPROFEN and try Tylenol instead 2) HOLD LISINOPRIL for 3 weeks and contact our office to follow-up on your tremor  Testing/Procedures: Your provider has requested that you have an echocardiogram in 1 year. Echocardiography is a painless test that uses sound waves to create images of your heart. It provides your doctor with information about the size and shape of your heart and how well your heart's chambers and valves are working. This procedure takes approximately one hour. There are no restrictions for this procedure.  Follow-Up: You will be called to arrange your 1 year echo and office visit when Dr. Antionette Char January 2022 schedule is available.

## 2019-06-23 DIAGNOSIS — U071 COVID-19: Secondary | ICD-10-CM

## 2019-06-23 HISTORY — DX: COVID-19: U07.1

## 2019-06-30 ENCOUNTER — Telehealth: Payer: Self-pay

## 2019-06-30 NOTE — Telephone Encounter (Signed)
Pt has apt for coumadin clinic tomorrow. LVM. Pt needs to be screened for covid.

## 2019-07-01 ENCOUNTER — Ambulatory Visit (INDEPENDENT_AMBULATORY_CARE_PROVIDER_SITE_OTHER): Payer: PPO

## 2019-07-01 ENCOUNTER — Other Ambulatory Visit: Payer: Self-pay

## 2019-07-01 DIAGNOSIS — Z7901 Long term (current) use of anticoagulants: Secondary | ICD-10-CM

## 2019-07-01 LAB — POCT INR: INR: 1.8 — AB (ref 2.0–3.0)

## 2019-07-01 NOTE — Patient Instructions (Addendum)
Pre visit review using our clinic review tool, if applicable. No additional management support is needed unless otherwise documented below in the visit note.  Increase dose today to one tablet (2.5mg ) then return to normal dosing of 1.25mg  Sun, Tues,and Fri then 2.5mg  M, W, Sat. Recheck in 4 wks.

## 2019-07-03 ENCOUNTER — Ambulatory Visit: Payer: PPO

## 2019-07-07 ENCOUNTER — Ambulatory Visit: Payer: PPO | Attending: Internal Medicine

## 2019-07-07 DIAGNOSIS — Z20822 Contact with and (suspected) exposure to covid-19: Secondary | ICD-10-CM | POA: Diagnosis not present

## 2019-07-08 ENCOUNTER — Telehealth: Payer: Self-pay | Admitting: Family Medicine

## 2019-07-08 ENCOUNTER — Encounter: Payer: Self-pay | Admitting: Family Medicine

## 2019-07-08 LAB — NOVEL CORONAVIRUS, NAA: SARS-CoV-2, NAA: DETECTED — AB

## 2019-07-08 NOTE — Telephone Encounter (Signed)
I received positive Covid test result from patient today.  Please call for update on symptoms - when did symptoms start, what symptoms is he currently having?  He is high risk for covid complication.  Please call antibody infusion hotline to report patient's information.

## 2019-07-08 NOTE — Telephone Encounter (Signed)
Spoke with pt about positive COVID test results.  Pt states his sxs started 06/23/19 and that he only has a minor cough.  Denies any other sxs (ie., fever, SOB, body aches, HA, etc.).  Says the cough is about the same and he is not taking anything for it.  Pt reports he traveled to [facility] 2 wks prior to onset of cough.  FYI to Dr. Darnell Level.  Lvm on [facility] hotline relaying requested info.

## 2019-07-08 NOTE — Telephone Encounter (Addendum)
I'm glad he's doing better. 2wks out from illness anticipate should continue to improve each day. To let us know if any new or worsening symptoms.  Actually if symptoms ongoing past 10 days then would not qualify for Ab infusion.

## 2019-07-09 ENCOUNTER — Telehealth: Payer: Self-pay | Admitting: Physician Assistant

## 2019-07-09 NOTE — Telephone Encounter (Signed)
Spoke with pt relaying Dr. G's message. Pt verbalizes understanding.  

## 2019-07-09 NOTE — Telephone Encounter (Signed)
Called to discuss with Bill [name] about Covid symptoms and the use of bamlanivimab or casirivimab/imdevimab, a monoclonal antibody infusion for those with mild to moderate Covid symptoms and at a high risk of hospitalization.     Pt does not qualify for infusion therapy as pt's symptoms first presented > 10 days prior to timing of infusion. Symptoms tier reviewed as well as criteria for ending isolation. Preventative practices reviewed. Patient verbalized understanding  Patient Active Problem List   Diagnosis Date Noted  . Constipation 03/27/2019  . Left knee pain 09/26/2018  . Long term (current) use of anticoagulants 05/17/2017  . Diabetic peripheral vascular disease ([place]) 09/14/2016  . Coronary artery disease involving native coronary artery of native heart without angina pectoris 09/14/2016  . Dysphagia 03/13/2016  . Spider vein of left lower extremity 03/13/2016  . Encounter for therapeutic drug monitoring 02/07/2016  . S/P aortic valve replacement with bioprosthetic valve + maze procedure 01/27/2016  . S/P Maze operation for atrial fibrillation 01/27/2016  . Severe aortic stenosis   . Health maintenance examination 09/14/2015  . Diabetic peripheral neuropathy associated with type 2 diabetes mellitus ([place]) 02/19/2015  . Advanced care planning/counseling discussion 09/15/2014  . Resting tremor 09/15/2014  . Hyperkalemia 09/15/2014  . Perichondritis of left ear 02/26/2014  . Chronic diastolic heart failure ([place]) 10/16/2013  . CKD stage 3 due to type 2 diabetes mellitus ([place]) 05/15/2013  . Metatarsalgia of right foot 05/09/2013  . Sleep disorder 09/06/2012  . Vitamin B12 deficiency 09/05/2012  . Degenerative arthritis of hip 03/12/2012  . Atrial fibrillation ([place]) 03/11/2012  . Chronic lower back pain 08/23/2011  . Urge incontinence   . Vitamin D deficiency 06/03/2008  . Overweight (BMI 25.0-29.9) 06/03/2008  . False positive serological test for syphilis 06/03/2008  .  BENIGN PROSTATIC HYPERTROPHY, HX OF 06/03/2008  . Controlled type 2 diabetes mellitus with proliferative retinopathy ([place]) 04/23/2007  . HLD (hyperlipidemia) 04/23/2007  . Essential hypertension 12/03/2006    Angelena Form PA-C

## 2019-07-10 ENCOUNTER — Other Ambulatory Visit: Payer: Self-pay | Admitting: Family Medicine

## 2019-07-15 ENCOUNTER — Telehealth: Payer: Self-pay

## 2019-07-15 NOTE — Telephone Encounter (Signed)
Spoke with pt relaying Dr. G's message.  Verbalizes understanding.  

## 2019-07-15 NOTE — Telephone Encounter (Signed)
Mr Squillante left v/m that pt was diagnosed with covid on 07/07/19; pt thought someone from [facility] advised pt to get covid vaccine within 2 wks. Pt wants to know what Dr Darnell Level thinks pt should do in regard to getting covid vaccine and how to schedule covid vaccine.Please advise.

## 2019-07-15 NOTE — Telephone Encounter (Signed)
No - would recommend he wait 3 months from covid illness prior to receiving vaccine.

## 2019-07-31 ENCOUNTER — Ambulatory Visit (INDEPENDENT_AMBULATORY_CARE_PROVIDER_SITE_OTHER): Payer: PPO

## 2019-07-31 ENCOUNTER — Other Ambulatory Visit: Payer: Self-pay

## 2019-07-31 DIAGNOSIS — Z7901 Long term (current) use of anticoagulants: Secondary | ICD-10-CM | POA: Diagnosis not present

## 2019-07-31 LAB — POCT INR: INR: 3.5 — AB (ref 2.0–3.0)

## 2019-07-31 NOTE — Patient Instructions (Addendum)
Pre visit review using our clinic review tool, if applicable. No additional management support is needed unless otherwise documented below in the visit note.  Hold dose today then return to normal dosing of 1.25mg  Sun, Tues,and Fri then 2.5mg  M, W, Thurs, Sat. Recheck in 4 wks.

## 2019-08-28 ENCOUNTER — Ambulatory Visit (INDEPENDENT_AMBULATORY_CARE_PROVIDER_SITE_OTHER): Payer: PPO

## 2019-08-28 ENCOUNTER — Other Ambulatory Visit: Payer: Self-pay

## 2019-08-28 ENCOUNTER — Telehealth: Payer: Self-pay

## 2019-08-28 DIAGNOSIS — Z7901 Long term (current) use of anticoagulants: Secondary | ICD-10-CM

## 2019-08-28 LAB — POCT INR: INR: 1.9 — AB (ref 2.0–3.0)

## 2019-08-28 NOTE — Telephone Encounter (Signed)
Spoke with pt relaying Dr. Synthia Innocent message.  Pt verbalizes understanding and will get vaccine.

## 2019-08-28 NOTE — Telephone Encounter (Signed)
Pt in today for coumadin clinic and reports he will be attending his daughters wedding the end of April. He is concerned about covid. He reports he was diagnosed with covid on 2/5 and was told by PCP to wait 3-6 months to get the covid vaccine. Advised pt he still has antibodies from the virus. He is still concerned and would like to know if Dr. Darnell Level will advise it is ok for him to now have the vaccine. Both he and his wife had the virus and they both want to get the vaccine. Advised a msg would be sent and this office would f/u.  Pt requested msg just be sent via MyChart, no need to call.

## 2019-08-28 NOTE — Patient Instructions (Addendum)
Pre visit review using our clinic review tool, if applicable. No additional management support is needed unless otherwise documented below in the visit note.  Take 3.75mg  (1.5 tablets)  today then return to normal dosing of 1.25mg  Sun, Tues,and Fri then 2.5mg  M, W, Thurs, Sat. Recheck in 4 wks.

## 2019-08-28 NOTE — Telephone Encounter (Signed)
It's been 2 months. Should be ok to go ahead and get the vaccine if he wants to.  Be aware that ppl who have had covid infection can have stronger side effects when getting the vaccine.

## 2019-09-25 ENCOUNTER — Ambulatory Visit (INDEPENDENT_AMBULATORY_CARE_PROVIDER_SITE_OTHER): Payer: PPO

## 2019-09-25 ENCOUNTER — Other Ambulatory Visit: Payer: Self-pay

## 2019-09-25 DIAGNOSIS — Z7901 Long term (current) use of anticoagulants: Secondary | ICD-10-CM

## 2019-09-25 LAB — POCT INR: INR: 1.7 — AB (ref 2.0–3.0)

## 2019-09-25 NOTE — Patient Instructions (Addendum)
Pre visit review using our clinic review tool, if applicable. No additional management support is needed unless otherwise documented below in the visit note.  Take 3.75mg  (1.5 tablets)  today then change weekly dosing take 2.5mg  daily except take 1.25mg  on Tues and Fri and recheck in 4 wks.

## 2019-09-26 ENCOUNTER — Ambulatory Visit: Payer: PPO | Attending: Internal Medicine

## 2019-09-26 DIAGNOSIS — Z23 Encounter for immunization: Secondary | ICD-10-CM

## 2019-09-26 NOTE — Progress Notes (Signed)
   Covid-19 Vaccination Clinic  Name:  Bill [name]    MRN: [id number] DOB: 10-Nov-1940  09/26/2019  [name] was observed post Covid-19 immunization for 15 minutes without incident. He was provided with Vaccine Information Sheet and instruction to access the V-Safe system.   [name] was instructed to call 911 with any severe reactions post vaccine: Marland Kitchen Difficulty breathing  . Swelling of face and throat  . A fast heartbeat  . A bad rash all over body  . Dizziness and weakness   Immunizations Administered    Name Date Dose VIS Date Route   Pfizer COVID-19 Vaccine 09/26/2019 11:28 AM 0.3 mL 07/16/2018 Intramuscular   Manufacturer: [place]   Lot: [id number]   [place]: [id number]

## 2019-10-18 ENCOUNTER — Other Ambulatory Visit: Payer: Self-pay | Admitting: Family Medicine

## 2019-10-21 ENCOUNTER — Ambulatory Visit: Payer: PPO | Attending: Internal Medicine

## 2019-10-21 DIAGNOSIS — Z23 Encounter for immunization: Secondary | ICD-10-CM

## 2019-10-21 NOTE — Progress Notes (Signed)
   Covid-19 Vaccination Clinic  Name:  ICHIRO BAUGHER    MRN: [id number] DOB: August 31, 1940  10/21/2019  Mr. Cercone was observed post Covid-19 immunization for 15 minutes without incident. He was provided with Vaccine Information Sheet and instruction to access the V-Safe system.   Mr. Embury was instructed to call 911 with any severe reactions post vaccine: Marland Kitchen Difficulty breathing  . Swelling of face and throat  . A fast heartbeat  . A bad rash all over body  . Dizziness and weakness   Immunizations Administered    Name Date Dose VIS Date Route   Pfizer COVID-19 Vaccine 10/21/2019  9:49 AM 0.3 mL 07/16/2018 Intramuscular   Manufacturer: [place]   Lot: [id number]   [place]: [id number]

## 2019-10-23 ENCOUNTER — Ambulatory Visit (INDEPENDENT_AMBULATORY_CARE_PROVIDER_SITE_OTHER): Payer: PPO

## 2019-10-23 ENCOUNTER — Other Ambulatory Visit: Payer: Self-pay

## 2019-10-23 DIAGNOSIS — Z7901 Long term (current) use of anticoagulants: Secondary | ICD-10-CM | POA: Diagnosis not present

## 2019-10-23 LAB — POCT INR: INR: 3.1 — AB (ref 2.0–3.0)

## 2019-10-23 NOTE — Patient Instructions (Addendum)
Pre visit review using our clinic review tool, if applicable. No additional management support is needed unless otherwise documented below in the visit note.  Take 1.25mg  today and then  take 2.5mg  daily except take 1.25mg  on Tues and Fri and recheck in 3 wks.

## 2019-11-07 ENCOUNTER — Other Ambulatory Visit: Payer: Self-pay | Admitting: Family Medicine

## 2019-11-07 DIAGNOSIS — I2510 Atherosclerotic heart disease of native coronary artery without angina pectoris: Secondary | ICD-10-CM

## 2019-11-07 DIAGNOSIS — E785 Hyperlipidemia, unspecified: Secondary | ICD-10-CM

## 2019-11-14 ENCOUNTER — Ambulatory Visit (INDEPENDENT_AMBULATORY_CARE_PROVIDER_SITE_OTHER): Payer: PPO

## 2019-11-14 ENCOUNTER — Encounter: Payer: Self-pay | Admitting: Family Medicine

## 2019-11-14 ENCOUNTER — Ambulatory Visit (INDEPENDENT_AMBULATORY_CARE_PROVIDER_SITE_OTHER): Payer: PPO | Admitting: Family Medicine

## 2019-11-14 ENCOUNTER — Other Ambulatory Visit: Payer: Self-pay

## 2019-11-14 VITALS — BP 140/70 | HR 84 | Temp 97.6°F | Ht 70.5 in | Wt 183.2 lb

## 2019-11-14 DIAGNOSIS — R350 Frequency of micturition: Secondary | ICD-10-CM

## 2019-11-14 DIAGNOSIS — E538 Deficiency of other specified B group vitamins: Secondary | ICD-10-CM | POA: Diagnosis not present

## 2019-11-14 DIAGNOSIS — E113553 Type 2 diabetes mellitus with stable proliferative diabetic retinopathy, bilateral: Secondary | ICD-10-CM | POA: Diagnosis not present

## 2019-11-14 DIAGNOSIS — I10 Essential (primary) hypertension: Secondary | ICD-10-CM

## 2019-11-14 DIAGNOSIS — E1122 Type 2 diabetes mellitus with diabetic chronic kidney disease: Secondary | ICD-10-CM | POA: Diagnosis not present

## 2019-11-14 DIAGNOSIS — Z7901 Long term (current) use of anticoagulants: Secondary | ICD-10-CM | POA: Diagnosis not present

## 2019-11-14 DIAGNOSIS — Z0000 Encounter for general adult medical examination without abnormal findings: Secondary | ICD-10-CM | POA: Diagnosis not present

## 2019-11-14 DIAGNOSIS — H40023 Open angle with borderline findings, high risk, bilateral: Secondary | ICD-10-CM | POA: Diagnosis not present

## 2019-11-14 DIAGNOSIS — Z953 Presence of xenogenic heart valve: Secondary | ICD-10-CM | POA: Diagnosis not present

## 2019-11-14 DIAGNOSIS — E559 Vitamin D deficiency, unspecified: Secondary | ICD-10-CM | POA: Diagnosis not present

## 2019-11-14 DIAGNOSIS — I4891 Unspecified atrial fibrillation: Secondary | ICD-10-CM

## 2019-11-14 DIAGNOSIS — N401 Enlarged prostate with lower urinary tract symptoms: Secondary | ICD-10-CM | POA: Diagnosis not present

## 2019-11-14 DIAGNOSIS — H52223 Regular astigmatism, bilateral: Secondary | ICD-10-CM | POA: Diagnosis not present

## 2019-11-14 DIAGNOSIS — E113513 Type 2 diabetes mellitus with proliferative diabetic retinopathy with macular edema, bilateral: Secondary | ICD-10-CM

## 2019-11-14 DIAGNOSIS — N182 Chronic kidney disease, stage 2 (mild): Secondary | ICD-10-CM

## 2019-11-14 DIAGNOSIS — K5903 Drug induced constipation: Secondary | ICD-10-CM

## 2019-11-14 DIAGNOSIS — Z9889 Other specified postprocedural states: Secondary | ICD-10-CM | POA: Diagnosis not present

## 2019-11-14 DIAGNOSIS — N3941 Urge incontinence: Secondary | ICD-10-CM

## 2019-11-14 DIAGNOSIS — G252 Other specified forms of tremor: Secondary | ICD-10-CM

## 2019-11-14 DIAGNOSIS — E782 Mixed hyperlipidemia: Secondary | ICD-10-CM | POA: Diagnosis not present

## 2019-11-14 DIAGNOSIS — Z9841 Cataract extraction status, right eye: Secondary | ICD-10-CM | POA: Diagnosis not present

## 2019-11-14 DIAGNOSIS — E663 Overweight: Secondary | ICD-10-CM | POA: Diagnosis not present

## 2019-11-14 DIAGNOSIS — E1151 Type 2 diabetes mellitus with diabetic peripheral angiopathy without gangrene: Secondary | ICD-10-CM

## 2019-11-14 DIAGNOSIS — Z9842 Cataract extraction status, left eye: Secondary | ICD-10-CM | POA: Diagnosis not present

## 2019-11-14 DIAGNOSIS — Z8679 Personal history of other diseases of the circulatory system: Secondary | ICD-10-CM

## 2019-11-14 DIAGNOSIS — I5032 Chronic diastolic (congestive) heart failure: Secondary | ICD-10-CM

## 2019-11-14 LAB — COMPREHENSIVE METABOLIC PANEL
ALT: 13 U/L (ref 0–53)
AST: 25 U/L (ref 0–37)
Albumin: 4.3 g/dL (ref 3.5–5.2)
Alkaline Phosphatase: 81 U/L (ref 39–117)
BUN: 22 mg/dL (ref 6–23)
CO2: 26 mEq/L (ref 19–32)
Calcium: 11.2 mg/dL — ABNORMAL HIGH (ref 8.4–10.5)
Chloride: 100 mEq/L (ref 96–112)
Creatinine, Ser: 1.07 mg/dL (ref 0.40–1.50)
GFR: 66.32 mL/min (ref >60.00)
Glucose, Bld: 75 mg/dL (ref 70–99)
Potassium: 5 mEq/L (ref 3.5–5.1)
Sodium: 135 mEq/L (ref 135–145)
Total Bilirubin: 0.7 mg/dL (ref 0.2–1.2)
Total Protein: 8.2 g/dL (ref 6.0–8.3)

## 2019-11-14 LAB — HEMOGLOBIN A1C: Hgb A1c MFr Bld: 5.5 % (ref 4.6–6.5)

## 2019-11-14 LAB — VITAMIN D 25 HYDROXY (VIT D DEFICIENCY, FRACTURES): VITD: 54.5 ng/mL (ref 30.00–100.00)

## 2019-11-14 LAB — TSH: TSH: 3.45 u[IU]/mL (ref 0.35–4.50)

## 2019-11-14 LAB — CBC WITH DIFFERENTIAL/PLATELET
Basophils Absolute: 0 10*3/uL (ref 0.0–0.1)
Basophils Relative: 0.5 % (ref 0.0–3.0)
Eosinophils Absolute: 0.1 10*3/uL (ref 0.0–0.7)
Eosinophils Relative: 1.3 % (ref 0.0–5.0)
HCT: 35.7 % — ABNORMAL LOW (ref 39.0–52.0)
Hemoglobin: 11.9 g/dL — ABNORMAL LOW (ref 13.0–17.0)
Lymphocytes Relative: 13.8 % (ref 12.0–46.0)
Lymphs Abs: 0.7 10*3/uL (ref 0.7–4.0)
MCHC: 33.5 g/dL (ref 30.0–36.0)
MCV: 94.5 fl (ref 78.0–100.0)
Monocytes Absolute: 0.5 10*3/uL (ref 0.1–1.0)
Monocytes Relative: 11.1 % (ref 3.0–12.0)
Neutro Abs: 3.5 10*3/uL (ref 1.4–7.7)
Neutrophils Relative %: 73.3 % (ref 43.0–77.0)
Platelets: 217 10*3/uL (ref 150.0–400.0)
RBC: 3.77 Mil/uL — ABNORMAL LOW (ref 4.22–5.81)
RDW: 14 % (ref 11.5–15.5)
WBC: 4.8 10*3/uL (ref 4.0–10.5)

## 2019-11-14 LAB — POC URINALSYSI DIPSTICK (AUTOMATED)
Bilirubin, UA: NEGATIVE
Glucose, UA: NEGATIVE
Ketones, UA: NEGATIVE
Nitrite, UA: POSITIVE
Protein, UA: POSITIVE — AB
Spec Grav, UA: 1.015 (ref 1.010–1.025)
Urobilinogen, UA: 0.2 E.U./dL
pH, UA: 6 (ref 5.0–8.0)

## 2019-11-14 LAB — LIPID PANEL
Cholesterol: 132 mg/dL (ref 0–200)
HDL: 62.5 mg/dL (ref >39.00)
LDL Cholesterol: 61 mg/dL (ref 0–99)
NonHDL: 69.82
Total CHOL/HDL Ratio: 2
Triglycerides: 44 mg/dL (ref 0.0–149.0)
VLDL: 8.8 mg/dL (ref 0.0–40.0)

## 2019-11-14 LAB — POCT INR: INR: 1.9 — AB (ref 2.0–3.0)

## 2019-11-14 LAB — VITAMIN B12: Vitamin B-12: 352 pg/mL (ref 211–911)

## 2019-11-14 LAB — HM DIABETES EYE EXAM

## 2019-11-14 MED ORDER — POLYETHYLENE GLYCOL 3350 17 GM/SCOOP PO POWD: 8.5000 g | Freq: Every day | ORAL | 1 refills | Status: DC

## 2019-11-14 MED ORDER — DOCUSATE SODIUM 50 MG PO CAPS: 50.0000 mg | ORAL_CAPSULE | Freq: Two times a day (BID) | ORAL | Status: DC

## 2019-11-14 NOTE — Assessment & Plan Note (Signed)
Preventative protocols reviewed and updated unless pt declined. Discussed healthy diet and lifestyle.  

## 2019-11-14 NOTE — Patient Instructions (Addendum)
Pre visit review using our clinic review tool, if applicable. No additional management support is needed unless otherwise documented below in the visit note.  Take 2.5mg  today and then  take 2.5mg  daily except take 1.25mg  on Tues and Fri and recheck in 4 wks.

## 2019-11-14 NOTE — Assessment & Plan Note (Signed)

## 2019-11-14 NOTE — Assessment & Plan Note (Addendum)
Longstanding previously thought essential tremor. Previously declined further evaluation. Now agrees to neurology eval.  With recent unsteadiness resulting in falls, some cogwheel rigidity noted on exam as well as ?masked facies, question parkinsonism. No shuffling gait, no anosmia.

## 2019-11-14 NOTE — Patient Instructions (Addendum)
Labs today, urinalysis today.  Ensure source of protein with every meal.  I recommend 3 meals a day.  If interested, check with pharmacy about new 2 shot shingles series (shingrix).  We will refer you to neurology for further evaluation of hand tremor.  Work on water and fiber in [employer] for constipation. Start stool softener and/or miralax 1/2-1 capful daily for goal 1 soft stool every 1-2 days.  Return in 6 months for follow up visit.   Health Maintenance After Age 62 After age 48, you are at a higher risk for certain long-term diseases and infections as well as injuries from falls. Falls are a major cause of broken bones and head injuries in people who are older than age 100. Getting regular preventive care can help to keep you healthy and well. Preventive care includes getting regular testing and making lifestyle changes as recommended by your health care provider. Talk with your health care provider about:  Which screenings and tests you should have. A screening is a test that checks for a disease when you have no symptoms.  A diet and exercise plan that is right for you. What should I know about screenings and tests to prevent falls? Screening and testing are the best ways to find a health problem early. Early diagnosis and treatment give you the best chance of managing medical conditions that are common after age 32. Certain conditions and lifestyle choices may make you more likely to have a fall. Your health care provider may recommend:  Regular vision checks. Poor vision and conditions such as cataracts can make you more likely to have a fall. If you wear glasses, make sure to get your prescription updated if your vision changes.  Medicine review. Work with your health care provider to regularly review all of the medicines you are taking, including over-the-counter medicines. Ask your health care provider about any side effects that may make you more likely to have a fall. Tell your  health care provider if any medicines that you take make you feel dizzy or sleepy.  Osteoporosis screening. Osteoporosis is a condition that causes the bones to get weaker. This can make the bones weak and cause them to break more easily.  Blood pressure screening. Blood pressure changes and medicines to control blood pressure can make you feel dizzy.  Strength and balance checks. Your health care provider may recommend certain tests to check your strength and balance while standing, walking, or changing positions.  Foot health exam. Foot pain and numbness, as well as not wearing proper footwear, can make you more likely to have a fall.  Depression screening. You may be more likely to have a fall if you have a fear of falling, feel emotionally low, or feel unable to do activities that you used to do.  Alcohol use screening. Using too much alcohol can affect your balance and may make you more likely to have a fall. What actions can I take to lower my risk of falls? General instructions  Talk with your health care provider about your risks for falling. Tell your health care provider if: ? You fall. Be sure to tell your health care provider about all falls, even ones that seem minor. ? You feel dizzy, sleepy, or off-balance.  Take over-the-counter and prescription medicines only as told by your health care provider. These include any supplements.  Eat a healthy diet and maintain a healthy weight. A healthy diet includes low-fat dairy products, low-fat (lean) meats, and  fiber from whole grains, beans, and lots of fruits and vegetables. Home safety  Remove any tripping hazards, such as rugs, cords, and clutter.  Install safety equipment such as grab bars in bathrooms and safety rails on stairs.  Keep rooms and walkways well-lit. Activity   Follow a regular exercise program to stay fit. This will help you maintain your balance. Ask your health care provider what types of exercise are  appropriate for you.  If you need a cane or walker, use it as recommended by your health care provider.  Wear supportive shoes that have nonskid soles. Lifestyle  Do not drink alcohol if your health care provider tells you not to drink.  If you drink alcohol, limit how much you have: ? 0-1 drink a day for women. ? 0-2 drinks a day for men.  Be aware of how much alcohol is in your drink. In the [place] one drink equals one typical bottle of beer (12 oz), one-half glass of wine (5 oz), or one shot of hard liquor (1 oz).  Do not use any products that contain nicotine or tobacco, such as cigarettes and e-cigarettes. If you need help quitting, ask your health care provider. Summary  Having a healthy lifestyle and getting preventive care can help to protect your health and wellness after age 56.  Screening and testing are the best way to find a health problem early and help you avoid having a fall. Early diagnosis and treatment give you the best chance for managing medical conditions that are more common for people who are older than age 51.  Falls are a major cause of broken bones and head injuries in people who are older than age 69. Take precautions to prevent a fall at home.  Work with your health care provider to learn what changes you can make to improve your health and wellness and to prevent falls. This information is not intended to replace advice given to you by your health care provider. Make sure you discuss any questions you have with your health care provider. Document Revised: 08/29/2018 Document Reviewed: 03/21/2017 Elsevier Patient Education  2020 [employer].

## 2019-11-14 NOTE — Progress Notes (Signed)
This visit was conducted in person.  BP 140/70 (BP Location: Left Arm, Patient Position: Sitting, Cuff Size: Normal)   Pulse 84   Temp 97.6 F (36.4 C) (Temporal)   Ht 5' 10.5" (1.791 m)   Wt 183 lb 4 oz (83.1 kg)   SpO2 96%   BMI 25.92 kg/m    CC: AMW/CPE Subjective:    Patient ID: Bill [name], male    DOB: 11-03-1940, 79 y.o.   MRN: [id number]  HPI: Bill [name] is a 79 y.o. male presenting on 11/14/2019 for Medicare Wellness   Did not see health advisor.  Coumadin clinic appt today.  Lab Results  Component Value Date   INR 1.9 (A) 11/14/2019   INR 3.1 (A) 10/23/2019   INR 1.7 (A) 09/25/2019  Continued stress over wife with significant depression.    Hearing Screening   125Hz  250Hz  500Hz  1000Hz  2000Hz  3000Hz  4000Hz  6000Hz  8000Hz   Right ear:   20 20 40  0    Left ear:   20 20 20   0    Vision Screening Comments: Last eye exam, today.    Office Visit from 11/14/2019 in [place] at [facility] Total Score 0      Fall Risk  11/14/2019 09/25/2018 09/11/2017 09/07/2016 08/08/2016  Falls in the past year? 1 0 No No No  Number falls in past yr: 1 - - - -  Injury with Fall? 1 - - - -  Risk for fall due to : - - - - Impaired balance/gait  Risk for fall due to: Comment - - - - -      Regularly sees Dr Lovena Le and Dr [name] s/p bioprosthetic AVR, diastolic CHF, persistent afib despite MAZE procedureand LAA ligation.   2 falls in the last few months - #1 dog pulled him over, then #2 fell pulling up weeds, fell down and couldn't stand up.   35lbs in the last 4 months - after COVID illness. Initially lost 15 lbs. He has been trying to lose weight however. Notes muscle mass loss and progressive weakness with weight loss.   Preventative: Colonoscopy 2002, told normal. Cologuard 09/2015 normal, 09/2018 normal. Aged out. Prostate screening - always normal. Denies nocturia, hesitancy or changes in stream.Agedout.   Lung cancer screening - not eligible  Flu  shot yearly  Pneumonia shot 2007. Prevnar 08/2013 Td 2013  COVID vaccine completed [place] 10/2019  zostavax - 09/2013.  Shingrix -discussed. Advanced directives:scanned into chart 12/2015 - HCPOA are [place] Mudrick then [place]. Does not want life prolonging measures if prolonged unconscious. Seat belt use discussed Sunscreen use discussed. No changing moles on skin.Sees derm Allyson Sabal derm). Non smoker  Alcohol - 3-4 1oz mixed drinks a night  Eye exam yearly  Dentist - Q6 months  Bowels- constipation hard stools every 3-4 days  Bladder- worsening urge incontinence on oxybutynin 5mg  TID, wears 2 pads daily that he fills up predominantly at night when watching TV. Finds oxybutynin helps with this. No stress incontinence symptoms. Oxybutynin XR wasn't as effective. Urinary hesitancy, incomplete voiding, voids second time after 30 sec waiting on commode.  Caffeine: coffee 1 cup/day Lives with wife [name]), 1 cat Occupation: retired, worked for [employer]: BS Activity: no regular exercise, gardening Diet: some water, fruits/vegetables daily     Relevant past medical, surgical, family and social history reviewed and updated as indicated. Interim medical history since our last visit reviewed. Allergies and medications reviewed and updated. Outpatient Medications  Prior to Visit  Medication Sig Dispense Refill  . cholecalciferol (VITAMIN D) 1000 UNITS tablet Take 1,000 Units by mouth as directed.     Marland Kitchen glipiZIDE (GLUCOTROL) 5 MG tablet TAKE 0.5 TABLETS (2.5 MG TOTAL) BY MOUTH DAILY BEFORE BREAKFAST. 45 tablet 2  . glucose blood (ONE TOUCH ULTRA TEST) test strip Use as directed to check sugar once daily.  Dx code:  E11.319 100 each 2  . ibuprofen (ADVIL) 200 MG tablet Take 200 mg by mouth every 6 (six) hours as needed.    Marland Kitchen lisinopril (ZESTRIL) 2.5 MG tablet TAKE 1 TABLET BY MOUTH EVERY DAY 90 tablet 1  . metFORMIN (GLUCOPHAGE) 500 MG tablet TAKE 1 TABLET (500 MG TOTAL) BY MOUTH 2 (TWO)  TIMES DAILY WITH A MEAL. 180 tablet 1  . Multiple Vitamin (MULTIVITAMIN WITH MINERALS) TABS tablet Take by mouth daily. 1 packet of 5 vitamins daily    . multivitamin-lutein (OCUVITE-LUTEIN) CAPS capsule Take 3 capsules by mouth as directed.     Marland Kitchen oxybutynin (DITROPAN) 5 MG tablet TAKE 1 TABLET (5 MG TOTAL) BY MOUTH 3 (THREE) TIMES DAILY. 270 tablet 0  . pravastatin (PRAVACHOL) 20 MG tablet TAKE 1 TABLET BY MOUTH EVERY DAY IN THE EVENING 90 tablet 2  . vitamin B-12 (CYANOCOBALAMIN) 1000 MCG tablet Take 1,000 mcg by mouth as directed.     . warfarin (COUMADIN) 2.5 MG tablet TAKE 1 TABLET (2.5 MG TOTAL) BY MOUTH DAILY AT 6 PM. OR AS DIRECTED. 90 tablet 1   No facility-administered medications prior to visit.     Per HPI unless specifically indicated in ROS section below Review of Systems  Constitutional: Negative for activity change, appetite change, chills, fatigue, fever and unexpected weight change.  HENT: Negative for hearing loss.   Eyes: Negative for visual disturbance.  Respiratory: Negative for cough, chest tightness, shortness of breath and wheezing.   Cardiovascular: Negative for chest pain, palpitations and leg swelling.  Gastrointestinal: Positive for constipation (see HPI). Negative for abdominal distention, abdominal pain, blood in stool, diarrhea, nausea and vomiting.  Genitourinary: Negative for difficulty urinating and hematuria.  Musculoskeletal: Negative for arthralgias, myalgias and neck pain.  Skin: Negative for rash.  Neurological: Negative for dizziness, seizures, syncope and headaches.  Hematological: Negative for adenopathy. Bruises/bleeds easily.  Psychiatric/Behavioral: Negative for dysphoric mood. The patient is not nervous/anxious.    Objective:  BP 140/70 (BP Location: Left Arm, Patient Position: Sitting, Cuff Size: Normal)   Pulse 84   Temp 97.6 F (36.4 C) (Temporal)   Ht 5' 10.5" (1.791 m)   Wt 183 lb 4 oz (83.1 kg)   SpO2 96%   BMI 25.92 kg/m   Wt  Readings from Last 3 Encounters:  11/14/19 183 lb 4 oz (83.1 kg)  06/09/19 218 lb (98.9 kg)  03/27/19 212 lb 5 oz (96.3 kg)      Physical Exam Vitals and nursing note reviewed.  Constitutional:      General: He is not in acute distress.    Appearance: Normal appearance. He is well-developed. He is not ill-appearing.  HENT:     Head: Normocephalic and atraumatic.     Right Ear: Hearing, tympanic membrane, ear canal and external ear normal.     Left Ear: Hearing, tympanic membrane, ear canal and external ear normal.  Eyes:     General: No scleral icterus.    Extraocular Movements: Extraocular movements intact.     Conjunctiva/sclera: Conjunctivae normal.     Pupils: Pupils are equal, round,  and reactive to light.  Neck:     Vascular: No carotid bruit.  Cardiovascular:     Rate and Rhythm: Normal rate and regular rhythm.     Pulses: Normal pulses.          Radial pulses are 2+ on the right side and 2+ on the left side.     Heart sounds: Murmur (2/6 systolic) heard.   Pulmonary:     Effort: Pulmonary effort is normal. No respiratory distress.     Breath sounds: Normal breath sounds. No wheezing, rhonchi or rales.  Abdominal:     General: Abdomen is flat. Bowel sounds are normal. There is no distension.     Palpations: Abdomen is soft. There is no mass.     Tenderness: There is no abdominal tenderness. There is no guarding or rebound.     Hernia: No hernia is present.  Musculoskeletal:        General: Normal range of motion.     Cervical back: Normal range of motion and neck supple.     Right lower leg: No edema.     Left lower leg: No edema.  Lymphadenopathy:     Cervical: No cervical adenopathy.  Skin:    General: Skin is warm and dry.     Findings: No rash.  Neurological:     General: No focal deficit present.     Mental Status: He is alert and oriented to person, place, and time.     Motor: Tremor present.     Comments:  R arm resting tremor present elbow down Some  cogwheel rigidity BUE Mild masked facies  CN grossly intact, station and gait intact Recall 2/3, 3/3 with cue Calculation 5/5 DLROW  Psychiatric:        Mood and Affect: Mood normal.        Behavior: Behavior normal.        Thought Content: Thought content normal.        Judgment: Judgment normal.       Results for orders placed or performed in visit on 11/14/19  Lipid panel  Result Value Ref Range   Cholesterol 132 0 - 200 mg/dL   Triglycerides 44.0 0 - 149 mg/dL   HDL 62.50 >39.00 mg/dL   VLDL 8.8 0.0 - 40.0 mg/dL   LDL Cholesterol 61 0 - 99 mg/dL   Total CHOL/HDL Ratio 2    NonHDL 69.82   Comprehensive metabolic panel  Result Value Ref Range   Sodium 135 135 - 145 mEq/L   Potassium 5.0 3.5 - 5.1 mEq/L   Chloride 100 96 - 112 mEq/L   CO2 26 19 - 32 mEq/L   Glucose, Bld 75 70 - 99 mg/dL   BUN 22 6 - 23 mg/dL   Creatinine, Ser 1.07 0.40 - 1.50 mg/dL   Total Bilirubin 0.7 0.2 - 1.2 mg/dL   Alkaline Phosphatase 81 39 - 117 U/L   AST 25 0 - 37 U/L   ALT 13 0 - 53 U/L   Total Protein 8.2 6.0 - 8.3 g/dL   Albumin 4.3 3.5 - 5.2 g/dL   GFR 66.32 >60.00 mL/min   Calcium 11.2 (H) 8.4 - 10.5 mg/dL  TSH  Result Value Ref Range   TSH 3.45 0.35 - 4.50 uIU/mL  Hemoglobin A1c  Result Value Ref Range   Hgb A1c MFr Bld 5.5 4.6 - 6.5 %  CBC with Differential/Platelet  Result Value Ref Range   WBC 4.8 4.0 - 10.5  K/uL   RBC 3.77 (L) 4.22 - 5.81 Mil/uL   Hemoglobin 11.9 (L) 13.0 - 17.0 g/dL   HCT 35.7 (L) 39 - 52 %   MCV 94.5 78.0 - 100.0 fl   MCHC 33.5 30.0 - 36.0 g/dL   RDW 14.0 11.5 - 15.5 %   Platelets 217.0 150 - 400 K/uL   Neutrophils Relative % 73.3 43 - 77 %   Lymphocytes Relative 13.8 12 - 46 %   Monocytes Relative 11.1 3 - 12 %   Eosinophils Relative 1.3 0 - 5 %   Basophils Relative 0.5 0 - 3 %   Neutro Abs 3.5 1.4 - 7.7 K/uL   Lymphs Abs 0.7 0.7 - 4.0 K/uL   Monocytes Absolute 0.5 0 - 1 K/uL   Eosinophils Absolute 0.1 0 - 0 K/uL   Basophils Absolute 0.0 0 - 0  K/uL  Vitamin B12  Result Value Ref Range   Vitamin B-12 352 211 - 911 pg/mL  VITAMIN D 25 Hydroxy (Vit-D Deficiency, Fractures)  Result Value Ref Range   VITD 54.50 30.00 - 100.00 ng/mL  POCT Urinalysis Dipstick (Automated)  Result Value Ref Range   Color, UA yellow    Clarity, UA cloudy    Glucose, UA Negative Negative   Bilirubin, UA negative    Ketones, UA negative    Spec Grav, UA 1.015 1.010 - 1.025   Blood, UA +/-    pH, UA 6.0 5.0 - 8.0   Protein, UA Positive (A) Negative   Urobilinogen, UA 0.2 0.2 or 1.0 E.U./dL   Nitrite, UA positive    Leukocytes, UA Large (3+) (A) Negative   Lab Results  Component Value Date   PSA 1.30 09/10/2014   PSA 3.18 10/08/2013   PSA 4.13 (H) 09/01/2013   Assessment & Plan:  This visit occurred during the SARS-CoV-2 public health emergency.  Safety protocols were in place, including screening questions prior to the visit, additional usage of staff PPE, and extensive cleaning of exam room while observing appropriate contact time as indicated for disinfecting solutions.   Problem List Items Addressed This Visit    Vitamin D deficiency (Chronic)    Update levels on 1000 IU daily.       Relevant Orders   VITAMIN D 25 Hydroxy (Vit-D Deficiency, Fractures) (Completed)   Vitamin B12 deficiency    Continue oral b12, update levels.       Relevant Orders   Vitamin B12 (Completed)   Urge incontinence    Ongoing, managing with oxybutynin TID which he feels is beneficial however noted side effects to this medication (dry mouth, constipation). He desires to continue at this time. Check UA today.       Relevant Orders   Urine Culture   S/P Maze operation for atrial fibrillation    Continue coumadin      S/P aortic valve replacement with bioprosthetic valve + maze procedure    Continue coumadin.       Resting tremor    Longstanding previously thought essential tremor. Previously declined further evaluation. Now agrees to neurology eval.    With recent unsteadiness resulting in falls, some cogwheel rigidity noted on exam as well as ?masked facies, question parkinsonism. No shuffling gait, no anosmia.       Relevant Orders   Ambulatory referral to Neurology   Overweight (BMI 25.0-29.9)    Marked weight loss noted - pt states he's trying - by decreased portion sizes and overall feels well. Check labs.  I don't recommend further weight loss but rather 3 meals a day with protein source with each meal. Recommend sooner f/u to monitor this.  Cologuard negative 12/2018.  03/29/2018 232 lbs 09/26/2018 215 lbs 11/14/2019 183 lbs      Medicare annual wellness visit, subsequent - Primary    I have personally reviewed the Medicare Annual Wellness questionnaire and have noted 1. The patient's medical and social history 2. Their use of alcohol, tobacco or illicit drugs 3. Their current medications and supplements 4. The patient's functional ability including ADL's, fall risks, home safety risks and hearing or visual impairment. Cognitive function has been assessed and addressed as indicated.  5. Diet and physical activity 6. Evidence for depression or mood disorders The patients weight, height, BMI have been recorded in the chart. I have made referrals, counseling and provided education to the patient based on review of the above and I have provided the pt with a written personalized care plan for preventive services. Provider list updated.. See scanned questionairre as needed for further documentation. Reviewed preventative protocols and updated unless pt declined.       HLD (hyperlipidemia) (Chronic)    Update levels on pravastatin.  The ASCVD Risk score Mikey Bussing DC Jr., et al., 2013) failed to calculate for the following reasons:   The 2013 ASCVD risk score is only valid for ages 31 to 32       Relevant Orders   Lipid panel (Completed)   Comprehensive metabolic panel (Completed)   TSH (Completed)   Health maintenance examination     Preventative protocols reviewed and updated unless pt declined. Discussed healthy diet and lifestyle.       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 With weight loss anticipate will be able to taper off diabetic meds. rec RTC 3-4 mo DM f/u visit.       Relevant Orders   Hemoglobin A1c (Completed)   POCT Urinalysis Dipstick (Automated) (Completed)   Constipation    Antimuscarinic contributing - rec start daily bowel regimen with colace + miralax. miralax sent to pharmacy.       CKD stage 2 due to type 2 diabetes mellitus ([place])    Update Cr - kidneys recently stable.       Relevant Orders   CBC with Differential/Platelet (Completed)   Chronic diastolic heart failure (HCC)    Seems euvolemic.       BPH (benign prostatic hyperplasia)    H/o this. No recent PSA. Check UA       Atrial fibrillation (HCC) (Chronic)    Continues coumadin. Sound regular today.           Meds ordered this encounter  Medications  . docusate sodium (COLACE) 50 MG capsule    Sig: Take 1 capsule (50 mg total) by mouth 2 (two) times daily.  . polyethylene glycol powder (GLYCOLAX/MIRALAX) 17 GM/SCOOP powder    Sig: Take 8.5-17 g by mouth daily.    Dispense:  3350 g    Refill:  1   Orders Placed This Encounter  Procedures  . Urine Culture  . Lipid panel  . Comprehensive metabolic panel  . TSH  . Hemoglobin A1c  . CBC with Differential/Platelet  . Vitamin B12  . VITAMIN D 25 Hydroxy (Vit-D  Deficiency, Fractures)  . Ambulatory referral to Neurology    Referral Priority:   Routine    Referral Type:   Consultation    Referral Reason:   Specialty Services Required    Requested Specialty:   Neurology    Number of Visits Requested:   1  . POCT Urinalysis Dipstick (Automated)    Patient  instructions: Labs today, urinalysis today.  Ensure source of protein with every meal.  I recommend 3 meals a day.  If interested, check with pharmacy about new 2 shot shingles series (shingrix).  We will refer you to neurology for further evaluation of hand tremor.  Work on water and fiber in [employer] for constipation. Start stool softener and/or miralax 1/2-1 capful daily for goal 1 soft stool every 1-2 days.  Return in 6 months for follow up visit.   Follow up plan: Return in about 3 months (around 02/14/2020), or if symptoms worsen or fail to improve, for follow up visit.  [name], MD

## 2019-11-15 NOTE — Assessment & Plan Note (Addendum)
Overdue for f/u - check A1c today. With weight loss anticipate will be able to taper off diabetic meds. rec RTC 3-4 mo DM f/u visit.

## 2019-11-15 NOTE — Assessment & Plan Note (Signed)
Update levels on 1000 IU daily.  

## 2019-11-15 NOTE — Assessment & Plan Note (Signed)
Continue oral b12, update levels.

## 2019-11-15 NOTE — Assessment & Plan Note (Signed)
Seems euvolemic.  

## 2019-11-15 NOTE — Assessment & Plan Note (Addendum)
H/o this. No recent PSA. Check UA

## 2019-11-15 NOTE — Assessment & Plan Note (Signed)
Continue coumadin.  

## 2019-11-15 NOTE — Assessment & Plan Note (Signed)
Continues coumadin. Sound regular today.

## 2019-11-15 NOTE — Assessment & Plan Note (Signed)
Update Cr - kidneys recently stable.

## 2019-11-15 NOTE — Assessment & Plan Note (Signed)
Update levels on pravastatin.  The ASCVD Risk score Mikey Bussing DC Jr., et al., 2013) failed to calculate for the following reasons:   The 2013 ASCVD risk score is only valid for ages 46 to 9

## 2019-11-15 NOTE — Assessment & Plan Note (Signed)
Antimuscarinic contributing - rec start daily bowel regimen with colace + miralax. miralax sent to pharmacy.

## 2019-11-15 NOTE — Assessment & Plan Note (Addendum)
Marked weight loss noted - pt states he's trying - by decreased portion sizes and overall feels well. Check labs. I don't recommend further weight loss but rather 3 meals a day with protein source with each meal. Recommend sooner f/u to monitor this.  Cologuard negative 12/2018.  03/29/2018 232 lbs 09/26/2018 215 lbs 11/14/2019 183 lbs

## 2019-11-15 NOTE — Assessment & Plan Note (Signed)
Ongoing, managing with oxybutynin TID which he feels is beneficial however noted side effects to this medication (dry mouth, constipation). He desires to continue at this time. Check UA today.

## 2019-11-15 NOTE — Assessment & Plan Note (Signed)
Stable period on current regimen of low dose ACEI

## 2019-11-16 LAB — URINE CULTURE
MICRO NUMBER:: 10635621
SPECIMEN QUALITY:: ADEQUATE

## 2019-11-17 ENCOUNTER — Other Ambulatory Visit: Payer: Self-pay | Admitting: Family Medicine

## 2019-11-17 MED ORDER — CEPHALEXIN 500 MG PO CAPS: 500.0000 mg | ORAL_CAPSULE | Freq: Two times a day (BID) | ORAL | 0 refills | Status: DC

## 2019-11-18 ENCOUNTER — Other Ambulatory Visit (INDEPENDENT_AMBULATORY_CARE_PROVIDER_SITE_OTHER): Payer: PPO

## 2019-11-18 ENCOUNTER — Encounter: Payer: Self-pay | Admitting: Family Medicine

## 2019-11-20 ENCOUNTER — Encounter: Payer: Self-pay | Admitting: Family Medicine

## 2019-11-23 LAB — VITAMIN D 1,25 DIHYDROXY
Vitamin D 1, 25 (OH)2 Total: 26 pg/mL (ref 18–72)
Vitamin D2 1, 25 (OH)2: <8 pg/mL
Vitamin D3 1, 25 (OH)2: 26 pg/mL

## 2019-11-23 LAB — PTH, INTACT AND CALCIUM
Calcium: 10 mg/dL (ref 8.6–10.3)
PTH: 41 pg/mL (ref 14–64)

## 2019-11-23 LAB — PTH-RELATED PEPTIDE: PTH-Related Protein (PTH-RP): 17 pg/mL (ref 14–27)

## 2019-11-25 DIAGNOSIS — B356 Tinea cruris: Secondary | ICD-10-CM | POA: Diagnosis not present

## 2019-11-25 DIAGNOSIS — L570 Actinic keratosis: Secondary | ICD-10-CM | POA: Diagnosis not present

## 2019-12-11 ENCOUNTER — Other Ambulatory Visit: Payer: Self-pay

## 2019-12-11 ENCOUNTER — Ambulatory Visit (INDEPENDENT_AMBULATORY_CARE_PROVIDER_SITE_OTHER): Payer: PPO

## 2019-12-11 DIAGNOSIS — Z7901 Long term (current) use of anticoagulants: Secondary | ICD-10-CM | POA: Diagnosis not present

## 2019-12-11 LAB — POCT INR: INR: 1.5 — AB (ref 2.0–3.0)

## 2019-12-11 NOTE — Patient Instructions (Addendum)
Pre visit review using our clinic review tool, if applicable. No additional management support is needed unless otherwise documented below in the visit note.  Take 3.75mg  today and 2.5mg  tomorrow then  change weekly dose to take 2.5mg  daily except take 1.25mg  Fri. Recheck in 2 wks.

## 2019-12-16 ENCOUNTER — Ambulatory Visit (INDEPENDENT_AMBULATORY_CARE_PROVIDER_SITE_OTHER): Payer: PPO | Admitting: Family Medicine

## 2019-12-16 ENCOUNTER — Other Ambulatory Visit: Payer: Self-pay

## 2019-12-16 ENCOUNTER — Encounter: Payer: Self-pay | Admitting: Family Medicine

## 2019-12-16 VITALS — BP 142/88 | HR 77 | Temp 97.9°F | Ht 71.0 in | Wt 189.2 lb

## 2019-12-16 DIAGNOSIS — K5903 Drug induced constipation: Secondary | ICD-10-CM

## 2019-12-16 DIAGNOSIS — Z953 Presence of xenogenic heart valve: Secondary | ICD-10-CM

## 2019-12-16 DIAGNOSIS — E113513 Type 2 diabetes mellitus with proliferative diabetic retinopathy with macular edema, bilateral: Secondary | ICD-10-CM | POA: Diagnosis not present

## 2019-12-16 DIAGNOSIS — E663 Overweight: Secondary | ICD-10-CM

## 2019-12-16 DIAGNOSIS — Z5181 Encounter for therapeutic drug level monitoring: Secondary | ICD-10-CM | POA: Diagnosis not present

## 2019-12-16 MED ORDER — WARFARIN SODIUM 2.5 MG PO TABS: 2.5000 mg | ORAL_TABLET | Freq: Every day | ORAL | 1 refills | Status: DC

## 2019-12-16 NOTE — Assessment & Plan Note (Signed)
Coumadin refilled. Has coumadin check scheduled for next week.

## 2019-12-16 NOTE — Assessment & Plan Note (Signed)
Discussed miralax dosing. Daily dose led to stool incontinence. Trial 1/2 capful 2-3 times weekly. Update with effect. If ongoing trouble with too loose stools, suggested transition to colace daily.

## 2019-12-16 NOTE — Assessment & Plan Note (Signed)
Weight gain of a few pounds noted. This is reassuring.

## 2019-12-16 NOTE — Assessment & Plan Note (Signed)
Chronic, stable. Continue metformin, off glipizide. Discussed ok with mildly elevated sugars rather than too low.

## 2019-12-16 NOTE — Patient Instructions (Addendum)
Weight gain noted today.  Continue current medicines Change miralax to 1/2 capful 2-3 times a week - goal for 1 soft stool every day or every other day. If too loose, back off miralax.  Return as needed or in 3 months for follow up visit.

## 2019-12-16 NOTE — Progress Notes (Signed)
This visit was conducted in person.  BP (!) 142/88 (BP Location: Left Arm, Patient Position: Sitting, Cuff Size: Normal)   Pulse 77   Temp 97.9 F (36.6 C) (Temporal)   Ht 5\' 11"  (1.803 m)   Wt 189 lb 3.2 oz (85.8 kg)   SpO2 94%   BMI 26.39 kg/m    CC: 1 mo f/u visit  Subjective:    Patient ID: Bill [name], male    DOB: June 04, 1940, 79 y.o.   MRN: [id number]  HPI: Bill [name] is a 79 y.o. male presenting on 12/16/2019 for Follow-up (Pt took stool softner  5-6 days as directed and it made him too loose. /  bump back left arm - pt feels Might has been a tick, spot is red)   See prior note for details.  35 lb weight loss over preceding 4-5 months, however 6 lb weight gain noted over the past month (he did not take off shoes when weighed this morning). Notes 1-2 lb weight gain at home. Had noted ongoing muscle loss and progressive weakness.   Sugars running ok - 130 this morning.  Recently treated for UTI with keflex course. Urine odor has improved but never had significant UTI symptoms.   Noted spot to left posterior arm - thinks it was a deer tick. Initially swelled but now improving. Something also stung him while mowing grass last week, very painful - with resultant arm swelling - now resolved.   Constipation - had been taking miralax daily for 5 days straight - this caused stool incontinence. He has since stopped, now again constipated - no stool in past 4 days.   Notes nails splitting for several years - suggested try biotin.   Upcoming neurology appt 01/20/2020.  Regularly sees Dr Lovena Le and Dr [name] s/p bioprosthetic AVR, diastolic CHF, persistent afib despite MAZE procedureand LAA ligation.     Relevant past medical, surgical, family and social history reviewed and updated as indicated. Interim medical history since our last visit reviewed. Allergies and medications reviewed and updated. Outpatient Medications Prior to Visit  Medication Sig Dispense Refill  .  cholecalciferol (VITAMIN D) 1000 UNITS tablet Take 1,000 Units by mouth as directed.     Marland Kitchen glucose blood (ONE TOUCH ULTRA TEST) test strip Use as directed to check sugar once daily.  Dx code:  E11.319 100 each 2  . ibuprofen (ADVIL) 200 MG tablet Take 200 mg by mouth every 6 (six) hours as needed.    Marland Kitchen lisinopril (ZESTRIL) 2.5 MG tablet TAKE 1 TABLET BY MOUTH EVERY DAY 90 tablet 1  . metFORMIN (GLUCOPHAGE) 500 MG tablet TAKE 1 TABLET (500 MG TOTAL) BY MOUTH 2 (TWO) TIMES DAILY WITH A MEAL. 180 tablet 1  . Multiple Vitamin (MULTIVITAMIN WITH MINERALS) TABS tablet Take by mouth daily. 1 packet of 5 vitamins daily    . multivitamin-lutein (OCUVITE-LUTEIN) CAPS capsule Take 3 capsules by mouth as directed.     Marland Kitchen oxybutynin (DITROPAN) 5 MG tablet TAKE 1 TABLET (5 MG TOTAL) BY MOUTH 3 (THREE) TIMES DAILY. 270 tablet 0  . pravastatin (PRAVACHOL) 20 MG tablet TAKE 1 TABLET BY MOUTH EVERY DAY IN THE EVENING 90 tablet 2  . warfarin (COUMADIN) 2.5 MG tablet TAKE 1 TABLET (2.5 MG TOTAL) BY MOUTH DAILY AT 6 PM. OR AS DIRECTED. 90 tablet 1  . polyethylene glycol powder (GLYCOLAX/MIRALAX) 17 GM/SCOOP powder Take 17 g by mouth daily as needed for moderate constipation.    . cephALEXin (KEFLEX)  500 MG capsule Take 1 capsule (500 mg total) by mouth 2 (two) times daily. (Patient not taking: Reported on 12/16/2019) 14 capsule 0  . docusate sodium (COLACE) 50 MG capsule Take 1 capsule (50 mg total) by mouth 2 (two) times daily. (Patient not taking: Reported on 12/16/2019)    . polyethylene glycol powder (GLYCOLAX/MIRALAX) 17 GM/SCOOP powder Take 8.5-17 g by mouth daily. (Patient not taking: Reported on 12/16/2019) 3350 g 1  . vitamin B-12 (CYANOCOBALAMIN) 1000 MCG tablet Take 1,000 mcg by mouth as directed.  (Patient not taking: Reported on 12/16/2019)     No facility-administered medications prior to visit.     Per HPI unless specifically indicated in ROS section below Review of Systems Objective:  BP (!) 142/88 (BP  Location: Left Arm, Patient Position: Sitting, Cuff Size: Normal)   Pulse 77   Temp 97.9 F (36.6 C) (Temporal)   Ht 5\' 11"  (1.803 m)   Wt 189 lb 3.2 oz (85.8 kg)   SpO2 94%   BMI 26.39 kg/m   Wt Readings from Last 3 Encounters:  12/16/19 189 lb 3.2 oz (85.8 kg)  11/14/19 183 lb 4 oz (83.1 kg)  06/09/19 218 lb (98.9 kg)      Physical Exam Vitals and nursing note reviewed.  Constitutional:      Appearance: Normal appearance. He is not ill-appearing.  Eyes:     Extraocular Movements: Extraocular movements intact.     Pupils: Pupils are equal, round, and reactive to light.  Cardiovascular:     Rate and Rhythm: Normal rate and regular rhythm.     Pulses: Normal pulses.     Heart sounds: Murmur (3/6 systolic) heard.   Pulmonary:     Effort: Pulmonary effort is normal. No respiratory distress.     Breath sounds: Normal breath sounds. No wheezing, rhonchi or rales.  Abdominal:     General: Abdomen is flat. Bowel sounds are normal. There is no distension.     Palpations: Abdomen is soft. There is no mass.     Tenderness: There is no abdominal tenderness. There is no guarding or rebound.     Hernia: No hernia is present.  Musculoskeletal:     Right lower leg: No edema.     Left lower leg: No edema.  Skin:    General: Skin is warm and dry.     Findings: No rash.  Neurological:     Mental Status: He is alert.  Psychiatric:        Mood and Affect: Mood normal.        Behavior: Behavior normal.       Results for orders placed or performed in visit on 12/11/19  POCT INR  Result Value Ref Range   INR 1.5 (A) 2.0 - 3.0   Assessment & Plan:  This visit occurred during the SARS-CoV-2 public health emergency.  Safety protocols were in place, including screening questions prior to the visit, additional usage of staff PPE, and extensive cleaning of exam room while observing appropriate contact time as indicated for disinfecting solutions.   Problem List Items Addressed This Visit     S/P aortic valve replacement with bioprosthetic valve + maze procedure - Primary   Overweight (BMI 25.0-29.9)    Weight gain of a few pounds noted. This is reassuring.       Encounter for therapeutic drug monitoring    Coumadin refilled. Has coumadin check scheduled for next week.       Controlled type 2  diabetes mellitus with proliferative retinopathy (HCC)    Chronic, stable. Continue metformin, off glipizide. Discussed ok with mildly elevated sugars rather than too low.       Constipation    Discussed miralax dosing. Daily dose led to stool incontinence. Trial 1/2 capful 2-3 times weekly. Update with effect. If ongoing trouble with too loose stools, suggested transition to colace daily.           Meds ordered this encounter  Medications  . warfarin (COUMADIN) 2.5 MG tablet    Sig: Take 1 tablet (2.5 mg total) by mouth daily at 6 PM. Or as directed.    Dispense:  90 tablet    Refill:  1   No orders of the defined types were placed in this encounter.   Patient Instructions  Weight gain noted today.  Continue current medicines Change miralax to 1/2 capful 2-3 times a week - goal for 1 soft stool every day or every other day. If too loose, back off miralax.  Return as needed or in 3 months for follow up visit.    Follow up plan: Return in about 3 months (around 03/17/2020) for follow up visit.  [name], MD

## 2019-12-18 ENCOUNTER — Encounter: Payer: Self-pay | Admitting: Family Medicine

## 2019-12-25 ENCOUNTER — Ambulatory Visit: Payer: PPO

## 2019-12-25 ENCOUNTER — Other Ambulatory Visit: Payer: Self-pay

## 2019-12-25 ENCOUNTER — Ambulatory Visit (INDEPENDENT_AMBULATORY_CARE_PROVIDER_SITE_OTHER): Payer: PPO

## 2019-12-25 DIAGNOSIS — Z7901 Long term (current) use of anticoagulants: Secondary | ICD-10-CM

## 2019-12-25 LAB — POCT INR: INR: 1.9 — AB (ref 2.0–3.0)

## 2019-12-25 NOTE — Patient Instructions (Addendum)
Pre visit review using our clinic review tool, if applicable. No additional management support is needed unless otherwise documented below in the visit note.  Take 3.75mg  today and then change weekly dose to take 2.5mg  daily. Recheck in 4 wks.

## 2020-01-09 ENCOUNTER — Other Ambulatory Visit: Payer: Self-pay | Admitting: Family Medicine

## 2020-01-20 ENCOUNTER — Ambulatory Visit: Payer: PPO | Admitting: Neurology

## 2020-01-20 ENCOUNTER — Other Ambulatory Visit: Payer: Self-pay

## 2020-01-20 ENCOUNTER — Encounter: Payer: Self-pay | Admitting: Neurology

## 2020-01-20 VITALS — BP 142/78 | HR 91 | Ht 72.0 in | Wt 193.3 lb

## 2020-01-20 DIAGNOSIS — G20 Parkinson's disease: Secondary | ICD-10-CM

## 2020-01-20 NOTE — Progress Notes (Signed)
Subjective:    Patient ID: Bill [name] is a 79 y.o. male.  HPI     Bill Age, MD, PhD [facility] [address], [address]O. [place], [place] [address]  Dear Dr. Danise Mina,  I saw your patient, Bill [name], upon your kind request, in the neurologic clinic today for initial consultation of his tremor, concern for parkinsonism.  The patient is unaccompanied today.  As you know, Bill [name] is an 79 year old right-handed gentleman with an underlying complex medical history of type 2 diabetes, history of retinopathy, A. fib, status post maze operation, aortic stenosis with status post aortic valve replacement, patent foramen ovale with history of surgical closure, Lyme disease, kidney stones, hypertension, reflux disease, history of Covid infection in February 2021, coronary artery disease, chronic diastolic CHF, and history of vitamin B12 deficiency, who reports a several year history of right hand tremor.  The tremor has become worse over time.  It is not very bothersome and is notable at rest and sometimes in the middle of the night when he has to go to the bathroom.  He does not have any significant fine motor dyscontrol.  His handwriting is more tremulous.  He denies a family history of tremors or Parkinson's disease.  He has not noticed a tremor in the left hand or in the feet.  Generally speaking, he does not have any significant balance issues but does admit to having fallen.  He fell in the yard as he was pulling weed, he fell forward.  He also fell at home while he was putting the lesion on the dog and the dog started running, the patient hit the floor and hit the right side of the forehead to the ground.  He required stitches.  I reviewed the office note from 11/14/2019.  He had blood work at the time including B12, TSH, A1c, CBC with differential, CMP, lipid panel.  I reviewed the blood test results.  B12 level was 352.  TSH was normal at 3.45.  Lipid panel  showed total cholesterol of 132, triglycerides 44, LDL 61.  HDL was 62.5.    His Past Medical History Is Significant For: Past Medical History:  Diagnosis Date  . Aortic stenosis 12/2010   s/p bioprosthetic AVR 9/17 // Echo 6/17: EF 55-60, mean AV 25 mmHg // TEE 6/17: severe AS, AVA 0.93, mean AV 43 mmHg // post op Echo 02/07/16: mod conc LVH, EF 65-70, vigorous LVF, AVR ok, mean 9 mmHg, MAC, trivial MR, mod LAE, trivial pericardial eff  . Chronic diastolic CHF (congestive heart failure) ([place])   . Coronary artery disease    LHC 8/17: pLAD 20, mLAD 30, dLAD 20, mRCA 30, dRCA 20, RPDA 20  . COVID-19 virus infection 06/2019  . Exogenous obesity   . GERD (gastroesophageal reflux disease)    occasional  . History of chicken pox   . Hypertension   . Kidney stones remote  . Lyme disease 2012   ?titers negative  . Neuromuscular disorder (HCC)    tremor right arm  . Persistent atrial fibrillation (HCC)    Tikosyn // s/p Maze procedure 9/17 // Anticoagulation undesirable 2/2 hx of retinal hemorrhage (worked up for [employer] but referred for Maze at time of AVR 2/2 aortic stenosis)   . PFO (patent foramen ovale)    s/p PFO closure at time of AVR in 9/17  . S/P aortic valve replacement with bioprosthetic valve 01/27/2016   27 mm [facility] Ease bovine  pericardial tissue valve  . S/P Maze operation for atrial fibrillation 01/27/2016   Complete bilateral atrial lesion set using bipolar radiofrequency and cryothermy ablation with clipping of LA appendage  . Type 2 diabetes, controlled, with retinopathy ([place]) 1980s  . Urge incontinence   . Vitamin B12 deficiency    IF normal (2014)    His Past Surgical History Is Significant For: Past Surgical History:  Procedure Laterality Date  . AORTIC VALVE REPLACEMENT N/A 01/27/2016   Procedure: AORTIC VALVE REPLACEMENT (AVR);  Surgeon: Rexene Alberts, MD;  Location: [place];  Service: Open Heart Surgery;  Laterality: N/A;  . BACK SURGERY  20 yrs ago    removal of lower back cartilage due to damage. Patient does not remember date of surgery.  Marland Kitchen CARDIAC CATHETERIZATION N/A 01/05/2016   Procedure: Right/Left Heart Cath and Coronary Angiography;  Surgeon: Burnell Blanks, MD;  Location: [place] CV LAB;  Service: Cardiovascular;  Laterality: N/A;  . CARDIOVERSION N/A 02/25/2015   Procedure: CARDIOVERSION;  Surgeon: Skeet Latch, MD;  Location: [facility] ENDOSCOPY;  Service: Cardiovascular;  Laterality: N/A;  . CATARACT EXTRACTION Bilateral 2015   [place] [facility] [place]  . COLONOSCOPY    . EYE SURGERY     left eye "cleaned"  . finger sx    . kidney stone removal    . MAZE N/A 01/27/2016   Procedure: MAZE;  Surgeon: Rexene Alberts, MD;  Location: [place];  Service: Open Heart Surgery;  Laterality: N/A;  . TEE WITHOUT CARDIOVERSION N/A 11/05/2015   Procedure: TRANSESOPHAGEAL ECHOCARDIOGRAM (TEE);  Surgeon: Pixie Casino, MD;  Location: [place];  Service: Cardiovascular;  Laterality: N/A;  . TEE WITHOUT CARDIOVERSION N/A 01/27/2016   Procedure: TRANSESOPHAGEAL ECHOCARDIOGRAM (TEE);  Surgeon: Rexene Alberts, MD;  Location: [place];  Service: Open Heart Surgery;  Laterality: N/A;  . TOTAL HIP ARTHROPLASTY  03/12/2012   RIGHT TOTAL HIP ARTHROPLASTY ANTERIOR APPROACH;  Surgeon: Mcarthur Rossetti, MD;  . US ECHOCARDIOGRAPHY  12/2010   EF 50-55%, mild LVH, normal wall motion, high LV filling pressures, mild AS, LA mildly dilated  . VASECTOMY     1978?    His Family History Is Significant For: Family History  Problem Relation [name] of Onset  . Diabetes Mother   . Heart disease Mother   . Diabetes Father   . Heart disease Father   . Cancer Brother        throat    His Social History Is Significant For: Social History   Socioeconomic History  . Marital status: Married    Spouse name: Not on file  . Number of children: Not on file  . Years of education: Not on file  . Highest education level: Not on file  Occupational History  .  Not on file  Tobacco Use  . Smoking status: Never Smoker  . Smokeless tobacco: Never Used  Vaping Use  . Vaping Use: Never used  Substance and Sexual Activity  . Alcohol use: Yes    Comment: Regular (bourbon and coke 2-3/day)  . Drug use: No  . Sexual activity: Never  Other Topics Concern  . Not on file  Social History Narrative   Caffeine: coffee 1 cup/day   Lives with wife Bill [name]), 1 cat   Occupation: retired, worked for [employer]   Edu: BS   Activity: no regular exercise   Diet: some water, fruits/vegetables daily      Advanced directives: has living will at home. Does not want prolonged  life support "no extraordinary measures" but would accept temporary measures. Would want wife to be HCPOA.    Social Determinants of Health   Financial Resource Strain:   . Difficulty of Paying Living Expenses: Not on file  Food Insecurity:   . Worried About Charity fundraiser in the Last Year: Not on file  . Ran Out of Food in the Last Year: Not on file  Transportation Needs:   . Lack of Transportation (Medical): Not on file  . Lack of Transportation (Non-Medical): Not on file  Physical Activity:   . Days of Exercise per Week: Not on file  . Minutes of Exercise per Session: Not on file  Stress:   . Feeling of Stress : Not on file  Social Connections:   . Frequency of Communication with Friends and Family: Not on file  . Frequency of Social Gatherings with Friends and Family: Not on file  . Attends Religious Services: Not on file  . Active Member of Clubs or Organizations: Not on file  . Attends Archivist Meetings: Not on file  . Marital Status: Not on file    His Allergies Are:  Allergies  Allergen Reactions  . No Known Allergies   :   His Current Medications Are:  Outpatient Encounter Medications as of 01/20/2020  Medication Sig  . cholecalciferol (VITAMIN D) 1000 UNITS tablet Take 1,000 Units by mouth as directed.   Marland Kitchen glucose blood (ONE TOUCH ULTRA TEST) test strip  Use as directed to check sugar once daily.  Dx code:  E11.319  . ibuprofen (ADVIL) 200 MG tablet Take 200 mg by mouth every 6 (six) hours as needed.  Marland Kitchen lisinopril (ZESTRIL) 2.5 MG tablet TAKE 1 TABLET BY MOUTH EVERY DAY  . metFORMIN (GLUCOPHAGE) 500 MG tablet TAKE 1 TABLET (500 MG TOTAL) BY MOUTH 2 (TWO) TIMES DAILY WITH A MEAL.  . Multiple Vitamin (MULTIVITAMIN WITH MINERALS) TABS tablet Take by mouth daily. 1 packet of 5 vitamins daily  . multivitamin-lutein (OCUVITE-LUTEIN) CAPS capsule Take 3 capsules by mouth as directed.   Marland Kitchen oxybutynin (DITROPAN) 5 MG tablet TAKE 1 TABLET (5 MG TOTAL) BY MOUTH 3 (THREE) TIMES DAILY.  Marland Kitchen polyethylene glycol powder (GLYCOLAX/MIRALAX) 17 GM/SCOOP powder Take 17 g by mouth daily as needed for moderate constipation.  . pravastatin (PRAVACHOL) 20 MG tablet TAKE 1 TABLET BY MOUTH EVERY DAY IN THE EVENING  . warfarin (COUMADIN) 2.5 MG tablet Take 1 tablet (2.5 mg total) by mouth daily at 6 PM. Or as directed.   No facility-administered encounter medications on file as of 01/20/2020.  :   Review of Systems:  Out of a complete 14 point review of systems, all are reviewed and negative with the exception of these symptoms as listed below:  Review of Systems  Neurological:       Pt is here to discuss worsening tremor on the right hand.  Pt reports sx have been present for several years now.  He is right handed.    Objective:  Neurological Exam  Physical Exam Physical Examination:   Vitals:   01/20/20 1016  BP: (!) 142/78  Pulse: 91  SpO2: 97%    General Examination: The patient is a very pleasant 79 y.o. male in no acute distress. He appears well-developed and well-nourished and well groomed.   HEENT: Normocephalic, atraumatic, pupils are equal, round and reactive to light, and accommodation.  Status post cataract extractions. Extraocular tracking shows mild saccadic breakdown.  No obvious nystagmus,  no significant gaze limitation noted.  Hearing is  mildly impaired.  Is good without limitation to gaze excursion or nystagmus noted. Normal smooth pursuit is noted. Hearing is grossly intact. Face is symmetric with mild facial masking noted.  He does not have any significant lip, neck, or jaw tremor, no voice tremor, perhaps mild hypophonia, no dysarthria noted.  Airway examination reveals mild to moderate mouth dryness, tongue protrudes centrally in palate elevates symmetrically, no sialorrhea.  He has mild nuchal rigidity.  No carotid bruits noted.   Chest: Clear to auscultation without wheezing, rhonchi or crackles noted.  Heart: S1+S2+0, regular with prominent second heart sound but no obvious murmur noted.    Abdomen: Soft, non-tender and non-distended with normal bowel sounds appreciated on auscultation.  Extremities: There are chronic appearing bruises across his forearms bilaterally.    Skin: Warm and dry without trophic changes noted.  Musculoskeletal: exam reveals some decreased range of motion in both hip joints.    Neurologically:  Mental status: The patient is awake, alert and oriented in all 4 spheres. His immediate and remote memory, attention, language skills and fund of knowledge are appropriate. There is no evidence of aphasia, agnosia, apraxia or anomia. Speech is clear with normal prosody and enunciation. Thought process is linear. Mood is normal and affect is normal.  Cranial nerves II - XII are as described above under HEENT exam. In addition: shoulder shrug is normal with equal shoulder height noted. Motor exam: Normal bulk, global strength normal for [name], he does have mild increase in tone in the right upper extremity with some cogwheeling noted.  He has a mild resting tremor in the right upper extremity, it is intermittent.  He has no significant postural tremor action tremor in both upper extremities, no lower extremity tremor.  Handwriting shows evidence of micrographia and it is difficult to read, it is tremulous as  well.  Archimedes spiral drawing shows mild tremulousness with the right hand, also with the left hand.   Reflexes are 1+ throughout. Fine motor skills and coordination: Mild impairment with finger taps, hand movements and rapid alternating patting with the right hand, left hand is minimally impaired or normal.  Foot taps and foot agility are mildly impaired bilaterally.  Cerebellar testing: No dysmetria or intention tremor on finger to nose testing. Heel to shin is difficult bilaterally.  No dysmetria noted.  Decreased range of motion noted. There is no truncal or gait ataxia.  Sensory exam: intact to light touch in the upper and lower extremities.  Gait, station and balance: He stands with mild difficulty and pushes himself up.  His posture is [name]-appropriate.  He walks with fairly good pace, decreased stride length, decreased arm swing on the right and more prominent tremor noted in the right hand while walking.  He turns with slight insecurity.  Balance is minimally impaired.    Assessment and Plan:   In summary, Bill [name] is a very pleasant 79 y.o.-year old male  with an underlying complex medical history of type 2 diabetes, history of retinopathy, A. fib, status post maze operation, aortic stenosis with status post aortic valve replacement, patent foramen ovale with history of surgical closure, Lyme disease, kidney stones, hypertension, reflux disease, history of Covid infection in February 2021, coronary artery disease, chronic diastolic CHF, and history of vitamin B12 deficiency, who presents for evaluation of his tremor disorder.  He has had a right hand tremor for several years.  It is mildly progressive but does not  bother him very much.  His examination does show mild signs of parkinsonism with right-sided lateralization noted.  I agree with you that there is the possibility of mild Parkinson's disease.  He is advised of my findings, we talked about his symptoms, and possible avenues we can  take at this time.  We mutually agreed to continue to observe his symptomatology and his examination, he is not particularly affected by his symptoms.  We can certainly consider symptomatic medication in the near future if the need arises.  I do not see a pressing reason for him to pursue a brain scan at this time.  There are no other sinister symptoms, no sudden onset of symptoms, no strokelike presentation.  We talked about the importance of fall prevention and pursuing a healthy lifestyle.  He tries to stay active mentally and physically.  He is advised to stay well rested and change positions slowly, get his bearings first.  He is advised to follow-up routinely in 6 months for recheck, sooner if needed.  I answered all his questions today and he was in agreement.   Thank you very much for allowing me to participate in the care of this nice patient. If I can be of any further assistance to you please do not hesitate to call me at [phone].  Sincerely,   Bill Age, MD, PhD

## 2020-01-20 NOTE — Patient Instructions (Signed)
I think you have signs and symptoms of mild parkinsonism, possibly the early right sided Parkinson's disease.   As discussed, we can observe your symptoms and examination for now, I would like to recheck you in about 6 months.    Remember to drink plenty of fluid at least 6 glasses (8 oz each), eat healthy meals and do not skip any meals. Try to eat protein with a every meal and eat a healthy snack such as fruit or nuts in between meals. Try to keep a regular sleep-wake schedule and try to exercise daily, particularly in the form of walking, about 20 minutes a day, if you can.   Fall risk is real!  You have fallen a couple of times.  Please remember to stand up slowly and get your bearings first turn slowly, no bending down to pick anything, no heavy lifting, be extra careful at night and first thing in the morning. Also, be careful in the Bathroom and the kitchen.   Try to stay active physically and mentally. Engage in social activities in your community and with your family and try to keep up with current events by reading the newspaper or watching the news. Try to do word puzzles and you may like to do puzzles and brain games on the computer or tablet.   I do not see a pressing reason to do a brain scan.  We can revisit this down the road.    Our phone number is [phone]. We also have an after hours call service for urgent matters and there is a physician on-call for urgent questions, that cannot wait till the next work day. For any emergencies you know to call 911 or go to the nearest emergency room.

## 2020-01-22 ENCOUNTER — Other Ambulatory Visit: Payer: Self-pay

## 2020-01-22 ENCOUNTER — Ambulatory Visit (INDEPENDENT_AMBULATORY_CARE_PROVIDER_SITE_OTHER): Payer: PPO

## 2020-01-22 DIAGNOSIS — Z7901 Long term (current) use of anticoagulants: Secondary | ICD-10-CM

## 2020-01-22 LAB — POCT INR: INR: 2.8 (ref 2.0–3.0)

## 2020-01-22 NOTE — Patient Instructions (Addendum)
Pre visit review using our clinic review tool, if applicable. No additional management support is needed unless otherwise documented below in the visit note.  Continue to take 2.5mg  daily. Recheck in 4 wks per pt request.

## 2020-01-26 NOTE — Progress Notes (Signed)
Agree. Thanks

## 2020-01-27 ENCOUNTER — Encounter: Payer: Self-pay | Admitting: Family Medicine

## 2020-01-30 ENCOUNTER — Other Ambulatory Visit: Payer: Self-pay | Admitting: Family Medicine

## 2020-02-04 ENCOUNTER — Encounter: Payer: Self-pay | Admitting: Family Medicine

## 2020-02-04 MED ORDER — GLUCOSE BLOOD VI STRP: ORAL_STRIP | 3 refills | Status: DC

## 2020-02-19 ENCOUNTER — Ambulatory Visit (INDEPENDENT_AMBULATORY_CARE_PROVIDER_SITE_OTHER): Payer: PPO

## 2020-02-19 ENCOUNTER — Other Ambulatory Visit: Payer: Self-pay

## 2020-02-19 DIAGNOSIS — Z23 Encounter for immunization: Secondary | ICD-10-CM | POA: Diagnosis not present

## 2020-02-19 DIAGNOSIS — Z7901 Long term (current) use of anticoagulants: Secondary | ICD-10-CM

## 2020-02-19 LAB — POCT INR: INR: 3.6 — AB (ref 2.0–3.0)

## 2020-02-19 NOTE — Patient Instructions (Addendum)
Pre visit review using our clinic review tool, if applicable. No additional management support is needed unless otherwise documented below in the visit note.  Hold dose today then change weekly dose to take 2.5mg  daily except take 1.25mg  on Wednesdays. Recheck in 3 wks per pt request.

## 2020-02-19 NOTE — Progress Notes (Signed)
Pt requested high dose flu vaccine while in office. High dose flu vaccine administered. Pt tolerated well.   Agree.  Thanks.

## 2020-03-11 ENCOUNTER — Other Ambulatory Visit: Payer: Self-pay

## 2020-03-11 ENCOUNTER — Ambulatory Visit (INDEPENDENT_AMBULATORY_CARE_PROVIDER_SITE_OTHER): Payer: PPO

## 2020-03-11 DIAGNOSIS — Z7901 Long term (current) use of anticoagulants: Secondary | ICD-10-CM | POA: Diagnosis not present

## 2020-03-11 LAB — POCT INR: INR: 3.9 — AB (ref 2.0–3.0)

## 2020-03-11 NOTE — Patient Instructions (Addendum)
Pre visit review using our clinic review tool, if applicable. No additional management support is needed unless otherwise documented below in the visit note.  Hold dose today then change weekly dose to take 2.5mg  daily except take 1.25mg  on Mondays and Thursdays. Recheck in 4 wks.Bill [name]

## 2020-03-14 NOTE — Progress Notes (Signed)
Agree. Thanks

## 2020-03-25 ENCOUNTER — Ambulatory Visit: Payer: PPO

## 2020-04-01 ENCOUNTER — Other Ambulatory Visit: Payer: Self-pay | Admitting: Family Medicine

## 2020-04-08 ENCOUNTER — Other Ambulatory Visit: Payer: Self-pay

## 2020-04-08 ENCOUNTER — Ambulatory Visit (INDEPENDENT_AMBULATORY_CARE_PROVIDER_SITE_OTHER): Payer: PPO

## 2020-04-08 DIAGNOSIS — Z7901 Long term (current) use of anticoagulants: Secondary | ICD-10-CM

## 2020-04-08 LAB — POCT INR: INR: 3.5 — AB (ref 2.0–3.0)

## 2020-04-08 NOTE — Patient Instructions (Addendum)
Pre visit review using our clinic review tool, if applicable. No additional management support is needed unless otherwise documented below in the visit note.  Hold dose today then change weekly dose to take 1.25mg  daily except take 2.5mg  on Mondays, Wednesday, and Friday. Recheck in 3 wks.

## 2020-04-11 NOTE — Progress Notes (Signed)
Agree. Thanks

## 2020-04-20 ENCOUNTER — Telehealth: Payer: Self-pay

## 2020-04-20 NOTE — Telephone Encounter (Signed)
Due to provider schedule change, called patient to reschedule 06/10/2020 appointment.  Left message to call back.

## 2020-04-21 NOTE — Telephone Encounter (Signed)
Rescheduled the patient to 06/04/20 with Dr. [name]. He was grateful for assistance.

## 2020-04-21 NOTE — Telephone Encounter (Signed)
    Pt returning call, pt doesn't want to wait until April to r/s. He said to call his cell phone or home phone number

## 2020-04-29 ENCOUNTER — Ambulatory Visit (INDEPENDENT_AMBULATORY_CARE_PROVIDER_SITE_OTHER): Payer: PPO

## 2020-04-29 ENCOUNTER — Other Ambulatory Visit: Payer: Self-pay

## 2020-04-29 DIAGNOSIS — Z7901 Long term (current) use of anticoagulants: Secondary | ICD-10-CM | POA: Diagnosis not present

## 2020-04-29 LAB — POCT INR: INR: 1.9 — AB (ref 2.0–3.0)

## 2020-04-29 NOTE — Patient Instructions (Addendum)
Pre visit review using our clinic review tool, if applicable. No additional management support is needed unless otherwise documented below in the visit note.  Increase dose today to take 2.5mg  then continue to take 1.25mg  daily except take 2.5mg  on Mondays, Wednesday, and Friday. Recheck in 4 wks.

## 2020-05-02 NOTE — Progress Notes (Signed)
Agree. Thanks

## 2020-05-05 ENCOUNTER — Other Ambulatory Visit: Payer: Self-pay | Admitting: Family Medicine

## 2020-05-27 ENCOUNTER — Ambulatory Visit (INDEPENDENT_AMBULATORY_CARE_PROVIDER_SITE_OTHER): Payer: PPO

## 2020-05-27 ENCOUNTER — Other Ambulatory Visit: Payer: Self-pay

## 2020-05-27 DIAGNOSIS — Z7901 Long term (current) use of anticoagulants: Secondary | ICD-10-CM | POA: Diagnosis not present

## 2020-05-27 DIAGNOSIS — L570 Actinic keratosis: Secondary | ICD-10-CM | POA: Diagnosis not present

## 2020-05-27 DIAGNOSIS — L819 Disorder of pigmentation, unspecified: Secondary | ICD-10-CM | POA: Diagnosis not present

## 2020-05-27 LAB — POCT INR: INR: 1.8 — AB (ref 2.0–3.0)

## 2020-05-27 NOTE — Patient Instructions (Signed)
Pre visit review using our clinic review tool, if applicable. No additional management support is needed unless otherwise documented below in the visit note.  Increase dose today to take 2.5mg  then change weekly dose to take 2.5 mg daily except take 1.25 on mg on Tues, Thurs and Sat. Recheck in 3 wks.

## 2020-06-01 ENCOUNTER — Other Ambulatory Visit: Payer: Self-pay

## 2020-06-01 ENCOUNTER — Ambulatory Visit (HOSPITAL_COMMUNITY): Payer: PPO | Attending: Cardiovascular Disease

## 2020-06-01 DIAGNOSIS — I359 Nonrheumatic aortic valve disorder, unspecified: Secondary | ICD-10-CM | POA: Insufficient documentation

## 2020-06-01 LAB — ECHOCARDIOGRAM COMPLETE
AR max vel: 2.87 cm2
AV Area VTI: 2.82 cm2
AV Area mean vel: 2.94 cm2
AV Mean grad: 7 mmHg
AV Peak grad: 14.6 mmHg
Ao pk vel: 1.91 m/s
Area-P 1/2: 4.39 cm2
S' Lateral: 3.6 cm

## 2020-06-01 MED ORDER — PERFLUTREN LIPID MICROSPHERE
1.0000 mL | INTRAVENOUS | Status: AC | PRN
  Administered 2020-06-01: 1 mL via INTRAVENOUS

## 2020-06-04 ENCOUNTER — Ambulatory Visit: Payer: PPO | Admitting: Cardiovascular Disease

## 2020-06-04 ENCOUNTER — Other Ambulatory Visit: Payer: Self-pay

## 2020-06-04 ENCOUNTER — Encounter: Payer: Self-pay | Admitting: Cardiovascular Disease

## 2020-06-04 ENCOUNTER — Telehealth: Payer: Self-pay

## 2020-06-04 VITALS — BP 144/100 | HR 74 | Ht 72.0 in | Wt 191.2 lb

## 2020-06-04 DIAGNOSIS — I10 Essential (primary) hypertension: Secondary | ICD-10-CM | POA: Diagnosis not present

## 2020-06-04 DIAGNOSIS — E782 Mixed hyperlipidemia: Secondary | ICD-10-CM | POA: Diagnosis not present

## 2020-06-04 DIAGNOSIS — I5032 Chronic diastolic (congestive) heart failure: Secondary | ICD-10-CM | POA: Diagnosis not present

## 2020-06-04 DIAGNOSIS — I359 Nonrheumatic aortic valve disorder, unspecified: Secondary | ICD-10-CM | POA: Diagnosis not present

## 2020-06-04 DIAGNOSIS — I480 Paroxysmal atrial fibrillation: Secondary | ICD-10-CM | POA: Diagnosis not present

## 2020-06-04 MED ORDER — LISINOPRIL 10 MG PO TABS: 10.0000 mg | ORAL_TABLET | Freq: Every day | ORAL | 3 refills | Status: DC

## 2020-06-04 MED ORDER — APIXABAN 5 MG PO TABS: 5.0000 mg | ORAL_TABLET | Freq: Two times a day (BID) | ORAL | 0 refills | Status: DC

## 2020-06-04 NOTE — Progress Notes (Signed)
Cardiology Office Note:    Date:  06/04/2020   ID:  Bill [name], DOB [birth year], MRN [id number]  PCP:  [name], MD  [facility] Cardiologist:  [name], MD  [place] Electrophysiologist:  None   Referring MD: [name], MD   Chief Complaint  Patient presents with  . Hypertension    History of Present Illness:    Bill [name] is a 80 y.o. male with a hx of aortic valve disease status post bioprosthetic aortic valve replacement, persistent atrial fibrillation status post Maze procedure and left atrial appendage clip, and chronic diastolic heart failure.  The patient is here alone today.  He has remained on warfarin but has had to make a lot of dose adjustments over recent months.  He was noted to have persistent atrial fibrillation on dofetilide and this was discontinued previously.  The patient reports stable symptoms from a cardiovascular perspective.  He denies any functional limitation from shortness of breath.  He denies orthopnea, PND, or leg swelling.  He has been evaluated by neurology for Parkinson's symptoms and is felt to have a clinical syndrome consistent with mild Parkinson's disease.  He has lost a great deal of weight over the last 5 years and feels that this is helped his diabetes and cardiac symptoms.  Past Medical History:  Diagnosis Date  . Aortic stenosis 12/2010   s/p bioprosthetic AVR 9/17 // Echo 6/17: EF 55-60, mean AV 25 mmHg // TEE 6/17: severe AS, AVA 0.93, mean AV 43 mmHg // post op Echo 02/07/16: mod conc LVH, EF 65-70, vigorous LVF, AVR ok, mean 9 mmHg, MAC, trivial MR, mod LAE, trivial pericardial eff  . Chronic diastolic CHF (congestive heart failure) ([place])   . Coronary artery disease    LHC 8/17: pLAD 20, mLAD 30, dLAD 20, mRCA 30, dRCA 20, RPDA 20  . COVID-19 virus infection 06/2019  . Exogenous obesity   . GERD (gastroesophageal reflux disease)    occasional  . History of chicken pox   . Hypertension   .  Kidney stones remote  . Lyme disease 2012   ?titers negative  . Neuromuscular disorder (HCC)    tremor right arm  . Persistent atrial fibrillation (HCC)    Tikosyn // s/p Maze procedure 9/17 // Anticoagulation undesirable 2/2 hx of retinal hemorrhage (worked up for [employer] but referred for Maze at time of AVR 2/2 aortic stenosis)   . PFO (patent foramen ovale)    s/p PFO closure at time of AVR in 9/17  . S/P aortic valve replacement with bioprosthetic valve 01/27/2016   27 mm [facility] Ease bovine pericardial tissue valve  . S/P Maze operation for atrial fibrillation 01/27/2016   Complete bilateral atrial lesion set using bipolar radiofrequency and cryothermy ablation with clipping of LA appendage  . Type 2 diabetes, controlled, with retinopathy ([place]) 1980s  . Urge incontinence   . Vitamin B12 deficiency    IF normal (2014)    Past Surgical History:  Procedure Laterality Date  . AORTIC VALVE REPLACEMENT N/A 01/27/2016   Procedure: AORTIC VALVE REPLACEMENT (AVR);  Surgeon: Rexene Alberts, MD;  Location: [place];  Service: Open Heart Surgery;  Laterality: N/A;  . BACK SURGERY  20 yrs ago   removal of lower back cartilage due to damage. Patient does not remember date of surgery.  Marland Kitchen CARDIAC CATHETERIZATION N/A 01/05/2016   Procedure: Right/Left Heart Cath and Coronary Angiography;  Surgeon: Burnell Blanks, MD;  Location: [place]  CV LAB;  Service: Cardiovascular;  Laterality: N/A;  . CARDIOVERSION N/A 02/25/2015   Procedure: CARDIOVERSION;  Surgeon: Skeet Latch, MD;  Location[facility] ENDOSCOPY;  Service: Cardiovascular;  Laterality: N/A;  . CATARACT EXTRACTION Bilateral 2015   [place] [facility] [place]  . COLONOSCOPY    . EYE SURGERY     left eye "cleaned"  . finger sx    . kidney stone removal    . MAZE N/A 01/27/2016   Procedure: MAZE;  Surgeon: Rexene Alberts, MD;  Location: [place];  Service: Open Heart Surgery;  Laterality: N/A;  . TEE WITHOUT CARDIOVERSION N/A 11/05/2015    Procedure: TRANSESOPHAGEAL ECHOCARDIOGRAM (TEE);  Surgeon: Pixie Casino, MD;  Location: [place];  Service: Cardiovascular;  Laterality: N/A;  . TEE WITHOUT CARDIOVERSION N/A 01/27/2016   Procedure: TRANSESOPHAGEAL ECHOCARDIOGRAM (TEE);  Surgeon: Rexene Alberts, MD;  Location: [place];  Service: Open Heart Surgery;  Laterality: N/A;  . TOTAL HIP ARTHROPLASTY  03/12/2012   RIGHT TOTAL HIP ARTHROPLASTY ANTERIOR APPROACH;  Surgeon: Mcarthur Rossetti, MD;  . US ECHOCARDIOGRAPHY  12/2010   EF 50-55%, mild LVH, normal wall motion, high LV filling pressures, mild AS, LA mildly dilated  . VASECTOMY     1978?    Current Medications: Current Meds  Medication Sig  . cholecalciferol (VITAMIN D) 1000 UNITS tablet Take 1,000 Units by mouth as directed.   Marland Kitchen glucose blood (ONE TOUCH ULTRA TEST) test strip Use as directed to check sugar once daily.  Dx code:  E11.319  . ibuprofen (ADVIL) 200 MG tablet Take 200 mg by mouth every 6 (six) hours as needed.  Marland Kitchen lisinopril (ZESTRIL) 2.5 MG tablet TAKE 1 TABLET BY MOUTH EVERY DAY  . metFORMIN (GLUCOPHAGE) 500 MG tablet TAKE 1 TABLET (500 MG TOTAL) BY MOUTH 2 (TWO) TIMES DAILY WITH A MEAL.  . Multiple Vitamin (MULTIVITAMIN WITH MINERALS) TABS tablet Take by mouth daily. 1 packet of 5 vitamins daily  . multivitamin-lutein (OCUVITE-LUTEIN) CAPS capsule Take 3 capsules by mouth as directed.   Marland Kitchen oxybutynin (DITROPAN) 5 MG tablet TAKE 1 TABLET (5 MG TOTAL) BY MOUTH 3 (THREE) TIMES DAILY.  . pravastatin (PRAVACHOL) 20 MG tablet TAKE 1 TABLET BY MOUTH EVERY DAY IN THE EVENING  . [DISCONTINUED] warfarin (COUMADIN) 2.5 MG tablet Take 1 tablet (2.5 mg total) by mouth daily at 6 PM. Or as directed.     Allergies:   No known allergies   Social History   Socioeconomic History  . Marital status: Married    Spouse name: Not on file  . Number of children: Not on file  . Years of education: Not on file  . Highest education level: Not on file  Occupational History   . Not on file  Tobacco Use  . Smoking status: Never Smoker  . Smokeless tobacco: Never Used  Vaping Use  . Vaping Use: Never used  Substance and Sexual Activity  . Alcohol use: Yes    Comment: Regular (bourbon and coke 2-3/day)  . Drug use: No  . Sexual activity: Never  Other Topics Concern  . Not on file  Social History Narrative   Caffeine: coffee 1 cup/day   Lives with wife [name]), 1 cat   Occupation: retired, worked for [employer]   Edu: BS   Activity: no regular exercise   Diet: some water, fruits/vegetables daily      Advanced directives: has living will at home. Does not want prolonged life support "no extraordinary measures" but would accept  temporary measures. Would want wife to be HCPOA.    Social Determinants of Health   Financial Resource Strain: Not on file  Food Insecurity: Not on file  Transportation Needs: Not on file  Physical Activity: Not on file  Stress: Not on file  Social Connections: Not on file     Family History: The patient's family history includes Cancer in his brother; Diabetes in his father and mother; Heart disease in his father and mother.  ROS:   Please see the history of present illness.    Positive for cold hands and cold feet.  All other systems reviewed and are negative.  EKGs/Labs/Other Studies Reviewed:    The following studies were reviewed today: Echo 06-01-2020: IMPRESSIONS    1. 27 mm magna ease bioprosthetic valve in aortic position. Vmax 1.9 m/s,  MG 7.0 mmHG, EOA 2.82 cm2, DI 0.62. No regurgitation. The aortic valve has  been repaired/replaced. Aortic valve regurgitation is not visualized.  There is a 27 mm Magna Ease valve  present in the aortic position. Procedure Date: 01/27/2016. Echo findings  are consistent with normal structure and function of the aortic valve  prosthesis.  2. Right ventricular systolic function is mildly reduced. The right  ventricular size is moderately enlarged. There is moderately elevated   pulmonary artery systolic pressure. The estimated right ventricular  systolic pressure is 33.5 mmHg.  3. Small L pleural effusion.  4. Left ventricular ejection fraction, by estimation, is 55 to 60%. The  left ventricle has normal function. The left ventricle has no regional  wall motion abnormalities. Left ventricular diastolic parameters are  indeterminate.  5. Left atrial size was mildly dilated.  6. Right atrial size was mild to moderately dilated.  7. The mitral valve is degenerative. Mild mitral valve regurgitation. No  evidence of mitral stenosis.  8. The inferior vena cava is dilated in size with <50% respiratory  variability, suggesting right atrial pressure of 15 mmHg.   Comparison(s): Changes from prior study are noted. Stable gradients across  AoV. RV mild to moderately dilated. Moderately elevated RVSP. Small L  pleural effusion still present.   EKG:  EKG is ordered today.  The ekg ordered today demonstrates atrial fibrillation 74 bpm, competing junctional pacemaker, age-indeterminate anteroseptal MI  Recent Labs: 11/14/2019: ALT 13; BUN 22; Creatinine, Ser 1.07; Hemoglobin 11.9; Platelets 217.0; Potassium 5.0; Sodium 135; TSH 3.45  Recent Lipid Panel    Component Value Date/Time   CHOL 132 11/14/2019 1103   TRIG 44.0 11/14/2019 1103   TRIG 71 04/17/2006 1259   HDL 62.50 11/14/2019 1103   CHOLHDL 2 11/14/2019 1103   VLDL 8.8 11/14/2019 1103   LDLCALC 61 11/14/2019 1103   LDLDIRECT 124.4 12/31/2012 0739     Risk Assessment/Calculations:     CHA2DS2-VASc Score = 5  This indicates a 7.2% annual risk of stroke. The patient's score is based upon: CHF History: Yes HTN History: Yes Diabetes History: Yes Stroke History: No Vascular Disease History: No Age Score: 2 Gender Score: 0      Physical Exam:    VS:  BP (!) 144/100 Comment: right arm bp 130/70  Pulse 74   Ht 6' (1.829 m)   Wt 191 lb 3.2 oz (86.7 kg)   SpO2 99%   BMI 25.93 kg/m     Wt  Readings from Last 3 Encounters:  06/04/20 191 lb 3.2 oz (86.7 kg)  01/20/20 193 lb 5 oz (87.7 kg)  12/16/19 189 lb 3.2 oz (85.8 kg)  GEN:  Well nourished, well developed elderly male in no acute distress HEENT: Normal NECK: No JVD; No carotid bruits LYMPHATICS: No lymphadenopathy CARDIAC: Irregular with grade 2/6 systolic ejection murmur at the right upper sternal border RESPIRATORY:  Clear to auscultation without rales, wheezing or rhonchi  ABDOMEN: Soft, non-tender, non-distended MUSCULOSKELETAL:  No edema; No deformity  SKIN: Warm and dry NEUROLOGIC:  Alert and oriented x 3 PSYCHIATRIC:  Normal affect   ASSESSMENT:    1. Paroxysmal atrial fibrillation (HCC)   2. Aortic valve disease   3. Chronic diastolic heart failure ([place])   4. Mixed hyperlipidemia   5. Essential hypertension    PLAN:    In order of problems listed above:  1. We discussed warfarin versus direct oral anticoagulant drugs.  He would prefer to switch to a direct oral anticoagulant.  He is far enough out from bioprosthetic aortic valve replacement at this would certainly be appropriate.  We will change him to apixaban 5 mg twice daily.  He remains stable. 2. Normal bioprosthetic valve function based on his recent echocardiogram.  I reviewed the echo findings with him today.  He should follow lifelong SBE prophylaxis when indicated. 3. Functional class II symptoms noted.  Overall doing well with no evidence of volume excess on exam.  No major limitation at this point. 4. Treated with pravastatin.  Last LDL cholesterol 61 mg/dL, at goal. 5. Blood pressure is uncontrolled.  I repeated his blood pressure during the visit and obtained a reading of 168/80.  Advised to increase lisinopril from 2.5 to 10 mg daily.  Advised to check blood pressure on his home cuff twice weekly and call us with results.        Medication Adjustments/Labs and Tests Ordered: Current medicines are reviewed at length with the patient  today.  Concerns regarding medicines are outlined above.  No orders of the defined types were placed in this encounter.  No orders of the defined types were placed in this encounter.   There are no Patient Instructions on file for this visit.   Signed, [name], MD  06/04/2020 9:46 AM    [place]

## 2020-06-04 NOTE — Telephone Encounter (Signed)
The patient was in to see Dr. [name] today. Plan to switch from Coumadin to Eliquis 5 mg BID.  INR, BMET and CBC updated this AM and the patient was informed he would be called this afternoon with results and instructions with when to transition. Since INR has not resulted yet, called and instructed the patient to continue Coumadin as directed over the weekend and he will be called on Monday with results and instructions. He was grateful for call and agrees with plan.

## 2020-06-04 NOTE — Patient Instructions (Addendum)
Medication Instructions:  1) INCREASE LISINOPRIL to 10 mg daily 2) STOP WARFARIN 3) START ELIQUIS 5 mg twice daily 4) STOP ibuprofen. Take Tylenol PM at night instead. *If you need a refill on your cardiac medications before your next appointment, please call your pharmacy*  Lab Work: TODAY! BMET, CBC If you have labs (blood work) drawn today and your tests are completely normal, you will receive your results only by: Marland Kitchen MyChart Message (if you have MyChart) OR . A paper copy in the mail If you have any lab test that is abnormal or we need to change your treatment, we will call you to review the results.  Follow-Up: At [facility], you and your health needs are our priority.  As part of our continuing mission to provide you with exceptional heart care, we have created designated Provider Care Teams.  These Care Teams include your primary Cardiologist (physician) and Advanced Practice Providers (APPs -  Physician Assistants and Nurse Practitioners) who all work together to provide you with the care you need, when you need it. Your next appointment:   12 month(s) The format for your next appointment:   In Person Provider:   You may see [name], MD or one of the following Advanced Practice Providers on your designated Care Team:    Richardson Dopp, PA-C  Robbie Lis, [place]   Please monitor your blood pressure and let us know if it is consistently over 140/80.

## 2020-06-05 LAB — CBC WITH DIFFERENTIAL/PLATELET
Basophils Absolute: 0 10*3/uL (ref 0.0–0.2)
Basos: 1 %
EOS (ABSOLUTE): 0.1 10*3/uL (ref 0.0–0.4)
Eos: 3 %
Hematocrit: 34.4 % — ABNORMAL LOW (ref 37.5–51.0)
Hemoglobin: 11.2 g/dL — ABNORMAL LOW (ref 13.0–17.7)
Immature Grans (Abs): 0 10*3/uL (ref 0.0–0.1)
Immature Granulocytes: 0 %
Lymphocytes Absolute: 0.6 10*3/uL — ABNORMAL LOW (ref 0.7–3.1)
Lymphs: 12 %
MCH: 30.4 pg (ref 26.6–33.0)
MCHC: 32.6 g/dL (ref 31.5–35.7)
MCV: 94 fL (ref 79–97)
Monocytes Absolute: 0.6 10*3/uL (ref 0.1–0.9)
Monocytes: 12 %
Neutrophils Absolute: 3.5 10*3/uL (ref 1.4–7.0)
Neutrophils: 72 %
Platelets: 228 10*3/uL (ref 150–450)
RBC: 3.68 x10E6/uL — ABNORMAL LOW (ref 4.14–5.80)
RDW: 14 % (ref 11.6–15.4)
WBC: 4.7 10*3/uL (ref 3.4–10.8)

## 2020-06-05 LAB — BASIC METABOLIC PANEL
BUN/Creatinine Ratio: 18 (ref 10–24)
BUN: 20 mg/dL (ref 8–27)
CO2: 24 mmol/L (ref 20–29)
Calcium: 10.4 mg/dL — ABNORMAL HIGH (ref 8.6–10.2)
Chloride: 102 mmol/L (ref 96–106)
Creatinine, Ser: 1.12 mg/dL (ref 0.76–1.27)
GFR calc Af Amer: 71 mL/min/{1.73_m2} (ref >59)
GFR calc non Af Amer: 61 mL/min/{1.73_m2} (ref >59)
Glucose: 107 mg/dL — ABNORMAL HIGH (ref 65–99)
Potassium: 5 mmol/L (ref 3.5–5.2)
Sodium: 139 mmol/L (ref 134–144)

## 2020-06-05 LAB — PROTIME-INR
INR: 1.9 — ABNORMAL HIGH (ref 0.9–1.2)
Prothrombin Time: 19.9 s — ABNORMAL HIGH (ref 9.1–12.0)

## 2020-06-07 NOTE — Telephone Encounter (Signed)
Spoke with patient, will start Eliquis 5 mg bid this evening.  Understands to stop warfarin and I will cancel his future coumadin clinic appt.

## 2020-06-07 NOTE — Telephone Encounter (Signed)
INR 1.9.  Patient can be switched to Eliquis 5mg  BID

## 2020-06-10 ENCOUNTER — Ambulatory Visit: Payer: PPO | Admitting: Cardiovascular Disease

## 2020-06-17 ENCOUNTER — Telehealth: Payer: Self-pay

## 2020-06-17 ENCOUNTER — Ambulatory Visit: Payer: Self-pay

## 2020-06-17 ENCOUNTER — Ambulatory Visit: Payer: PPO

## 2020-06-17 NOTE — Telephone Encounter (Signed)
-----   Message from Rockne Menghini, [place] sent at 06/07/2020 11:39 AM EST ----- Hi  Just wanted to let you know that we switched Bill [name] to [employer] as of today.  I cancelled his next INR check in your office.  Thanks and have a good day.  Tommy Medal PharmD  [place]

## 2020-06-17 NOTE — Progress Notes (Signed)
Pt has been transferred to Eliquis and is no longer taking coumadin.  Ending coumadin episode

## 2020-06-17 NOTE — Telephone Encounter (Signed)
Noted. Coumadin episode has been resolved.

## 2020-06-18 DIAGNOSIS — H401232 Low-tension glaucoma, bilateral, moderate stage: Secondary | ICD-10-CM | POA: Diagnosis not present

## 2020-06-24 ENCOUNTER — Ambulatory Visit: Payer: PPO

## 2020-07-15 ENCOUNTER — Telehealth: Payer: Self-pay | Admitting: Cardiovascular Disease

## 2020-07-15 MED ORDER — APIXABAN 5 MG PO TABS: 5.0000 mg | ORAL_TABLET | Freq: Two times a day (BID) | ORAL | 1 refills | Status: DC

## 2020-07-15 NOTE — Telephone Encounter (Signed)
Pt c/o medication issue:  1. Name of Medication: apixaban (ELIQUIS) 5 MG TABS tablet  2. How are you currently taking this medication (dosage and times per day)? 1 tablet twice a day  3. Are you having a reaction (difficulty breathing--STAT)? no  4. What is your medication issue? Patient states he was given a 30 day free coupon for eliquis, but the company won't honor it. He states they told him he has used it before, but he does not remember ever taking this medication in the past. He states he does like the medication and would like it sent to [facility] in [place].

## 2020-07-19 ENCOUNTER — Ambulatory Visit: Payer: PPO | Admitting: Neurology

## 2020-07-21 ENCOUNTER — Other Ambulatory Visit: Payer: Self-pay | Admitting: Family Medicine

## 2020-07-21 DIAGNOSIS — E785 Hyperlipidemia, unspecified: Secondary | ICD-10-CM

## 2020-07-21 DIAGNOSIS — I2510 Atherosclerotic heart disease of native coronary artery without angina pectoris: Secondary | ICD-10-CM

## 2020-11-30 DIAGNOSIS — L570 Actinic keratosis: Secondary | ICD-10-CM | POA: Diagnosis not present

## 2020-11-30 DIAGNOSIS — D229 Melanocytic nevi, unspecified: Secondary | ICD-10-CM | POA: Diagnosis not present

## 2020-11-30 DIAGNOSIS — L819 Disorder of pigmentation, unspecified: Secondary | ICD-10-CM | POA: Diagnosis not present

## 2020-11-30 DIAGNOSIS — L821 Other seborrheic keratosis: Secondary | ICD-10-CM | POA: Diagnosis not present

## 2020-11-30 DIAGNOSIS — L814 Other melanin hyperpigmentation: Secondary | ICD-10-CM | POA: Diagnosis not present

## 2020-11-30 DIAGNOSIS — D692 Other nonthrombocytopenic purpura: Secondary | ICD-10-CM | POA: Diagnosis not present

## 2020-12-17 DIAGNOSIS — Z9841 Cataract extraction status, right eye: Secondary | ICD-10-CM | POA: Diagnosis not present

## 2020-12-17 DIAGNOSIS — H52223 Regular astigmatism, bilateral: Secondary | ICD-10-CM | POA: Diagnosis not present

## 2020-12-17 DIAGNOSIS — Z9842 Cataract extraction status, left eye: Secondary | ICD-10-CM | POA: Diagnosis not present

## 2020-12-17 DIAGNOSIS — E113553 Type 2 diabetes mellitus with stable proliferative diabetic retinopathy, bilateral: Secondary | ICD-10-CM | POA: Diagnosis not present

## 2020-12-17 DIAGNOSIS — H401232 Low-tension glaucoma, bilateral, moderate stage: Secondary | ICD-10-CM | POA: Diagnosis not present

## 2020-12-17 LAB — HM DIABETES EYE EXAM

## 2020-12-31 ENCOUNTER — Encounter: Payer: Self-pay | Admitting: Family Medicine

## 2021-01-18 ENCOUNTER — Telehealth: Payer: Self-pay | Admitting: Family Medicine

## 2021-01-18 DIAGNOSIS — I2510 Atherosclerotic heart disease of native coronary artery without angina pectoris: Secondary | ICD-10-CM

## 2021-01-18 DIAGNOSIS — E785 Hyperlipidemia, unspecified: Secondary | ICD-10-CM

## 2021-01-19 ENCOUNTER — Other Ambulatory Visit: Payer: Self-pay | Admitting: Family Medicine

## 2021-01-19 NOTE — Telephone Encounter (Signed)
E-scribed refill.  Plz schedule wellness, lab and cpe visits.  

## 2021-01-20 NOTE — Telephone Encounter (Signed)
Noted  

## 2021-01-20 NOTE — Telephone Encounter (Signed)
Spoke with patient schedule MWV with nurse ,CPE with [facility] prior

## 2021-01-27 ENCOUNTER — Telehealth: Payer: Self-pay | Admitting: Family Medicine

## 2021-01-27 DIAGNOSIS — Z0279 Encounter for issue of other medical certificate: Secondary | ICD-10-CM

## 2021-01-27 NOTE — Telephone Encounter (Signed)
Pt came into office to drop off physician forms. Placed in provider box. Pt said they need to be faxed

## 2021-01-27 NOTE — Telephone Encounter (Signed)
Pt's last AWV was 11/14/19.  Placed form in Dr. Synthia Innocent box.

## 2021-01-31 NOTE — Telephone Encounter (Signed)
Filled and in Lisa's box.  Seems they will need this faxed to [facility] at [phone].

## 2021-02-02 NOTE — Telephone Encounter (Addendum)
Faxed form.  Spoke with pt notifying him form was faxed and there is a $29.00 fee to have both (his and wife's) forms completed.  Also, informed him his original form is being mailed to him for his records.  Pt verbalizes understanding and expresses his thanks.

## 2021-02-17 ENCOUNTER — Telehealth: Payer: Self-pay

## 2021-02-17 ENCOUNTER — Ambulatory Visit
Admission: RE | Admit: 2021-02-17 | Discharge: 2021-02-17 | Disposition: A | Discharge status: Home / Self Care | Payer: PPO | Source: Ambulatory Visit | Attending: Family | Admitting: Family

## 2021-02-17 ENCOUNTER — Other Ambulatory Visit: Payer: Self-pay

## 2021-02-17 VITALS — BP 171/83 | HR 80 | Temp 97.9°F | Resp 18

## 2021-02-17 DIAGNOSIS — R3915 Urgency of urination: Secondary | ICD-10-CM | POA: Diagnosis not present

## 2021-02-17 DIAGNOSIS — J029 Acute pharyngitis, unspecified: Secondary | ICD-10-CM | POA: Diagnosis not present

## 2021-02-17 DIAGNOSIS — R350 Frequency of micturition: Secondary | ICD-10-CM

## 2021-02-17 LAB — POCT URINALYSIS DIP (MANUAL ENTRY)
Bilirubin, UA: NEGATIVE
Glucose, UA: NEGATIVE mg/dL
Ketones, POC UA: NEGATIVE mg/dL
Nitrite, UA: POSITIVE — AB
Protein Ur, POC: 100 mg/dL — AB
Spec Grav, UA: 1.015 (ref 1.010–1.025)
Urobilinogen, UA: 1 E.U./dL
pH, UA: 5.5 (ref 5.0–8.0)

## 2021-02-17 MED ORDER — LIDOCAINE VISCOUS HCL 2 % MT SOLN: 10.0000 mL | OROMUCOSAL | 0 refills | Status: AC | PRN

## 2021-02-17 MED ORDER — CEPHALEXIN 500 MG PO CAPS: 500.0000 mg | ORAL_CAPSULE | Freq: Two times a day (BID) | ORAL | 0 refills | Status: AC

## 2021-02-17 NOTE — Telephone Encounter (Signed)
Noted thank you

## 2021-02-17 NOTE — ED Triage Notes (Signed)
Pt here with sore throat since this morning and urinary urgency and increased frequency for about a week. States it does not really burn, but feels uncomfortable.

## 2021-02-17 NOTE — Discharge Instructions (Addendum)
Recommend start Keflex 500mg  twice a day as directed for urinary tract infection. Continue to push fluids. May use Viscous lidocaine 2 teaspoons swish and spit out every 3 hours as needed for mouth/throat pain. Recommend follow-up pending urine culture results.

## 2021-02-17 NOTE — Telephone Encounter (Signed)
Pt said starting this morning at 3 AM developed very S/T with difficulty in swallowing anything. Pt is not having any difficulty breathing and throat is not swelling. Pt also has burning upon urination with slight urgency. No abd or new back pain (pt has had back pain for yrs.) no urinary frequency, no blood in urine seen and no fever. I scheduled pt an appt 02/17/21 at 2:15 at [facility] in [place]. UC & ED precautions given and pt voiced understanding. Sending note to Dr Darnell Level.

## 2021-02-17 NOTE — ED Provider Notes (Signed)
Bill [name]    CSN: [id number] Arrival date & time: 02/17/21  1438      History   Chief Complaint Chief Complaint  Patient presents with   Dysuria   Sore Throat    HPI Bill [name] is a 80 y.o. male.   80 year old male presents with sore throat that started hurting last night. Noticed left upper area of throat/mouth was painful. Denies any fever or cough. Slight runny nose. Also having chills, urinary urgency, frequency for over 1 week. Discomfort when urinating but not burning. Has history of urinary incontinence and recurrent UTI. Has taken Tylenol with minimal relief. Other chronic health issues include HTN, CAD, CHF, A fib, multiple heart issues, hyperlipidemia, GERD, type 2 DM, arthritis, and Resting tremor. Currently on Lisinopril, Pravachol, Eliquis, Metformin, and Ditropan daily. Recent visits from PCP and Neurologist reviewed.   The history is provided by the patient.   Past Medical History:  Diagnosis Date   Aortic stenosis 12/2010   s/p bioprosthetic AVR 9/17 // Echo 6/17: EF 55-60, mean AV 25 mmHg // TEE 6/17: severe AS, AVA 0.93, mean AV 43 mmHg // post op Echo 02/07/16: mod conc LVH, EF 65-70, vigorous LVF, AVR ok, mean 9 mmHg, MAC, trivial MR, mod LAE, trivial pericardial eff   Chronic diastolic CHF (congestive heart failure) ([place])    Coronary artery disease    LHC 8/17: pLAD 20, mLAD 30, dLAD 20, mRCA 30, dRCA 20, RPDA 20   COVID-19 virus infection 06/2019   Exogenous obesity    GERD (gastroesophageal reflux disease)    occasional   History of chicken pox    Hypertension    Kidney stones remote   Lyme disease 2012   ?titers negative   Neuromuscular disorder (HCC)    tremor right arm   Persistent atrial fibrillation (HCC)    Tikosyn // s/p Maze procedure 9/17 // Anticoagulation undesirable 2/2 hx of retinal hemorrhage (worked up for [employer] but referred for Maze at time of AVR 2/2 aortic stenosis)    PFO (patent foramen ovale)    s/p PFO  closure at time of AVR in 9/17   S/P aortic valve replacement with bioprosthetic valve 01/27/2016   27 mm [facility] Ease bovine pericardial tissue valve   S/P Maze operation for atrial fibrillation 01/27/2016   Complete bilateral atrial lesion set using bipolar radiofrequency and cryothermy ablation with clipping of LA appendage   Type 2 diabetes, controlled, with retinopathy ([place]) 1980s   Urge incontinence    Vitamin B12 deficiency    IF normal (2014)    Patient Active Problem List   Diagnosis Date Noted   Constipation 03/27/2019   Diabetic peripheral vascular disease ([place]) 09/14/2016   Coronary artery disease involving native coronary artery of native heart without angina pectoris 09/14/2016   Dysphagia 03/13/2016   Encounter for therapeutic drug monitoring 02/07/2016   S/P aortic valve replacement with bioprosthetic valve + maze procedure 01/27/2016   S/P Maze operation for atrial fibrillation 01/27/2016   Severe aortic stenosis    Health maintenance examination 09/14/2015   Diabetic peripheral neuropathy associated with type 2 diabetes mellitus ([place]) 02/19/2015   Advanced care planning/counseling discussion 09/15/2014   Resting tremor 09/15/2014   Hyperkalemia 09/15/2014   Perichondritis of left ear 02/26/2014   Chronic diastolic heart failure ([place]) 10/16/2013   CKD stage 2 due to type 2 diabetes mellitus ([place]) 05/15/2013   Metatarsalgia of right foot 05/09/2013   Sleep disorder 09/06/2012  Vitamin B12 deficiency 09/05/2012   Degenerative arthritis of hip 03/12/2012   Atrial fibrillation ([place]) 03/11/2012   Medicare annual wellness visit, subsequent 08/23/2011   Chronic lower back pain 08/23/2011   Urge incontinence    Vitamin D deficiency 06/03/2008   Overweight (BMI 25.0-29.9) 06/03/2008   False positive serological test for syphilis 06/03/2008   BPH (benign prostatic hyperplasia) 06/03/2008   Controlled type 2 diabetes mellitus with proliferative retinopathy ([place])  04/23/2007   HLD (hyperlipidemia) 04/23/2007   Essential hypertension 12/03/2006    Past Surgical History:  Procedure Laterality Date   AORTIC VALVE REPLACEMENT N/A 01/27/2016   Procedure: AORTIC VALVE REPLACEMENT (AVR);  Surgeon: Rexene Alberts, MD;  Location: [place];  Service: Open Heart Surgery;  Laterality: N/A;   BACK SURGERY  20 yrs ago   removal of lower back cartilage due to damage. Patient does not remember date of surgery.   CARDIAC CATHETERIZATION N/A 01/05/2016   Procedure: Right/Left Heart Cath and Coronary Angiography;  Surgeon: Burnell Blanks, MD;  Location: [place] [facility];  Service: Cardiovascular;  Laterality: N/A;   CARDIOVERSION N/A 02/25/2015   Procedure: CARDIOVERSION;  Surgeon: Skeet Latch, MD;  Location: [facility];  Service: Cardiovascular;  Laterality: N/A;   CATARACT EXTRACTION Bilateral 2015   [facility]   COLONOSCOPY     EYE SURGERY     left eye "cleaned"   finger sx     kidney stone removal     MAZE N/A 01/27/2016   Procedure: MAZE;  Surgeon: Rexene Alberts, MD;  Location: [place];  Service: Open Heart Surgery;  Laterality: N/A;   TEE WITHOUT CARDIOVERSION N/A 11/05/2015   Procedure: TRANSESOPHAGEAL ECHOCARDIOGRAM (TEE);  Surgeon: Pixie Casino, MD;  Location: [facility] ENDOSCOPY;  Service: Cardiovascular;  Laterality: N/A;   TEE WITHOUT CARDIOVERSION N/A 01/27/2016   Procedure: TRANSESOPHAGEAL ECHOCARDIOGRAM (TEE);  Surgeon: Rexene Alberts, MD;  Location: [place];  Service: Open Heart Surgery;  Laterality: N/A;   TOTAL HIP ARTHROPLASTY  03/12/2012   RIGHT TOTAL HIP ARTHROPLASTY ANTERIOR APPROACH;  Surgeon: Mcarthur Rossetti, MD;   US ECHOCARDIOGRAPHY  12/2010   EF 50-55%, mild LVH, normal wall motion, high LV filling pressures, mild AS, LA mildly dilated   VASECTOMY     1978?       Home Medications    Prior to Admission medications   Medication Sig Start Date End Date Taking? Authorizing Provider  apixaban (ELIQUIS) 5 MG TABS  tablet Take 1 tablet (5 mg total) by mouth 2 (two) times daily. 07/15/20  Yes [name], MD  cephALEXin (KEFLEX) 500 MG capsule Take 1 capsule (500 mg total) by mouth 2 (two) times daily for 7 days. 02/17/21 02/24/21 Yes Jesseca Marsch, Nicholes Stairs, NP  cholecalciferol (VITAMIN D) 1000 UNITS tablet Take 1,000 Units by mouth as directed.    Yes [provider]  glucose blood (ONE TOUCH ULTRA TEST) test strip Use as directed to check sugar once daily.  Dx code:  E11.319 02/04/20  Yes [name], MD  lidocaine (XYLOCAINE) 2 % solution Use as directed 10 mLs in the mouth or throat every 3 (three) hours as needed for mouth pain. Swish and Spit out- do not swallow 02/17/21  Yes Muzamil Harker, Nicholes Stairs, NP  lisinopril (ZESTRIL) 10 MG tablet Take 1 tablet (10 mg total) by mouth daily. 06/04/20 05/30/21 Yes [name], MD  metFORMIN (GLUCOPHAGE) 500 MG tablet TAKE 1 TABLET BY MOUTH 2 TIMES DAILY WITH A MEAL. 01/19/21  Yes [name], MD  Multiple Vitamin (MULTIVITAMIN WITH MINERALS) TABS tablet Take by mouth daily. 1 packet of 5 vitamins daily   Yes [provider]  multivitamin-lutein (OCUVITE-LUTEIN) CAPS capsule Take 3 capsules by mouth as directed.    Yes [provider]  oxybutynin (DITROPAN) 5 MG tablet TAKE 1 TABLET (5 MG TOTAL) BY MOUTH 3 (THREE) TIMES DAILY. 01/09/20  Yes [name], MD  pravastatin (PRAVACHOL) 20 MG tablet TAKE 1 TABLET BY MOUTH EVERY DAY IN THE EVENING 01/19/21  Yes [name], MD    Family History Family History  Problem Relation Age of Onset   Diabetes Mother    Heart disease Mother    Diabetes Father    Heart disease Father    Cancer Brother        throat    Social History Social History   Tobacco Use   Smoking status: Never   Smokeless tobacco: Never  Vaping Use   Vaping Use: Never used  Substance Use Topics   Alcohol use: Yes    Comment: Regular (bourbon and coke 2-3/day)   Drug use: No     Allergies   No known  allergies   Review of Systems Review of Systems  Constitutional:  Positive for appetite change, chills and fatigue. Negative for diaphoresis and fever.  HENT:  Positive for postnasal drip and sore throat. Negative for congestion, ear discharge, ear pain, facial swelling, mouth sores, nosebleeds, sinus pressure, sinus pain and trouble swallowing.   Eyes:  Negative for pain, discharge, redness and itching.  Respiratory:  Negative for cough, chest tightness and shortness of breath.   Gastrointestinal:  Negative for nausea and vomiting.  Musculoskeletal:  Positive for arthralgias and back pain. Negative for neck pain and neck stiffness.  Skin:  Negative for color change and rash.  Allergic/Immunologic: Negative for environmental allergies and food allergies.  Neurological:  Positive for tremors and light-headedness. Negative for seizures, syncope, speech difficulty and headaches.  Hematological:  Negative for adenopathy. Bruises/bleeds easily.    Physical Exam Triage Vital Signs ED Triage Vitals  Enc Vitals Group     BP 02/17/21 1454 (!) 171/83     Pulse Rate 02/17/21 1454 80     Resp 02/17/21 1454 18     Temp 02/17/21 1454 97.9 F (36.6 C)     Temp Source 02/17/21 1454 Tympanic     SpO2 02/17/21 1454 97 %     Weight --      Height --      Head Circumference --      Peak Flow --      Pain Score 02/17/21 1500 6     Pain Loc --      Pain Edu? --      Excl. in [place]? --    No data found.  Updated Vital Signs BP (!) 171/83 (BP Location: Left Arm)   Pulse 80   Temp 97.9 F (36.6 C) (Tympanic)   Resp 18   SpO2 97%   Visual Acuity Right Eye Distance:   Left Eye Distance:   Bilateral Distance:    Right Eye Near:   Left Eye Near:    Bilateral Near:     Physical Exam Vitals and nursing note reviewed.  Constitutional:      General: He is awake. He is not in acute distress.    Appearance: He is well-developed and well-groomed.     Comments: He is sitting in the exam chair in  no  acute distress.  HENT:     Head: Normocephalic and atraumatic.     Right Ear: Hearing, tympanic membrane, ear canal and external ear normal.     Left Ear: Hearing, tympanic membrane, ear canal and external ear normal.     Nose: Rhinorrhea present. Rhinorrhea is clear.     Right Sinus: No maxillary sinus tenderness or frontal sinus tenderness.     Left Sinus: No maxillary sinus tenderness or frontal sinus tenderness.     Mouth/Throat:     Lips: Pink.     Mouth: Mucous membranes are moist. Oral lesions present.     Pharynx: Uvula midline. Posterior oropharyngeal erythema present. No pharyngeal swelling, oropharyngeal exudate or uvula swelling.      Comments: 2 small red ulcers present on left posterior soft palate with slight surrounding erythema. No other redness or swelling present. No discharge or exudate.  Eyes:     Extraocular Movements: Extraocular movements intact.     Conjunctiva/sclera: Conjunctivae normal.  Cardiovascular:     Rate and Rhythm: Normal rate. Rhythm irregular.  Pulmonary:     Effort: Pulmonary effort is normal. No respiratory distress.     Breath sounds: Normal breath sounds and air entry. No decreased air movement. No decreased breath sounds, wheezing, rhonchi or rales.  Musculoskeletal:     Cervical back: Normal range of motion and neck supple.  Lymphadenopathy:     Cervical: No cervical adenopathy.  Skin:    General: Skin is warm and dry.     Capillary Refill: Capillary refill takes less than 2 seconds.     Findings: No rash.  Neurological:     General: No focal deficit present.     Mental Status: He is alert and oriented to person, place, and time.     Motor: Tremor present.     Comments: Slow to change positions.   Psychiatric:        Mood and Affect: Mood normal.        Behavior: Behavior normal. Behavior is cooperative.        Thought Content: Thought content normal.        Judgment: Judgment normal.     UC Treatments / Results  Labs (all  labs ordered are listed, but only abnormal results are displayed) Labs Reviewed  POCT URINALYSIS DIP (MANUAL ENTRY) - Abnormal; Notable for the following components:      Result Value   Clarity, UA cloudy (*)    Blood, UA trace-intact (*)    Protein Ur, POC =100 (*)    Nitrite, UA Positive (*)    Leukocytes, UA Large (3+) (*)    All other components within normal limits  URINE CULTURE    EKG   Radiology No results found.  Procedures Procedures (including critical care time)  Medications Ordered in UC Medications - No data to display  Initial Impression / Assessment and Plan / UC Course  I have reviewed the triage vital signs and the nursing notes.  Pertinent labs & imaging results that were available during my care of the patient were reviewed by me and considered in my medical decision making (see chart for details).     Reviewed with patient that he has a few ulcers in the back of his throat- may be a virus or could be caused by trauma from eating sharp foods. Recommend trial Viscous lidocaine 2 teaspoons swish and spit out every 3 to 4 hours as needed. May continue Tylenol 1075m every 8 hours as  needed. Continue to monitor symptoms.  Reviewed urinalysis results with patient- positive WBC's, nitrites, blood and protein which is consistent with UTI. Will send urine for culture and start Keflex 528m twice a day. Continue to push fluids. Follow-up pending urine culture results and with his PCP in 2 to 3 days if not improving.      Final Clinical Impressions(s) / UC Diagnoses   Final diagnoses:  Increased urinary frequency  Urinary urgency  Sore throat     Discharge Instructions      Recommend start Keflex 5055mtwice a day as directed for urinary tract infection. Continue to push fluids. May use Viscous lidocaine 2 teaspoons swish and spit out every 3 hours as needed for mouth/throat pain. Recommend follow-up pending urine culture results.      ED Prescriptions      Medication Sig Dispense Auth. Provider   cephALEXin (KEFLEX) 500 MG capsule Take 1 capsule (500 mg total) by mouth 2 (two) times daily for 7 days. 14 capsule AmKaty ApoNP   lidocaine (XYLOCAINE) 2 % solution Use as directed 10 mLs in the mouth or throat every 3 (three) hours as needed for mouth pain. Swish and Spit out- do not swallow 100 mL Aireal Slater, AnNicholes StairsNP      PDMP not reviewed this encounter.   AmKaty ApoNP 02/18/21 [phone]

## 2021-02-20 ENCOUNTER — Other Ambulatory Visit: Payer: Self-pay | Admitting: Cardiovascular Disease

## 2021-02-20 ENCOUNTER — Other Ambulatory Visit: Payer: Self-pay | Admitting: Family Medicine

## 2021-02-21 ENCOUNTER — Other Ambulatory Visit: Payer: Self-pay | Admitting: Cardiovascular Disease

## 2021-02-21 LAB — URINE CULTURE
Culture: >=100000 — AB
Special Requests: NORMAL

## 2021-02-21 NOTE — Telephone Encounter (Signed)
Prescription refill request for Eliquis received. Indication:Afib  Last office visit: 06/04/20 [name])  Scr: 1.12 (06/04/20)  Age: 80 Weight: 86.7kg  Appropriate dose and refill sent to requested pharmacy.

## 2021-04-19 ENCOUNTER — Encounter: Payer: Self-pay | Admitting: Cardiovascular Disease

## 2021-04-22 ENCOUNTER — Other Ambulatory Visit: Payer: Self-pay | Admitting: Family Medicine

## 2021-04-22 DIAGNOSIS — I2510 Atherosclerotic heart disease of native coronary artery without angina pectoris: Secondary | ICD-10-CM

## 2021-04-22 DIAGNOSIS — E785 Hyperlipidemia, unspecified: Secondary | ICD-10-CM

## 2021-04-23 ENCOUNTER — Other Ambulatory Visit: Payer: Self-pay | Admitting: Family Medicine

## 2021-05-12 ENCOUNTER — Telehealth: Payer: Self-pay | Admitting: Family Medicine

## 2021-05-12 DIAGNOSIS — E113513 Type 2 diabetes mellitus with proliferative diabetic retinopathy with macular edema, bilateral: Secondary | ICD-10-CM

## 2021-05-12 MED ORDER — ONETOUCH ULTRA 2 W/DEVICE KIT: PACK | 0 refills | Status: DC

## 2021-05-12 MED ORDER — GLUCOSE BLOOD VI STRP: ORAL_STRIP | 3 refills | Status: DC

## 2021-05-12 NOTE — Telephone Encounter (Signed)
Printed rxs and placed in Dr. Synthia Innocent box.

## 2021-05-12 NOTE — Telephone Encounter (Signed)
Pt called stating he needs authorization for a One touch Kit with Meter and Strips. Pt would like it sent to [facility] [address] [place] [address]. Please advise.

## 2021-05-13 NOTE — Telephone Encounter (Signed)
Faxed rxs to [place], [place] at [phone].

## 2021-05-13 NOTE — Telephone Encounter (Signed)
Signed and in Lisa's box.

## 2021-05-21 ENCOUNTER — Other Ambulatory Visit: Payer: Self-pay | Admitting: Family Medicine

## 2021-05-23 ENCOUNTER — Other Ambulatory Visit: Payer: Self-pay | Admitting: Family Medicine

## 2021-05-23 DIAGNOSIS — E538 Deficiency of other specified B group vitamins: Secondary | ICD-10-CM

## 2021-05-23 DIAGNOSIS — E782 Mixed hyperlipidemia: Secondary | ICD-10-CM

## 2021-05-23 DIAGNOSIS — N401 Enlarged prostate with lower urinary tract symptoms: Secondary | ICD-10-CM

## 2021-05-23 DIAGNOSIS — I4891 Unspecified atrial fibrillation: Secondary | ICD-10-CM

## 2021-05-23 DIAGNOSIS — E113513 Type 2 diabetes mellitus with proliferative diabetic retinopathy with macular edema, bilateral: Secondary | ICD-10-CM

## 2021-05-23 DIAGNOSIS — E559 Vitamin D deficiency, unspecified: Secondary | ICD-10-CM

## 2021-05-23 DIAGNOSIS — E1122 Type 2 diabetes mellitus with diabetic chronic kidney disease: Secondary | ICD-10-CM

## 2021-05-24 ENCOUNTER — Other Ambulatory Visit: Payer: PPO

## 2021-05-25 ENCOUNTER — Ambulatory Visit: Payer: PPO

## 2021-05-27 ENCOUNTER — Encounter: Payer: Self-pay | Admitting: Family Medicine

## 2021-05-27 ENCOUNTER — Telehealth: Payer: Self-pay | Admitting: Family Medicine

## 2021-05-27 MED ORDER — GLUCOSE BLOOD VI STRP: ORAL_STRIP | 3 refills | Status: DC

## 2021-05-27 NOTE — Telephone Encounter (Signed)
°  Encourage patient to contact the pharmacy for refills or they can request refills through [place]:  Please schedule appointment if longer than 1 year  NEXT APPOINTMENT DATE:no future appt  MEDICATION:one touch verio test strip  Is the patient out of medication?   [place] [id number]  Let patient know to contact pharmacy at the end of the day to make sure medication is ready.  Please notify patient to allow 48-72 hours to process  CLINICAL FILLS OUT ALL BELOW:   LAST REFILL:  QTY:  REFILL DATE:    OTHER COMMENTS:    Okay for refill?  Please advise

## 2021-05-31 ENCOUNTER — Encounter: Payer: PPO | Admitting: Family Medicine

## 2021-06-20 ENCOUNTER — Encounter: Payer: Self-pay | Admitting: Family Medicine

## 2021-06-20 DIAGNOSIS — R2689 Other abnormalities of gait and mobility: Secondary | ICD-10-CM | POA: Insufficient documentation

## 2021-08-04 ENCOUNTER — Encounter: Payer: Self-pay | Admitting: Cardiovascular Disease

## 2021-08-04 NOTE — Progress Notes (Signed)
Attempted to book pt for yearly follow-up appointment. Pt states he has now moved to [place], [place] and follows cardiologist there. Will remove from active recalls. ?

## 2021-08-06 ENCOUNTER — Other Ambulatory Visit: Payer: Self-pay | Admitting: Family Medicine

## 2021-08-06 DIAGNOSIS — I2510 Atherosclerotic heart disease of native coronary artery without angina pectoris: Secondary | ICD-10-CM

## 2021-08-06 DIAGNOSIS — E785 Hyperlipidemia, unspecified: Secondary | ICD-10-CM

## 2021-08-16 ENCOUNTER — Telehealth: Payer: Self-pay | Admitting: Cardiovascular Disease

## 2021-08-16 NOTE — Telephone Encounter (Signed)
Cardiologist in [place]. Would like for dr [name] to give him a call. He say the patient today for Afib. Please advise ?

## 2021-08-17 NOTE — Telephone Encounter (Signed)
Thx - I spoke with him this evening and answered some questions about the patient's history.  ?

## 2021-08-25 ENCOUNTER — Other Ambulatory Visit: Payer: Self-pay | Admitting: Family Medicine

## 2021-08-25 NOTE — Telephone Encounter (Signed)
Pt has moved and has new PCP ?

## 2021-09-04 ENCOUNTER — Other Ambulatory Visit: Payer: Self-pay | Admitting: Family Medicine

## 2021-09-04 DIAGNOSIS — E785 Hyperlipidemia, unspecified: Secondary | ICD-10-CM

## 2021-09-04 DIAGNOSIS — I2510 Atherosclerotic heart disease of native coronary artery without angina pectoris: Secondary | ICD-10-CM

## 2021-09-06 NOTE — Telephone Encounter (Signed)
Spoke to pt. He has moved and has established with a new PCP. ?

## 2021-09-28 ENCOUNTER — Other Ambulatory Visit: Payer: Self-pay | Admitting: Cardiovascular Disease

## 2022-06-09 ENCOUNTER — Other Ambulatory Visit: Payer: Self-pay | Admitting: Family Medicine

## 2022-06-09 DIAGNOSIS — E113513 Type 2 diabetes mellitus with proliferative diabetic retinopathy with macular edema, bilateral: Secondary | ICD-10-CM
